# Patient Record
Sex: Male | Born: 1945 | Race: White | Hispanic: No | Marital: Married | Smoking: Former smoker
Health system: Southern US, Community
[De-identification: ages and names within clinical notes are randomized; demographics above are authoritative.]

## PROBLEM LIST (undated history)

## (undated) VITALS — BP 98/65 | HR 87 | Temp 98.8°F | Resp 18 | Ht 70.0 in | Wt 213.1 lb

## (undated) DIAGNOSIS — I471 Supraventricular tachycardia, unspecified: Secondary | ICD-10-CM

## (undated) DIAGNOSIS — Z951 Presence of aortocoronary bypass graft: Secondary | ICD-10-CM

## (undated) DIAGNOSIS — K219 Gastro-esophageal reflux disease without esophagitis: Secondary | ICD-10-CM

## (undated) DIAGNOSIS — I10 Essential (primary) hypertension: Secondary | ICD-10-CM

## (undated) DIAGNOSIS — E66811 Obesity, class 1: Secondary | ICD-10-CM

## (undated) DIAGNOSIS — M65352 Trigger finger, left little finger: Secondary | ICD-10-CM

## (undated) DIAGNOSIS — K4090 Unilateral inguinal hernia, without obstruction or gangrene, not specified as recurrent: Secondary | ICD-10-CM

## (undated) DIAGNOSIS — E669 Obesity, unspecified: Secondary | ICD-10-CM

## (undated) DIAGNOSIS — T82855A Stenosis of coronary artery stent, initial encounter: Secondary | ICD-10-CM

## (undated) DIAGNOSIS — M722 Plantar fascial fibromatosis: Secondary | ICD-10-CM

## (undated) DIAGNOSIS — I5189 Other ill-defined heart diseases: Secondary | ICD-10-CM

## (undated) DIAGNOSIS — I2119 ST elevation (STEMI) myocardial infarction involving other coronary artery of inferior wall: Secondary | ICD-10-CM

## (undated) DIAGNOSIS — I25810 Atherosclerosis of coronary artery bypass graft(s) without angina pectoris: Secondary | ICD-10-CM

## (undated) DIAGNOSIS — Z9861 Coronary angioplasty status: Secondary | ICD-10-CM

## (undated) DIAGNOSIS — K5792 Diverticulitis of intestine, part unspecified, without perforation or abscess without bleeding: Secondary | ICD-10-CM

## (undated) DIAGNOSIS — Z8661 Personal history of infections of the central nervous system: Secondary | ICD-10-CM

## (undated) DIAGNOSIS — E785 Hyperlipidemia, unspecified: Secondary | ICD-10-CM

## (undated) DIAGNOSIS — Z955 Presence of coronary angioplasty implant and graft: Secondary | ICD-10-CM

## (undated) DIAGNOSIS — I2510 Atherosclerotic heart disease of native coronary artery without angina pectoris: Secondary | ICD-10-CM

## (undated) DIAGNOSIS — Z8719 Personal history of other diseases of the digestive system: Secondary | ICD-10-CM

## (undated) HISTORY — DX: Stenosis of coronary artery stent, initial encounter: T82.855A

## (undated) HISTORY — DX: Unilateral inguinal hernia, without obstruction or gangrene, not specified as recurrent: K40.90

## (undated) HISTORY — DX: Hyperlipidemia, unspecified: E78.5

## (undated) HISTORY — PX: TRANSTHORACIC ECHOCARDIOGRAM: SHX275

## (undated) HISTORY — DX: Obesity, unspecified: E66.9

## (undated) HISTORY — PX: HERNIA REPAIR: SHX51

## (undated) HISTORY — DX: Atherosclerosis of coronary artery bypass graft(s) without angina pectoris: I25.810

## (undated) HISTORY — DX: ST elevation (STEMI) myocardial infarction involving other coronary artery of inferior wall: I21.19

## (undated) HISTORY — PX: MOHS SURGERY: SUR867

## (undated) HISTORY — DX: Atherosclerotic heart disease of native coronary artery without angina pectoris: I25.10

## (undated) HISTORY — DX: Obesity, class 1: E66.811

## (undated) HISTORY — DX: Presence of coronary angioplasty implant and graft: Z95.5

## (undated) HISTORY — DX: Personal history of infections of the central nervous system: Z86.61

## (undated) HISTORY — DX: Essential (primary) hypertension: I10

## (undated) HISTORY — DX: Diverticulitis of intestine, part unspecified, without perforation or abscess without bleeding: K57.92

## (undated) HISTORY — DX: Personal history of other diseases of the digestive system: Z87.19

## (undated) HISTORY — DX: Coronary angioplasty status: Z98.61

---

## 1997-09-01 DIAGNOSIS — I2510 Atherosclerotic heart disease of native coronary artery without angina pectoris: Secondary | ICD-10-CM

## 1997-09-01 HISTORY — DX: Atherosclerotic heart disease of native coronary artery without angina pectoris: I25.10

## 2012-04-07 DIAGNOSIS — I2510 Atherosclerotic heart disease of native coronary artery without angina pectoris: Secondary | ICD-10-CM | POA: Insufficient documentation

## 2012-04-07 DIAGNOSIS — I25708 Atherosclerosis of coronary artery bypass graft(s), unspecified, with other forms of angina pectoris: Secondary | ICD-10-CM | POA: Insufficient documentation

## 2012-04-07 DIAGNOSIS — I25110 Atherosclerotic heart disease of native coronary artery with unstable angina pectoris: Secondary | ICD-10-CM | POA: Insufficient documentation

## 2012-04-07 HISTORY — DX: Atherosclerotic heart disease of native coronary artery without angina pectoris: I25.10

## 2012-04-25 ENCOUNTER — Encounter: Payer: Self-pay | Admitting: Cardiology

## 2012-04-25 ENCOUNTER — Encounter (HOSPITAL_COMMUNITY)
Admission: EM | Disposition: A | Discharge status: Home / Self Care | Payer: Self-pay | Source: Ambulatory Visit | Attending: Surgery

## 2012-04-25 ENCOUNTER — Inpatient Hospital Stay (HOSPITAL_COMMUNITY)
Admission: EM | Admit: 2012-04-25 | Discharge: 2012-05-02 | DRG: 232 | Disposition: A | Discharge status: Home / Self Care | Payer: PRIVATE HEALTH INSURANCE | Source: Ambulatory Visit | Attending: Surgery | Admitting: Surgery

## 2012-04-25 ENCOUNTER — Emergency Department (HOSPITAL_COMMUNITY): Admission: EM | Admit: 2012-04-25 | Payer: PRIVATE HEALTH INSURANCE | Source: Home / Self Care | Admitting: Cardiology

## 2012-04-25 ENCOUNTER — Inpatient Hospital Stay (HOSPITAL_COMMUNITY): Payer: PRIVATE HEALTH INSURANCE

## 2012-04-25 DIAGNOSIS — I2119 ST elevation (STEMI) myocardial infarction involving other coronary artery of inferior wall: Secondary | ICD-10-CM

## 2012-04-25 DIAGNOSIS — Z79899 Other long term (current) drug therapy: Secondary | ICD-10-CM

## 2012-04-25 DIAGNOSIS — K5900 Constipation, unspecified: Secondary | ICD-10-CM | POA: Diagnosis not present

## 2012-04-25 DIAGNOSIS — I2510 Atherosclerotic heart disease of native coronary artery without angina pectoris: Secondary | ICD-10-CM

## 2012-04-25 DIAGNOSIS — Y849 Medical procedure, unspecified as the cause of abnormal reaction of the patient, or of later complication, without mention of misadventure at the time of the procedure: Secondary | ICD-10-CM | POA: Diagnosis present

## 2012-04-25 DIAGNOSIS — E8779 Other fluid overload: Secondary | ICD-10-CM | POA: Diagnosis not present

## 2012-04-25 DIAGNOSIS — E785 Hyperlipidemia, unspecified: Secondary | ICD-10-CM | POA: Diagnosis present

## 2012-04-25 DIAGNOSIS — Z7982 Long term (current) use of aspirin: Secondary | ICD-10-CM

## 2012-04-25 DIAGNOSIS — E871 Hypo-osmolality and hyponatremia: Secondary | ICD-10-CM | POA: Diagnosis not present

## 2012-04-25 DIAGNOSIS — Z8719 Personal history of other diseases of the digestive system: Secondary | ICD-10-CM | POA: Insufficient documentation

## 2012-04-25 DIAGNOSIS — T82897A Other specified complication of cardiac prosthetic devices, implants and grafts, initial encounter: Secondary | ICD-10-CM | POA: Diagnosis present

## 2012-04-25 DIAGNOSIS — D696 Thrombocytopenia, unspecified: Secondary | ICD-10-CM | POA: Diagnosis not present

## 2012-04-25 DIAGNOSIS — Z951 Presence of aortocoronary bypass graft: Secondary | ICD-10-CM

## 2012-04-25 DIAGNOSIS — D62 Acute posthemorrhagic anemia: Secondary | ICD-10-CM | POA: Diagnosis not present

## 2012-04-25 HISTORY — DX: Presence of aortocoronary bypass graft: Z95.1

## 2012-04-25 HISTORY — DX: ST elevation (STEMI) myocardial infarction involving other coronary artery of inferior wall: I21.19

## 2012-04-25 HISTORY — PX: LEFT AND RIGHT HEART CATHETERIZATION WITH CORONARY ANGIOGRAM: SHX5449

## 2012-04-25 LAB — HEMOGLOBIN A1C
Hgb A1c MFr Bld: 5.9 % — ABNORMAL HIGH (ref <5.7)
Mean Plasma Glucose: 123 mg/dL — ABNORMAL HIGH (ref <117)

## 2012-04-25 LAB — CBC WITH DIFFERENTIAL/PLATELET
Basophils Relative: 0 % (ref 0–1)
HCT: 37.4 % — ABNORMAL LOW (ref 39.0–52.0)
Hemoglobin: 13.2 g/dL (ref 13.0–17.0)
Lymphocytes Relative: 7 % — ABNORMAL LOW (ref 12–46)
Lymphs Abs: 0.6 10*3/uL — ABNORMAL LOW (ref 0.7–4.0)
Monocytes Relative: 4 % (ref 3–12)
Neutro Abs: 8.6 10*3/uL — ABNORMAL HIGH (ref 1.7–7.7)
Neutrophils Relative %: 89 % — ABNORMAL HIGH (ref 43–77)
RBC: 4.29 MIL/uL (ref 4.22–5.81)
WBC: 9.7 10*3/uL (ref 4.0–10.5)

## 2012-04-25 LAB — MAGNESIUM: Magnesium: 1.7 mg/dL (ref 1.5–2.5)

## 2012-04-25 LAB — COMPREHENSIVE METABOLIC PANEL
Albumin: 3.5 g/dL (ref 3.5–5.2)
Alkaline Phosphatase: 77 U/L (ref 39–117)
BUN: 10 mg/dL (ref 6–23)
CO2: 22 mEq/L (ref 19–32)
Chloride: 90 mEq/L — ABNORMAL LOW (ref 96–112)
GFR calc non Af Amer: >90 mL/min (ref >90)
Glucose, Bld: 137 mg/dL — ABNORMAL HIGH (ref 70–99)
Potassium: 3.6 mEq/L (ref 3.5–5.1)
Total Bilirubin: 0.5 mg/dL (ref 0.3–1.2)

## 2012-04-25 LAB — SURGICAL PCR SCREEN
MRSA, PCR: NEGATIVE
Staphylococcus aureus: NEGATIVE

## 2012-04-25 LAB — URINALYSIS, ROUTINE W REFLEX MICROSCOPIC
Bilirubin Urine: NEGATIVE
Leukocytes, UA: NEGATIVE
Nitrite: NEGATIVE
Protein, ur: NEGATIVE mg/dL

## 2012-04-25 LAB — CARDIAC PANEL(CRET KIN+CKTOT+MB+TROPI): Total CK: 229 U/L (ref 7–232)

## 2012-04-25 SURGERY — LEFT HEART CATHETERIZATION WITH CORONARY ANGIOGRAM

## 2012-04-25 SURGERY — LEFT AND RIGHT HEART CATHETERIZATION WITH CORONARY ANGIOGRAM

## 2012-04-25 SURGERY — LEFT HEART CATHETERIZATION WITH CORONARY ANGIOGRAM
Anesthesia: LOCAL

## 2012-04-25 MED ORDER — POTASSIUM CHLORIDE CRYS ER 20 MEQ PO TBCR
40.0000 meq | EXTENDED_RELEASE_TABLET | Freq: Two times a day (BID) | ORAL | Status: AC
  Administered 2012-04-25 – 2012-04-26 (×3): 40 meq via ORAL
  Filled 2012-04-25 (×3): qty 2

## 2012-04-25 MED ORDER — EPTIFIBATIDE 75 MG/100ML IV SOLN
INTRAVENOUS | Status: AC
  Administered 2012-04-25: 2 ug/kg/min via INTRAVENOUS
  Filled 2012-04-25: qty 100

## 2012-04-25 MED ORDER — ACETAMINOPHEN 325 MG PO TABS: 650.0000 mg | ORAL_TABLET | ORAL | Status: DC | PRN

## 2012-04-25 MED ORDER — FENTANYL CITRATE 0.05 MG/ML IJ SOLN
INTRAMUSCULAR | Status: AC
  Filled 2012-04-25: qty 2

## 2012-04-25 MED ORDER — TRAMADOL HCL 50 MG PO TABS
50.0000 mg | ORAL_TABLET | Freq: Four times a day (QID) | ORAL | Status: DC | PRN
  Filled 2012-04-25: qty 1

## 2012-04-25 MED ORDER — ZOLPIDEM TARTRATE 5 MG PO TABS: 5.0000 mg | ORAL_TABLET | Freq: Every evening | ORAL | Status: DC | PRN

## 2012-04-25 MED ORDER — LIDOCAINE HCL (PF) 1 % IJ SOLN
INTRAMUSCULAR | Status: AC
  Filled 2012-04-25: qty 30

## 2012-04-25 MED ORDER — ATROPINE SULFATE 1 MG/ML IJ SOLN
INTRAMUSCULAR | Status: AC
  Filled 2012-04-25: qty 1

## 2012-04-25 MED ORDER — SODIUM CHLORIDE 0.9 % IV SOLN
INTRAVENOUS | Status: AC
  Administered 2012-04-25: 14:00:00 via INTRAVENOUS

## 2012-04-25 MED ORDER — EPTIFIBATIDE 75 MG/100ML IV SOLN
2.0000 ug/kg/min | INTRAVENOUS | Status: DC
  Administered 2012-04-25 – 2012-04-27 (×8): 2 ug/kg/min via INTRAVENOUS
  Filled 2012-04-25 (×21): qty 100

## 2012-04-25 MED ORDER — SODIUM CHLORIDE 0.9 % IJ SOLN
3.0000 mL | Freq: Two times a day (BID) | INTRAMUSCULAR | Status: DC
  Administered 2012-04-27 (×2): 3 mL via INTRAVENOUS

## 2012-04-25 MED ORDER — MIDAZOLAM HCL 2 MG/2ML IJ SOLN
INTRAMUSCULAR | Status: AC
  Filled 2012-04-25: qty 2

## 2012-04-25 MED ORDER — ATORVASTATIN CALCIUM 80 MG PO TABS
80.0000 mg | ORAL_TABLET | Freq: Every day | ORAL | Status: DC
  Administered 2012-04-25 – 2012-04-27 (×3): 80 mg via ORAL
  Filled 2012-04-25 (×4): qty 1

## 2012-04-25 MED ORDER — ASPIRIN EC 325 MG PO TBEC
325.0000 mg | DELAYED_RELEASE_TABLET | Freq: Every day | ORAL | Status: DC
  Administered 2012-04-25 – 2012-04-27 (×3): 325 mg via ORAL
  Filled 2012-04-25 (×4): qty 1

## 2012-04-25 MED ORDER — VERAPAMIL HCL 2.5 MG/ML IV SOLN
INTRAVENOUS | Status: AC
  Filled 2012-04-25: qty 2

## 2012-04-25 MED ORDER — MORPHINE SULFATE 2 MG/ML IJ SOLN: 2.0000 mg | INTRAMUSCULAR | Status: DC | PRN

## 2012-04-25 MED ORDER — HEPARIN (PORCINE) IN NACL 100-0.45 UNIT/ML-% IJ SOLN
1400.0000 [IU]/h | INTRAMUSCULAR | Status: DC
  Administered 2012-04-26: 1100 [IU]/h via INTRAVENOUS
  Filled 2012-04-25 (×2): qty 250

## 2012-04-25 MED ORDER — SODIUM CHLORIDE 0.9 % IV SOLN
250.0000 mL | INTRAVENOUS | Status: DC | PRN
  Administered 2012-04-25: 23:00:00 via INTRAVENOUS

## 2012-04-25 MED ORDER — ALPRAZOLAM 0.25 MG PO TABS
0.2500 mg | ORAL_TABLET | Freq: Three times a day (TID) | ORAL | Status: DC | PRN
  Administered 2012-04-25 – 2012-04-26 (×2): 0.25 mg via ORAL
  Filled 2012-04-25 (×2): qty 1

## 2012-04-25 MED ORDER — ONDANSETRON HCL 4 MG/2ML IJ SOLN: 4.0000 mg | Freq: Four times a day (QID) | INTRAMUSCULAR | Status: DC | PRN

## 2012-04-25 MED ORDER — SODIUM CHLORIDE 0.9 % IJ SOLN: 3.0000 mL | INTRAMUSCULAR | Status: DC | PRN

## 2012-04-25 MED ORDER — HEPARIN (PORCINE) IN NACL 2-0.9 UNIT/ML-% IJ SOLN
INTRAMUSCULAR | Status: AC
  Filled 2012-04-25: qty 2000

## 2012-04-25 MED ORDER — NITROGLYCERIN 0.2 MG/ML ON CALL CATH LAB
INTRAVENOUS | Status: AC
  Filled 2012-04-25: qty 1

## 2012-04-25 MED ORDER — BIVALIRUDIN 250 MG IV SOLR
INTRAVENOUS | Status: AC
  Filled 2012-04-25: qty 250

## 2012-04-25 NOTE — H&P (Addendum)
This is a duplicated note.   Other note completed & signed.   Exam: General appearance: alert, cooperative, appears stated age and moderate distress Neck: no adenopathy, no carotid bruit, no JVD, supple, symmetrical, trachea midline and thyroid not enlarged, symmetric, no tenderness/mass/nodules Lungs: CTAB, non-labored Heart: regular rate and rhythm, S1, S2 normal, no murmur, click, rub or gallop Abdomen: soft, non-tender; bowel sounds normal; no masses,  no organomegaly Extremities: extremities normal, atraumatic, no cyanosis or edema Pulses: 2+ and symmetric Neurologic: Grossly normal   Karelly Dewalt W, M.D., M.S. [facility] [address] [place], [place]  [address]  [phone] Pager # [phone]  04/25/2012 3:56 PM

## 2012-04-25 NOTE — H&P (Signed)
I saw & examined the patient prior to emergent cardiac catheterization.  See 2nd H&P for completed note. Marykay Lex, M.D., M.S. [facility] [address] [place], [place]  [address]  [phone] Pager # [phone]  04/25/2012 3:51 PM

## 2012-04-25 NOTE — Progress Notes (Signed)
Pt 6 fr sheath pulled per protocol @ 1625. Pressure applied X 40 min. Dsg CDI. Level O. Education provided to patient & family with verbalization & demonstration of understanding. Will monitor per protocol. VVS 115/85 HR 59 99% on RA, RR- 14.

## 2012-04-25 NOTE — CV Procedure (Signed)
[facility] PERCUTANEOUS CORONARY INTERVENTION REPORT  NAME:  Jonathan Escobar   MRN: [id number] DOB:  [birth year]   ADMIT DATE: 04/25/2012  INTERVENTIONAL CARDIOLOGIST: Marykay Lex, M.D., MS PRIMARY CARE PROVIDER: Sheila Oats, MD PRIMARY CARDIOLOGIST:   PATIENT:  Jonathan Escobar is a 66 y.o. 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 Due to in-stent restenosis he was treated with brachytherapy nearly 2000s, and subsequently underwent DES placement of the stent in-stent in 2005. All these catheterization were done in [place] He had been on Plavix for roughly 2 years but was discontinued due to a GI bleed with an clear etiology despite extensive workup. His usual state of health until this morning at roughly 1120 hours when he called EMS after onset of substernal chest pressure 8-10 / 10 not relieved by nitroglycerin or for the aspirin. EMS arrived roughly 20 minutes after onset of pain and ECG demonstrated 1-2 mm ST elevations in leads 23 and aVF with reciprocal changes consistent inferior MI. His pain was somewhat reduced after doses of nitroglycerin along with morphine were measured while in the ambulance. He brought emergently to cardiac catheterization lab for emergency cardiac catheterization/PCI. Emergency verbal consent was obtained after the procedure was explained in great detail with risks benefits alternatives and indications.  PRE-OPERATIVE DIAGNOSIS:    Inferior ST elevation MI  Prior PCI to the RCA  PROCEDURES PERFORMED:    Left Heart Catheterization via 6 Fr Right Common Femoral Artery access after failed radial artery access due to vasospasm in the right radial artery  Left Ventriculography, (RAO) 12 ml/sec for 25 ml total contrast  Native Coronary Angiography  Aspiration Thrombectomy of the proximal RCA  Percutaneous Coronary Balloon Angioplasty only of 2 sequential  sites of in-stent restenosis/thrombosis in the proximal to mid RCA stent, with restoration of TIMI-3 flow and resolution of chest pain /ECG changes.  PROCEDURE: Consent:  Risks of procedure as well as the alternatives and risks of each were explained to the patient.  Consent for procedure obtained. and Unable to obtain consent because of emergent medical necessity.  PROCEDURE: The patient was brought to the 2nd Floor [facility] in the fasting state and prepped and draped in the usual sterile fashion for Right radial and groin access. Sterile technique was used including antiseptics, cap, gloves, gown, hand hygiene, mask and sheet.  Skin prep: Chlorhexidine.  A modified Allen's test with plethysmography was performed on the right wrist demonstrating adequate Ulnar Artery collateral flow for Right Radial Artery Access.  Time Out: Verified patient identification, verified procedure, site/side was marked, verified correct patient position, special equipment/implants available, medications/allergies/relevent history reviewed, required imaging and test results available.  Performed.  Reason for Delayed Access:  Failed radial artery access. The right wrist was anesthetized with 1% subcutaneous Lidocaine.  The right radial artery was accessed using the Seldinger Technique with the glide sheath Angiocath with brisk flow initially upon removal of the needle; however several attempts the pass the wire into the radial artery were unsuccessful due to vasospasm. After initial repositioning of the Angiocath with another attempt the wire being unsuccessful, the decision made to abort Radial Artery access and revert to Right Common Femoral Artery access.  This led to delay to access by less than 5 minutes.  Access: Right Common Femoral Artery; 6 Fr Sheath Diagnostic Left Heart Catheterization:  Catheter advanced exchanged over a standard J-wire.  Left Coronary Artery Angiography: 5 French  JL4  Right Coronary Artery Angiography: 6 French JR 4 guide  LV Hemodynamics (LV Gram): 5 French angled Pigtail catheter; exchanged following PTCA.  Hemodynamics:  Central Aortic / Mean Pressures: 100/60 mmHg  Left Ventricular Pressures / EDP: 99/6 mmHg; 20 mmHg  Left Ventriculography:  EF: Roughly 50 %  Wall Motion: Mild mid inferior and anterolateral hypokinesis  Coronary Anatomy:  Left Main: Large caliber vessel bifurcates into the LAD and Circumflex major branches along with a small Ramus Intermedius. LAD: The vessel begins a large caliber vessel that tapers to about 40% just prior to the takeoff of the major first diagonal branch (D1), after this branch there is a focal 70% lesion followed by a short poststenotic ectatic segment and then a second focal 70% lesion.  The remainder the vessel has minimal luminal irregularities with TIMI-3 flow.  D1: Moderate caliber vessel with no significant disease; it is not involved with the  Due to the abnormal nature the LAD lesion, additional angiographic images were performed, leading to a less than 1 minute delay to Right Coronary Artery angiography. Left Circumflex: Moderate caliber vessel, gives off 2 small marginal branches before having a tubular 50% lesion in the mid vessel prior to bifurcating into a lateral OM branch and the AV groove circumflex that gives off 2 more branches, one of them the distal OM and the other bifurcates into 2 small posterior lateral branches.  OM1: Small moderate caliber vessel covering a relatively large distribution, angiographically normal  OM 2: Small vessel, minimal luminal irregularities Ramus intermedius: Small to moderate caliber vessel that covers are sure portion of the anterolateral wall, tapers into a small vessel that is relatively insignificant at the proximal segment the  RCA: Large caliber dominant vessel with a proximal stent that to its entirety has 20+ % in-stent restenosis with a focal  point in the mid stent right at the take off of the marginal branch where there is a 95% stenosis, the vessel is then 100% occluded at the distal edge of the stent with Promus in the intervening segment between the mid stent lesion and the occlusion site. Post Thrombectomy and Angioplasty:  RPDA: Small moderate caliber vessel with minimal luminal irregularities  RPL Sysytem:The Right Posterior AV Groove Branch begins as a moderate caliber vessel that branches shortly into 2 posterior lateral branches, there is a tubular 70% lesion in RPL 2.  After the initial cineangiographic view of the occluded RCA, this is determined to be the culprit vessel. The guide catheter was already in place, and the Angiomax bolus been ordered and administered an ACT of greater than 200 sec at this point.  Percutaneous Coronary Intervention:  With the plan to proceed with PTCA only followed by CABG as the best option, a thienopyridine/antiplatelet agent was not administered.  Integrilin double bolus was administered followed by a drip at a standard rate. Guide: 6 Fr   JR 4 Guidewire: BMW  Pronto V4 AspirationThrombectomy Catheter: 1 pass made to the initial in-stent restenosis lesion but unable pass beyond that.  Despite this, the patient had notable sinus bradycardia into the 30 bpm range requiring administration of 1 mg Atropine.  This was consistent with reperfusion. Post thrombectomy angiography after Intracoronary Nitroglycerin injection demonstrated restoration of TIMI-3 flow throughout the RCA and downstream. The patient at this point was pain-free, and the ECG changes were back to baseline.  Predilation Balloon: Sprinter Legend 2.5 mm x 15 mm;   8 Atm x  35 Sec -- at the distal edge of the stent where the original occlusion site was  10 Atm x 35 Sec -- in the mid stent 95% ISR site. At this point and Angio-Sculpt balloon was used but was unable to pass beyond the proximal portion of the stent.   Therefore, a  noncompliant balloon was chosen to treat the in-stent restenosis.  Balloon #2: [place] trek 3.0 mm x 8 mm; multiple inflations were made throughout the entire stented segment  12 Atm x 30 Sec x 2 separate inflations at either end of the stent,   14 Atm x 33 Sec - just distal to the ISR site   18 Atm x 45 Sec x 2 -- in overlapping fashion, covering the most significant consent restenosis site.   Balloon #3: [place] Quantum Apex 3.5  mm x 12  mm; at the most significant mid stent ISR site  14  Atm x 60  Sec -- with reduction of the 95% stenosis to roughly 60-70% stenosis  At this point there was concern for possible dissection at the distal PTCA site beyond the stent. The decision was made to use long inflations of a compliant balloon with the attempt to potentially "tack "the dissection down.  Balloon #4: Trek 3.0 mm x 20 mm    12 Atm x 90 Sec x 2; reduction of the 100% original stenosis to roughly 40%  Post-angioplasty cineangiography with and without guidewire in place was performed in multiple orthogonal views demonstrating no evidence of dissection or perforation with excellent TIMI-3 flow distally and residual lesions as noted above.  It was determined that was positive potential dissection was actually a branch vessel that was now becoming apparent right parallel to the parent vessel.  ANESTHESIA:   Local Lidocaine 8 ml SEDATION:  4mg  IV Versed, 75  mcg IV fentanyl ; MEDICATIONS:  Intracoronary Nitroglycerin 200 mcg; Atropine 1 mg   Anticoagulation: Angiomax bolus and drip , discontinued upon wire removal   Anti-Platelet Agent: Integrilin Double Bolus and Drip to be continued until CABG   EBL:   < 10 ml  PATIENT DISPOSITION:    The patient was transferred to the CCU in a hemodynamicaly stable, chest pain free condition.  The patient tolerated the procedure well, and there were no complications.  The patient was stable before, during, and after the procedure.  POST-OPERATIVE DIAGNOSIS:                                          Severe two-vessel Coronary Artery Disease: 1. Culprit lesion for the Inferior ST elevation MI being the 100% thrombotic occlusion at the distal edge of the proximal to mid RCA drug eluding stent placed in  with the 2005, along with severe 95% in-stent re-stenosis in the middle portion of the stented segment.  Additionally, roughly 60-70%ubularar lesion of a significant RPL 2. 2. Early mid LAD tandem roughly 70% lesions with intervening short segment of ectasia, with excellent downstream target.  This lesion is a difficult PCI target as it would involve jailing the major diagonal branch not otherwise involved with the lesion in the LAD.  The presence of the LAD lesion there in of itself is not actually favorable PCI target given his proximal location with possible jailing a major diagonal branch.  With the patient's history of in-stent restenosis and now thrombosis, a proximal LAD stent thrombosis within  the potentially catastrophic.  Successful 2-site PTCA preceded by aspiration thrombectomy resulting in roughly 40% tubular stenosis at the 100% occlusion site, and 70% focal stenosis in the mid stent ISR segment despite aggressive noncompliant balloon angioplasty.  With restoration of TIMI-3 flow and 100% resolution of pain and ECG changes.  There was extreme difficulty in dilating the mid stent in-stent re-stenosis that may well be the lesion treated with brachytherapy and redo PCI, along with now there is also a lesion distal to the stent that was he occlusion site as well as a lesion in the right posterior lateral branch.    Relatively preserved Left Ventricular Ejection Fraction of ~50 % with normal pressures.  Prior difficulties within stent stenosis in the RCA, indicating less than favorable outcomes with stenting for this patient.  Arrival the Cath Lab -to- access time was delayed due to failed attempted radial artery access, after roughly 4 minutes was  used.  PLAN OF CARE:  Based on thie difficulty of successful PTCA of the RCA and the LAD lesion, I believe the most effective means route of complete revascularization is coronary artery bypass grafting of the LAD with the LIMA along with the vein or arterial to the RCA system  (likely sequential to the RPDA and RPL).    Dr. Tyrone Sage from Cardiac Surgery has agreed to see the patient in consultation.  Based on the residual RCA lesion, the plan would be to proceed with CABG during this hospitalization.   I will continue intravenous Integrilin and will reinitiate heparin 6 hours post sheath pull the plan to continue these effusions for the duration of his position prior to CABG.  Careful institution of beta blocker therapy once his heart rate and blood pressure stabilized post PCI.  He was on a calcium channel blocker for SVT.  High-dose statin short-term then reduced to his home dose on discharge.  Continue aspirin but no other oral antiplatelet agent.   Marykay Lex, M.D., M.S. [facility] [address] [place], [place]  [address]  [phone]  04/25/2012 8:47 PM

## 2012-04-25 NOTE — H&P (Signed)
PLEASE NOTE THIS IS second attempt in placing H&P first done at 1246 pm on arrival to cath lab and was cancelled by the ER, though the patient was on the cath lab table and being admitted.  Jonathan Escobar is an 66 y.o. male.   Chief Complaint: chest pain   HPI: 66 yr. Old WM with history of CAD and prior stent to RCA, followed by brachytherapy and ultimately DES stent. Has had to stop plavix secondary to GI bleed. These caths were done in [facility].  In 1999 and 2005.  He was kept on Plavix an extended time and developed a GI bleed, never able to determine the source despite work up and capsule endoscopy.       Presents today with chest pain that began at 1120 am today. EMS called and EKG revealed ST elevation to INF. Leads. 3 baby ASA at home, and his NTG were no longer in date and did not help pain. He has rec'd of fentanyl by EMS. On arrival to cath lab, pain 8/10. Now with Versed and Fentanyl on board he has less pain. Emergently to Lab for emergent cath.    Past Medical History   Diagnosis  Date   .  CAD (coronary artery disease), history of RCA stent  04/25/2012    Has had hernia repair Viral meningitis after episode of GI Bleed where he rec'd 2 units of PRBCs  Family history  Father died of CHF at 41 but did have CABG earlier, Mother died of Alzheimer's disease, a sister with MR and need for surgery.    Social History: Married with 3 children and 2 GC, stopped tobacco in 1978.  Drinks 2 glasses of wine daily. 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      Allergies:  Allergies   Allergen  Reactions   .  Amoxicillin  Rash    Outpatient Medications:  ASA 81 mg daily  Lipitor 10 mg po daily  Vit C 2 other meds will add once pt off cath table  cardizem 120 mg daily prilosec   Labs pending   ROS: General: no colds or fevers, feeling well until today Skin: no rashes or ulcers HEENT:no blurred vision CV:+ chest pain PUL:no SOB GI:no recent diarrhea, constipation or  melena, though hx of GI bleed GU:no hematuria MS:no joint pain Neuro: no syncope Endo: no diabetes, no thyroid disease  VS in  2900  BP 111/76 P 56 Resp 16 T 97.6  Sp02 97% PE: General:alert and oriented X 3, MAE, pleasant affect in obvious pain  Skin:warm and clammy  HEENT:normocephalic, glasses in place  Neck:  Heart:  Lungs:  Abd:  Ext:  Neuro:alert and oriented X 3, MAE follows commands   Assessment/Plan  Principal Problem:  *STEMI (ST elevation myocardial infarction)  CAD with previous stent to RCA  H/O GI bleed    PLAN: Emergently to cath table for cath and intervention.  Abiel Antrim R  04/25/2012, 12:45 PM

## 2012-04-25 NOTE — H&P (Addendum)
Jonathan Escobar is an 66 y.o. male.   Chief Complaint: chest pain HPI: 66 yr. Old WM with history of CAD and prior stent to RCA, followed by brachytherapy and ultimately DES stent.  Has had to stop plavix secondary to GI bleed.  These caths were done in [facility].  Presents today with chest pain that began at 1120 am today.  EMS called and EKG revealed  ST elevation to INF. Leads.  3 baby ASA at home, and his NTG were no longer in date and did not help pain.  He has rec'd of fentanyl by EMS.  On arrival to cath lab, pain 8/10.  Now with Versed and Fentanyl on board he has less pain.  Emergently to Lab for emergent cath.        Past Medical History  Diagnosis Date  . CAD (coronary artery disease), history of RCA stent 04/25/2012    No past surgical history on file.  No family history on file. Social History:  does not have a smoking history on file. He does not have any smokeless tobacco history on file. His alcohol and drug histories not on file.  Allergies:  Allergies  Allergen Reactions  . Amoxicillin Rash    Outpatient Medications: ASA 81 mg daily Lipitor 10 mg po daily Vit C  2 other meds will add once pt off cath table   Labs pending  ROS: General: Skin: HEENT: CV: PUL: GI: GU: MS: Neuro: Endo:   There were no vitals taken for this visit. PE: General:alert and oriented X 3, MAE, pleasant affect in obvious pain Neck: no adenopathy, no carotid bruit, no JVD, supple, symmetrical, trachea midline and thyroid not enlarged, symmetric, no tenderness/mass/nodules Lungs: clear to auscultation bilaterally, normal percussion bilaterally and non-labored Heart: regular rate and rhythm, S1, S2 normal, no S3 or S4 and borderline bradycardic; no M/R/G Abdomen: soft, non-tender; bowel sounds normal; no masses,  no organomegaly Extremities: extremities normal, atraumatic, no cyanosis or edema Pulses: 2+ and symmetric Skin: Skin color, texture, turgor normal. No rashes or  lesions Neuro:alert and oriented X 3, MAE follows commands    Assessment/Plan Principal Problem:  *STEMI (ST elevation myocardial infarction) - Inferior Lead (~35mm II, III, aVF CAD with previous stent to RCA (BMS 1999  -- brachytherapy early 2000s -- DES PCI in 2005) H/O GI bleed  PLAN: Emergently to cath table for cath and intervention.  INGOLD,LAURA R 04/25/2012, 12:45 PM  ATTENDING ATTESTATION:  I have seen and examined the patient along with Nada Boozer, NP.  I have reviewed the chart, notes and new data.  I agree with Laura's findings, examination & recommendations as noted above.  Brief Description: 66 y/o man with prior CAD - RCA PCI as noted presenting with Inferior STEMI. CP began ~20 min prior to initial EMS ECG.  PLAN:  Emergent LHC +/- PCI  Further plan per cath note.  Marykay Lex, M.D., M.S. [facility] [address] [place], [place]  [address]  [phone]  04/25/2012 3:54 PM

## 2012-04-25 NOTE — Progress Notes (Signed)
ANTICOAGULATION CONSULT NOTE - Initial Consult  Pharmacy Consult for Heparin, Integrillin Indication: STEMI  Allergies  Allergen Reactions  . Amoxicillin Rash    Flu like symptoms    Patient Measurements: Height: 5\' 10"  (177.8 cm) Weight: 216 lb 0.8 oz (98 kg) IBW/kg (Calculated) : 73  Heparin Dosing Weight: 93Kg  Vital Signs: Temp: 97.6 F (36.4 C) (08/25 1415) Temp src: Oral (08/25 1415) BP: 111/76 mmHg (08/25 1445) Pulse Rate: 56  (08/25 1445)  Labs:  Basename 04/25/12 1515  HGB 13.2  HCT 37.4*  PLT 214  APTT --  LABPROT --  INR --  HEPARINUNFRC --  CREATININE 0.65  CKTOTAL 229  CKMB 14.8*  TROPONINI 3.54*    CrCl is unknown because no creatinine reading has been taken.   Medical History: Past Medical History  Diagnosis Date  . CAD (coronary artery disease), history of RCA stent 04/25/2012  . Hypertension   . Viral meningitis      after GI bleed  . GI bleed     several years ago     Medications:  - med rec pending  Assessment: Mr. Gehlhausen has known CAD-DES stent to RCA, admitted today for STEMI. He is s/p femoral cath, with thrombectomy of RCA & PTCA (100% post-stent stenosis and mid-stent in-stent restenosis). Noted CVTS consult for CABG. Received orders to continue integrillin until CABG/sx and to start heparin 8h after sheath out (sheath currently in place).  Noted SCr is 0.65mg /dl, GFR ~ 161; H/H and plts are WNL at baseline. Noted plans to remove sheath now.  Goal of Therapy:  Heparin level 0.3-0.5 units/ml Monitor platelets by anticoagulation protocol: Yes   Plan:  - Continue Integrillin at 98mcg/kg/min - Will plan to start Heparin at 0030 8/26 at 1100 units/hr - Check heparin level and CBC at 0600 8/26 - Daily heparin level and CBC thereafter  Thanks, Mickie Badders K. Allena Katz, PharmD, BCPS.  Clinical Pharmacist Pager [phone]. 04/25/2012 4:17 PM

## 2012-04-25 NOTE — Consult Note (Signed)
[address]            [place] [address]          [phone]       Julez Huseby [facility] Medical Record #[id number] Date of Birth: 10-Apr-1946  Referring: Marykay Lex, MD Primary Care: Sheila Oats, MD  Chief Complaint:  New Onset of Chest Pain/ inferior stemi/direct to cath lab   History of Present Illness:     66 yr. Male  with history of CAD and prior stent to RCA in 1999 bare metal, followed by brachytherapy and ultimately DES stent in 2005. He was on plavix but had to stop secondary to GI bleed in 2007. Previous caths done in [place].   He was kept on Plavix an extended time and developed a GI bleed, never able to determine the source despite work up and capsule endoscopy.  Presents today with chest pain that began at 1120 am today. EMS called and EKG revealed ST elevation to INF. Leads. 3 baby ASA at home, and his NTG were no longer in date and did not help pain. He has rec'd of fentanyl by EMS. On arrival to cath lab, pain 8/10. Now after angioplasty of right with vessel open he is without  Pain.  He is on integrelin , was given angiomax, did not receive Plavix.  Lab Results  Component Value Date   CKTOTAL 229 04/25/2012   CKMB 14.8* 04/25/2012   TROPONINI 3.54* 04/25/2012      Current Activity/ Functional Status: Patient is independent with mobility/ambulation, transfers, ADL's, IADL's.   Past Medical History  Diagnosis Date  . CAD (coronary artery disease), history of RCA stent 04/25/2012  . Hypertension   . Viral meningitis      after GI bleed  . GI bleed     several years ago  Evaluated in [facility] upper endoscopy and colonoscopy and capsule endoscopy no bleeding site found    Past Surgical History  Procedure Date  . Hernia repair     remotely    History  Smoking status  . Former Smoker  . Quit date: 09/01/1976  Smokeless tobacco  . Never Used    History  Alcohol Use  . 8.4 oz/week  . 14 Glasses of wine  per week    History   Social History  . Marital Status: married    Spouse Name: N/A    Number of Children: 2 daughters   . Years of Education: Trained as PA   Occupational History  . 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   Social History Main Topics  . Smoking status: Former Smoker    Quit date: 09/01/1976  . Smokeless tobacco: Never Used  . Alcohol Use: 8.4 oz/week    14 Glasses of wine per week  . Drug Use: No  . Sexually Active: Yes   Other Topics Concern  . Not on file   Social History Narrative  . No narrative on file    Allergies  Allergen Reactions  . Amoxicillin Rash    Flu like symptoms    Current Facility-Administered Medications  Medication Dose Route Frequency Provider Last Rate Last Dose  . 0.9 %  sodium chloride infusion  250 mL Intravenous PRN Marykay Lex, MD      . 0.9 %  sodium chloride  infusion   Intravenous Continuous Marykay Lex, MD 100 mL/hr at 04/25/12 1400    . acetaminophen (TYLENOL) tablet 650 mg  650 mg Oral Q4H PRN Marykay Lex, MD      . aspirin EC tablet 325 mg  325 mg Oral Daily Marykay Lex, MD   325 mg at 04/25/12 1808  . atorvastatin (LIPITOR) tablet 80 mg  80 mg Oral q1800 Marykay Lex, MD   80 mg at 04/25/12 1808  . atropine 1 MG/ML injection           . bivalirudin (ANGIOMAX) 250 MG injection           . eptifibatide (INTEGRILIN) 75 mg / 100 mL infusion           . eptifibatide (INTEGRILIN) 75 mg / 100 mL infusion  2 mcg/kg/min Intravenous Continuous Marykay Lex, MD 15.7 mL/hr at 04/25/12 1400 2 mcg/kg/min at 04/25/12 1400  . fentaNYL (SUBLIMAZE) 0.05 MG/ML injection           . heparin 2-0.9 UNIT/ML-% infusion           . heparin ADULT infusion 100 units/mL (25000 units/250 mL)  1,100 Units/hr Intravenous Continuous Meera K Patel, PHARMD      . lidocaine (XYLOCAINE) 1 % injection           . midazolam (VERSED) 2 MG/2ML injection           . midazolam (VERSED) 2 MG/2ML  injection           . morphine 2 MG/ML injection 2 mg  2 mg Intravenous Q1H PRN Marykay Lex, MD      . nitroGLYCERIN (NTG ON-CALL) 0.2 mg/mL injection           . ondansetron (ZOFRAN) injection 4 mg  4 mg Intravenous Q6H PRN Marykay Lex, MD      . potassium chloride SA (K-DUR,KLOR-CON) CR tablet 40 mEq  40 mEq Oral BID Marykay Lex, MD   40 mEq at 04/25/12 1630  . sodium chloride 0.9 % injection 3 mL  3 mL Intravenous Q12H Marykay Lex, MD      . sodium chloride 0.9 % injection 3 mL  3 mL Intravenous PRN Marykay Lex, MD      . verapamil (ISOPTIN) 2.5 MG/ML injection           . DISCONTD: atropine 1 MG/ML injection             Prescriptions prior to admission  Medication Sig Dispense Refill  . Ascorbic Acid (VITAMIN C) 1000 MG tablet Take 1,000 mg by mouth daily.      Marland Kitchen aspirin EC 81 MG tablet Take 81 mg by mouth daily.      Marland Kitchen aspirin EC 81 MG tablet Take 162 mg by mouth once.      Marland Kitchen atorvastatin (LIPITOR) 10 MG tablet Take 10 mg by mouth at bedtime.      . cetirizine (ZYRTEC) 10 MG tablet Take 10 mg by mouth daily.      Marland Kitchen diltiazem (CARDIZEM CD) 120 MG 24 hr capsule Take 240 mg by mouth at bedtime.      . Multiple Vitamin (MULTIVITAMIN WITH MINERALS) TABS Take 1 tablet by mouth daily.      Marland Kitchen omeprazole (PRILOSEC) 20 MG capsule Take 20 mg by mouth at bedtime.      Marland Kitchen OVER THE COUNTER MEDICATION Take 1 capsule by mouth daily. Medication:MegaRed Omega-3 Krill Oil      .  Psyllium (METAMUCIL PO) Take by mouth daily. Takes 1 teaspoon of Metamucil daily        Family History  Problem Relation Age of Onset  . Alzheimer's disease Mother   . Heart failure Father Died 11  cabg at 63  . Coronary artery disease Father   . Valvular heart disease Sister Severe MR     Review of Systems:     Cardiac Review of Systems: Y or N  Chest Pain [ y   ]  Resting SOB [ n  ] Exertional SOB  Cove.Etienne  ]  Orthopnea [  ]   Pedal Edema [ n  ]    Palpitations [ history rapid rate ] Syncope  [ n ]     Presyncope [ n  ]  General Review of Systems: [Y] = yes [  ]=no Constitional: recent weight change [  ]; anorexia [  ]; fatigue [  ]; nausea [  ]; night sweats [  ]; fever [  ]; or chills [  ];                                                                                                                                          Dental: poor dentition[ n ]; Last Dentist visit:   Eye : blurred vision [  ]; diplopia [   ]; vision changes [  ];  Amaurosis fugax[  ]; Resp: cough [  ];  wheezing[  ];  hemoptysis[  ]; shortness of breath[  ]; paroxysmal nocturnal dyspnea[  ]; dyspnea on exertion[  ]; or orthopnea[  ];  GI:  gallstones[  ], vomiting[  ];  dysphagia[  ]; melena[ y ];  hematochezia [  ]; heartburn[  ];   Hx of  Colonoscopy[ y ]; GU: kidney stones [  ]; hematuria[ n ];   dysuria [  ];  nocturia[  ];  history of     obstruction [  ];             Skin: rash, swelling[  ];, hair loss[  ];  peripheral edema[  ];  or itching[  ]; Musculosketetal: myalgias[  ];  joint swelling[  ];  joint erythema[  ];  joint pain[  ];  back pain[  ];  Heme/Lymph: bruising[  ];  bleeding[  ];  anemia[  ];  Neuro: TIA[n  ];  headaches[  ];  stroke[  ];  vertigo[  ];  seizures[  ];   paresthesias[  ];  difficulty walking[n  ];  Psych:depression[  ]; anxiety[  ];  Endocrine: diabetes[  ];  thyroid dysfunction[  ];  Immunizations: Flu Cove.Etienne  ]; Pneumococcal[ y ];  Other:  Physical Exam: BP 129/79  Pulse 63  Temp 98.2 F (36.8 C) (Oral)  Resp 21  Ht 5\' 10"  (1.778 m)  Wt 216 lb 0.8 oz (98  kg)  BMI 31.00 kg/m2  SpO2 98%  General appearance: alert, cooperative, appears older than stated age and no distress Neurologic: intact Heart: regular rate and rhythm, S1, S2 normal, no murmur, click, rub or gallop and normal apical impulse Lungs: clear to auscultation bilaterally and normal percussion bilaterally Abdomen: soft, non-tender; bowel sounds normal; no masses,  no organomegaly Extremities: extremities normal,  atraumatic, no cyanosis or edema and Homans sign is negative, no sign of DVT no carotid bruits, no cervical adenopathy   Diagnostic Studies & Laboratory data:     Recent Radiology Findings:   Dg Chest Port 1 View  04/25/2012  *RADIOLOGY REPORT*  Clinical Data: Post cardiac cath  PORTABLE CHEST - 1 VIEW  Comparison: None.  Findings: Lungs are essentially clear. No pleural effusion or pneumothorax.  Mild prominence of the right paratracheal and left suprahilar regions, likely vascular.  The heart is top normal in size.  IMPRESSION: No evidence of acute cardiopulmonary disease.   Original Report Authenticated By: Charline Bills, M.D.       Recent Lab Findings: Lab Results  Component Value Date   WBC 9.7 04/25/2012   HGB 13.2 04/25/2012   HCT 37.4* 04/25/2012   PLT 214 04/25/2012   GLUCOSE 137* 04/25/2012   ALT 26 04/25/2012   AST 28 04/25/2012   NA 136 04/25/2012   K 3.6 04/25/2012   CL 90* 04/25/2012   CREATININE 0.65 04/25/2012   BUN 10 04/25/2012   CO2 22 04/25/2012   Lab Results  Component Value Date   CKTOTAL 229 04/25/2012   CKMB 14.8* 04/25/2012   TROPONINI 3.54* 04/25/2012   CATH/Angioplasty:  PRE-OPERATIVE DIAGNOSIS: Inferioor stemi  POST-OPERATIVE DIAGNOSIS:  Culprit lesions: 100% post-stent stenosis with 95% mid stent ISR (proximal to occlusion) followed by ~70% RPL2 stenosis  S/p Successful Aspiration thrombectomy of proximal RCA & PTCA of both RCA lesions.  PTCA throughout stented segment ~20% ostial LM LAD - tapers to D1 that is large & angiographically normal; just after D1 - ~80% irregular lesion with short segment of post stenostic ectatic dilation then another~70% focal lesion with minimal irregularities downstream.  LCx - ~50% tubular lesion in mid vessel before bifurcating into OM1 and AVGroove vessel that terminates as 2 small LPL branches.  EF ~50-55% with mild mid Inferior & anterolateral hypokinesis PROCEDURE: Procedure(s) (LRB):  LEFT AND RIGHT HEART  CATHETERIZATION WITH CORONARY ANGIOGRAM (N/A) - RFA after R Radial spasm led to inability to place sheath.  Aspiration Thrombectomy of proximal RCA  Balloon Angioplasty of both lesion sites in RCA  SURGEON: Surgeon(s) and Role:  * Marykay Lex, MD - Primary  ANESTHESIA: local and IV sedation (4mg  Versed; 75 mcg Fentanuyl)  EBL: < 10 ml  DRAINS: 6Fr RFA sheat - JL4 - LCA; JR 4 Guide - RCA, angled pigtail post PCI  PCI: Wire- BMS; Integrelin & Angiomax; Pronto V4 Thrombectomy; multiple balloons (see final report)  LOCAL MEDICATIONS USED: LIDOCAINE  DICTATION: .Note written in EPIC  PLAN OF CARE: Admit to inpatient ; CVTS consulted.  Continue ASA, Integrelin & restart Heparin in 8 hr post sheath removal.3  BB (if BP & HR tolerate)  Statin.  PATIENT DISPOSITION: ICU - extubated and stable. Angian Free.    Assessment / Plan:    Inferior STEMI, with angioplasty of stented RCA with diseased RCA and LAD I have recommended that we proceed with CABG on this admission. I will be out of town part of this week but will discuss with partners  so can be done possibly Tuesday or Wednesday I have discussed the cath findings and proposed treatment with the patient and his wife.   Delight Ovens MD  Beeper [phone] Office [phone] 04/25/2012 7:00 PM

## 2012-04-25 NOTE — Brief Op Note (Signed)
04/25/2012  1:58 PM  PATIENT:  Jonathan Escobar  66 y.o. male swith h/o BMS to RCA in 1999 - then Brachytherapy for ISR & re-PCI in 2005 with DES.  New to the area, brought in via EMS who check ECG post onset of severe angina -- ~80mm STE in InfLeads (II,II, avF). Referered for emergent cath with emergency consent.  PRE-OPERATIVE DIAGNOSIS:  Inferioor stemi  POST-OPERATIVE DIAGNOSIS:    Culprit lesions: 100% post-stent stenosis with 95% mid stent ISR (proximal to occlusion) followed by ~70% RPL2 stenosis  S/p Successful Aspiration thrombectomy of proximal RCA & PTCA of both RCA lesions.  PTCA throughout stented segment  ~20% ostial LM  LAD - tapers to D1 that is large & angiographically normal; just after D1 - ~80% irregular lesion with short segment of post stenostic ectatic dilation then another~70% focal lesion with minimal irregularities downstream.  LCx - ~50% tubular lesion in mid vessel before bifurcating into OM1 and AVGroove vessel that terminates as 2 small LPL branches.  EF ~50-55% with mild mid Inferior & anterolateral hypokinesis  PROCEDURE:  Procedure(s) (LRB): LEFT AND RIGHT HEART CATHETERIZATION WITH CORONARY ANGIOGRAM (N/A) - RFA after R Radial spasm led to inability to place sheath. Aspiration Thrombectomy of proximal RCA Balloon Angioplasty of both lesion sites in RCA  SURGEON:  Surgeon(s) and Role:    * Marykay Lex, MD - Primary  ANESTHESIA:   local and IV sedation (4mg  Versed; 75 mcg Fentanuyl)  EBL:   < 10 ml DRAINS: 6Fr RFA sheat - JL4 - LCA; JR 4 Guide - RCA, angled pigtail post PCI   PCI: Wire- BMS; Integrelin & Angiomax; Pronto V4 Thrombectomy; multiple balloons (see final report)  LOCAL MEDICATIONS USED:  LIDOCAINE   DICTATION: .Note written in EPIC  PLAN OF CARE: Admit to inpatient ; CVTS consulted. Continue ASA, Integrelin & restart Heparin in 8 hr post sheath removal.3 BB (if BP & HR tolerate) Statin.  PATIENT DISPOSITION:  ICU -  extubated and stable. Angian Free.   Delay start of Pharmacological VTE agent (>24hrs) due to surgical blood loss or risk of bleeding: not applicable

## 2012-04-26 ENCOUNTER — Inpatient Hospital Stay (HOSPITAL_COMMUNITY): Payer: PRIVATE HEALTH INSURANCE

## 2012-04-26 DIAGNOSIS — Z01810 Encounter for preprocedural cardiovascular examination: Secondary | ICD-10-CM

## 2012-04-26 LAB — BASIC METABOLIC PANEL
Calcium: 9.1 mg/dL (ref 8.4–10.5)
GFR calc non Af Amer: >90 mL/min (ref >90)
Glucose, Bld: 103 mg/dL — ABNORMAL HIGH (ref 70–99)
Sodium: 138 mEq/L (ref 135–145)

## 2012-04-26 LAB — CBC
Hemoglobin: 13.2 g/dL (ref 13.0–17.0)
MCH: 30.8 pg (ref 26.0–34.0)
MCHC: 34.7 g/dL (ref 30.0–36.0)
Platelets: 222 10*3/uL (ref 150–400)

## 2012-04-26 LAB — POCT ACTIVATED CLOTTING TIME: Activated Clotting Time: 359 seconds

## 2012-04-26 LAB — TSH: TSH: 0.925 u[IU]/mL (ref 0.350–4.500)

## 2012-04-26 LAB — POCT I-STAT, CHEM 8
BUN: 11 mg/dL (ref 6–23)
Calcium, Ion: 1.15 mmol/L (ref 1.13–1.30)
Chloride: 102 mEq/L (ref 96–112)

## 2012-04-26 LAB — CARDIAC PANEL(CRET KIN+CKTOT+MB+TROPI)
Relative Index: 8.2 — ABNORMAL HIGH (ref 0.0–2.5)
Total CK: 363 U/L — ABNORMAL HIGH (ref 7–232)
Total CK: 337 U/L — ABNORMAL HIGH (ref 7–232)

## 2012-04-26 LAB — PULMONARY FUNCTION TEST

## 2012-04-26 LAB — HEPARIN LEVEL (UNFRACTIONATED)
Heparin Unfractionated: 0.11 IU/mL — ABNORMAL LOW (ref 0.30–0.70)
Heparin Unfractionated: 0.27 IU/mL — ABNORMAL LOW (ref 0.30–0.70)

## 2012-04-26 MED ORDER — HEPARIN (PORCINE) IN NACL 100-0.45 UNIT/ML-% IJ SOLN
1800.0000 [IU]/h | INTRAMUSCULAR | Status: DC
  Administered 2012-04-26 – 2012-04-28 (×3): 1800 [IU]/h via INTRAVENOUS
  Filled 2012-04-26 (×8): qty 250

## 2012-04-26 MED ORDER — METOPROLOL TARTRATE 12.5 MG HALF TABLET
12.5000 mg | ORAL_TABLET | Freq: Two times a day (BID) | ORAL | Status: DC
  Administered 2012-04-26 – 2012-04-27 (×4): 12.5 mg via ORAL
  Filled 2012-04-26 (×6): qty 1

## 2012-04-26 MED ORDER — HEPARIN (PORCINE) IN NACL 100-0.45 UNIT/ML-% IJ SOLN
1650.0000 [IU]/h | INTRAMUSCULAR | Status: DC
  Administered 2012-04-26: 1650 [IU]/h via INTRAVENOUS
  Filled 2012-04-26 (×2): qty 250

## 2012-04-26 MED FILL — Dextrose Inj 5%: INTRAVENOUS | Qty: 50 | Status: AC

## 2012-04-26 NOTE — Progress Notes (Signed)
ANTICOAGULATION CONSULT NOTE - Follow Up Consult  Pharmacy Consult for Heparin, Integrillin Indication: STEMI  Allergies  Allergen Reactions  . Amoxicillin Rash    Flu like symptoms    Patient Measurements: Height: 5\' 10"  (177.8 cm) Weight: 216 lb 0.8 oz (98 kg) IBW/kg (Calculated) : 73  Heparin Dosing Weight: 93Kg  Vital Signs: Temp: 98.6 F (37 C) (08/26 0700) Temp src: Oral (08/26 0400) BP: 128/80 mmHg (08/26 1328) Pulse Rate: 61  (08/26 1139)  Labs:  Basename 04/26/12 1312 04/26/12 0612 04/25/12 2306 04/25/12 1515 04/25/12 1249  HGB -- 13.2 -- 13.2 --  HCT -- 38.0* -- 37.4* 38.0*  PLT -- 222 -- 214 --  APTT -- -- -- -- --  LABPROT -- -- -- -- --  INR -- -- -- -- --  HEPARINUNFRC 0.11* <0.10* -- -- --  CREATININE -- 0.80 -- 0.65 0.80  CKTOTAL -- 337* 363* 229 --  CKMB -- 26.0* 29.7* 14.8* --  TROPONINI -- 5.20* 6.96* 3.54* --    Estimated Creatinine Clearance: 108.1 ml/min (by C-G formula based on Cr of 0.8).   Assessment: Mr. Franzoni has known CAD-DES stent to RCA, admitted for STEMI. He is s/p femoral cath, with thrombectomy of RCA & PTCA (100% post-stent stenosis and mid-stent in-stent restenosis). Noted CVTS consult for CABG. Patient with order for IV heparin and Integrilin until CABG. Heparin level (0.11) is below-goal on 1400 units/hr.   Goal of Therapy:  Heparin level 0.3-0.5 units/ml Monitor platelets by anticoagulation protocol: Yes   Plan:  1. Continue IV Integrillin at 17mcg/kg/min 2. Increase IV heparin to 1650 units/hr.  3. Heparin level in 6 hours.   Harland German, Pharm D 04/26/2012 2:24 PM

## 2012-04-26 NOTE — Progress Notes (Addendum)
VASCULAR LAB PRELIMINARY  PRELIMINARY  PRELIMINARY  PRELIMINARY  Pre-op Cardiac Surgery  Carotid Findings:  No evidence of significant ICA stenosis and vertebral artery flow is antegrade bilaterally.  Upper Extremity Right Left  Brachial Pressures 130T 129T  Radial Waveforms T T  Ulnar Waveforms T T  Palmar Arch (Allen's Test) WNL WNL   Findings:  Doppler waveforms remain normal with ulnar and radial compressions bilaterally.    Lower  Extremity Right Left  Dorsalis Pedis    Anterior Tibial    Posterior Tibial    Ankle/Brachial Indices      Findings:  Palpable pedal pulses x 4.   Jonathan Escobar, 04/26/2012, 9:47 AM

## 2012-04-26 NOTE — Progress Notes (Signed)
PFT completed at bedside. Unconfirmed copy of results placed in Shadow chart.

## 2012-04-26 NOTE — Progress Notes (Signed)
ANTICOAGULATION CONSULT NOTE - Follow Up Consult  Pharmacy Consult for Heparin, Integrillin Indication: STEMI  Allergies  Allergen Reactions  . Amoxicillin Rash    Flu like symptoms    Patient Measurements: Height: 5\' 10"  (177.8 cm) Weight: 216 lb 0.8 oz (98 kg) IBW/kg (Calculated) : 73  Heparin Dosing Weight: 93Kg  Vital Signs: Temp: 98.1 F (36.7 C) (08/26 2000) Temp src: Oral (08/26 2000) BP: 126/80 mmHg (08/26 2000) Pulse Rate: 62  (08/26 1700)  Labs:  Basename 04/26/12 2041 04/26/12 1312 04/26/12 0612 04/25/12 2306 04/25/12 1515 04/25/12 1249  HGB -- -- 13.2 -- 13.2 --  HCT -- -- 38.0* -- 37.4* 38.0*  PLT -- -- 222 -- 214 --  APTT -- -- -- -- -- --  LABPROT -- -- -- -- -- --  INR -- -- -- -- -- --  HEPARINUNFRC 0.27* 0.11* <0.10* -- -- --  CREATININE -- -- 0.80 -- 0.65 0.80  CKTOTAL -- -- 337* 363* 229 --  CKMB -- -- 26.0* 29.7* 14.8* --  TROPONINI -- -- 5.20* 6.96* 3.54* --    Estimated Creatinine Clearance: 108.1 ml/min (by C-G formula based on Cr of 0.8).   Assessment: Mr. Hessling has known CAD-DES stent to RCA, admitted for STEMI. He is s/p femoral cath, with thrombectomy of RCA & PTCA (100% post-stent stenosis and mid-stent in-stent restenosis). Noted CVTS consult for CABG. Patient with order for IV heparin and Integrilin until CABG. Heparin level almost at goal. Will adjust rate.    Goal of Therapy:  Heparin level 0.3-0.5 units/ml Monitor platelets by anticoagulation protocol: Yes   Plan:  1. Increase heparin to 1800 units/hr 2. F/u with AM level

## 2012-04-26 NOTE — Progress Notes (Signed)
Please advise on ambulation pre-CABG. Thx. Ethelda Chick CES, ACSM

## 2012-04-26 NOTE — Progress Notes (Signed)
[facility] DAILY PROGRESS NOTE  NAME:  Jonathan Escobar   MRN: [id number] DOB:  26-Mar-1946   ADMIT DATE: 04/25/2012   Patient Description   66 y.o. male with PMH below: presented with Inferior STEMI - 100% RCA occlusion at distal stent edge with 95% mid-stent ISR.      Past Medical History  Diagnosis Date  . CAD (coronary artery disease), history of RCA stent 1999    BMS to prox RCA 1999; Brachytherapy ~2003, followed by DES  PCI ([facility]) for ISR.  Marland Kitchen Hypertension   . Viral meningitis      after GI bleed  . GI bleed     several years ago   . Hyperlipidemia LDL goal < 70     on 10mg  Lipitor - unable to tolerate higher doses.    Clinical Course: Emergent cath with PTCA of RCA - plan for Staged CABG early this week. Length of Stay:  LOS: 1 day    Subjective:   Today Jonathan Escobar is feeling well.  No further CP & no arrythmias.  Objective:  Temp:  [97.6 F (36.4 C)-98.6 F (37 C)] 98.6 F (37 C) (08/26 0700) Pulse Rate:  [46-74] 73  (08/26 0700) Resp:  [9-26] 18  (08/26 0700) BP: (101-129)/(60-90) 116/85 mmHg (08/26 0921) SpO2:  [93 %-99 %] 95 % (08/26 0921) Weight:  [98 kg (216 lb 0.8 oz)] 98 kg (216 lb 0.8 oz) (08/25 1415) Weight change:  Physical Exam: General appearance: alert, cooperative, appears stated age, no distress and normal mood & affect Neck: no adenopathy, no carotid bruit, no JVD, supple, symmetrical, trachea midline and thyroid not enlarged, symmetric, no tenderness/mass/nodules Lungs: clear to auscultation bilaterally, normal percussion bilaterally and non-labored Heart: regular rate and rhythm, S1, S2 normal, no S3 or S4 and no R/M Abdomen: soft, non-tender; bowel sounds normal; no masses,  no organomegaly Extremities: extremities normal, atraumatic, no cyanosis or edema Neurologic: Grossly normal  Intake/Output from previous day: 08/25 0701 - 08/26 0700 In: 1961.3 [P.O.:540; I.V.:1421.3] Out: 2700 [Urine:2700]  Intake/Output  Summary (Last 24 hours) at 04/26/12 1006 Last data filed at 04/26/12 0700  Gross per 24 hour  Intake 1961.27 ml  Output   2700 ml  Net -738.73 ml    Results for orders placed during the hospital encounter of 04/25/12 (from the past 24 hour(s))  CARDIAC PANEL(CRET KIN+CKTOT+MB+TROPI)     Status: Abnormal   Collection Time   04/25/12 11:06 PM      Component Value Range   Total CK 363 (*) 7 - 232 U/L   CK, MB 29.7 (*) 0.3 - 4.0 ng/mL   Troponin I 6.96 (*) <0.30 ng/mL   Relative Index 8.2 (*) 0.0 - 2.5  CBC     Status: Abnormal   Collection Time   04/26/12  6:12 AM      Component Value Range   WBC 6.5  4.0 - 10.5 K/uL   RBC 4.29  4.22 - 5.81 MIL/uL   Hemoglobin 13.2  13.0 - 17.0 g/dL   HCT 08.6 (*) 57.8 - 46.9 %   MCV 88.6  78.0 - 100.0 fL   MCH 30.8  26.0 - 34.0 pg   MCHC 34.7  30.0 - 36.0 g/dL   RDW 62.9  52.8 - 41.3 %   Platelets 222  150 - 400 K/uL  BASIC METABOLIC PANEL     Status: Abnormal   Collection Time   04/26/12  6:12 AM  Component Value Range   Sodium 138  135 - 145 mEq/L   Potassium 4.0  3.5 - 5.1 mEq/L   Chloride 106  96 - 112 mEq/L   CO2 24  19 - 32 mEq/L   Glucose, Bld 103 (*) 70 - 99 mg/dL   BUN 8  6 - 23 mg/dL   Creatinine, Ser 2.13  0.50 - 1.35 mg/dL   Calcium 9.1  8.4 - 08.6 mg/dL   GFR calc non Af Amer >90  >90 mL/min   GFR calc Af Amer >90  >90 mL/min  HEPARIN LEVEL (UNFRACTIONATED)     Status: Abnormal   Collection Time   04/26/12  6:12 AM      Component Value Range   Heparin Unfractionated <0.10 (*) 0.30 - 0.70 IU/mL  CARDIAC PANEL(CRET KIN+CKTOT+MB+TROPI)     Status: Abnormal   Collection Time   04/26/12  6:12 AM      Component Value Range   Total CK 337 (*) 7 - 232 U/L   CK, MB 26.0 (*) 0.3 - 4.0 ng/mL   Troponin I 5.20 (*) <0.30 ng/mL   Relative Index 7.7 (*) 0.0 - 2.5    Imaging:  CXR (reviewed, unremarkable)  See cath report (summaried in Problem List)  MAR Reviewed  Assessment/Plan:  Principal Problem:  *ST elevation  myocardial infarction (STEMI) of inferior wall, initial episode of care - s/p PTCA of 95% ISR to 70% and 100% distal stent occlusion to ~40% (8/25) Active Problems:  CAD (coronary artery disease), native coronary artery -- early Mid LAD tandem 70% lesions with intervening ectatsia; extensive RCA disease with recurrent ISR/thrombosis - not PCI amenable. Plan CABG  CAD (coronary artery disease), history of RCA stent  Hyperlipidemia LDL goal < 70 - on Lipitor 10mg  @ home   Looks good.  Hemodynamically stable.  ECG normal with LAD.  No STE or Qs.  Troponin trending down.  Appreciate Dr. Dennie Maizes consultation -- will look for CABG Tues vs Wed (RCA & LAD).    Continue Heparin & Integrilin until stopped pre-op as PTCA only to diffusely diseased RCA (can stop for a few min to allow for shower if monitored by RN).  On ASA (no oral antiplatelet), & statin - have added low dose BB this AM as BP & HR have normalized.  Mostly bed rest, but ok to move about the room as long as no further CP  Time Spent Directly with Patient:  15 minutes   Guila Owensby W, M.D., M.S. [facility] [address] [place], [place]  [address]  [phone]  04/26/2012 10:06 AM

## 2012-04-26 NOTE — Care Management Note (Unsigned)
    Page 1 of 1   04/29/2012     4:07:35 PM   CARE MANAGEMENT NOTE 04/29/2012  Patient:  Jonathan Escobar,Jonathan Escobar   Account Number:  [id number]  Date Initiated:  04/26/2012  Documentation initiated by:  Junius Creamer  Subjective/Objective Assessment:   adm w mi, cabg on tues or wed     Action/Plan:   lives w wife   Anticipated DC Date:  05/03/2012   Anticipated DC Plan:  HOME W HOME HEALTH SERVICES      DC Planning Services  CM consult      Choice offered to / List presented to:             Status of service:  In process, will continue to follow Medicare Important Message given?   (If response is "NO", the following Medicare IM given date fields will be blank) Date Medicare IM given:   Date Additional Medicare IM given:    Discharge Disposition:    Per UR Regulation:  Reviewed for med. necessity/level of care/duration of stay  If discussed at Long Length of Stay Meetings, dates discussed:    Comments:  04/29/12 Tristan Proto,RN,BSN [phone]   [phone] PT S/P CABG X 4 ON 04/28/12.  PTA, PT INDEPENDENT, LIVES WITH SPOUSE. WIFE TO PROVIDE 24HR CARE AT DISCHARGE.  WILL FOLLOW FOR HOME NEEDS AS PT PROGRESSES.  8/26 12n debbie dowell rn,bsn [phone] spoke w financial co since no ins listed. they are verifying ins. has medicare a but verifying.

## 2012-04-26 NOTE — Progress Notes (Signed)
1 Day Post-Op Procedure(s) (LRB): LEFT AND RIGHT HEART CATHETERIZATION WITH CORONARY ANGIOGRAM (N/A) Subjective: No chest pain or SOB  Objective: Vital signs in last 24 hours: Temp:  [97.7 F (36.5 C)-98.6 F (37 C)] 98.3 F (36.8 C) (08/26 1600) Pulse Rate:  [54-73] 62  (08/26 1700) Cardiac Rhythm:  [-] Sinus bradycardia;Normal sinus rhythm (08/26 0700) Resp:  [16-21] 18  (08/26 0700) BP: (101-150)/(60-90) 150/82 mmHg (08/26 1900) SpO2:  [93 %-98 %] 98 % (08/26 1700)  Hemodynamic parameters for last 24 hours:    Intake/Output from previous day: 08/25 0701 - 08/26 0700 In: 1961.3 [P.O.:540; I.V.:1421.3] Out: 2700 [Urine:2700] Intake/Output this shift:      Lab Results:  Basename 04/26/12 0612 04/25/12 1515  WBC 6.5 9.7  HGB 13.2 13.2  HCT 38.0* 37.4*  PLT 222 214   BMET:  Basename 04/26/12 0612 04/25/12 1515  NA 138 136  K 4.0 3.6  CL 106 90*  CO2 24 22  GLUCOSE 103* 137*  BUN 8 10  CREATININE 0.80 0.65  CALCIUM 9.1 8.8    PT/INR: No results found for this basename: LABPROT,INR in the last 72 hours ABG    Component Value Date/Time   TCO2 19 04/25/2012 1249   CBG (last 3)  No results found for this basename: GLUCAP:3 in the last 72 hours  Assessment/Plan: S/P Procedure(s) (LRB): LEFT AND RIGHT HEART CATHETERIZATION WITH CORONARY ANGIOGRAM (N/A) Cath films, chart reviewed. I agree with need for CABG. Will plan to do Wednesday morning. I discussed the operative procedure with the patient including alternatives, benefits and risks; including but not limited to bleeding, blood transfusion, infection, stroke, myocardial infarction, graft failure, heart block requiring a permanent pacemaker, organ dysfunction, and death.  Jonathan Escobar understands and agrees to proceed.    LOS: 1 day    BARTLE,BRYAN K 04/26/2012

## 2012-04-26 NOTE — Progress Notes (Signed)
ANTICOAGULATION CONSULT NOTE - Follow Up Consult  Pharmacy Consult for Heparin, Integrillin Indication: STEMI  Allergies  Allergen Reactions  . Amoxicillin Rash    Flu like symptoms    Patient Measurements: Height: 5\' 10"  (177.8 cm) Weight: 216 lb 0.8 oz (98 kg) IBW/kg (Calculated) : 73  Heparin Dosing Weight: 93Kg  Vital Signs: Temp: 98.2 F (36.8 C) (08/26 0400) Temp src: Oral (08/26 0400) BP: 120/75 mmHg (08/26 0700) Pulse Rate: 73  (08/26 0700)  Labs:  Basename 04/26/12 0612 04/25/12 2306 04/25/12 1515  HGB 13.2 -- 13.2  HCT 38.0* -- 37.4*  PLT 222 -- 214  APTT -- -- --  LABPROT -- -- --  INR -- -- --  HEPARINUNFRC <0.10* -- --  CREATININE 0.80 -- 0.65  CKTOTAL 337* 363* 229  CKMB 26.0* 29.7* 14.8*  TROPONINI 5.20* 6.96* 3.54*    Estimated Creatinine Clearance: 108.1 ml/min (by C-G formula based on Cr of 0.8).   Medical History: Past Medical History  Diagnosis Date  . CAD (coronary artery disease), history of RCA stent 1999    BMS to prox RCA 1999; Brachytherapy ~2003, followed by DES  PCI ([facility]) for ISR.  Marland Kitchen Hypertension   . Viral meningitis      after GI bleed  . GI bleed     several years ago   . Hyperlipidemia LDL goal < 70     on 10mg  Lipitor - unable to tolerate higher doses.    Medications:  - med rec pending  Assessment: Mr. Myler has known CAD-DES stent to RCA, admitted today for STEMI. He is s/p femoral cath, with thrombectomy of RCA & PTCA (100% post-stent stenosis and mid-stent in-stent restenosis). Noted CVTS consult for CABG. Patient with order for IV heparin and Integrilin until CABG. Heparin level (<0.1) is below-goal on 1100 units/hr. No problem with line/infusion per RN. Noted platelets of 222.   Goal of Therapy:  Heparin level 0.3-0.5 units/ml Monitor platelets by anticoagulation protocol: Yes   Plan:  1. Continue IV Integrillin at 62mcg/kg/min 2. Increase IV heparin to 1400 units/hr.  3. Heparin level in 6 hours.   Lorre Munroe, PharmD 04/26/2012 7:27 AM

## 2012-04-27 DIAGNOSIS — Z01810 Encounter for preprocedural cardiovascular examination: Secondary | ICD-10-CM

## 2012-04-27 LAB — CBC
HCT: 36.9 % — ABNORMAL LOW (ref 39.0–52.0)
Hemoglobin: 13 g/dL (ref 13.0–17.0)
RBC: 4.14 MIL/uL — ABNORMAL LOW (ref 4.22–5.81)
WBC: 6.3 10*3/uL (ref 4.0–10.5)

## 2012-04-27 LAB — TYPE AND SCREEN: ABO/RH(D): A POS

## 2012-04-27 LAB — HEPARIN LEVEL (UNFRACTIONATED): Heparin Unfractionated: 0.32 IU/mL (ref 0.30–0.70)

## 2012-04-27 MED ORDER — EPINEPHRINE HCL 1 MG/ML IJ SOLN
0.5000 ug/min | INTRAVENOUS | Status: DC
  Filled 2012-04-27: qty 4

## 2012-04-27 MED ORDER — LOPERAMIDE HCL 2 MG PO CAPS
2.0000 mg | ORAL_CAPSULE | ORAL | Status: DC | PRN
  Filled 2012-04-27 (×2): qty 1

## 2012-04-27 MED ORDER — POTASSIUM CHLORIDE 2 MEQ/ML IV SOLN
80.0000 meq | INTRAVENOUS | Status: DC
  Filled 2012-04-27: qty 40

## 2012-04-27 MED ORDER — VANCOMYCIN HCL 1000 MG IV SOLR
1250.0000 mg | INTRAVENOUS | Status: AC
  Administered 2012-04-28: 1250 mg via INTRAVENOUS
  Filled 2012-04-27: qty 1250

## 2012-04-27 MED ORDER — BISACODYL 5 MG PO TBEC
5.0000 mg | DELAYED_RELEASE_TABLET | Freq: Once | ORAL | Status: AC
  Administered 2012-04-27: 5 mg via ORAL
  Filled 2012-04-27: qty 1

## 2012-04-27 MED ORDER — NITROGLYCERIN IN D5W 200-5 MCG/ML-% IV SOLN
2.0000 ug/min | INTRAVENOUS | Status: AC
  Administered 2012-04-28: 5 ug/min via INTRAVENOUS
  Filled 2012-04-27: qty 250

## 2012-04-27 MED ORDER — PHENYLEPHRINE HCL 10 MG/ML IJ SOLN
30.0000 ug/min | INTRAVENOUS | Status: AC
  Administered 2012-04-28: 10 ug/min via INTRAVENOUS
  Filled 2012-04-27: qty 2

## 2012-04-27 MED ORDER — MAGNESIUM SULFATE 50 % IJ SOLN
40.0000 meq | INTRAMUSCULAR | Status: DC
  Filled 2012-04-27: qty 10

## 2012-04-27 MED ORDER — SODIUM BICARBONATE 8.4 % IV SOLN
INTRAVENOUS | Status: AC
  Administered 2012-04-28: 11:00:00
  Filled 2012-04-27: qty 2.5

## 2012-04-27 MED ORDER — CHLORHEXIDINE GLUCONATE 4 % EX LIQD
60.0000 mL | Freq: Once | CUTANEOUS | Status: AC
  Administered 2012-04-28: 4 via TOPICAL
  Filled 2012-04-27: qty 60

## 2012-04-27 MED ORDER — PANTOPRAZOLE SODIUM 40 MG PO TBEC
40.0000 mg | DELAYED_RELEASE_TABLET | Freq: Every day | ORAL | Status: DC
  Administered 2012-04-27: 40 mg via ORAL
  Filled 2012-04-27: qty 1

## 2012-04-27 MED ORDER — TEMAZEPAM 15 MG PO CAPS: 15.0000 mg | ORAL_CAPSULE | Freq: Once | ORAL | Status: AC | PRN

## 2012-04-27 MED ORDER — SODIUM CHLORIDE 0.9 % IV SOLN
INTRAVENOUS | Status: DC
  Administered 2012-04-27: 01:00:00 via INTRAVENOUS

## 2012-04-27 MED ORDER — TRANEXAMIC ACID (OHS) PUMP PRIME SOLUTION
2.0000 mg/kg | INTRAVENOUS | Status: DC
  Filled 2012-04-27 (×2): qty 1.96

## 2012-04-27 MED ORDER — DEXMEDETOMIDINE HCL IN NACL 400 MCG/100ML IV SOLN
0.1000 ug/kg/h | INTRAVENOUS | Status: AC
  Administered 2012-04-28: 0.2 ug/kg/h via INTRAVENOUS
  Filled 2012-04-27 (×2): qty 100

## 2012-04-27 MED ORDER — METOPROLOL TARTRATE 12.5 MG HALF TABLET
12.5000 mg | ORAL_TABLET | Freq: Once | ORAL | Status: AC
  Administered 2012-04-28: 12.5 mg via ORAL
  Filled 2012-04-27: qty 1

## 2012-04-27 MED ORDER — TRANEXAMIC ACID 100 MG/ML IV SOLN
1.5000 mg/kg/h | INTRAVENOUS | Status: AC
  Administered 2012-04-28: 1.5 mg/kg/h via INTRAVENOUS
  Filled 2012-04-27 (×2): qty 25

## 2012-04-27 MED ORDER — MOXIFLOXACIN HCL IN NACL 400 MG/250ML IV SOLN
400.0000 mg | INTRAVENOUS | Status: AC
  Administered 2012-04-28: 400 mg via INTRAVENOUS
  Filled 2012-04-27: qty 250

## 2012-04-27 MED ORDER — ALUM & MAG HYDROXIDE-SIMETH 200-200-20 MG/5ML PO SUSP
15.0000 mL | ORAL | Status: DC | PRN
  Administered 2012-04-27 (×2): 15 mL via ORAL
  Filled 2012-04-27 (×2): qty 30

## 2012-04-27 MED ORDER — DOPAMINE-DEXTROSE 3.2-5 MG/ML-% IV SOLN
2.0000 ug/kg/min | INTRAVENOUS | Status: DC
  Filled 2012-04-27: qty 250

## 2012-04-27 MED ORDER — DIAZEPAM 5 MG PO TABS
10.0000 mg | ORAL_TABLET | Freq: Once | ORAL | Status: AC
  Administered 2012-04-28: 10 mg via ORAL
  Filled 2012-04-27: qty 2

## 2012-04-27 MED ORDER — SODIUM CHLORIDE 0.9 % IV SOLN
INTRAVENOUS | Status: AC
  Administered 2012-04-28: 1.2 [IU]/h via INTRAVENOUS
  Filled 2012-04-27: qty 1

## 2012-04-27 MED ORDER — TRANEXAMIC ACID (OHS) BOLUS VIA INFUSION
15.0000 mg/kg | INTRAVENOUS | Status: AC
  Administered 2012-04-28: 1470 mg via INTRAVENOUS
  Filled 2012-04-27 (×2): qty 1470

## 2012-04-27 MED ORDER — ALPRAZOLAM 0.25 MG PO TABS
0.2500 mg | ORAL_TABLET | ORAL | Status: DC | PRN
  Administered 2012-04-27: 0.25 mg via ORAL
  Filled 2012-04-27: qty 1

## 2012-04-27 NOTE — Progress Notes (Signed)
[facility]  Subjective: Complaining of diarrhea.  No SOB or CP.  Objective: Vital signs in last 24 hours: Temp:  [98 F (36.7 C)-98.3 F (36.8 C)] 98 F (36.7 C) (08/26 2300) Pulse Rate:  [61-66] 62  (08/26 1700) BP: (105-150)/(70-86) 115/70 mmHg (08/27 0637) SpO2:  [95 %-98 %] 98 % (08/26 1700) Last BM Date: 04/25/12  Intake/Output from previous day: 08/26 0701 - 08/27 0700 In: 1437.8 [P.O.:600; I.V.:837.8] Out: -  Intake/Output this shift:    Medications Current Facility-Administered Medications  Medication Dose Route Frequency Provider Last Rate Last Dose  . 0.9 %  sodium chloride infusion  250 mL Intravenous PRN Marykay Lex, MD 10 mL/hr at 04/25/12 2249    . 0.9 %  sodium chloride infusion   Intravenous Continuous Marykay Lex, MD 10 mL/hr at 04/27/12 0106    . acetaminophen (TYLENOL) tablet 650 mg  650 mg Oral Q4H PRN Marykay Lex, MD      . ALPRAZolam Prudy Feeler) tablet 0.25 mg  0.25 mg Oral TID PRN Marykay Lex, MD   0.25 mg at 04/26/12 2304  . aspirin EC tablet 325 mg  325 mg Oral Daily Marykay Lex, MD   325 mg at 04/26/12 1007  . atorvastatin (LIPITOR) tablet 80 mg  80 mg Oral q1800 Marykay Lex, MD   80 mg at 04/26/12 1718  . eptifibatide (INTEGRILIN) 75 mg / 100 mL infusion  2 mcg/kg/min Intravenous Continuous Marykay Lex, MD 15.7 mL/hr at 04/27/12 0547 2 mcg/kg/min at 04/27/12 0547  . heparin ADULT infusion 100 units/mL (25000 units/250 mL)  1,800 Units/hr Intravenous Continuous Marykay Lex, MD 18 mL/hr at 04/26/12 2300 1,800 Units/hr at 04/26/12 2300  . metoprolol tartrate (LOPRESSOR) tablet 12.5 mg  12.5 mg Oral BID Marykay Lex, MD   12.5 mg at 04/26/12 2307  . morphine 2 MG/ML injection 2 mg  2 mg Intravenous Q1H PRN Marykay Lex, MD      . ondansetron [facility]) injection 4 mg  4 mg Intravenous Q6H PRN Marykay Lex, MD      . potassium chloride SA (K-DUR,KLOR-CON) CR tablet 40 mEq  40 mEq Oral BID  Marykay Lex, MD   40 mEq at 04/26/12 1007  . sodium chloride 0.9 % injection 3 mL  3 mL Intravenous Q12H Marykay Lex, MD      . sodium chloride 0.9 % injection 3 mL  3 mL Intravenous PRN Marykay Lex, MD      . traMADol Janean Sark) tablet 50 mg  50 mg Oral Q6H PRN Abelino Derrick, PA      . zolpidem [facility]) tablet 5 mg  5 mg Oral QHS PRN Abelino Derrick, [place]      . DISCONTD: heparin ADULT infusion 100 units/mL (25000 units/250 mL)  1,400 Units/hr Intravenous Continuous Marykay Lex, MD 14 mL/hr at 04/26/12 0754 1,400 Units/hr at 04/26/12 0754  . DISCONTD: heparin ADULT infusion 100 units/mL (25000 units/250 mL)  1,650 Units/hr Intravenous Continuous Benny Lennert, PHARMD 16.5 mL/hr at 04/26/12 1807 1,650 Units/hr at 04/26/12 1807    PE: General appearance: alert, cooperative and no distress Lungs: clear to auscultation bilaterally Heart: regular rate and rhythm, S1, S2 normal, no murmur, click, rub or gallop Extremities: No LEE Pulses: 2+ and symmetric Skin: Warm and Dry Neurologic: Grossly normal  Lab Results:   Basename 04/27/12 0550 04/26/12 0612 04/25/12 1515  WBC 6.3 6.5 9.7  HGB 13.0 13.2 13.2  HCT 36.9* 38.0* 37.4*  PLT 205 222 214   BMET  Basename 04/26/12 0612 04/25/12 1515 04/25/12 1249  NA 138 136 136  K 4.0 3.6 3.0*  CL 106 90* 102  CO2 24 22 --  GLUCOSE 103* 137* 129*  BUN 8 10 11   CREATININE 0.80 0.65 0.80  CALCIUM 9.1 8.8 --    Assessment/Plan  Principal Problem:  *ST elevation myocardial infarction (STEMI) of inferior wall, initial episode of care - s/p PTCA of 95% ISR to 70% and 100% distal stent occlusion to ~40% (8/25) Active Problems:  CAD (coronary artery disease), history of RCA stent  Hyperlipidemia LDL goal < 70 - on Lipitor 10mg  @ home  CAD (coronary artery disease), native coronary artery -- early Mid LAD tandem 70% lesions with intervening ectatsia; extensive RCA disease with recurrent ISR/thrombosis - not PCI amenable. Plan  CABG  Plan:  S/P ST elevation myocardial infarction (STEMI) of inferior wall, - s/p RCA PTCA of 95% ISR to 70% and 100% distal stent occlusion to ~40%  CABG planned for tomorrow morning.   Imodium for diarrhea  BP and HR stable.   LOS: 2 days    HAGER, BRYAN 04/27/2012 8:30 AM   Agree with note written by Jones Skene PAC  No CP. On integrillin. Exam benign. S/P STEMI/PCI. For CABG tomorrow.  Runell Gess 04/27/2012 9:13 AM

## 2012-04-27 NOTE — Progress Notes (Signed)
ANTICOAGULATION CONSULT NOTE - Follow Up Consult  Pharmacy Consult for Heparin, Integrillin Indication: STEMI  Allergies  Allergen Reactions  . Amoxicillin Rash    Flu like symptoms    Patient Measurements: Height: 5\' 10"  (177.8 cm) Weight: 216 lb 0.8 oz (98 kg) IBW/kg (Calculated) : 73  Heparin Dosing Weight: 93Kg  Vital Signs: Temp: 98.6 F (37 C) (08/27 1200) Temp src: Oral (08/27 1200) BP: 122/77 mmHg (08/27 1200)  Labs:  Basename 04/27/12 1136 04/27/12 0550 04/26/12 2041 04/26/12 0612 04/25/12 2306 04/25/12 1515 04/25/12 1249  HGB -- 13.0 -- 13.2 -- -- --  HCT -- 36.9* -- 38.0* -- 37.4* --  PLT -- 205 -- 222 -- 214 --  APTT -- -- -- -- -- -- --  LABPROT -- -- -- -- -- -- --  INR -- -- -- -- -- -- --  HEPARINUNFRC 0.32 0.42 0.27* -- -- -- --  CREATININE -- -- -- 0.80 -- 0.65 0.80  CKTOTAL -- -- -- 337* 363* 229 --  CKMB -- -- -- 26.0* 29.7* 14.8* --  TROPONINI -- -- -- 5.20* 6.96* 3.54* --    Estimated Creatinine Clearance: 108.1 ml/min (by C-G formula based on Cr of 0.8).   Assessment: Jonathan Escobar has known CAD-DES stent to RCA, admitted for STEMI. He is s/p femoral cath, with thrombectomy of RCA & PTCA (100% post-stent stenosis and mid-stent in-stent restenosis). Noted for CABG in am. Patient with order for IV heparin and Integrilin until CABG. Heparin level at goal this am and level confirmed at goal x2.     Goal of Therapy:  Heparin level 0.3-0.5 units/ml Monitor platelets by anticoagulation protocol: Yes   Plan:  1. Continue heparin at 1800 units/hr 2.  Will follow am labs  Harland German, [place] D 04/27/2012 1:38 PM

## 2012-04-27 NOTE — Progress Notes (Signed)
ANTICOAGULATION CONSULT NOTE - Follow Up Consult  Pharmacy Consult for Heparin, Integrillin Indication: STEMI  Allergies  Allergen Reactions  . Amoxicillin Rash    Flu like symptoms    Patient Measurements: Height: 5\' 10"  (177.8 cm) Weight: 216 lb 0.8 oz (98 kg) IBW/kg (Calculated) : 73  Heparin Dosing Weight: 93Kg  Vital Signs: Temp: 98 F (36.7 C) (08/26 2300) Temp src: Oral (08/26 2300) BP: 122/83 mmHg (08/26 2300)  Labs:  Basename 04/27/12 0550 04/26/12 2041 04/26/12 1312 04/26/12 0612 04/25/12 2306 04/25/12 1515 04/25/12 1249  HGB 13.0 -- -- 13.2 -- -- --  HCT 36.9* -- -- 38.0* -- 37.4* --  PLT 205 -- -- 222 -- 214 --  APTT -- -- -- -- -- -- --  LABPROT -- -- -- -- -- -- --  INR -- -- -- -- -- -- --  HEPARINUNFRC 0.42 0.27* 0.11* -- -- -- --  CREATININE -- -- -- 0.80 -- 0.65 0.80  CKTOTAL -- -- -- 337* 363* 229 --  CKMB -- -- -- 26.0* 29.7* 14.8* --  TROPONINI -- -- -- 5.20* 6.96* 3.54* --    Estimated Creatinine Clearance: 108.1 ml/min (by C-G formula based on Cr of 0.8).   Assessment: Jonathan Escobar has known CAD-DES stent to RCA, admitted for STEMI. He is s/p femoral cath, with thrombectomy of RCA & PTCA (100% post-stent stenosis and mid-stent in-stent restenosis). Noted CVTS consult for CABG. Patient with order for IV heparin and Integrilin until CABG. Heparin level at goal this am.     Goal of Therapy:  Heparin level 0.3-0.5 units/ml Monitor platelets by anticoagulation protocol: Yes   Plan:  1. Continue heparin at 1800 units/hr 2. F/u with level in 6 hours to confirm therapeutic.

## 2012-04-27 NOTE — Progress Notes (Signed)
2 Days Post-Op Procedure(s) (LRB): LEFT AND RIGHT HEART CATHETERIZATION WITH CORONARY ANGIOGRAM (N/A) Subjective:  No complaints of chest pain or SOB  Objective: Vital signs in last 24 hours: Temp:  [97.9 F (36.6 C)-98.6 F (37 C)] 97.9 F (36.6 C) (08/27 1600) Pulse Rate:  [70] 70  (08/27 1600) Cardiac Rhythm:  [-] Normal sinus rhythm (08/27 1600) Resp:  [16-20] 18  (08/27 1600) BP: (115-154)/(70-85) 127/85 mmHg (08/27 1600) SpO2:  [99 %-100 %] 100 % (08/27 1600)  Hemodynamic parameters for last 24 hours:    Intake/Output from previous day: 08/26 0701 - 08/27 0700 In: 1437.8 [P.O.:600; I.V.:837.8] Out: -  Intake/Output this shift: Total I/O In: 1397 [P.O.:960; I.V.:437] Out: -   Heart: regular rate and rhythm Lungs: clear Lab Results:  Basename 04/27/12 0550 04/26/12 0612  WBC 6.3 6.5  HGB 13.0 13.2  HCT 36.9* 38.0*  PLT 205 222   BMET:  Basename 04/26/12 0612 04/25/12 1515  NA 138 136  K 4.0 3.6  CL 106 90*  CO2 24 22  GLUCOSE 103* 137*  BUN 8 10  CREATININE 0.80 0.65  CALCIUM 9.1 8.8    PT/INR: No results found for this basename: LABPROT,INR in the last 72 hours ABG    Component Value Date/Time   TCO2 19 04/25/2012 1249   CBG (last 3)  No results found for this basename: GLUCAP:3 in the last 72 hours  Assessment/Plan: S/P Procedure(s) (LRB): LEFT AND RIGHT HEART CATHETERIZATION WITH CORONARY ANGIOGRAM (N/A) Stable for CABG in am.   LOS: 2 days    Curstin Schmale K 04/27/2012

## 2012-04-28 ENCOUNTER — Inpatient Hospital Stay (HOSPITAL_COMMUNITY): Payer: PRIVATE HEALTH INSURANCE | Admitting: Certified Registered"

## 2012-04-28 ENCOUNTER — Inpatient Hospital Stay (HOSPITAL_COMMUNITY): Payer: PRIVATE HEALTH INSURANCE

## 2012-04-28 ENCOUNTER — Encounter (HOSPITAL_COMMUNITY)
Admission: EM | Disposition: A | Discharge status: Home / Self Care | Payer: Self-pay | Source: Ambulatory Visit | Attending: Surgery

## 2012-04-28 ENCOUNTER — Encounter (HOSPITAL_COMMUNITY): Payer: Self-pay | Admitting: Certified Registered"

## 2012-04-28 DIAGNOSIS — Z951 Presence of aortocoronary bypass graft: Secondary | ICD-10-CM

## 2012-04-28 DIAGNOSIS — I2510 Atherosclerotic heart disease of native coronary artery without angina pectoris: Secondary | ICD-10-CM

## 2012-04-28 HISTORY — PX: CARDIAC CATHETERIZATION: SHX172

## 2012-04-28 HISTORY — PX: CORONARY ARTERY BYPASS GRAFT: SHX141

## 2012-04-28 HISTORY — DX: Presence of aortocoronary bypass graft: Z95.1

## 2012-04-28 LAB — POCT I-STAT 4, (NA,K, GLUC, HGB,HCT)
Glucose, Bld: 100 mg/dL — ABNORMAL HIGH (ref 70–99)
Glucose, Bld: 90 mg/dL (ref 70–99)
Glucose, Bld: 110 mg/dL — ABNORMAL HIGH (ref 70–99)
Glucose, Bld: 115 mg/dL — ABNORMAL HIGH (ref 70–99)
HCT: 37 % — ABNORMAL LOW (ref 39.0–52.0)
HCT: 29 % — ABNORMAL LOW (ref 39.0–52.0)
HCT: 29 % — ABNORMAL LOW (ref 39.0–52.0)
HCT: 39 % (ref 39.0–52.0)
HCT: 36 % — ABNORMAL LOW (ref 39.0–52.0)
Hemoglobin: 12.6 g/dL — ABNORMAL LOW (ref 13.0–17.0)
Hemoglobin: 9.9 g/dL — ABNORMAL LOW (ref 13.0–17.0)
Hemoglobin: 9.9 g/dL — ABNORMAL LOW (ref 13.0–17.0)
Hemoglobin: 12.2 g/dL — ABNORMAL LOW (ref 13.0–17.0)
Potassium: 3.9 meq/L (ref 3.5–5.1)
Potassium: 4.2 meq/L (ref 3.5–5.1)
Potassium: 4.7 meq/L (ref 3.5–5.1)
Sodium: 139 meq/L (ref 135–145)
Sodium: 138 mEq/L (ref 135–145)
Sodium: 136 meq/L (ref 135–145)
Sodium: 136 mEq/L (ref 135–145)
Sodium: 137 meq/L (ref 135–145)
Sodium: 139 mEq/L (ref 135–145)

## 2012-04-28 LAB — POCT I-STAT 3, ART BLOOD GAS (G3+)
Acid-base deficit: 2 mmol/L (ref 0.0–2.0)
Acid-base deficit: 3 mmol/L — ABNORMAL HIGH (ref 0.0–2.0)
Acid-base deficit: 4 mmol/L — ABNORMAL HIGH (ref 0.0–2.0)
Bicarbonate: 25 meq/L — ABNORMAL HIGH (ref 20.0–24.0)
Bicarbonate: 24 meq/L (ref 20.0–24.0)
Bicarbonate: 22.7 mEq/L (ref 20.0–24.0)
Bicarbonate: 24 mEq/L (ref 20.0–24.0)
O2 Saturation: 100 %
O2 Saturation: 100 %
O2 Saturation: 94 %
Patient temperature: 35.8
Patient temperature: 36.8
TCO2: 26 mmol/L (ref 0–100)
TCO2: 25 mmol/L (ref 0–100)
TCO2: 25 mmol/L (ref 0–100)
TCO2: 22 mmol/L (ref 0–100)
pCO2 arterial: 42.1 mmHg (ref 35.0–45.0)
pCO2 arterial: 45.1 mmHg — ABNORMAL HIGH (ref 35.0–45.0)
pCO2 arterial: 37.1 mmHg (ref 35.0–45.0)
pH, Arterial: 7.381 (ref 7.350–7.450)
pH, Arterial: 7.334 — ABNORMAL LOW (ref 7.350–7.450)
pH, Arterial: 7.363 (ref 7.350–7.450)
pH, Arterial: 7.369 (ref 7.350–7.450)
pH, Arterial: 7.347 — ABNORMAL LOW (ref 7.350–7.450)
pO2, Arterial: 292 mmHg — ABNORMAL HIGH (ref 80.0–100.0)
pO2, Arterial: 272 mmHg — ABNORMAL HIGH (ref 80.0–100.0)
pO2, Arterial: 62 mmHg — ABNORMAL LOW (ref 80.0–100.0)

## 2012-04-28 LAB — BASIC METABOLIC PANEL
BUN: 9 mg/dL (ref 6–23)
Calcium: 9.4 mg/dL (ref 8.4–10.5)
GFR calc non Af Amer: 89 mL/min — ABNORMAL LOW (ref >90)
Glucose, Bld: 99 mg/dL (ref 70–99)

## 2012-04-28 LAB — POCT I-STAT, CHEM 8
BUN: 7 mg/dL (ref 6–23)
Calcium, Ion: 1.2 mmol/L (ref 1.13–1.30)
Hemoglobin: 12.2 g/dL — ABNORMAL LOW (ref 13.0–17.0)
TCO2: 21 mmol/L (ref 0–100)

## 2012-04-28 LAB — HEPARIN LEVEL (UNFRACTIONATED): Heparin Unfractionated: 0.3 IU/mL (ref 0.30–0.70)

## 2012-04-28 LAB — POCT ACTIVATED CLOTTING TIME
Activated Clotting Time: 304 s
Activated Clotting Time: 374 s

## 2012-04-28 LAB — CBC
Hemoglobin: 13.7 g/dL (ref 13.0–17.0)
MCH: 31.1 pg (ref 26.0–34.0)
MCHC: 34 g/dL (ref 30.0–36.0)
MCV: 89.6 fL (ref 78.0–100.0)
Platelets: 169 10*3/uL (ref 150–400)
Platelets: 176 10*3/uL (ref 150–400)
RBC: 4.43 MIL/uL (ref 4.22–5.81)
RBC: 3.96 MIL/uL — ABNORMAL LOW (ref 4.22–5.81)
RDW: 13 % (ref 11.5–15.5)
RDW: 13.1 % (ref 11.5–15.5)
WBC: 6.1 10*3/uL (ref 4.0–10.5)
WBC: 10.8 10*3/uL — ABNORMAL HIGH (ref 4.0–10.5)

## 2012-04-28 LAB — CREATININE, SERUM
Creatinine, Ser: 0.73 mg/dL (ref 0.50–1.35)
GFR calc non Af Amer: >90 mL/min (ref >90)

## 2012-04-28 LAB — MAGNESIUM: Magnesium: 3 mg/dL — ABNORMAL HIGH (ref 1.5–2.5)

## 2012-04-28 LAB — GLUCOSE, CAPILLARY
Glucose-Capillary: 106 mg/dL — ABNORMAL HIGH (ref 70–99)
Glucose-Capillary: 107 mg/dL — ABNORMAL HIGH (ref 70–99)

## 2012-04-28 LAB — HEMOGLOBIN AND HEMATOCRIT, BLOOD: HCT: 30.6 % — ABNORMAL LOW (ref 39.0–52.0)

## 2012-04-28 SURGERY — CORONARY ARTERY BYPASS GRAFTING (CABG)
Anesthesia: General | Site: Chest | Wound class: Clean

## 2012-04-28 MED ORDER — THROMBIN 20000 UNITS EX SOLR
CUTANEOUS | Status: DC | PRN
  Administered 2012-04-28: 20000 [IU] via TOPICAL

## 2012-04-28 MED ORDER — HEPARIN SODIUM (PORCINE) 1000 UNIT/ML IJ SOLN
INTRAMUSCULAR | Status: DC | PRN
  Administered 2012-04-28: 36000 [IU] via INTRAVENOUS

## 2012-04-28 MED ORDER — MOXIFLOXACIN HCL IN NACL 400 MG/250ML IV SOLN
400.0000 mg | INTRAVENOUS | Status: AC
  Administered 2012-04-29: 400 mg via INTRAVENOUS
  Filled 2012-04-28: qty 250

## 2012-04-28 MED ORDER — ALBUMIN HUMAN 5 % IV SOLN
250.0000 mL | INTRAVENOUS | Status: AC | PRN
  Administered 2012-04-28 – 2012-04-29 (×4): 250 mL via INTRAVENOUS
  Filled 2012-04-28: qty 500

## 2012-04-28 MED ORDER — BISACODYL 10 MG RE SUPP: 10.0000 mg | Freq: Every day | RECTAL | Status: DC

## 2012-04-28 MED ORDER — ATORVASTATIN CALCIUM 80 MG PO TABS
80.0000 mg | ORAL_TABLET | Freq: Every day | ORAL | Status: DC
  Administered 2012-04-29: 80 mg via ORAL
  Filled 2012-04-28 (×2): qty 1

## 2012-04-28 MED ORDER — SODIUM CHLORIDE 0.9 % IJ SOLN: 3.0000 mL | INTRAMUSCULAR | Status: DC | PRN

## 2012-04-28 MED ORDER — FENTANYL CITRATE 0.05 MG/ML IJ SOLN
INTRAMUSCULAR | Status: DC | PRN
  Administered 2012-04-28: 50 ug via INTRAVENOUS
  Administered 2012-04-28: 100 ug via INTRAVENOUS
  Administered 2012-04-28: 900 ug via INTRAVENOUS
  Administered 2012-04-28: 100 ug via INTRAVENOUS
  Administered 2012-04-28 (×2): 50 ug via INTRAVENOUS
  Administered 2012-04-28 (×2): 250 ug via INTRAVENOUS

## 2012-04-28 MED ORDER — SODIUM CHLORIDE 0.45 % IV SOLN
INTRAVENOUS | Status: DC
  Administered 2012-04-28: 20 mL via INTRAVENOUS

## 2012-04-28 MED ORDER — THROMBIN 20000 UNITS EX SOLR
CUTANEOUS | Status: AC
  Filled 2012-04-28: qty 20000

## 2012-04-28 MED ORDER — PROTAMINE SULFATE 10 MG/ML IV SOLN
INTRAVENOUS | Status: DC | PRN
  Administered 2012-04-28: 280 mg via INTRAVENOUS

## 2012-04-28 MED ORDER — ACETAMINOPHEN 500 MG PO TABS
1000.0000 mg | ORAL_TABLET | Freq: Four times a day (QID) | ORAL | Status: DC
  Administered 2012-04-28 – 2012-04-30 (×6): 1000 mg via ORAL
  Filled 2012-04-28 (×10): qty 2

## 2012-04-28 MED ORDER — VECURONIUM BROMIDE 10 MG IV SOLR
INTRAVENOUS | Status: DC | PRN
  Administered 2012-04-28: 10 mg via INTRAVENOUS

## 2012-04-28 MED ORDER — BISACODYL 5 MG PO TBEC
10.0000 mg | DELAYED_RELEASE_TABLET | Freq: Every day | ORAL | Status: DC
  Administered 2012-04-29 – 2012-04-30 (×2): 10 mg via ORAL
  Filled 2012-04-28: qty 1
  Filled 2012-04-28: qty 2
  Filled 2012-04-28: qty 1

## 2012-04-28 MED ORDER — SODIUM CHLORIDE 0.9 % IV SOLN: 250.0000 mL | INTRAVENOUS | Status: DC

## 2012-04-28 MED ORDER — SODIUM CHLORIDE 0.9 % IV SOLN
INTRAVENOUS | Status: DC
  Administered 2012-04-28: 1.2 [IU]/h via INTRAVENOUS
  Administered 2012-04-28: 0.9 [IU]/h via INTRAVENOUS
  Filled 2012-04-28: qty 1

## 2012-04-28 MED ORDER — ACETAMINOPHEN 160 MG/5ML PO SOLN
650.0000 mg | ORAL | Status: AC
  Administered 2012-04-28: 650 mg
  Filled 2012-04-28: qty 20.3

## 2012-04-28 MED ORDER — POTASSIUM CHLORIDE 10 MEQ/50ML IV SOLN
10.0000 meq | INTRAVENOUS | Status: AC
  Administered 2012-04-28 (×3): 10 meq via INTRAVENOUS

## 2012-04-28 MED ORDER — SODIUM CHLORIDE 0.9 % IJ SOLN
3.0000 mL | Freq: Two times a day (BID) | INTRAMUSCULAR | Status: DC
  Administered 2012-04-29 (×2): 3 mL via INTRAVENOUS

## 2012-04-28 MED ORDER — NITROGLYCERIN IN D5W 200-5 MCG/ML-% IV SOLN: 0.0000 ug/min | INTRAVENOUS | Status: DC

## 2012-04-28 MED ORDER — MORPHINE SULFATE 2 MG/ML IJ SOLN
1.0000 mg | INTRAMUSCULAR | Status: AC | PRN
  Filled 2012-04-28: qty 1

## 2012-04-28 MED ORDER — KETOROLAC TROMETHAMINE 15 MG/ML IJ SOLN
15.0000 mg | Freq: Four times a day (QID) | INTRAMUSCULAR | Status: DC | PRN
  Administered 2012-04-28 – 2012-04-30 (×5): 15 mg via INTRAVENOUS
  Filled 2012-04-28: qty 1
  Filled 2012-04-28: qty 6
  Filled 2012-04-28 (×4): qty 1

## 2012-04-28 MED ORDER — THROMBIN 20000 UNITS EX SOLR
OROMUCOSAL | Status: DC | PRN
  Administered 2012-04-28: 11:00:00 via TOPICAL

## 2012-04-28 MED ORDER — SODIUM CHLORIDE 0.9 % IV SOLN
INTRAVENOUS | Status: DC | PRN
  Administered 2012-04-28: 14:00:00 via INTRAVENOUS

## 2012-04-28 MED ORDER — PANTOPRAZOLE SODIUM 40 MG PO TBEC: 40.0000 mg | DELAYED_RELEASE_TABLET | Freq: Every day | ORAL | Status: DC

## 2012-04-28 MED ORDER — MORPHINE SULFATE 2 MG/ML IJ SOLN
2.0000 mg | INTRAMUSCULAR | Status: DC | PRN
  Administered 2012-04-28: 4 mg via INTRAVENOUS
  Administered 2012-04-28 (×2): 2 mg via INTRAVENOUS
  Administered 2012-04-28: 4 mg via INTRAVENOUS
  Administered 2012-04-28 – 2012-04-29 (×3): 2 mg via INTRAVENOUS
  Administered 2012-04-29: 4 mg via INTRAVENOUS
  Administered 2012-04-29 (×3): 2 mg via INTRAVENOUS
  Filled 2012-04-28 (×2): qty 2
  Filled 2012-04-28: qty 1
  Filled 2012-04-28 (×3): qty 2
  Filled 2012-04-28 (×4): qty 1

## 2012-04-28 MED ORDER — ASPIRIN EC 325 MG PO TBEC
325.0000 mg | DELAYED_RELEASE_TABLET | Freq: Every day | ORAL | Status: DC
  Administered 2012-04-29 – 2012-04-30 (×2): 325 mg via ORAL
  Filled 2012-04-28 (×2): qty 1

## 2012-04-28 MED ORDER — FAMOTIDINE IN NACL 20-0.9 MG/50ML-% IV SOLN
20.0000 mg | Freq: Two times a day (BID) | INTRAVENOUS | Status: AC
  Administered 2012-04-28: 20 mg via INTRAVENOUS

## 2012-04-28 MED ORDER — ACETAMINOPHEN 160 MG/5ML PO SOLN
975.0000 mg | Freq: Four times a day (QID) | ORAL | Status: DC
  Filled 2012-04-28: qty 40.6

## 2012-04-28 MED ORDER — PROPOFOL 10 MG/ML IV EMUL
INTRAVENOUS | Status: DC | PRN
  Administered 2012-04-28: 50 mg via INTRAVENOUS

## 2012-04-28 MED ORDER — KETOROLAC TROMETHAMINE 15 MG/ML IJ SOLN
INTRAMUSCULAR | Status: AC
  Administered 2012-04-28: 15 mg via INTRAVENOUS
  Filled 2012-04-28: qty 1

## 2012-04-28 MED ORDER — LACTATED RINGERS IV SOLN
INTRAVENOUS | Status: DC | PRN
  Administered 2012-04-28 (×2): via INTRAVENOUS

## 2012-04-28 MED ORDER — MIDAZOLAM HCL 2 MG/2ML IJ SOLN: 2.0000 mg | INTRAMUSCULAR | Status: DC | PRN

## 2012-04-28 MED ORDER — ONDANSETRON HCL 4 MG/2ML IJ SOLN
4.0000 mg | Freq: Four times a day (QID) | INTRAMUSCULAR | Status: DC | PRN
  Administered 2012-04-28: 4 mg via INTRAVENOUS
  Filled 2012-04-28: qty 2

## 2012-04-28 MED ORDER — INSULIN ASPART 100 UNIT/ML ~~LOC~~ SOLN
0.0000 [IU] | SUBCUTANEOUS | Status: AC
  Administered 2012-04-28: 4 [IU] via SUBCUTANEOUS
  Administered 2012-04-28: 2 [IU] via SUBCUTANEOUS

## 2012-04-28 MED ORDER — METOPROLOL TARTRATE 1 MG/ML IV SOLN: 2.5000 mg | INTRAVENOUS | Status: DC | PRN

## 2012-04-28 MED ORDER — MAGNESIUM SULFATE 40 MG/ML IJ SOLN
4.0000 g | Freq: Once | INTRAMUSCULAR | Status: AC
  Administered 2012-04-28: 4 g via INTRAVENOUS
  Filled 2012-04-28: qty 100

## 2012-04-28 MED ORDER — LACTATED RINGERS IV SOLN: 500.0000 mL | Freq: Once | INTRAVENOUS | Status: AC | PRN

## 2012-04-28 MED ORDER — MIDAZOLAM HCL 5 MG/5ML IJ SOLN
INTRAMUSCULAR | Status: DC | PRN
  Administered 2012-04-28: 2 mg via INTRAVENOUS
  Administered 2012-04-28 (×2): 1 mg via INTRAVENOUS
  Administered 2012-04-28 (×3): 2 mg via INTRAVENOUS
  Administered 2012-04-28: 3 mg via INTRAVENOUS
  Administered 2012-04-28: 1 mg via INTRAVENOUS

## 2012-04-28 MED ORDER — HEMOSTATIC AGENTS (NO CHARGE) OPTIME
TOPICAL | Status: DC | PRN
  Administered 2012-04-28: 1 via TOPICAL

## 2012-04-28 MED ORDER — INSULIN REGULAR BOLUS VIA INFUSION
0.0000 [IU] | Freq: Three times a day (TID) | INTRAVENOUS | Status: DC
  Filled 2012-04-28: qty 10

## 2012-04-28 MED ORDER — INSULIN ASPART 100 UNIT/ML ~~LOC~~ SOLN
0.0000 [IU] | SUBCUTANEOUS | Status: DC
  Administered 2012-04-29: 2 [IU] via SUBCUTANEOUS

## 2012-04-28 MED ORDER — ACETAMINOPHEN 650 MG RE SUPP: 650.0000 mg | RECTAL | Status: AC

## 2012-04-28 MED ORDER — PHENYLEPHRINE HCL 10 MG/ML IJ SOLN
0.0000 ug/min | INTRAVENOUS | Status: DC
  Administered 2012-04-29: 25 ug/min via INTRAVENOUS
  Filled 2012-04-28 (×3): qty 2

## 2012-04-28 MED ORDER — ASPIRIN 81 MG PO CHEW: 324.0000 mg | CHEWABLE_TABLET | Freq: Every day | ORAL | Status: DC

## 2012-04-28 MED ORDER — DEXMEDETOMIDINE HCL IN NACL 200 MCG/50ML IV SOLN: 0.1000 ug/kg/h | INTRAVENOUS | Status: DC

## 2012-04-28 MED ORDER — OXYCODONE HCL 5 MG PO TABS
5.0000 mg | ORAL_TABLET | ORAL | Status: DC | PRN
  Administered 2012-04-29: 5 mg via ORAL
  Filled 2012-04-28: qty 1

## 2012-04-28 MED ORDER — LACTATED RINGERS IV SOLN: INTRAVENOUS | Status: DC

## 2012-04-28 MED ORDER — METOPROLOL TARTRATE 25 MG/10 ML ORAL SUSPENSION
12.5000 mg | Freq: Two times a day (BID) | ORAL | Status: DC
  Filled 2012-04-28 (×3): qty 5

## 2012-04-28 MED ORDER — DOCUSATE SODIUM 100 MG PO CAPS
200.0000 mg | ORAL_CAPSULE | Freq: Every day | ORAL | Status: DC
  Administered 2012-04-29 – 2012-04-30 (×2): 200 mg via ORAL
  Filled 2012-04-28: qty 2
  Filled 2012-04-28 (×2): qty 1

## 2012-04-28 MED ORDER — VANCOMYCIN HCL IN DEXTROSE 1-5 GM/200ML-% IV SOLN
1000.0000 mg | Freq: Once | INTRAVENOUS | Status: AC
  Administered 2012-04-28: 1000 mg via INTRAVENOUS
  Filled 2012-04-28: qty 200

## 2012-04-28 MED ORDER — ROCURONIUM BROMIDE 100 MG/10ML IV SOLN
INTRAVENOUS | Status: DC | PRN
  Administered 2012-04-28 (×2): 50 mg via INTRAVENOUS

## 2012-04-28 MED ORDER — SODIUM CHLORIDE 0.9 % IV SOLN
INTRAVENOUS | Status: DC
  Administered 2012-04-28: 20 mL via INTRAVENOUS

## 2012-04-28 MED ORDER — METOPROLOL TARTRATE 12.5 MG HALF TABLET
12.5000 mg | ORAL_TABLET | Freq: Two times a day (BID) | ORAL | Status: DC
  Filled 2012-04-28 (×3): qty 1

## 2012-04-28 SURGICAL SUPPLY — 97 items
ATTRACTOMAT 16X20 MAGNETIC DRP (DRAPES) ×2 IMPLANT
BAG DECANTER FOR FLEXI CONT (MISCELLANEOUS) ×2 IMPLANT
BANDAGE ELASTIC 4 VELCRO ST LF (GAUZE/BANDAGES/DRESSINGS) ×4 IMPLANT
BANDAGE ELASTIC 6 VELCRO ST LF (GAUZE/BANDAGES/DRESSINGS) ×4 IMPLANT
BANDAGE GAUZE ELAST BULKY 4 IN (GAUZE/BANDAGES/DRESSINGS) ×4 IMPLANT
BASKET HEART (ORDER IN 25'S) (MISCELLANEOUS) ×1
BASKET HEART (ORDER IN 25S) (MISCELLANEOUS) ×1 IMPLANT
BLADE STERNUM SYSTEM 6 (BLADE) ×2 IMPLANT
CANISTER SUCTION 2500CC (MISCELLANEOUS) ×2 IMPLANT
CATH ROBINSON RED A/P 18FR (CATHETERS) ×4 IMPLANT
CATH THORACIC 28FR (CATHETERS) ×4 IMPLANT
CATH THORACIC 28FR RT ANG (CATHETERS) IMPLANT
CATH THORACIC 36FR (CATHETERS) ×2 IMPLANT
CATH THORACIC 36FR RT ANG (CATHETERS) ×2 IMPLANT
CLIP TI MEDIUM 24 (CLIP) IMPLANT
CLIP TI WIDE RED SMALL 24 (CLIP) ×4 IMPLANT
CLOTH BEACON ORANGE TIMEOUT ST (SAFETY) ×2 IMPLANT
COVER SURGICAL LIGHT HANDLE (MISCELLANEOUS) ×4 IMPLANT
CRADLE DONUT ADULT HEAD (MISCELLANEOUS) ×2 IMPLANT
DRAPE CARDIOVASCULAR INCISE (DRAPES) ×1
DRAPE SLUSH MACHINE 52X66 (DRAPES) IMPLANT
DRAPE SLUSH/WARMER DISC (DRAPES) ×2 IMPLANT
DRAPE SRG 135X102X78XABS (DRAPES) ×1 IMPLANT
DRSG COVADERM 4X14 (GAUZE/BANDAGES/DRESSINGS) ×2 IMPLANT
ELECT CAUTERY BLADE 6.4 (BLADE) ×2 IMPLANT
ELECT REM PT RETURN 9FT ADLT (ELECTROSURGICAL) ×4
ELECTRODE REM PT RTRN 9FT ADLT (ELECTROSURGICAL) ×2 IMPLANT
GLOVE BIO SURGEON STRL SZ 6 (GLOVE) IMPLANT
GLOVE BIO SURGEON STRL SZ 6.5 (GLOVE) IMPLANT
GLOVE BIO SURGEON STRL SZ7 (GLOVE) IMPLANT
GLOVE BIO SURGEON STRL SZ7.5 (GLOVE) ×2 IMPLANT
GLOVE BIOGEL PI IND STRL 6 (GLOVE) ×1 IMPLANT
GLOVE BIOGEL PI IND STRL 6.5 (GLOVE) ×3 IMPLANT
GLOVE BIOGEL PI IND STRL 7.0 (GLOVE) ×4 IMPLANT
GLOVE BIOGEL PI INDICATOR 6 (GLOVE) ×1
GLOVE BIOGEL PI INDICATOR 6.5 (GLOVE) ×3
GLOVE BIOGEL PI INDICATOR 7.0 (GLOVE) ×4
GLOVE EUDERMIC 7 POWDERFREE (GLOVE) ×4 IMPLANT
GLOVE ORTHO TXT STRL SZ7.5 (GLOVE) IMPLANT
GOWN PREVENTION PLUS XLARGE (GOWN DISPOSABLE) ×6 IMPLANT
GOWN STRL NON-REIN LRG LVL3 (GOWN DISPOSABLE) ×12 IMPLANT
HEMOSTAT POWDER SURGIFOAM 1G (HEMOSTASIS) ×6 IMPLANT
HEMOSTAT SURGICEL 2X14 (HEMOSTASIS) ×2 IMPLANT
INSERT FOGARTY 61MM (MISCELLANEOUS) IMPLANT
INSERT FOGARTY XLG (MISCELLANEOUS) IMPLANT
KIT BASIN OR (CUSTOM PROCEDURE TRAY) ×2 IMPLANT
KIT CATH CPB BARTLE (MISCELLANEOUS) ×2 IMPLANT
KIT ROOM TURNOVER OR (KITS) ×2 IMPLANT
KIT SUCTION CATH 14FR (SUCTIONS) ×2 IMPLANT
KIT VASOVIEW W/TROCAR VH 2000 (KITS) ×2 IMPLANT
NS IRRIG 1000ML POUR BTL (IV SOLUTION) ×10 IMPLANT
PACK OPEN HEART (CUSTOM PROCEDURE TRAY) ×2 IMPLANT
PAD ARMBOARD 7.5X6 YLW CONV (MISCELLANEOUS) ×4 IMPLANT
PENCIL BUTTON HOLSTER BLD 10FT (ELECTRODE) ×2 IMPLANT
PUNCH AORTIC ROTATE 4.0MM (MISCELLANEOUS) IMPLANT
PUNCH AORTIC ROTATE 4.5MM 8IN (MISCELLANEOUS) ×2 IMPLANT
PUNCH AORTIC ROTATE 5MM 8IN (MISCELLANEOUS) IMPLANT
SET CARDIOPLEGIA MPS 5001102 (MISCELLANEOUS) ×2 IMPLANT
SPONGE GAUZE 4X4 12PLY (GAUZE/BANDAGES/DRESSINGS) ×12 IMPLANT
SPONGE INTESTINAL PEANUT (DISPOSABLE) IMPLANT
SPONGE LAP 18X18 X RAY DECT (DISPOSABLE) ×2 IMPLANT
SPONGE LAP 4X18 X RAY DECT (DISPOSABLE) ×2 IMPLANT
SUT BONE WAX W31G (SUTURE) ×2 IMPLANT
SUT MNCRL AB 4-0 PS2 18 (SUTURE) ×4 IMPLANT
SUT PROLENE 3 0 SH DA (SUTURE) IMPLANT
SUT PROLENE 3 0 SH1 36 (SUTURE) ×2 IMPLANT
SUT PROLENE 4 0 RB 1 (SUTURE)
SUT PROLENE 4 0 SH DA (SUTURE) IMPLANT
SUT PROLENE 4-0 RB1 .5 CRCL 36 (SUTURE) IMPLANT
SUT PROLENE 5 0 C 1 36 (SUTURE) IMPLANT
SUT PROLENE 6 0 C 1 30 (SUTURE) IMPLANT
SUT PROLENE 7 0 BV 1 (SUTURE) ×2 IMPLANT
SUT PROLENE 7 0 BV1 MDA (SUTURE) ×2 IMPLANT
SUT PROLENE 8 0 BV175 6 (SUTURE) IMPLANT
SUT SILK  1 MH (SUTURE) ×1
SUT SILK 1 MH (SUTURE) ×1 IMPLANT
SUT STEEL STERNAL CCS#1 18IN (SUTURE) IMPLANT
SUT STEEL SZ 6 DBL 3X14 BALL (SUTURE) ×6 IMPLANT
SUT VIC AB 1 CTX 36 (SUTURE) ×2
SUT VIC AB 1 CTX36XBRD ANBCTR (SUTURE) ×2 IMPLANT
SUT VIC AB 2-0 CT1 27 (SUTURE) ×2
SUT VIC AB 2-0 CT1 TAPERPNT 27 (SUTURE) ×2 IMPLANT
SUT VIC AB 2-0 CTX 27 (SUTURE) IMPLANT
SUT VIC AB 3-0 SH 27 (SUTURE)
SUT VIC AB 3-0 SH 27X BRD (SUTURE) IMPLANT
SUT VIC AB 3-0 X1 27 (SUTURE) IMPLANT
SUT VICRYL 4-0 PS2 18IN ABS (SUTURE) IMPLANT
SUTURE E-PAK OPEN HEART (SUTURE) ×2 IMPLANT
SYSTEM SAHARA CHEST DRAIN ATS (WOUND CARE) ×2 IMPLANT
TAPE CLOTH SURG 4X10 WHT LF (GAUZE/BANDAGES/DRESSINGS) ×6 IMPLANT
TOWEL OR 17X24 6PK STRL BLUE (TOWEL DISPOSABLE) ×4 IMPLANT
TOWEL OR 17X26 10 PK STRL BLUE (TOWEL DISPOSABLE) ×2 IMPLANT
TRAY FOLEY IC TEMP SENS 14FR (CATHETERS) ×2 IMPLANT
TUBE SUCT INTRACARD DLP 20F (MISCELLANEOUS) ×2 IMPLANT
TUBING INSUFFLATION 10FT LAP (TUBING) ×2 IMPLANT
UNDERPAD 30X30 INCONTINENT (UNDERPADS AND DIAPERS) ×2 IMPLANT
WATER STERILE IRR 1000ML POUR (IV SOLUTION) ×4 IMPLANT

## 2012-04-28 NOTE — Brief Op Note (Signed)
04/25/2012 - 04/28/2012                   [address]            [place] [address]          [phone]    04/25/2012 - 04/28/2012  12:43 PM  PATIENT:  Jonathan Escobar  66 y.o. male  PRE-OPERATIVE DIAGNOSIS:  CAD  POST-OPERATIVE DIAGNOSIS:  Coronary Artery Disease  PROCEDURE:  Procedure(s): CORONARY ARTERY BYPASS GRAFTING (CABG)X4 LIMA-LAD; SEQ SVG-PD-PL; SVG-DIAG EVH BILAT THIGH( RIGHT GSV TOO SMALL)  SURGEON:  Surgeon(s): Alleen Borne, MD  PHYSICIAN ASSISTANT: WAYNE GOLD PA-C  ANESTHESIA:   general  PATIENT CONDITION:  ICU - intubated and hemodynamically stable.  PRE-OPERATIVE WEIGHT: 94.5kg   COMPLICATIONS: NO KNOWN

## 2012-04-28 NOTE — Progress Notes (Signed)
Patient ID: Jonathan Escobar, male   DOB: 14-Sep-1945, 66 y.o.   MRN: [id number] SICU evening rounds  Hemodynamically stable Extubated.  Complains of back pain. Will add Toradol  Urine output ok CT output low.  CBC    Component Value Date/Time   WBC 12.7* 04/28/2012 1500   RBC 3.96* 04/28/2012 1500   HGB 12.2* 04/28/2012 1514   HCT 36.0* 04/28/2012 1514   PLT 169 04/28/2012 1500   MCV 89.6 04/28/2012 1500   MCH 31.1 04/28/2012 1500   MCHC 34.6 04/28/2012 1500   RDW 13.0 04/28/2012 1500   LYMPHSABS 0.6* 04/25/2012 1515   MONOABS 0.4 04/25/2012 1515   EOSABS 0.0 04/25/2012 1515   BASOSABS 0.0 04/25/2012 1515    BMET    Component Value Date/Time   NA 139 04/28/2012 1514   K 3.9 04/28/2012 1514   CL 104 04/28/2012 0630   CO2 25 04/28/2012 0630   GLUCOSE 118* 04/28/2012 1514   BUN 9 04/28/2012 0630   CREATININE 0.85 04/28/2012 0630   CALCIUM 9.4 04/28/2012 0630   GFRNONAA 89* 04/28/2012 0630   GFRAA >90 04/28/2012 0630

## 2012-04-28 NOTE — Anesthesia Procedure Notes (Signed)
Procedure Name: Intubation Date/Time: 04/28/2012 8:52 AM Performed by: Arlice Colt B Pre-anesthesia Checklist: Patient identified, Emergency Drugs available, Suction available, Patient being monitored and Timeout performed Patient Re-evaluated:Patient Re-evaluated prior to inductionOxygen Delivery Method: Circle system utilized Preoxygenation: Pre-oxygenation with 100% oxygen Intubation Type: IV induction Ventilation: Mask ventilation without difficulty Laryngoscope Size: Mac and 3 Grade View: Grade I Tube type: Oral Tube size: 8.0 mm Number of attempts: 1 Airway Equipment and Method: Stylet Placement Confirmation: ETT inserted through vocal cords under direct vision,  positive ETCO2 and breath sounds checked- equal and bilateral Secured at: 22 cm Tube secured with: Tape Dental Injury: Teeth and Oropharynx as per pre-operative assessment  Comments: Intubation performed by Ether Griffins, SRNA

## 2012-04-28 NOTE — Procedures (Signed)
Extubation Procedure Note  Patient Details:   Name: Jonathan Escobar DOB: [birth year] MRN: [id number]   Airway Documentation:     Evaluation  O2 sats: stable throughout Complications: No apparent complications Patient did tolerate procedure well. Bilateral Breath Sounds: Clear Suctioning: Oral Yes pt able to vocalize.   Pt extubated at this time per SICU protocol and placed on 4L [place]. Pt able to breathe around deflated cuff. Pt has strong adequate cough. VS stable. NIF -20cm H20, VC 0.9 L, Incentive Spirometer 750 mL x5. No complications noted. No stridor noted. RT will continue to monitor.   [facility] 04/28/2012, 6:10 PM

## 2012-04-28 NOTE — Progress Notes (Signed)
Pt watched educational videos of CABG. Instructed on the use of incentive spirometer. Pt given booklet with information about CABG. Pt able to return demonstration of incentive spirometer and had no questions about the videos or booklet.

## 2012-04-28 NOTE — Anesthesia Postprocedure Evaluation (Signed)
  Anesthesia Post-op Note  Patient: Jonathan Escobar  Procedure(s) Performed: Procedure(s) (LRB): CORONARY ARTERY BYPASS GRAFTING (CABG) (N/A)  Patient Location: SICU  Anesthesia Type: General  Level of Consciousness: sedated and unresponsive  Airway and Oxygen Therapy: Patient remains intubated per anesthesia plan  Post-op Pain: none  Post-op Assessment: Post-op Vital signs reviewed, Patient's Cardiovascular Status Stable and Respiratory Function Stable  Post-op Vital Signs: Reviewed and stable  Complications: No apparent anesthesia complications

## 2012-04-28 NOTE — Transfer of Care (Signed)
Immediate Anesthesia Transfer of Care Note  Patient: Jonathan Escobar  Procedure(s) Performed: Procedure(s) (LRB): CORONARY ARTERY BYPASS GRAFTING (CABG) (N/A)  Patient Location: SICU  Anesthesia Type: General  Level of Consciousness: Patient remains intubated per anesthesia plan  Airway & Oxygen Therapy: Patient remains intubated per anesthesia plan and Patient placed on Ventilator (see vital sign flow sheet for setting)  Post-op Assessment: Report given to PACU RN and Post -op Vital signs reviewed and stable  Post vital signs: Reviewed and stable  Complications: No apparent anesthesia complications

## 2012-04-28 NOTE — Anesthesia Preprocedure Evaluation (Signed)
Anesthesia Evaluation  Patient identified by MRN, date of birth, ID band Patient awake    Reviewed: Allergy & Precautions, H&P , NPO status , Patient's Chart, lab work & pertinent test results, reviewed documented beta blocker date and time   Airway Mallampati: I TM Distance: >3 FB Neck ROM: Full    Dental  (+) Teeth Intact   Pulmonary          Cardiovascular hypertension, Pt. on medications and Pt. on home beta blockers + CAD, + Past MI and + Cardiac Stents Rhythm:Regular Rate:Normal     Neuro/Psych    GI/Hepatic   Endo/Other    Renal/GU      Musculoskeletal   Abdominal   Peds  Hematology   Anesthesia Other Findings   Reproductive/Obstetrics                           Anesthesia Physical Anesthesia Plan  ASA: III  Anesthesia Plan: General   Post-op Pain Management:    Induction: Intravenous  Airway Management Planned: Oral ETT  Additional Equipment: Arterial line, CVP and PA Cath  Intra-op Plan:   Post-operative Plan: Post-operative intubation/ventilation  Informed Consent: I have reviewed the patients History and Physical, chart, labs and discussed the procedure including the risks, benefits and alternatives for the proposed anesthesia with the patient or authorized representative who has indicated his/her understanding and acceptance.   Dental advisory given  Plan Discussed with: Anesthesiologist and Surgeon  Anesthesia Plan Comments:         Anesthesia Quick Evaluation

## 2012-04-29 ENCOUNTER — Inpatient Hospital Stay (HOSPITAL_COMMUNITY): Payer: PRIVATE HEALTH INSURANCE

## 2012-04-29 ENCOUNTER — Encounter (HOSPITAL_COMMUNITY): Payer: Self-pay | Admitting: Cardiology

## 2012-04-29 DIAGNOSIS — Z951 Presence of aortocoronary bypass graft: Secondary | ICD-10-CM

## 2012-04-29 LAB — CBC
HCT: 36 % — ABNORMAL LOW (ref 39.0–52.0)
HCT: 33 % — ABNORMAL LOW (ref 39.0–52.0)
Hemoglobin: 12.3 g/dL — ABNORMAL LOW (ref 13.0–17.0)
MCH: 30.6 pg (ref 26.0–34.0)
MCHC: 34.2 g/dL (ref 30.0–36.0)
MCHC: 33.6 g/dL (ref 30.0–36.0)
MCV: 89.6 fL (ref 78.0–100.0)
MCV: 90.7 fL (ref 78.0–100.0)
Platelets: 191 10*3/uL (ref 150–400)
Platelets: 171 10*3/uL (ref 150–400)
RBC: 4.02 MIL/uL — ABNORMAL LOW (ref 4.22–5.81)
RDW: 13 % (ref 11.5–15.5)
RDW: 13.3 % (ref 11.5–15.5)
WBC: 11.2 10*3/uL — ABNORMAL HIGH (ref 4.0–10.5)

## 2012-04-29 LAB — BASIC METABOLIC PANEL
GFR calc Af Amer: >90 mL/min (ref >90)
GFR calc non Af Amer: >90 mL/min (ref >90)
Glucose, Bld: 131 mg/dL — ABNORMAL HIGH (ref 70–99)
Potassium: 4.2 mEq/L (ref 3.5–5.1)
Sodium: 133 mEq/L — ABNORMAL LOW (ref 135–145)

## 2012-04-29 LAB — POCT I-STAT, CHEM 8
BUN: 7 mg/dL (ref 6–23)
Chloride: 100 mEq/L (ref 96–112)
Glucose, Bld: 112 mg/dL — ABNORMAL HIGH (ref 70–99)
HCT: 33 % — ABNORMAL LOW (ref 39.0–52.0)
Potassium: 3.5 mEq/L (ref 3.5–5.1)

## 2012-04-29 LAB — CREATININE, SERUM: GFR calc non Af Amer: >90 mL/min (ref >90)

## 2012-04-29 LAB — MAGNESIUM
Magnesium: 2.7 mg/dL — ABNORMAL HIGH (ref 1.5–2.5)
Magnesium: 2.6 mg/dL — ABNORMAL HIGH (ref 1.5–2.5)

## 2012-04-29 LAB — GLUCOSE, CAPILLARY
Glucose-Capillary: 163 mg/dL — ABNORMAL HIGH (ref 70–99)
Glucose-Capillary: 121 mg/dL — ABNORMAL HIGH (ref 70–99)

## 2012-04-29 MED ORDER — INSULIN ASPART 100 UNIT/ML ~~LOC~~ SOLN: 0.0000 [IU] | SUBCUTANEOUS | Status: DC

## 2012-04-29 MED ORDER — PANTOPRAZOLE SODIUM 40 MG PO TBEC
40.0000 mg | DELAYED_RELEASE_TABLET | Freq: Every day | ORAL | Status: DC
  Administered 2012-04-29 – 2012-04-30 (×2): 40 mg via ORAL
  Filled 2012-04-29 (×2): qty 1

## 2012-04-29 MED ORDER — HYDROMORPHONE HCL 2 MG PO TABS
2.0000 mg | ORAL_TABLET | ORAL | Status: DC | PRN
  Administered 2012-04-29 – 2012-04-30 (×5): 2 mg via ORAL
  Filled 2012-04-29 (×5): qty 1

## 2012-04-29 MED ORDER — POTASSIUM CHLORIDE 10 MEQ/50ML IV SOLN
INTRAVENOUS | Status: AC
  Administered 2012-04-29: 10 meq via INTRAVENOUS
  Filled 2012-04-29: qty 50

## 2012-04-29 MED ORDER — POTASSIUM CHLORIDE 10 MEQ/50ML IV SOLN
10.0000 meq | INTRAVENOUS | Status: AC
  Administered 2012-04-29 (×3): 10 meq via INTRAVENOUS
  Filled 2012-04-29: qty 50

## 2012-04-29 MED ORDER — TRAMADOL HCL 50 MG PO TABS
50.0000 mg | ORAL_TABLET | Freq: Four times a day (QID) | ORAL | Status: DC | PRN
  Administered 2012-04-29: 50 mg via ORAL
  Filled 2012-04-29: qty 1

## 2012-04-29 MED ORDER — METOPROLOL TARTRATE 12.5 MG HALF TABLET: 12.5000 mg | ORAL_TABLET | Freq: Two times a day (BID) | ORAL | Status: DC

## 2012-04-29 MED ORDER — METOPROLOL TARTRATE 25 MG/10 ML ORAL SUSPENSION: 12.5000 mg | Freq: Two times a day (BID) | ORAL | Status: DC

## 2012-04-29 MED ORDER — MIDAZOLAM HCL 2 MG/2ML IJ SOLN
2.0000 mg | Freq: Once | INTRAMUSCULAR | Status: AC
  Administered 2012-04-29: 2 mg via INTRAVENOUS
  Filled 2012-04-29: qty 2

## 2012-04-29 MED FILL — Potassium Chloride Inj 2 mEq/ML: INTRAVENOUS | Qty: 40 | Status: AC

## 2012-04-29 MED FILL — Magnesium Sulfate Inj 50%: INTRAMUSCULAR | Qty: 10 | Status: AC

## 2012-04-29 NOTE — Accreditation Note (Signed)
Patient examined and record reviewed.Hemodynamics stable,labs satisfactory.Patient had stable day.Continue current care.  Resting comfortably  Lab Results  Component Value Date   WBC 9.6 04/29/2012   HGB 11.2* 04/29/2012   HCT 33.0* 04/29/2012   MCV 90.7 04/29/2012   PLT 171 04/29/2012   Lab Results  Component Value Date   CREATININE 0.80 04/29/2012   BUN 7 04/29/2012   NA 136 04/29/2012   K 3.5 04/29/2012   CL 100 04/29/2012   CO2 23 04/29/2012   VAN TRIGT III,Likisha Alles 04/29/2012

## 2012-04-29 NOTE — Progress Notes (Addendum)
1 Day Post-Op Procedure(s) (LRB): CORONARY ARTERY BYPASS GRAFTING (CABG) (N/A)  Subjective: Patient with some incisional pain and pain at chest tube sites.  Objective: Vital signs in last 24 hours: Patient Vitals for the past 24 hrs:  BP Temp Temp src Pulse Resp SpO2 Height Weight  04/29/12 0715 - 98.4 F (36.9 C) - 80  23  98 % - -  04/29/12 0700 104/65 mmHg 98.4 F (36.9 C) - 80  15  98 % - -  04/29/12 0645 84/53 mmHg 98.4 F (36.9 C) - 88  21  96 % - -  04/29/12 0630 87/55 mmHg 98.4 F (36.9 C) - 88  17  96 % - -  04/29/12 0615 104/61 mmHg 98.6 F (37 C) - 86  16  96 % - -  04/29/12 0600 98/55 mmHg 98.6 F (37 C) - 89  19  96 % - 216 lb 14.9 oz (98.4 kg)  04/29/12 0545 120/95 mmHg 98.6 F (37 C) - 89  20  96 % - -  04/29/12 0530 113/77 mmHg 98.6 F (37 C) - 89  24  95 % - -  04/29/12 0515 - 98.4 F (36.9 C) - 89  23  98 % - -  04/29/12 0500 105/61 mmHg 98.2 F (36.8 C) - 90  18  97 % - -  04/29/12 0445 100/59 mmHg 98.4 F (36.9 C) - 89  16  97 % - -  04/29/12 0430 106/66 mmHg 98.2 F (36.8 C) - 89  18  97 % - -  04/29/12 0415 101/60 mmHg 98.4 F (36.9 C) - 89  16  97 % - -  04/29/12 0400 97/73 mmHg 98.6 F (37 C) Core 89  18  97 % - -  04/29/12 0345 101/65 mmHg 98.6 F (37 C) - 82  16  97 % - -  04/29/12 0330 113/72 mmHg 98.6 F (37 C) - 80  18  97 % - -  04/29/12 0315 107/72 mmHg 98.6 F (37 C) - 80  19  97 % - -  04/29/12 0300 110/58 mmHg 98.6 F (37 C) - 78  18  97 % - -  04/29/12 0245 99/62 mmHg 98.6 F (37 C) - 80  16  98 % - -  04/29/12 0230 101/58 mmHg 98.6 F (37 C) - 82  17  98 % - -  04/29/12 0215 102/61 mmHg 98.4 F (36.9 C) - 82  16  98 % - -  04/29/12 0200 105/66 mmHg 98.4 F (36.9 C) - 81  16  98 % - -  04/29/12 0145 - 98.6 F (37 C) - 88  17  97 % - -  04/29/12 0130 - 98.6 F (37 C) - 81  15  98 % - -  04/29/12 0115 - 98.4 F (36.9 C) - 84  19  97 % - -  04/29/12 0100 108/68 mmHg 98.4 F (36.9 C) - 85  20  95 % - -  04/29/12 0045 - 98.4  F (36.9 C) - 80  16  97 % - -  04/29/12 0030 - 98.4 F (36.9 C) - 79  16  97 % - -  04/29/12 0015 - 98.4 F (36.9 C) - 80  18  98 % - -  04/29/12 0000 115/69 mmHg 98.2 F (36.8 C) Core 80  18  97 % - -  04/28/12 2345 - 98.2 F (36.8 C) - 84  24  97 % - -  04/28/12 2330 - 98.4 F (36.9 C) - 80  15  97 % - -  04/28/12 2315 - 98.4 F (36.9 C) - 80  15  97 % - -  04/28/12 2300 100/60 mmHg 98.4 F (36.9 C) - 80  15  96 % - -  04/28/12 2245 - 98.4 F (36.9 C) - 80  15  95 % - -  04/28/12 2230 - 98.4 F (36.9 C) - 80  15  97 % - -  04/28/12 2215 - 98.4 F (36.9 C) - 80  18  97 % - -  04/28/12 2200 112/66 mmHg 98.4 F (36.9 C) - 80  16  98 % - -  04/28/12 2145 - 98.4 F (36.9 C) - 80  15  98 % - -  04/28/12 2130 - 98.4 F (36.9 C) - 80  16  98 % - -  04/28/12 2115 - 98.4 F (36.9 C) - 80  19  97 % - -  04/28/12 2100 111/71 mmHg 98.4 F (36.9 C) - 80  21  95 % - -  04/28/12 2045 - 98.6 F (37 C) - 79  24  96 % - -  04/28/12 2030 - 98.8 F (37.1 C) - 79  23  97 % - -  04/28/12 2015 - 98.8 F (37.1 C) - 79  21  97 % - -  04/28/12 2000 - 98.8 F (37.1 C) Core 79  12  96 % - -  04/28/12 1945 - 98.8 F (37.1 C) - 79  25  98 % - -  04/28/12 1930 - 98.8 F (37.1 C) - 79  22  98 % - -  04/28/12 1915 - 98.8 F (37.1 C) - 80  19  97 % - -  04/28/12 1900 - 98.8 F (37.1 C) - 80  26  97 % - -  04/28/12 1845 - 98.8 F (37.1 C) - 80  21  96 % - -  04/28/12 1830 - 98.6 F (37 C) - 80  20  96 % - -  04/28/12 1815 - 98.4 F (36.9 C) - 80  9  97 % - -  04/28/12 1800 - 98.2 F (36.8 C) - 80  23  94 % - -  04/28/12 1755 95/58 mmHg 98.1 F (36.7 C) - 80  22  100 % - -  04/28/12 1745 - 98.1 F (36.7 C) - 80  19  99 % - -  04/28/12 1730 - 97.9 F (36.6 C) - 80  18  99 % - -  04/28/12 1718 97/61 mmHg - - 80  20  100 % - -  04/28/12 1715 - 97.5 F (36.4 C) - 80  14  99 % - -  04/28/12 1700 - 97.3 F (36.3 C) - 80  11  99 % - -  04/28/12 1655 102/65 mmHg - - 80  11  100 % - -    04/28/12 1645 - 97 F (36.1 C) - 80  13  100 % - -  04/28/12 1630 - 96.8 F (36 C) - 79  14  100 % - -  04/28/12 1615 - 96.3 F (35.7 C) - 80  12  100 % - -  04/28/12 1600 - 96.4 F (35.8 C) - 80  16  99 % - -  04/28/12 1545 - 96.4 F (35.8 C) - 80  13  99 % - -  04/28/12 1530 - 96.4 F (35.8 C) - 80  12  99 % - -  04/28/12 1518 - 96.4 F (35.8 C) - 80  12  98 % - -  04/28/12 1515 - 96.4 F (35.8 C) - 80  12  98 % - -  04/28/12 1500 - 96.3 F (35.7 C) - 80  13  100 % - -  04/28/12 1445 - 96.6 F (35.9 C) - 80  12  97 % - -  04/28/12 1430 - - - 80  18  94 % 5\' 10"  (1.778 m) 208 lb 5.4 oz (94.5 kg)  04/28/12 1425 122/83 mmHg - - 80  12  97 % - -   Pre op weight 94.5 kg Current Weight  04/29/12 216 lb 14.9 oz (98.4 kg)    Hemodynamic parameters for last 24 hours: PAP: (20-37)/(6-20) 37/20 mmHg CO:  [2.5 L/min-5.4 L/min] 5.1 L/min CI:  [1.2 L/min/m2-2.5 L/min/m2] 2.4 L/min/m2  Intake/Output from previous day: 08/28 0701 - 08/29 0700 In: 4413.1 [I.V.:3231.1; Blood:600; IV Piggyback:582] Out: 6300 [Urine:4700; Blood:1200; Chest Tube:400]   Physical Exam:  Cardiovascular: RRR,distant heart sounds. Pulmonary: Clear to auscultation bilaterally; no rales, wheezes, or rhonchi. Abdomen: Soft, non tender, bowel sounds present. Extremities: Mild bilateral lower extremity edema. Wounds: Clean and dry.  No erythema or signs of infection.  Lab Results: CBC: Basename 04/29/12 0415 04/28/12 2100  WBC 11.2* 10.8*  HGB 12.3* 12.7*12.2*  HCT 36.0* 37.4*36.0*  PLT 191 176   BMET:  Basename 04/29/12 0415 04/28/12 2100 04/28/12 0630  NA 133* 136 --  K 4.2 4.4 --  CL 101 104 --  CO2 23 -- 25  GLUCOSE 131* 151* --  BUN 10 7 --  CREATININE 0.79 0.730.80 --  CALCIUM 8.6 -- 9.4    PT/INR:  Lab Results  Component Value Date   INR 1.40 04/28/2012   ABG:  INR: Will add last result for INR, ABG once components are confirmed Will add last 4 CBG results once components are  confirmed  Assessment/Plan:  1. CV - SR. On Lopressor 12.5 bid-will resume in am as still on Neo synephrine.Wean Neo as tolerates. 2.  Pulmonary - Chest tube output 400 cc.CXR this am shows low lung volumes,bibasilar atelectasis L>R, no ptx. Encourage incentive spirometer. 3. Volume Overload - Will begin gentle diuresis soon. 4.  Acute blood loss anemia - H and H stable at 12.3 and 36. 5.Mild hyponatremia 6.Pre op HGA1C 5.9.CBGs 122/163/118.Will continue diabetic diet and will need follow up as an outpatient. 7.Remove Theone Murdoch, a line-see progression orders  ZIMMERMAN,DONIELLE MPA-C 04/29/2012    Chart reviewed, patient examined, agree with above. CXR ok but low lung volumes.  ECG:  NSR, IMI, no acute change

## 2012-04-29 NOTE — Progress Notes (Signed)
Subjective: No specific complaints, tired, surgical pain  Objective: Vital signs in last 24 hours: Temp:  [96.3 F (35.7 C)-98.8 F (37.1 C)] 98.4 F (36.9 C) (08/29 0715) Pulse Rate:  [78-90] 80  (08/29 0715) Resp:  [9-26] 23  (08/29 0715) BP: (84-122)/(53-95) 104/65 mmHg (08/29 0700) SpO2:  [94 %-100 %] 98 % (08/29 0715) Arterial Line BP: (81-142)/(46-93) 115/65 mmHg (08/29 0715) FiO2 (%):  [40 %-50 %] 40 % (08/28 1718) Weight:  [94.5 kg (208 lb 5.4 oz)-98.4 kg (216 lb 14.9 oz)] 98.4 kg (216 lb 14.9 oz) (08/29 0600) Weight change: 0 kg (0 lb) Last BM Date: 04/25/12 Intake/Output from previous day: -1936 08/28 0701 - 08/29 0700 In: 4413.1 [I.V.:3231.1; Blood:600; IV Piggyback:582] Out: 6300 [Urine:4700; Blood:1200; Chest Tube:400] Intake/Output this shift:    PE: General: alert and oriented.  MAE, follows commands. No JVD Heart:S1S2 RRR  No rub Lungs:clear without rales, or rhonchi Abd:+ BS, soft, non tender RUE:AVWU lower ext. edema    Hemodynamic parameters for last 24 hours:  PAP: (20-37)/(6-20) 37/20 mmHg  CO: [2.5 L/min-5.4 L/min] 5.1 L/min  CI: [1.2 L/min/m2-2.5 L/min/m2] 2.4 L/min/m2      Lab Results:  Basename 04/29/12 0415 04/28/12 2100  WBC 11.2* 10.8*  HGB 12.3* 12.7*12.2*  HCT 36.0* 37.4*36.0*  PLT 191 176   BMET  Basename 04/29/12 0415 04/28/12 2100 04/28/12 0630  NA 133* 136 --  K 4.2 4.4 --  CL 101 104 --  CO2 23 -- 25  GLUCOSE 131* 151* --  BUN 10 7 --  CREATININE 0.79 0.730.80 --  CALCIUM 8.6 -- 9.4   No results found for this basename: TROPONINI:2,CK,MB:2 in the last 72 hours  No results found for this basename: CHOL,  HDL,  LDLCALC,  LDLDIRECT,  TRIG,  CHOLHDL   Lab Results  Component Value Date   HGBA1C 5.9* 04/25/2012     Lab Results  Component Value Date   TSH 0.925 04/25/2012      EKG: Orders placed during the hospital encounter of 04/25/12  . EKG 12-LEAD  . EKG 12-LEAD  . EKG 12-LEAD  . EKG 12-LEAD  . EKG  12-LEAD  . EKG 12-LEAD  . EKG 12-LEAD  . EKG 12-LEAD  . EKG 12-LEAD  . EKG 12-LEAD    Studies/Results: Dg Chest Portable 1 View In Am  04/29/2012  *RADIOLOGY REPORT*  Clinical Data: CABG procedure.  PORTABLE CHEST - 1 VIEW  Comparison: 04/28/2012  Findings: Endotracheal tube and nasogastric have been removed.  The chest drains are still in place.  Swan-Ganz catheter in the distal main pulmonary artery region.  No evidence for a pneumothorax. Patchy interstitial densities in left lung may represent atelectasis or asymmetric edema.  Heart size is stable.  There is gas within the stomach.  IMPRESSION: Interstitial densities in the left lung may represent atelectasis and/or mild edema.  Negative for a pneumothorax.  Support apparatuses as described.   Original Report Authenticated By: Richarda Overlie, M.D.    Dg Chest Portable 1 View  04/28/2012  *RADIOLOGY REPORT*  Clinical Data: Status post CABG, postoperative exam  PORTABLE CHEST - 1 VIEW  Comparison: 04/25/2012  Findings: Endotracheal and nasogastric tubes appropriately positioned.  Bilateral chest tubes and mediastinal drains are in place.  Right IJ Swan-Ganz catheter tip over main pulmonary artery. Evidence of median sternotomy.  Mild enlargement of the cardiac silhouette with central vascular congestion but no overt edema.  No pneumothorax.  Trace bilateral pleural effusions.  Hypoaeration with bibasilar atelectasis.  IMPRESSION: Postoperative low volume exam with bibasilar atelectasis and support apparatus as above.   Original Report Authenticated By: Harrel Lemon, M.D.    CABG: 04/28/12:  PROCEDURE: Procedure(s):  CORONARY ARTERY BYPASS GRAFTING (CABG)X4 LIMA-LAD; SEQ SVG-PD-PL; SVG-DIAG  EVH BILAT THIGH( RIGHT GSV TOO SMALL)    Medications: I have reviewed the patient's current medications.    Marland Kitchen acetaminophen (TYLENOL) oral liquid 160 mg/5 mL  650 mg Per Tube NOW   Or  . acetaminophen  650 mg Rectal NOW  . acetaminophen  1,000 mg Oral  Q6H   Or  . acetaminophen (TYLENOL) oral liquid 160 mg/5 mL  975 mg Per Tube Q6H  . aspirin EC  325 mg Oral Daily   Or  . aspirin  324 mg Per Tube Daily  . atorvastatin  80 mg Oral q1800  . bisacodyl  10 mg Oral Daily   Or  . bisacodyl  10 mg Rectal Daily  . docusate sodium  200 mg Oral Daily  . famotidine (PEPCID) IV  20 mg Intravenous Q12H  . insulin aspart  0-24 Units Subcutaneous Q2H  . magnesium sulfate  4 g Intravenous Once  . midazolam  2 mg Intravenous Once  . moxifloxacin  400 mg Intravenous Q24H  . nitroglycerin-nicardipine-HEPARIN-sodium bicarbonate irrigation for artery spasm   Irrigation To OR  . pantoprazole  40 mg Oral Q1200  . phenylephrine (NEO-SYNEPHRINE) Adult infusion  30-200 mcg/min Intravenous To OR  . potassium chloride  10 mEq Intravenous Q1 Hr x 3  . sodium chloride  3 mL Intravenous Q12H  . vancomycin  1,000 mg Intravenous Once  . DISCONTD: aspirin EC  325 mg Oral Daily  . DISCONTD: atorvastatin  80 mg Oral q1800  . DISCONTD: DOPamine  2-20 mcg/kg/min Intravenous To OR  . DISCONTD: epinephrine  0.5-20 mcg/min Intravenous To OR  . DISCONTD: insulin aspart  0-24 Units Subcutaneous Q4H  . DISCONTD: insulin aspart  0-24 Units Subcutaneous Q4H  . DISCONTD: insulin regular  0-10 Units Intravenous TID WC  . DISCONTD: magnesium sulfate  40 mEq Other To OR  . DISCONTD: metoprolol tartrate  12.5 mg Per Tube BID  . DISCONTD: metoprolol tartrate  12.5 mg Per Tube BID  . DISCONTD: metoprolol tartrate  12.5 mg Oral BID  . DISCONTD: metoprolol tartrate  12.5 mg Oral BID  . DISCONTD: metoprolol tartrate  12.5 mg Oral BID  . DISCONTD: pantoprazole  40 mg Oral Q breakfast  . DISCONTD: pantoprazole  40 mg Oral Q1200  . DISCONTD: potassium chloride  80 mEq Other To OR  . DISCONTD: sodium chloride  3 mL Intravenous Q12H  . DISCONTD: tranexamic acid  2 mg/kg Intracatheter To OR   Assessment/Plan: Principal Problem:  *ST elevation myocardial infarction (STEMI) of  inferior wall, initial episode of care - s/p PTCA of 95% ISR to 70% and 100% distal stent occlusion to ~40% (8/25) Active Problems:  CAD (coronary artery disease), history of RCA stent  Hyperlipidemia LDL goal < 70 - on Lipitor 10mg  @ home  CAD (coronary artery disease), native coronary artery -- early Mid LAD tandem 70% lesions with intervening ectatsia; extensive RCA disease with recurrent ISR/thrombosis - not PCI amenable. 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  PLAN:  IV neo continues, BP stable but borderline.  No BB added yet.    LOS: 4 days   INGOLD,LAURA R 04/29/2012, 10:38 AM   Patient seen and examined. Agree with assessment and plan. Stable  hemodynamics. ECG with residual T wave inversion inferiorly.    Lennette Bihari, MD, [facility] 04/29/2012 10:58 AM

## 2012-04-30 ENCOUNTER — Inpatient Hospital Stay (HOSPITAL_COMMUNITY): Payer: PRIVATE HEALTH INSURANCE

## 2012-04-30 LAB — BASIC METABOLIC PANEL
CO2: 28 mEq/L (ref 19–32)
Chloride: 100 mEq/L (ref 96–112)
Creatinine, Ser: 0.82 mg/dL (ref 0.50–1.35)
GFR calc Af Amer: >90 mL/min (ref >90)
Potassium: 3.7 mEq/L (ref 3.5–5.1)
Sodium: 132 mEq/L — ABNORMAL LOW (ref 135–145)

## 2012-04-30 LAB — CBC
HCT: 29.8 % — ABNORMAL LOW (ref 39.0–52.0)
Hemoglobin: 10.1 g/dL — ABNORMAL LOW (ref 13.0–17.0)
RBC: 3.29 MIL/uL — ABNORMAL LOW (ref 4.22–5.81)
RDW: 13.3 % (ref 11.5–15.5)
WBC: 7.7 10*3/uL (ref 4.0–10.5)

## 2012-04-30 MED ORDER — SODIUM CHLORIDE 0.9 % IJ SOLN: 3.0000 mL | INTRAMUSCULAR | Status: DC | PRN

## 2012-04-30 MED ORDER — ONDANSETRON HCL 4 MG/2ML IJ SOLN: 4.0000 mg | Freq: Four times a day (QID) | INTRAMUSCULAR | Status: DC | PRN

## 2012-04-30 MED ORDER — METOPROLOL TARTRATE 12.5 MG HALF TABLET
12.5000 mg | ORAL_TABLET | Freq: Two times a day (BID) | ORAL | Status: DC
  Administered 2012-04-30 – 2012-05-01 (×2): 12.5 mg via ORAL
  Filled 2012-04-30 (×5): qty 1

## 2012-04-30 MED ORDER — MOVING RIGHT ALONG BOOK
Freq: Once | Status: AC
  Administered 2012-04-30: 18:00:00
  Filled 2012-04-30: qty 1

## 2012-04-30 MED ORDER — ASPIRIN EC 325 MG PO TBEC
325.0000 mg | DELAYED_RELEASE_TABLET | Freq: Every day | ORAL | Status: DC
  Administered 2012-05-01 – 2012-05-02 (×2): 325 mg via ORAL
  Filled 2012-04-30 (×2): qty 1

## 2012-04-30 MED ORDER — ATORVASTATIN CALCIUM 10 MG PO TABS
10.0000 mg | ORAL_TABLET | Freq: Every day | ORAL | Status: DC
  Administered 2012-04-30 – 2012-05-01 (×2): 10 mg via ORAL
  Filled 2012-04-30 (×3): qty 1

## 2012-04-30 MED ORDER — POTASSIUM CHLORIDE 10 MEQ/50ML IV SOLN
INTRAVENOUS | Status: AC
  Administered 2012-04-30: 10 meq via INTRAVENOUS
  Filled 2012-04-30: qty 50

## 2012-04-30 MED ORDER — POTASSIUM CHLORIDE CRYS ER 20 MEQ PO TBCR
20.0000 meq | EXTENDED_RELEASE_TABLET | Freq: Every day | ORAL | Status: DC
  Administered 2012-04-30 – 2012-05-02 (×3): 20 meq via ORAL
  Filled 2012-04-30 (×3): qty 1

## 2012-04-30 MED ORDER — POTASSIUM CHLORIDE 10 MEQ/50ML IV SOLN
10.0000 meq | INTRAVENOUS | Status: AC
  Administered 2012-04-30 (×3): 10 meq via INTRAVENOUS

## 2012-04-30 MED ORDER — GUAIFENESIN ER 600 MG PO TB12
600.0000 mg | ORAL_TABLET | Freq: Two times a day (BID) | ORAL | Status: DC | PRN
  Filled 2012-04-30: qty 1

## 2012-04-30 MED ORDER — FUROSEMIDE 40 MG PO TABS
40.0000 mg | ORAL_TABLET | Freq: Every day | ORAL | Status: DC
  Administered 2012-05-01 – 2012-05-02 (×2): 40 mg via ORAL
  Filled 2012-04-30 (×3): qty 1

## 2012-04-30 MED ORDER — ACETAMINOPHEN 325 MG PO TABS: 650.0000 mg | ORAL_TABLET | Freq: Four times a day (QID) | ORAL | Status: DC | PRN

## 2012-04-30 MED ORDER — DOCUSATE SODIUM 100 MG PO CAPS
200.0000 mg | ORAL_CAPSULE | Freq: Every day | ORAL | Status: DC
  Administered 2012-04-30 – 2012-05-02 (×3): 200 mg via ORAL
  Filled 2012-04-30 (×3): qty 2

## 2012-04-30 MED ORDER — OXYCODONE HCL 5 MG PO TABS
5.0000 mg | ORAL_TABLET | ORAL | Status: DC | PRN
  Administered 2012-04-30 – 2012-05-02 (×6): 10 mg via ORAL
  Filled 2012-04-30 (×6): qty 2

## 2012-04-30 MED ORDER — ONDANSETRON HCL 4 MG PO TABS: 4.0000 mg | ORAL_TABLET | Freq: Four times a day (QID) | ORAL | Status: DC | PRN

## 2012-04-30 MED ORDER — SODIUM CHLORIDE 0.9 % IJ SOLN
3.0000 mL | Freq: Two times a day (BID) | INTRAMUSCULAR | Status: DC
  Administered 2012-04-30 – 2012-05-01 (×3): 3 mL via INTRAVENOUS

## 2012-04-30 MED ORDER — LORATADINE 10 MG PO TABS
10.0000 mg | ORAL_TABLET | Freq: Every day | ORAL | Status: DC
  Administered 2012-05-01 – 2012-05-02 (×2): 10 mg via ORAL
  Filled 2012-04-30 (×2): qty 1

## 2012-04-30 MED ORDER — TRAMADOL HCL 50 MG PO TABS: 50.0000 mg | ORAL_TABLET | ORAL | Status: DC | PRN

## 2012-04-30 MED ORDER — PANTOPRAZOLE SODIUM 40 MG PO TBEC
40.0000 mg | DELAYED_RELEASE_TABLET | Freq: Every day | ORAL | Status: DC
  Administered 2012-05-01 – 2012-05-02 (×2): 40 mg via ORAL
  Filled 2012-04-30: qty 1

## 2012-04-30 MED ORDER — SODIUM CHLORIDE 0.9 % IV SOLN: 250.0000 mL | INTRAVENOUS | Status: DC | PRN

## 2012-04-30 MED ORDER — ZOLPIDEM TARTRATE 5 MG PO TABS
5.0000 mg | ORAL_TABLET | Freq: Every evening | ORAL | Status: DC | PRN
  Administered 2012-04-30 – 2012-05-01 (×2): 5 mg via ORAL
  Filled 2012-04-30 (×2): qty 1

## 2012-04-30 MED FILL — Electrolyte-R (PH 7.4) Solution: INTRAVENOUS | Qty: 5000 | Status: AC

## 2012-04-30 MED FILL — Sodium Chloride Irrigation Soln 0.9%: Qty: 3000 | Status: AC

## 2012-04-30 MED FILL — Lidocaine HCl IV Inj 20 MG/ML: INTRAVENOUS | Qty: 5 | Status: AC

## 2012-04-30 MED FILL — Heparin Sodium (Porcine) Inj 1000 Unit/ML: INTRAMUSCULAR | Qty: 10 | Status: AC

## 2012-04-30 MED FILL — Heparin Sodium (Porcine) Inj 1000 Unit/ML: INTRAMUSCULAR | Qty: 30 | Status: AC

## 2012-04-30 MED FILL — Sodium Chloride IV Soln 0.9%: INTRAVENOUS | Qty: 1000 | Status: AC

## 2012-04-30 MED FILL — Mannitol IV Soln 20%: INTRAVENOUS | Qty: 500 | Status: AC

## 2012-04-30 MED FILL — Sodium Bicarbonate IV Soln 8.4%: INTRAVENOUS | Qty: 50 | Status: AC

## 2012-04-30 NOTE — Progress Notes (Signed)
[facility]  Subjective: Not sleeping particularly well.  Objective: Vital signs in last 24 hours: Temp:  [97.6 F (36.4 C)-98.9 F (37.2 C)] 98.8 F (37.1 C) (08/30 0708) Pulse Rate:  [59-82] 78  (08/30 0800) Resp:  [14-22] 17  (08/30 0800) BP: (90-135)/(57-78) 115/65 mmHg (08/30 0800) SpO2:  [91 %-100 %] 92 % (08/30 0800) Last BM Date: 04/25/12  Intake/Output from previous day: 08/29 0701 - 08/30 0700 In: 900.2 [I.V.:700.2; IV Piggyback:200] Out: 2095 [Urine:2075; Chest Tube:20] Intake/Output this shift: Total I/O In: 250 [P.O.:150; IV Piggyback:100] Out: 75 [Urine:75]  Medications Current Facility-Administered Medications  Medication Dose Route Frequency Provider Last Rate Last Dose  . 0.45 % sodium chloride infusion   Intravenous Continuous Rowe Clack, PA 20 mL/hr at 04/28/12 1800    . 0.9 %  sodium chloride infusion   Intravenous Continuous Rowe Clack, PA 20 mL/hr at 04/28/12 1700    . 0.9 %  sodium chloride infusion  250 mL Intravenous Continuous Rowe Clack, PA      . acetaminophen (TYLENOL) tablet 1,000 mg  1,000 mg Oral Q6H Rowe Clack, PA   1,000 mg at 04/30/12 4098   Or  . acetaminophen (TYLENOL) solution 975 mg  975 mg Per Tube Q6H Wayne E Gold, PA      . aspirin EC tablet 325 mg  325 mg Oral Daily Rowe Clack, [place]   325 mg at 04/29/12 0944   Or  . aspirin chewable tablet 324 mg  324 mg Per Tube Daily Rowe Clack, PA      . atorvastatin (LIPITOR) tablet 80 mg  80 mg Oral q1800 Wayne E Gold, PA   80 mg at 04/29/12 1800  . bisacodyl (DULCOLAX) EC tablet 10 mg  10 mg Oral Daily Rowe Clack, [place]   10 mg at 04/29/12 0944   Or  . bisacodyl (DULCOLAX) suppository 10 mg  10 mg Rectal Daily Rowe Clack, PA      . docusate sodium (COLACE) capsule 200 mg  200 mg Oral Daily Rowe Clack, PA   200 mg at 04/29/12 0944  . famotidine (PEPCID) IVPB 20 mg  20 mg Intravenous Q12H Wayne E Gold, PA   20 mg at 04/28/12 1504  . HYDROmorphone  (DILAUDID) tablet 2 mg  2 mg Oral Q3H PRN Alleen Borne, MD   2 mg at 04/30/12 0300  . ketorolac (TORADOL) 15 MG/ML injection 15 mg  15 mg Intravenous Q6H PRN Alleen Borne, MD   15 mg at 04/30/12 0558  . lactated ringers infusion   Intravenous Continuous Rowe Clack, PA 100 mL/hr at 04/28/12 1800    . morphine 2 MG/ML injection 2-5 mg  2-5 mg Intravenous Q1H PRN Rowe Clack, PA   2 mg at 04/29/12 0825  . nitroGLYCERIN 0.2 mg/mL in dextrose 5 % infusion  0-100 mcg/min Intravenous Continuous Rowe Clack, PA      . ondansetron (ZOFRAN) injection 4 mg  4 mg Intravenous Q6H PRN Rowe Clack, PA   4 mg at 04/28/12 2356  . pantoprazole (PROTONIX) EC tablet 40 mg  40 mg Oral Q1200 Alleen Borne, MD   40 mg at 04/29/12 1201  . phenylephrine (NEO-SYNEPHRINE) 20,000 mcg in dextrose 5 % 250 mL infusion  0-100 mcg/min Intravenous Continuous Rowe Clack, PA   5 mcg/min at 04/29/12 2300  . potassium chloride 10 mEq in 50 mL *CENTRAL  LINE* IVPB  10 mEq Intravenous Q1 Hr x 3 Alleen Borne, MD   10 mEq at 04/29/12 1940  . potassium chloride 10 mEq in 50 mL *CENTRAL LINE* IVPB  10 mEq Intravenous Q1 Hr x 3 Alleen Borne, MD   10 mEq at 04/30/12 0816  . sodium chloride 0.9 % injection 3 mL  3 mL Intravenous Q12H Rowe Clack, PA   3 mL at 04/29/12 2142  . sodium chloride 0.9 % injection 3 mL  3 mL Intravenous PRN Rowe Clack, PA      . traMADol Janean Sark) tablet 50 mg  50 mg Oral Q6H PRN Alleen Borne, MD   50 mg at 04/29/12 1642    PE: General appearance: alert, cooperative and no distress Lungs: Bilateral rales Heart: regular rate and rhythm, S1, S2 normal, no murmur, click, rub or gallop Extremities: Trace LEE Pulses: 2+ and symmetric Skin: warm and dry Neurologic: Grossly normal  Lab Results:   Basename 04/30/12 0405 04/29/12 1655 04/29/12 1650 04/29/12 0415  WBC 7.7 -- 9.6 11.2*  HGB 10.1* 11.2* 11.1* --  HCT 29.8* 33.0* 33.0* --  PLT 143* -- 171 191   BMET  Basename 04/30/12 0405  04/29/12 1655 04/29/12 1650 04/29/12 0415 04/28/12 0630  NA 132* 136 -- 133* --  K 3.7 3.5 -- 4.2 --  CL 100 100 -- 101 --  CO2 28 -- -- 23 25  GLUCOSE 109* 112* -- 131* --  BUN 8 7 -- 10 --  CREATININE 0.82 0.80 0.77 -- --  CALCIUM 8.4 -- -- 8.6 9.4   PT/INR  Basename 04/28/12 1500  LABPROT 17.4*  INR 1.40   PORTABLE CHEST - 1 VIEW  Comparison: 04/29/2012  Findings: A Swan-Ganz catheter, bilateral chest tubes, and  mediastinal drains have been removed. No pneumothorax identified.  Low lung volumes are again seen. Mild left basilar atelectasis  shows no significant change. No evidence of congestive heart  failure. Heart size is stable. Right jugular Cordis remains in  place.  IMPRESSION:  1. No evidence of pneumothorax following chest tube removal.  2. Mild left basilar atelectasis, without significant change  Assessment/Plan  Principal Problem:  *ST elevation myocardial infarction (STEMI) of inferior wall, initial episode of care - s/p PTCA of 95% ISR to 70% and 100% distal stent occlusion to ~40% (8/25) Active Problems:  CAD (coronary artery disease), history of RCA stent  Hyperlipidemia LDL goal < 70 - on Lipitor 10mg  @ home  CAD (coronary artery disease), native coronary artery -- early Mid LAD tandem 70% lesions with intervening ectatsia; extensive RCA disease with recurrent ISR/thrombosis - not PCI amenable. 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  Plan:  Synephrine weaned off.  Recommend starting low lopressor.  BP seems stable with HR in the 80's.     LOS: 5 days    HAGER, BRYAN 04/30/2012 9:06 AM  I have seen and examined the patient along with Wilburt Finlay, PA.  I have reviewed the chart, notes and new data.  I agree with PA's note.  Key new complaints: no angina, has well controlled sternotomy pain Key examination changes: atelectasis bilateral lower lobes, mild leg edema, roughly 8 lb positive, no arrhythmia  PLAN: Will  monitor for postop arrhythmia, fluid overload, but anticipate good progress.  Thurmon Fair, MD, [facility] [phone] 04/30/2012, 4:01 PM

## 2012-04-30 NOTE — Op Note (Signed)
NAME:  Jonathan Escobar, Jonathan Escobar                 ACCOUNT NO.:  [id number]  MEDICAL RECORD NO.:  [id number]  LOCATION:  [facility]                         FACILITY:  [facility]  PHYSICIAN:  Evelene Croon, M.D.     DATE OF BIRTH:  11-20-1945  DATE OF PROCEDURE:  04/28/2012 DATE OF DISCHARGE:                              OPERATIVE REPORT   PREOPERATIVE DIAGNOSIS:  Severe multivessel coronary artery disease, status post inferior ST-segment elevation myocardial infarction.  POSTOPERATIVE DIAGNOSIS:  Severe multivessel coronary artery disease, status post inferior ST-segment elevation myocardial infarction.  OPERATIVE PROCEDURE:  Median sternotomy, extracorporeal circulation, coronary artery bypass graft surgery x4 using a left internal mammary artery graft to the left anterior descending coronary artery, with a saphenous vein graft to the diagonal branch of the LAD, and a sequential saphenous vein graft to the posterior descending and posterolateral branches of the right coronary artery.  Endoscopic vein harvesting from both legs.  ATTENDING SURGEON:  Evelene Croon, MD  ASSISTANT:  Rowe Clack, PA-C  ANESTHESIA:  General endotracheal.  CLINICAL HISTORY:  This patient is a 66 year old gentleman with history of coronary disease, status post stenting of his right coronary artery in 1999 with a bare-metal stent followed by brachytherapy and ultimately a drug-eluting stent in 2005.  He had been on Plavix, but that was stopped due to GI bleeding in 2007.  His previous work has been done down in [place] where he lived.  He was now admitted with an acute inferior ST-segment elevation MI with new onset chest pain.  Cardiac catheterization showed occlusion of the proximal right coronary artery within the previously stented area.  This was opened successfully with PCI.  This was a large dominant vessel.  The posterior descending was a moderate-sized vessel with minimal luminal irregularities.  The posterolateral  branch was a large caliber vessel that had about 70% proximal stenosis.  The LAD had about 40% stenosis just prior to takeoff of the major first diagonal branch.  After this, there was about 70% stenosis followed by another 70% focal stenosis.  Left ventricular ejection fraction was 50% with mild inferior and anterolateral hypokinesis.  After review of the catheterization and examination of the patient, it was felt that coronary artery bypass graft surgery is the best treatment to prevent further ischemia and infarction.  I discussed the operative procedure with the patient including alternatives, benefits, and risks including, but not limited to bleeding, blood transfusion, infection, stroke, myocardial infarction, graft failure, and death.  He understood all of this and agreed to proceed.  OPERATIVE PROCEDURE:  The patient was taken to the operating room and placed on the table in supine position.  After induction of general endotracheal anesthesia, a Foley catheter was placed in the bladder using sterile technique.  Then, the chest, abdomen, and both lower extremities were prepped and draped in usual sterile manner.  The chest was entered through a median sternotomy incision and the pericardium opened in the midline.  Examination of the heart showed good ventricular contractility.  The ascending aorta was of normal size and had no palpable plaques in it.  Then, the left internal mammary artery was harvested from the chest wall as  a pedicle graft.  This was a medium-caliber vessel with excellent blood flow through it.  At the same time, a segment of greater saphenous vein was harvested from the right leg using endoscopic vein harvest technique.  This vein was of small caliber and somewhat sclerotic and not felt to be a suitable conduit.  Therefore, it was discarded and another section of saphenous vein was harvested from the left leg using endoscopic vein harvest technique.  This vein  was from the left leg, was medium caliber vein with good quality.  Then, the patient was heparinized and when an adequate ACT was obtained, the distal ascending aorta was cannulated using a 20-French aortic cannula for arterial inflow.  Venous outflow was achieved using a two- stage venous cannula for the right atrial appendage.  An antegrade cardioplegia and vent cannula was inserted in the aortic root.  The patient was placed on cardiopulmonary bypass and distal coronary arteries were identified.  The LAD had segmental midvessel disease, but beyond that appeared free of disease and was a moderate-sized vessel. The diagonal branch was a large graftable vessel that was diseased proximally.  The right coronary artery was diffusely diseased with calcific plaque.  This extended down to the takeoff of the posterior descending branch.  The proximal portion of the posterior descending branch appeared free of disease as did the remainder of the vessel.  The large second posterolateral branch had no distal disease in it.  Then, the aorta was crossclamped and 1000 mL of cold blood antegrade cardioplegia was administered in the aortic root with quick arrest of the heart.  Systemic hypothermia to 20 degrees centigrade and topical hypothermia with iced saline was used.  A temperature probe was placed in the septum and insulating pad in the pericardium.  The first distal anastomosis was performed to the posterior descending coronary artery.  The internal diameter proximally was about 1.6 mm. The conduit used was a segment of greater saphenous vein and the anastomosis was performed in a sequential side-to-side manner using continuous 7-0 suture.  Flow was noted through the graft and was excellent.  The second distal anastomosis was performed to the posterolateral branch.  The internal ring was 1.75 mm.  The conduit used was a same segment of greater saphenous vein and the anastomosis was  performed in a sequential end-to-side manner using continuous 7-0 Prolene suture.  Flow was noted through the graft and was excellent.  Then, another dose of cardioplegia was given.  The third distal anastomosis was performed to the diagonal branch.  The internal diameter was 1.6 mm.  The conduit used was a second segment of greater saphenous vein and the anastomosis was performed in an end-to-side manner using continuous 7-0 Prolene suture.  Flow was noted through the graft and was excellent.  The fourth distal anastomosis was performed to the mid-LAD.  The internal diameter was 1.75 mm.  The conduit used was a left internal mammary graft, was brought through an opening of the left pericardium anterior to the phrenic nerve.  This was anastomosed to LAD in an end-to- side manner using continuous 8-0 Prolene suture.  The pedicle was sutured to the epicardium with 6-0 Prolene sutures.  Then, another dose of cardioplegia was given.  With crossclamp in place, two proximal vein graft anastomoses were performed to the mid ascending aorta in an end-to- side manner using continuous 6-0 Prolene suture.  Then, the clamp was removed from the mammary pedicle.  There was rapid warming of the  ventricular septum and return of spontaneous ventricular fibrillation. The crossclamp was removed with time of 79 minutes and the patient was spontaneously converted to sinus bradycardia rhythm.  The proximal and distal anastomoses appeared hemostatic and allowed the grafts satisfactory.  Graft marker was placed around the proximal anastomoses. Two temporary right ventricular and right atrial pacing wires were placed and brought out through the skin.  When the patient was rewarmed to 37 degrees centigrade, he was weaned from cardiopulmonary bypass on no inotropic agents.  Total bypass time was 97 minutes.  Cardiac function appeared excellent with a cardiac output of 5 liters/minute.  Protamine was given and  the venous and aortic cannulas were removed without difficulty.  Hemostasis was achieved.  Three chest tubes were placed with tube in the posterior pericardium, one in the left pleural space and one in the anterior mediastinum.  The sternum was closed with double #6 stainless steel wires.  The fascia was closed with continuous #1 Vicryl suture. Subcutaneous tissue was closed with continuous 2-0 Vicryl and the skin with a 3-0 Vicryl subcuticular closure.  The lower extremity vein harvest site was closed in layers in a similar manner.  The sponge, needle, and instrument counts were correct according to the scrub nurse. Dry sterile dressing was applied over the incisions around the chest tubes, which were hooked to Pleur-Evac suction.  The patient remained hemodynamically stable and was transported to the SICU in guarded, but stable condition.     Evelene Croon, M.D.     BB/MEDQ  D:  04/28/2012  T:  04/29/2012  Job:  [id number]  cc:   Landry Corporal, MD

## 2012-04-30 NOTE — Progress Notes (Addendum)
2 Days Post-Op Procedure(s) (LRB): CORONARY ARTERY BYPASS GRAFTING (CABG) (N/A)  Subjective: Patient not sleeping-requesting medicine.  Objective: Vital signs in last 24 hours: Patient Vitals for the past 24 hrs:  BP Temp Temp src Pulse Resp SpO2  04/30/12 0708 - 98.8 F (37.1 C) Oral - - -  04/30/12 0600 111/64 mmHg - - 82  18  96 %  04/30/12 0530 91/57 mmHg - - 77  20  96 %  04/30/12 0500 97/60 mmHg - - 75  20  94 %  04/30/12 0430 105/63 mmHg - - 75  19  93 %  04/30/12 0400 95/59 mmHg 98.9 F (37.2 C) Oral 75  16  92 %  04/30/12 0330 90/57 mmHg - - 75  19  93 %  04/30/12 0300 101/67 mmHg - - 80  19  93 %  04/30/12 0230 100/60 mmHg - - 82  21  94 %  04/30/12 0200 103/61 mmHg - - 79  18  92 %  04/30/12 0130 97/59 mmHg - - 76  19  92 %  04/30/12 0100 96/60 mmHg - - 80  17  93 %  04/30/12 0030 91/62 mmHg - - 78  17  92 %  04/30/12 0000 107/65 mmHg 98.2 F (36.8 C) Oral 75  20  95 %  04/29/12 2330 112/69 mmHg - - 74  21  95 %  04/29/12 2300 114/69 mmHg - - 75  16  93 %  04/29/12 2230 99/60 mmHg - - 72  19  98 %  04/29/12 2200 101/60 mmHg - - 71  18  96 %  04/29/12 2130 111/64 mmHg - - 73  21  96 %  04/29/12 2100 104/65 mmHg - - 74  18  97 %  04/29/12 2030 103/65 mmHg - - 70  17  96 %  04/29/12 2000 - - - 73  17  96 %  04/29/12 1934 - 98.1 F (36.7 C) Oral - - -  04/29/12 1900 101/66 mmHg - - 73  18  91 %  04/29/12 1800 106/67 mmHg - - 69  20  96 %  04/29/12 1730 111/65 mmHg - - 66  21  96 %  04/29/12 1700 105/61 mmHg - - 67  19  96 %  04/29/12 1600 102/58 mmHg - - 66  16  98 %  04/29/12 1544 - 98 F (36.7 C) Oral - - -  04/29/12 1500 101/58 mmHg - - 67  16  97 %  04/29/12 1400 106/65 mmHg - - 63  18  97 %  04/29/12 1300 105/60 mmHg - - 59  14  100 %  04/29/12 1232 - 97.6 F (36.4 C) Oral - - -  04/29/12 1200 104/63 mmHg - - 65  20  99 %  04/29/12 1100 111/68 mmHg - - 65  16  99 %  04/29/12 1000 99/60 mmHg - - 66  16  98 %  04/29/12 0900 96/59 mmHg - - 69  17  97 %    04/29/12 0800 100/66 mmHg 98.6 F (37 C) - 75  18  97 %   Pre op weight 94.5 kg Current Weight  04/29/12 216 lb 14.9 oz (98.4 kg)    Hemodynamic parameters for last 24 hours: PAP: (32)/(18) 32/18 mmHg  Intake/Output from previous day: 08/29 0701 - 08/30 0700 In: 900.2 [I.V.:700.2; IV Piggyback:200] Out: 2095 [Urine:2075; Chest Tube:20]   Physical Exam:  Cardiovascular: RRR Pulmonary: Diminished at  bases; no rales, wheezes, or rhonchi. Abdomen: Soft, non tender, bowel sounds present. Extremities: Mild bilateral lower extremity edema. Wounds: Dressings are clean and dry.   Lab Results: CBC:  Basename 04/30/12 0405 04/29/12 1655 04/29/12 1650  WBC 7.7 -- 9.6  HGB 10.1* 11.2* --  HCT 29.8* 33.0* --  PLT 143* -- 171   BMET:   Basename 04/30/12 0405 04/29/12 1655 04/29/12 0415  NA 132* 136 --  K 3.7 3.5 --  CL 100 100 --  CO2 28 -- 23  GLUCOSE 109* 112* --  BUN 8 7 --  CREATININE 0.82 0.80 --  CALCIUM 8.4 -- 8.6    PT/INR:  Lab Results  Component Value Date   INR 1.40 04/28/2012   ABG:  INR: Will add last result for INR, ABG once components are confirmed Will add last 4 CBG results once components are confirmed  Assessment/Plan:  1. CV - SR. Weaned off Neo synephrine.On Lopressor 12.5 bid (with parameters) 2.  Pulmonary - CXR this am shows bibasilar atelectasis L>R, no ptx. Encourage incentive spirometer. 3. Volume Overload - Diurese. 4.  Acute blood loss anemia - H and H stable at 10.1 and 29.8. 5.Mild hyponatremia 6.Pre op HGA1C 5.9.CBGs have been less than 130 .Will continue diabetic diet and will need follow up as an outpatient. 7.Supplement potassium. 8.Thrombocytopenia-platelets 143,000. 9.Transfer to 2000. 10. Medicine for sleep.  ZIMMERMAN,DONIELLE MPA-C 04/30/2012  Chart reviewed, patient examined, agree with above.

## 2012-05-01 ENCOUNTER — Inpatient Hospital Stay (HOSPITAL_COMMUNITY): Payer: PRIVATE HEALTH INSURANCE

## 2012-05-01 LAB — CBC
HCT: 32.1 % — ABNORMAL LOW (ref 39.0–52.0)
Hemoglobin: 10.9 g/dL — ABNORMAL LOW (ref 13.0–17.0)
MCH: 30.4 pg (ref 26.0–34.0)
MCV: 89.4 fL (ref 78.0–100.0)
RBC: 3.59 MIL/uL — ABNORMAL LOW (ref 4.22–5.81)
WBC: 8.5 10*3/uL (ref 4.0–10.5)

## 2012-05-01 LAB — BASIC METABOLIC PANEL
CO2: 25 mEq/L (ref 19–32)
Calcium: 8.9 mg/dL (ref 8.4–10.5)
Chloride: 100 mEq/L (ref 96–112)
Creatinine, Ser: 0.88 mg/dL (ref 0.50–1.35)
Glucose, Bld: 102 mg/dL — ABNORMAL HIGH (ref 70–99)

## 2012-05-01 MED ORDER — ASPIRIN 325 MG PO TBEC: 325.0000 mg | DELAYED_RELEASE_TABLET | Freq: Every day | ORAL | Status: AC

## 2012-05-01 MED ORDER — TRAMADOL HCL 50 MG PO TABS: 50.0000 mg | ORAL_TABLET | ORAL | Status: AC | PRN

## 2012-05-01 MED ORDER — MOVING RIGHT ALONG BOOK
Freq: Once | Status: AC
  Administered 2012-05-01: 17:00:00
  Filled 2012-05-01: qty 1

## 2012-05-01 MED ORDER — OXYCODONE HCL 5 MG PO TABS: 5.0000 mg | ORAL_TABLET | ORAL | Status: AC | PRN

## 2012-05-01 MED ORDER — GUAIFENESIN ER 600 MG PO TB12: 600.0000 mg | ORAL_TABLET | Freq: Two times a day (BID) | ORAL | Status: DC | PRN

## 2012-05-01 MED ORDER — METOPROLOL TARTRATE 25 MG PO TABS: 12.5000 mg | ORAL_TABLET | Freq: Two times a day (BID) | ORAL | Status: DC

## 2012-05-01 NOTE — Plan of Care (Signed)
Problem: Phase III Progression Outcomes Goal: Anginal pain absent Outcome: Completed/Met Date Met:  05/01/12 Pain well controled with meds. Pain at patients tolerable level. Will continue to monitor and intervene if necessary.    Goal: Up to chair & ambulate with assist (TID) Outcome: Completed/Met Date Met:  05/01/12 Ambulates within room with SBA1. Goal: Tolerating diet Outcome: Completed/Met Date Met:  05/01/12 Denies Nausea/Vomiting. Pt on carb modified diet.

## 2012-05-01 NOTE — Progress Notes (Signed)
Pt ambulated 500 ft with rolling walker and wife and tolerated activity well. Pt tolerated activity well. Will continue to monitor.

## 2012-05-01 NOTE — Discharge Summary (Signed)
Physician Discharge Summary  Patient ID: Jonathan Escobar MRN: [id number] DOB/AGE: Jun 26, 1946 66 y.o.  Admit date: 04/25/2012 Discharge date: 05/02/2012  Admission Diagnoses:  Patient Active Problem List  Diagnosis  . ST elevation myocardial infarction (STEMI) of inferior wall, initial episode of care - s/p PTCA of 95% ISR to 70% and 100% distal stent occlusion to ~40% (8/25)  . CAD (coronary artery disease), history of RCA stent  . H/O: GI bleed  . Hyperlipidemia LDL goal < 70 - on Lipitor 10mg  @ home  . CAD (coronary artery disease), native coronary artery -- early Mid LAD tandem 70% lesions with intervening ectasia; extensive RCA disease with recurrent ISR/thrombosis - not PCI amenable. Plan CABG   Discharge Diagnoses:   Patient Active Problem List  Diagnosis  . ST elevation myocardial infarction (STEMI) of inferior wall, initial episode of care - s/p PTCA of 95% ISR to 70% and 100% distal stent occlusion to ~40% (8/25)  . CAD (coronary artery disease), history of RCA stent  . H/O: GI bleed  . Hyperlipidemia LDL goal < 70 - on Lipitor 10mg  @ home  . CAD (coronary artery disease), native coronary artery -- early Mid LAD tandem 70% lesions with intervening ectatsia; extensive RCA disease with recurrent ISR/thrombosis - not PCI amenable. Plan CABG  . 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   Discharged Condition: good  History of Present Illness:   Jonathan Escobar is a 66 yo white male with known history of CAD.  He is S/P PCI with stent placement to RCA followed by brachytherapy and ultimately a DES stent.  Patient was eventually taken off Plavix after suffering a GI bleed.  The patient presented to [facility] Emergency Department on 04/25/2012 with a new onset of chest pain.   The patient stated it began around lunch time.  He took ASA and expired NTG which did not relieve the pain.  Therefore he called EMS.  EKG was obtained and revealed ST elevation in the inferior leads.  He  was given Fentanyl for pain control.  The hospital was notified the patient was most likely suffering a STEMI and he was taken emergently to the cardiac catheterization lab upon arrival.    Hospital Course:  HD #0 patient was taken straight to catheterization lab upon arrival.  He was found to have severe 2 vessel CAD, involving the LAD and previously placed stent to the RCA which was totally occluded and felt to the be the source for the patient's MI.  The patient underwent 2 site angioplasty which was proceeded with aspiration thrombectomy.  Due to the complex nature of the PTCA it was felt the patient would benefit from coronary bypass for further treatment of his CAD.  He was placed on Heparin and his Integrelin was continued.  TCTS was consulted and Dr. Tyrone Escobar evaluated the patient.  The patient was chest pain free during evaluation.  Dr. Tyrone Escobar agreed with Cardiology's assessment that patient would benefit from coronary bypass during current hospital admission.  He was going to discuss the patient with one of his partners and surgery would be scheduled at next available slot.  HD #1 patient remained chest pain free.  His troponin level was trending down.  Dr. Laneta Escobar evaluated the patient for coronary bypass.  He explained the risks and benefits of the procedure to the patient and he was agreeable to proceed. HD #2 patient with new onset diarrhea, treated with imodium.  HD #3 the patient was taken to  the operating room.  He underwent CABG x 4 utilizing LIMA to LAD, Sequential SVG to PD and PL, and SVG to Diagonal.  He also underwent endoscopic saphenous vein harvest of his right and left thigh.  He tolerated the procedure well and was taken to the SICU in stable condition.  POD #0 the patient was extubated.  POD #1 patient was weaned off Neo Synephrine as tolerated.  His SWAN ganz catheter was removed. His chest tubes were removed without difficulty.  POD #2 patient was started on Lopressor. His chest  xray did not show evidence of pneumothorax.  He was medically stable and was transferred to the telemetry unit in stable condition.  POD #3 patient is doing very well. He is maintaining NSR.  We will discontinue his external pacing wires.  Should he continue to progress and no acute arrythmia develops post wire removal we will plan to discharge patient home in the next 24 hours.  He will follow up with Dr. Laneta Escobar in 3 weeks time with a chest xray.  Our office will contact him with appointment date and time.  He will also need to arrange follow up appointment with Dr. Elissa Escobar office  Significant Diagnostic Studies: cardiac catheterization  Hemodynamics:  Central Aortic / Mean Pressures: 100/60 mmHg  Left Ventricular Pressures / EDP: 99/6 mmHg; 20 mmHg Left Ventriculography:  EF: Roughly 50 %  Wall Motion: Mild mid inferior and anterolateral hypokinesis Coronary Anatomy:  Left Main: Large caliber vessel bifurcates into the LAD and Circumflex major branches along with a small Ramus Intermedius. LAD: The vessel begins a large caliber vessel that tapers to about 40% just prior to the takeoff of the major first diagonal branch (D1), after this branch there is a focal 70% lesion followed by a short poststenotic ectatic segment and then a second focal 70% lesion. The remainder the vessel has minimal luminal irregularities with TIMI-3 flow.  D1: Moderate caliber vessel with no significant disease; it is not involved with the  Due to the abnormal nature the LAD lesion, additional angiographic images were performed, leading to a less than 1 minute delay to Right Coronary Artery angiography. Left Circumflex: Moderate caliber vessel, gives off 2 small marginal branches before having a tubular 50% lesion in the mid vessel prior to bifurcating into a lateral OM branch and the AV groove circumflex that gives off 2 more branches, one of them the distal OM and the other bifurcates into 2 small posterior lateral  branches.  OM1: Small moderate caliber vessel covering a relatively large distribution, angiographically normal  OM 2: Small vessel, minimal luminal irregularities Ramus intermedius: Small to moderate caliber vessel that covers are sure portion of the anterolateral wall, tapers into a small vessel that is relatively insignificant at the proximal segment the  RCA: Large caliber dominant vessel with a proximal stent that to its entirety has 20+ % in-stent restenosis with a focal point in the mid stent right at the take off of the marginal branch where there is a 95% stenosis, the vessel is then 100% occluded at the distal edge of the stent with Promus in the intervening segment between the mid stent lesion and the occlusion site. Post Thrombectomy and Angioplasty:  RPDA: Small moderate caliber vessel with minimal luminal irregularities  RPL System:The Right Posterior AV Groove Branch begins as a moderate caliber vessel that branches shortly into 2 posterior lateral branches, there is a tubular 70% lesion in RPL 2. After the initial cineangiographic view of the occluded  RCA, this is determined to be the culprit vessel. The guide catheter was already in place, and the Angiomax bolus been ordered and administered an ACT of greater than 200 sec at this point.  Percutaneous Coronary Intervention: With the plan to proceed with PTCA only followed by CABG as the best option, a thienopyridine/antiplatelet agent was not administered. Integrelin double bolus was administered followed by a drip at a standard rate.  Guide: 6 Fr JR 4 Guidewire: BMW  Pronto V4 Aspiration Thrombectomy Catheter: 1 pass made to the initial in-stent restenosis lesion but unable pass beyond that. Despite this, the patient had notable sinus bradycardia into the 30 bpm range requiring administration of 1 mg Atropine. This was consistent with reperfusion. Post thrombectomy angiography after Intracoronary Nitroglycerin injection demonstrated  restoration of TIMI-3 flow throughout the RCA and downstream. The patient at this point was pain-free, and the ECG changes were back to baseline.  Predilation Balloon: Sprinter Legend 2.5 mm x 15 mm;  8 Atm x 35 Sec -- at the distal edge of the stent where the original occlusion site was  10 Atm x 35 Sec -- in the mid stent 95% ISR site. At this point and Angio-Sculpt balloon was used but was unable to pass beyond the proximal portion of the stent. Therefore, a noncompliant balloon was chosen to treat the in-stent restenosis.  Balloon #2: [place] trek 3.0 mm x 8 mm; multiple inflations were made throughout the entire stented segment  12 Atm x 30 Sec x 2 separate inflations at either end of the stent,  14 Atm x 33 Sec - just distal to the ISR site  18 Atm x 45 Sec x 2 -- in overlapping fashion, covering the most significant consent restenosis site.  Balloon #3: [place]arro Quantum Apex 3.5 mm x 12 mm; at the most significant mid stent ISR site  14 Atm x 60 Sec -- with reduction of the 95% stenosis to roughly 60-70% stenosis  At this point there was concern for possible dissection at the distal PTCA site beyond the stent. The decision was made to use long inflations of a compliant balloon with the attempt to potentially "tack "the dissection down.  Balloon #4: Trek 3.0 mm x 20 mm  12 Atm x 90 Sec x 2; reduction of the 100% original stenosis to roughly 40%  Post-angioplasty cineangiography with and without guidewire in place was performed in multiple orthogonal views demonstrating no evidence of dissection or perforation with excellent TIMI-3 flow distally and residual lesions as noted above. It was determined that was positive potential dissection was actually a branch vessel that was now becoming apparent right parallel to the parent vessel.   Treatments: surgery:   Median sternotomy, extracorporeal circulation,  coronary artery bypass graft surgery x4 using a left internal mammary  artery graft to the left  anterior descending coronary artery, with a  saphenous vein graft to the diagonal branch of the LAD, and a sequential saphenous vein graft to the posterior descending and posterolateral branches of the right coronary artery. Endoscopic vein harvesting from both legs.   Disposition:Home  Discharge Orders    Future Orders Please Complete By Expires   Amb Referral to Cardiac Rehabilitation        Medication List  As of 05/02/2012  7:53 AM   STOP taking these medications         aspirin EC 81 MG tablet      CARDIZEM CD 120 MG 24 hr capsule  TAKE these medications         aspirin 325 MG EC tablet   Take 1 tablet (325 mg total) by mouth daily.      atorvastatin 10 MG tablet   Commonly known as: LIPITOR   Take 10 mg by mouth at bedtime.      cetirizine 10 MG tablet   Commonly known as: ZYRTEC   Take 10 mg by mouth daily.      furosemide 20 MG tablet   Commonly known as: LASIX   Take 1 tablet (20 mg total) by mouth 2 (two) times daily. For 5 days      guaiFENesin 600 MG 12 hr tablet   Commonly known as: MUCINEX   Take 1 tablet (600 mg total) by mouth every 12 (twelve) hours as needed for congestion.      METAMUCIL PO   Take by mouth daily. Takes 1 teaspoon of Metamucil daily      metoprolol tartrate 25 MG tablet   Commonly known as: LOPRESSOR   Take 0.5 tablets (12.5 mg total) by mouth 2 (two) times daily.      multivitamin with minerals Tabs   Take 1 tablet by mouth daily.      omeprazole 20 MG capsule   Commonly known as: PRILOSEC   Take 20 mg by mouth at bedtime.      OVER THE COUNTER MEDICATION   Take 1 capsule by mouth daily. Medication:MegaRed Omega-3 Krill Oil      oxyCODONE 5 MG immediate release tablet   Commonly known as: Oxy IR/ROXICODONE   Take 1-2 tablets (5-10 mg total) by mouth every 4 (four) hours as needed.      potassium chloride 10 MEQ tablet   Commonly known as: K-DUR   Take 1 tablet (10 mEq total) by mouth 2 (two) times daily. For 5  days      traMADol 50 MG tablet   Commonly known as: ULTRAM   Take 1-2 tablets (50-100 mg total) by mouth every 4 (four) hours as needed.      vitamin C 1000 MG tablet   Take 1,000 mg by mouth daily.           Follow-up Information    Follow up with Alleen Borne, MD in 3 weeks. (Office will contact you with appointment date and time)    Contact information:   [address] [place] [address] [phone]       Follow up with [facility] in 3 weeks. (Please get chest xray 1 hour prior to your appointment with Dr Jonathan Escobar)    Contact information:   [address]      Follow up with Marykay Lex, MD. Schedule an appointment as soon as possible for a visit in 2 weeks. (Please contact office to set up appointment for 2 weeks from hospital discharge)    Contact information:   [facility] [address], [address] [place] [address] [phone]          Signed: Lowella Dandy 05/02/2012, 7:53 AM

## 2012-05-01 NOTE — Progress Notes (Addendum)
3 Days Post-Op Procedure(s) (LRB): CORONARY ARTERY BYPASS GRAFTING (CABG) (N/A)  Subjective: Mr. Ketelsen feels much better this morning.  He has no complaints.  No bowel movement yet, but + flatus  Objective: Vital signs in last 24 hours: Temp:  [98.4 F (36.9 C)-99.4 F (37.4 C)] 99.4 F (37.4 C) (08/31 0526) Pulse Rate:  [82-99] 87  (08/31 0526) Cardiac Rhythm:  [-] Sinus tachycardia (08/30 1935) Resp:  [16-23] 16  (08/31 0526) BP: (113-131)/(67-77) 130/77 mmHg (08/31 0526) SpO2:  [90 %-95 %] 93 % (08/31 0526) Weight:  [213 lb 1.6 oz (96.662 kg)] 213 lb 1.6 oz (96.662 kg) (08/31 0526)    Intake/Output from previous day: 08/30 0701 - 08/31 0700 In: 850 [P.O.:750; IV Piggyback:100] Out: 875 [Urine:875]  General appearance: alert, cooperative and no distress Heart: regular rate and rhythm Lungs: clear to auscultation bilaterally Abdomen: soft, non-tender; bowel sounds normal; no masses,  no organomegaly Extremities: edema trace Wound: clean and dry  Lab Results:  [facility] 05/01/12 0604 04/30/12 0405  WBC 8.5 7.7  HGB 10.9* 10.1*  HCT 32.1* 29.8*  PLT 174 143*   BMET:  Basename 05/01/12 0604 04/30/12 0405  NA 133* 132*  K 4.1 3.7  CL 100 100  CO2 25 28  GLUCOSE 102* 109*  BUN 7 8  CREATININE 0.88 0.82  CALCIUM 8.9 8.4    PT/INR:  Basename 04/28/12 1500  LABPROT 17.4*  INR 1.40   ABG    Component Value Date/Time   PHART 7.364 04/28/2012 1926   HCO3 21.1 04/28/2012 1926   TCO2 24 04/29/2012 1655   ACIDBASEDEF 4.0* 04/28/2012 1926   O2SAT 94.0 04/28/2012 1926   CBG (last 3)   Basename 04/29/12 0715 04/29/12 0409 04/29/12 0017  GLUCAP 113* 121* 118*    Assessment/Plan: S/P Procedure(s) (LRB): CORONARY ARTERY BYPASS GRAFTING (CABG) (N/A)  1. CV- NSR rate and pressure well controlled, continue Lopressor 2. Pulm- no acute issues, off oxygen, continue IS for atelectasis 3. Acute post operative anemia- stable 4. Volume status- patient's weight is below  admission, will discontinue Lasix 5. Hyponatremia 6. Dispo- patient doing very well, will d/c EPW today, if no issues arise and patient has BM, could potentially be discharged in next 24-48 hours   LOS: 6 days    Lowella Dandy 05/01/2012    Chart reviewed, patient examined, agree with above. Preop wt 94.5 kg.  Today 96.6 kg. Probably needs a little further diuresis.   Home tomorrow if no changes.

## 2012-05-01 NOTE — Progress Notes (Signed)
[facility]  Subjective: No Complaints.  Slept well.  Objective: Vital signs in last 24 hours: Temp:  [98.4 F (36.9 C)-99.4 F (37.4 C)] 99.4 F (37.4 C) (08/31 0526) Pulse Rate:  [82-99] 87  (08/31 0526) Resp:  [16-23] 16  (08/31 0526) BP: (114-131)/(67-77) 130/77 mmHg (08/31 0526) SpO2:  [90 %-95 %] 93 % (08/31 0526) Weight:  [96.662 kg (213 lb 1.6 oz)] 96.662 kg (213 lb 1.6 oz) (08/31 0526) Last BM Date: 04/27/12  Intake/Output from previous day: 08/30 0701 - 08/31 0700 In: 850 [P.O.:750; IV Piggyback:100] Out: 875 [Urine:875] Intake/Output this shift:    Medications Current Facility-Administered Medications  Medication Dose Route Frequency Provider Last Rate Last Dose  . 0.9 %  sodium chloride infusion  250 mL Intravenous PRN Ardelle Balls, PA      . acetaminophen (TYLENOL) tablet 650 mg  650 mg Oral Q6H PRN Ardelle Balls, PA      . aspirin EC tablet 325 mg  325 mg Oral Daily Donielle Margaretann Loveless, PA      . atorvastatin (LIPITOR) tablet 10 mg  10 mg Oral QHS Ardelle Balls, PA   10 mg at 04/30/12 2149  . docusate sodium (COLACE) capsule 200 mg  200 mg Oral Daily Ardelle Balls, PA   200 mg at 04/30/12 1825  . furosemide (LASIX) tablet 40 mg  40 mg Oral Daily Donielle Margaretann Loveless, PA      . guaiFENesin (MUCINEX) 12 hr tablet 600 mg  600 mg Oral Q12H PRN Ardelle Balls, PA      . loratadine (CLARITIN) tablet 10 mg  10 mg Oral Daily Donielle Margaretann Loveless, PA      . metoprolol tartrate (LOPRESSOR) tablet 12.5 mg  12.5 mg Oral BID Ardelle Balls, PA   12.5 mg at 04/30/12 2149  . moving right along book   Does not apply Once Ardelle Balls, PA      . ondansetron [facility]) tablet 4 mg  4 mg Oral Q6H PRN Ardelle Balls, PA       Or  . ondansetron [facility] injection 4 mg  4 mg Intravenous Q6H PRN Ardelle Balls, PA      . oxyCODONE (Oxy IR/ROXICODONE) immediate release tablet 5-10 mg  5-10 mg  Oral Q3H PRN Ardelle Balls, PA   10 mg at 05/01/12 1610  . pantoprazole (PROTONIX) EC tablet 40 mg  40 mg Oral QAC breakfast Ardelle Balls, PA   40 mg at 05/01/12 0630  . potassium chloride 10 mEq in 50 mL *CENTRAL LINE* IVPB  10 mEq Intravenous Q1 Hr x 3 Alleen Borne, MD   10 mEq at 04/30/12 0816  . potassium chloride SA (K-DUR,KLOR-CON) CR tablet 20 mEq  20 mEq Oral Daily Ardelle Balls, PA   20 mEq at 04/30/12 1825  . sodium chloride 0.9 % injection 3 mL  3 mL Intravenous Q12H Ardelle Balls, PA   3 mL at 04/30/12 2149  . sodium chloride 0.9 % injection 3 mL  3 mL Intravenous PRN Ardelle Balls, PA      . traMADol Janean Sark) tablet 50-100 mg  50-100 mg Oral Q4H PRN Ardelle Balls, PA      . zolpidem (AMBIEN) tablet 5 mg  5 mg Oral QHS PRN Ardelle Balls, PA   5 mg at 04/30/12 2020  . DISCONTD: 0.45 % sodium chloride infusion   Intravenous Continuous Deniece Portela  E Gold, PA 20 mL/hr at 04/28/12 1800    . DISCONTD: 0.9 %  sodium chloride infusion   Intravenous Continuous Rowe Clack, PA 20 mL/hr at 04/28/12 1700    . DISCONTD: 0.9 %  sodium chloride infusion  250 mL Intravenous Continuous Rowe Clack, [place]      . DISCONTD: acetaminophen (TYLENOL) solution 975 mg  975 mg Per Tube Q6H Rowe Clack, PA      . DISCONTD: acetaminophen (TYLENOL) tablet 1,000 mg  1,000 mg Oral Q6H Wayne E Gold, PA   1,000 mg at 04/30/12 1324  . DISCONTD: aspirin chewable tablet 324 mg  324 mg Per Tube Daily Rowe Clack, PA      . DISCONTD: aspirin EC tablet 325 mg  325 mg Oral Daily Rowe Clack, PA   325 mg at 04/30/12 0930  . DISCONTD: atorvastatin (LIPITOR) tablet 80 mg  80 mg Oral q1800 Wayne E Gold, PA   80 mg at 04/29/12 1800  . DISCONTD: bisacodyl (DULCOLAX) EC tablet 10 mg  10 mg Oral Daily Rowe Clack, PA   10 mg at 04/30/12 0929  . DISCONTD: bisacodyl (DULCOLAX) suppository 10 mg  10 mg Rectal Daily Rowe Clack, PA      . DISCONTD: docusate sodium (COLACE) capsule 200 mg   200 mg Oral Daily Rowe Clack, PA   200 mg at 04/30/12 0929  . DISCONTD: HYDROmorphone (DILAUDID) tablet 2 mg  2 mg Oral Q3H PRN Alleen Borne, MD   2 mg at 04/30/12 1308  . DISCONTD: ketorolac (TORADOL) 15 MG/ML injection 15 mg  15 mg Intravenous Q6H PRN Alleen Borne, MD   15 mg at 04/30/12 0558  . DISCONTD: lactated ringers infusion   Intravenous Continuous Rowe Clack, PA 100 mL/hr at 04/28/12 1800    . DISCONTD: morphine 2 MG/ML injection 2-5 mg  2-5 mg Intravenous Q1H PRN Rowe Clack, PA   2 mg at 04/29/12 0825  . DISCONTD: nitroGLYCERIN 0.2 mg/mL in dextrose 5 % infusion  0-100 mcg/min Intravenous Continuous Rowe Clack, PA      . DISCONTD: ondansetron (ZOFRAN) injection 4 mg  4 mg Intravenous Q6H PRN Rowe Clack, PA   4 mg at 04/28/12 2356  . DISCONTD: pantoprazole (PROTONIX) EC tablet 40 mg  40 mg Oral Q1200 Alleen Borne, MD   40 mg at 04/30/12 1324  . DISCONTD: phenylephrine (NEO-SYNEPHRINE) 20,000 mcg in dextrose 5 % 250 mL infusion  0-100 mcg/min Intravenous Continuous Rowe Clack, PA   5 mcg/min at 04/29/12 2300  . DISCONTD: sodium chloride 0.9 % injection 3 mL  3 mL Intravenous Q12H Rowe Clack, PA   3 mL at 04/29/12 2142  . DISCONTD: sodium chloride 0.9 % injection 3 mL  3 mL Intravenous PRN Rowe Clack, PA      . DISCONTD: traMADol (ULTRAM) tablet 50 mg  50 mg Oral Q6H PRN Alleen Borne, MD   50 mg at 04/29/12 1642    PE: General appearance: alert, cooperative and no distress  Lungs: CTA, No Wheeze Heart: regular rate and rhythm, S1, S2 normal, no murmur, click, rub or gallop  Extremities: Trace Left LEE  Pulses: 2+ and symmetric  Skin: warm and dry  Neurologic: Grossly normal  Lab Results:   Basename 05/01/12 0604 04/30/12 0405 04/29/12 1655 04/29/12 1650  WBC 8.5 7.7 -- 9.6  HGB 10.9* 10.1* 11.2* --  HCT 32.1* 29.8*  33.0* --  PLT 174 143* -- 171   BMET  Basename 05/01/12 0604 04/30/12 0405 04/29/12 1655 04/29/12 0415  NA 133* 132* 136 --  K 4.1 3.7  3.5 --  CL 100 100 100 --  CO2 25 28 -- 23  GLUCOSE 102* 109* 112* --  BUN 7 8 7  --  CREATININE 0.88 0.82 0.80 --  CALCIUM 8.9 8.4 -- 8.6   PT/INR  Basename 04/28/12 1500  LABPROT 17.4*  INR 1.40    Assessment/Plan    Principal Problem:  *ST elevation myocardial infarction (STEMI) of inferior wall, initial episode of care - s/p PTCA of 95% ISR to 70% and 100% distal stent occlusion to ~40% (8/25) Active Problems:  CAD (coronary artery disease), history of RCA stent  Hyperlipidemia LDL goal < 70 - on Lipitor 10mg  @ home  CAD (coronary artery disease), native coronary artery -- early Mid LAD tandem 70% lesions with intervening ectatsia; extensive RCA disease with recurrent ISR/thrombosis - not PCI amenable. 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  Plan:   Doing well.  Pacer wires to be DCd today.  BP/ HR stable.  Likely DC home tomorrow.     LOS: 6 days    HAGER, BRYAN 05/01/2012 9:08 AM  I have seen and examined the patient along with Wilburt Finlay, PA.  I have reviewed the chart, notes and new data.  I agree with PA's note.  PLAN: Good progress. Anticipate DC tomorrow.  Jonathan Fair, MD, [facility] [phone] 05/01/2012, 9:44 AM

## 2012-05-01 NOTE — Progress Notes (Signed)
CARDIAC REHAB PHASE I   PRE:  Rate/Rhythm:90 SR BP:  Supine: 109/77  Sitting:    Standing:     SaO2:  95  MODE:  Ambulation: 550 ft   POST:  Rate/Rhythem: 100   BP:  Supine:   Sitting: 116/76  Standing:    SaO2: 95  3:15 pm  Referral received and appreciated.  Chart reviewed and work up completed.  Patient ambulated with assist x 1.  Pace good, gait steady.  Education completed .  Patient plans to attend Cardiac Rehab at [facility].    Vinetta Bergamo, Lavon Paganini

## 2012-05-01 NOTE — Progress Notes (Signed)
EPWs removed per MD order and protocol. Wire Ends intact. Pt tolerated procedure well. Pt resting in bed X 1 hr and wife present at bedside. VS stable and cycling. Will continue to monitor.

## 2012-05-02 MED ORDER — FUROSEMIDE 20 MG PO TABS: 20.0000 mg | ORAL_TABLET | Freq: Two times a day (BID) | ORAL | Status: DC

## 2012-05-02 MED ORDER — LACTULOSE 10 GM/15ML PO SOLN
10.0000 g | Freq: Every day | ORAL | Status: DC | PRN
  Administered 2012-05-02: 10 g via ORAL
  Filled 2012-05-02: qty 15

## 2012-05-02 MED ORDER — LACTULOSE 10 GM/15ML PO SOLN
10.0000 g | Freq: Every day | ORAL | Status: DC
  Filled 2012-05-02: qty 15

## 2012-05-02 MED ORDER — POTASSIUM CHLORIDE ER 10 MEQ PO TBCR: 10.0000 meq | EXTENDED_RELEASE_TABLET | Freq: Two times a day (BID) | ORAL | Status: DC

## 2012-05-02 NOTE — Progress Notes (Signed)
4 Days Post-Op Procedure(s) (LRB): CORONARY ARTERY BYPASS GRAFTING (CABG) (N/A)  Subjective: [name] has no complaints this morning.  He remains in NSR, and is hopeful to be discharged today.  No BM, + flatus  Objective: Vital signs in last 24 hours: Temp:  [98.4 F (36.9 C)-98.8 F (37.1 C)] 98.8 F (37.1 C) (09/01 0603) Pulse Rate:  [87-97] 89  (09/01 0603) Cardiac Rhythm:  [-] Sinus tachycardia (08/31 2009) Resp:  [16-18] 18  (09/01 0603) BP: (92-112)/(60-75) 111/74 mmHg (09/01 0603) SpO2:  [93 %-94 %] 93 % (09/01 0603)  Intake/Output from previous day: 08/31 0701 - 09/01 0700 In: 240 [P.O.:240] Out: -   General appearance: alert, cooperative and no distress Neurologic: intact Heart: regular rate and rhythm Lungs: clear to auscultation bilaterally Abdomen: soft, non-tender; bowel sounds normal; no masses,  no organomegaly Extremities: edema trace Wound: clean and dry  Lab Results:  [facility] 05/01/12 0604 04/30/12 0405  WBC 8.5 7.7  HGB 10.9* 10.1*  HCT 32.1* 29.8*  PLT 174 143*   BMET:  Basename 05/01/12 0604 04/30/12 0405  NA 133* 132*  K 4.1 3.7  CL 100 100  CO2 25 28  GLUCOSE 102* 109*  BUN 7 8  CREATININE 0.88 0.82  CALCIUM 8.9 8.4    PT/INR: No results found for this basename: LABPROT,INR in the last 72 hours ABG    Component Value Date/Time   PHART 7.364 04/28/2012 1926   HCO3 21.1 04/28/2012 1926   TCO2 24 04/29/2012 1655   ACIDBASEDEF 4.0* 04/28/2012 1926   O2SAT 94.0 04/28/2012 1926   CBG (last 3)  No results found for this basename: GLUCAP:3 in the last 72 hours  Assessment/Plan: S/P Procedure(s) (LRB): CORONARY ARTERY BYPASS GRAFTING (CABG) (N/A)  1. CV- NSR, continue Lopressor 2. Pulm- no acute issues, continue IS 3. LOC Constipation- patient drank prune juice this morning, ordered lactulose prn 4. Volume status-  Patients weight remains elevated from preop continue Lasix for short course at discharge 5. Dispo- patient is doing very  well, he is maintaining NSR, will plan for d/c today if patient has BM    LOS: 7 days    Raford Pitcher, Denny Peon 05/02/2012

## 2012-05-02 NOTE — Progress Notes (Signed)
[facility]  DAILY PROGRESS NOTE   Subjective:  No angina or dyspnea. No arrhythmia.  Objective:  Temp:  [98.4 F (36.9 C)-98.8 F (37.1 C)] 98.8 F (37.1 C) (09/01 0603) Pulse Rate:  [87-97] 89  (09/01 0603) Resp:  [16-18] 18  (09/01 0603) BP: (92-112)/(60-75) 111/74 mmHg (09/01 0603) SpO2:  [93 %-94 %] 93 % (09/01 0603) Weight change:   Intake/Output from previous day: 08/31 0701 - 09/01 0700 In: 240 [P.O.:240] Out: -   Intake/Output from this shift:    Medications: Current Facility-Administered Medications  Medication Dose Route Frequency Provider Last Rate Last Dose  . 0.9 %  sodium chloride infusion  250 mL Intravenous PRN Ardelle Balls, PA      . acetaminophen (TYLENOL) tablet 650 mg  650 mg Oral Q6H PRN Ardelle Balls, PA      . aspirin EC tablet 325 mg  325 mg Oral Daily Ardelle Balls, PA   325 mg at 05/01/12 1150  . atorvastatin (LIPITOR) tablet 10 mg  10 mg Oral QHS Ardelle Balls, PA   10 mg at 05/01/12 2136  . docusate sodium (COLACE) capsule 200 mg  200 mg Oral Daily Ardelle Balls, PA   200 mg at 05/01/12 1151  . furosemide (LASIX) tablet 40 mg  40 mg Oral Daily Ardelle Balls, PA   40 mg at 05/01/12 1152  . guaiFENesin (MUCINEX) 12 hr tablet 600 mg  600 mg Oral Q12H PRN Ardelle Balls, PA      . lactulose (CHRONULAC) 10 GM/15ML solution 10 g  10 g Oral Daily PRN Erin R Barrett, PA      . loratadine (CLARITIN) tablet 10 mg  10 mg Oral Daily Ardelle Balls, PA   10 mg at 05/01/12 1151  . metoprolol tartrate (LOPRESSOR) tablet 12.5 mg  12.5 mg Oral BID Ardelle Balls, PA   12.5 mg at 05/01/12 1152  . moving right along book   Does not apply Once Alleen Borne, MD      . ondansetron [facility]) tablet 4 mg  4 mg Oral Q6H PRN Ardelle Balls, PA       Or  . ondansetron [facility]) injection 4 mg  4 mg Intravenous Q6H PRN Ardelle Balls, PA      . oxyCODONE (Oxy  IR/ROXICODONE) immediate release tablet 5-10 mg  5-10 mg Oral Q3H PRN Ardelle Balls, PA   10 mg at 05/02/12 0725  . pantoprazole (PROTONIX) EC tablet 40 mg  40 mg Oral QAC breakfast Ardelle Balls, PA   40 mg at 05/02/12 0530  . potassium chloride SA (K-DUR,KLOR-CON) CR tablet 20 mEq  20 mEq Oral Daily Ardelle Balls, PA   20 mEq at 05/01/12 1152  . sodium chloride 0.9 % injection 3 mL  3 mL Intravenous Q12H Ardelle Balls, PA   3 mL at 05/01/12 2136  . sodium chloride 0.9 % injection 3 mL  3 mL Intravenous PRN Ardelle Balls, PA      . traMADol Janean Sark) tablet 50-100 mg  50-100 mg Oral Q4H PRN Ardelle Balls, PA      . zolpidem (AMBIEN) tablet 5 mg  5 mg Oral QHS PRN Ardelle Balls, PA   5 mg at 05/01/12 2135  . DISCONTD: lactulose (CHRONULAC) 10 GM/15ML solution 10 g  10 g Oral Daily Harriet Pho, PA        Physical  Exam: General appearance: alert, cooperative and no distress Neck: no adenopathy, no carotid bruit, no JVD, supple, symmetrical, trachea midline and thyroid not enlarged, symmetric, no tenderness/mass/nodules Lungs: clear to auscultation bilaterally and healthy sternotomy Heart: regular rate and rhythm, S1, S2 normal, no murmur, click, rub or gallop Abdomen: soft, non-tender; bowel sounds normal; no masses,  no organomegaly Extremities: extremities normal, atraumatic, no cyanosis or edema Pulses: 2+ and symmetric Skin: Skin color, texture, turgor normal. No rashes or lesions Neurologic: Alert and oriented X 3, normal strength and tone. Normal symmetric reflexes. Normal coordination and gait  Lab Results: Results for orders placed during the hospital encounter of 04/25/12 (from the past 48 hour(s))  CBC     Status: Abnormal   Collection Time   05/01/12  6:04 AM      Component Value Range Comment   WBC 8.5  4.0 - 10.5 K/uL    RBC 3.59 (*) 4.22 - 5.81 MIL/uL    Hemoglobin 10.9 (*) 13.0 - 17.0 g/dL    HCT 96.0 (*) 45.4 - 52.0 %     MCV 89.4  78.0 - 100.0 fL    MCH 30.4  26.0 - 34.0 pg    MCHC 34.0  30.0 - 36.0 g/dL    RDW 09.8  11.9 - 14.7 %    Platelets 174  150 - 400 K/uL   BASIC METABOLIC PANEL     Status: Abnormal   Collection Time   05/01/12  6:04 AM      Component Value Range Comment   Sodium 133 (*) 135 - 145 mEq/L    Potassium 4.1  3.5 - 5.1 mEq/L    Chloride 100  96 - 112 mEq/L    CO2 25  19 - 32 mEq/L    Glucose, Bld 102 (*) 70 - 99 mg/dL    BUN 7  6 - 23 mg/dL    Creatinine, Ser 8.29  0.50 - 1.35 mg/dL    Calcium 8.9  8.4 - 56.2 mg/dL    GFR calc non Af Amer 88 (*) >90 mL/min    GFR calc Af Amer >90  >90 mL/min     Imaging: Dg Chest 2 View  05/01/2012  *RADIOLOGY REPORT*  Clinical Data: Status post CABG.  Coronary artery disease. Hypertension.  CHEST - 2 VIEW  Comparison: 04/30/2012  Findings: Prior median sternotomy. Numerous leads and wires project over the chest.  Removal of right IJ Cordis sheath.  Mild cardiomegaly.  Suspect trace bilateral pleural effusions. No pneumothorax.  Mild left base atelectasis which is similar. Apparent nodular density projecting over the right upper lobe is favored to be related to the EKG lead.  IMPRESSION:  1.  Similar left base atelectasis with trace bilateral pleural effusions. 2.  Removal of right IJ Cordis sheath. No pneumothorax.   Original Report Authenticated By: Consuello Bossier, M.D.     Assessment:  1. Principal Problem: 2.  *ST elevation myocardial infarction (STEMI) of inferior wall, initial episode of care - s/p PTCA of 95% ISR to 70% and 100% distal stent occlusion to ~40% (8/25) 3. Active Problems: 4.  CAD (coronary artery disease), history of RCA stent 5.  Hyperlipidemia LDL goal < 70 - on Lipitor 10mg  @ home 6.  CAD (coronary artery disease), native coronary artery -- early Mid LAD tandem 70% lesions with intervening ectatsia; extensive RCA disease with recurrent ISR/thrombosis - not PCI amenable. Plan CABG 7.  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  8.   Plan:  1. DC today.  F/U w Dr. Herbie Baltimore 2-3 weeks, we will call with appt.  Time Spent Directly with Patient:  10 minutes  Length of Stay:  LOS: 7 days    Jonathan Escobar 05/02/2012, 10:22 AM

## 2012-05-02 NOTE — Progress Notes (Signed)
Removed CT sutures x4 and applied benzoin and 1/2 " steri strips. Pt tolerated the procedure well. Pt given signs and symptoms of infection. Thomas Hoff

## 2012-05-11 ENCOUNTER — Encounter: Payer: Self-pay | Admitting: Surgery

## 2012-05-13 ENCOUNTER — Other Ambulatory Visit: Payer: Self-pay

## 2012-05-13 DIAGNOSIS — G8918 Other acute postprocedural pain: Secondary | ICD-10-CM

## 2012-05-13 MED ORDER — HYDROCODONE-ACETAMINOPHEN 7.5-500 MG PO TABS: 1.0000 | ORAL_TABLET | Freq: Four times a day (QID) | ORAL | Status: AC | PRN

## 2012-05-13 NOTE — Telephone Encounter (Signed)
RX for Lortab 7.5/500 mg 1 tab every 4-6 hrs prn pain #40/0 refill call to pharm. Pt aware

## 2012-05-21 ENCOUNTER — Other Ambulatory Visit: Payer: Self-pay | Admitting: Surgery

## 2012-05-21 DIAGNOSIS — I2510 Atherosclerotic heart disease of native coronary artery without angina pectoris: Secondary | ICD-10-CM

## 2012-05-25 ENCOUNTER — Ambulatory Visit
Admission: RE | Admit: 2012-05-25 | Discharge: 2012-05-25 | Disposition: A | Discharge status: Home / Self Care | Payer: PRIVATE HEALTH INSURANCE | Source: Ambulatory Visit | Attending: Surgery | Admitting: Surgery

## 2012-05-25 ENCOUNTER — Encounter: Payer: Self-pay | Admitting: Surgery

## 2012-05-25 ENCOUNTER — Ambulatory Visit (INDEPENDENT_AMBULATORY_CARE_PROVIDER_SITE_OTHER): Payer: Self-pay | Admitting: Surgery

## 2012-05-25 VITALS — BP 136/87 | HR 67 | Resp 18 | Ht 70.0 in | Wt 215.0 lb

## 2012-05-25 DIAGNOSIS — Z951 Presence of aortocoronary bypass graft: Secondary | ICD-10-CM

## 2012-05-25 DIAGNOSIS — I2510 Atherosclerotic heart disease of native coronary artery without angina pectoris: Secondary | ICD-10-CM

## 2012-05-25 NOTE — Progress Notes (Signed)
                    [address]            [place] [address]          [phone]      HPI: Patient returns for routine postoperative follow-up having undergone coronary bypass graft surgery x4 on 04/20/2012. The patient's early postoperative recovery while in the hospital was notable for an uncomplicated postoperative course. Since hospital discharge the patient reports he has been feeling well. He is walking daily without chest pain or shortness of breath.   Current Outpatient Prescriptions  Medication Sig Dispense Refill  . atorvastatin (LIPITOR) 10 MG tablet Take 10 mg by mouth at bedtime.      . metoprolol tartrate (LOPRESSOR) 25 MG tablet Take 0.5 tablets (12.5 mg total) by mouth 2 (two) times daily.  30 tablet  1  . omeprazole (PRILOSEC) 20 MG capsule Take 20 mg by mouth at bedtime.      Marland Kitchen OVER THE COUNTER MEDICATION Take 1 capsule by mouth daily. Medication:MegaRed Omega-3 Krill Oil      . Psyllium (METAMUCIL PO) Take by mouth daily. Takes 1 teaspoon of Metamucil daily        Physical Exam: BP 136/87  Pulse 67  Resp 18  Ht 5\' 10"  (1.778 m)  Wt 215 lb (97.523 kg)  BMI 30.85 kg/m2  SpO2 98% He looks well. Cardiac exam shows a regular rate and rhythm with normal heart sounds. Lung exam is clear. Chest incision is healing well and the sternum is stable. The leg incision is healing well and there is no peripheral edema.  Diagnostic Tests:  *RADIOLOGY REPORT*   Clinical Data: Follow-up heart surgery.   CHEST - 2 VIEW   Comparison: 05/01/2012   Findings: Prior CABG.  Heart and mediastinal contours are within normal limits.  No focal opacities or effusions.  No acute bony abnormality.  No pneumothorax.   IMPRESSION: No active cardiopulmonary disease.     Original Report Authenticated By: Cyndie Chime, M.D.     Impression:  Overall he is making a good recovery following his surgery. I encouraged him to keep walking as much as possible.  He is planning on participating in cardiac rehabilitation. 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  Plan:   He will continue followup with Dr. Herbie Baltimore and will contact me if he develops any problems with his incisions.

## 2012-08-05 ENCOUNTER — Other Ambulatory Visit (HOSPITAL_COMMUNITY): Payer: Self-pay | Admitting: Cardiology

## 2012-08-05 DIAGNOSIS — Z951 Presence of aortocoronary bypass graft: Secondary | ICD-10-CM

## 2012-08-05 DIAGNOSIS — I219 Acute myocardial infarction, unspecified: Secondary | ICD-10-CM

## 2012-08-19 ENCOUNTER — Ambulatory Visit (HOSPITAL_COMMUNITY)
Admission: RE | Admit: 2012-08-19 | Discharge: 2012-08-19 | Disposition: A | Discharge status: Home / Self Care | Payer: PRIVATE HEALTH INSURANCE | Source: Ambulatory Visit | Attending: Cardiology | Admitting: Cardiology

## 2012-08-19 DIAGNOSIS — E785 Hyperlipidemia, unspecified: Secondary | ICD-10-CM | POA: Insufficient documentation

## 2012-08-19 DIAGNOSIS — I219 Acute myocardial infarction, unspecified: Secondary | ICD-10-CM

## 2012-08-19 DIAGNOSIS — I10 Essential (primary) hypertension: Secondary | ICD-10-CM | POA: Insufficient documentation

## 2012-08-19 DIAGNOSIS — I2510 Atherosclerotic heart disease of native coronary artery without angina pectoris: Secondary | ICD-10-CM | POA: Insufficient documentation

## 2012-08-19 DIAGNOSIS — Z951 Presence of aortocoronary bypass graft: Secondary | ICD-10-CM | POA: Insufficient documentation

## 2012-08-19 DIAGNOSIS — I252 Old myocardial infarction: Secondary | ICD-10-CM | POA: Insufficient documentation

## 2012-08-19 DIAGNOSIS — Z87891 Personal history of nicotine dependence: Secondary | ICD-10-CM | POA: Insufficient documentation

## 2012-08-19 NOTE — Progress Notes (Signed)
[facility]   2D echo completed 08/19/2012.   Cindy Aalyiah Camberos, RDCS   

## 2012-09-07 ENCOUNTER — Other Ambulatory Visit: Payer: Self-pay | Admitting: General Practice

## 2012-09-07 LAB — COMPREHENSIVE METABOLIC PANEL
Alkaline Phosphatase: 102 U/L (ref 50–136)
Anion Gap: 9 (ref 7–16)
BUN: 12 mg/dL (ref 7–18)
Calcium, Total: 8.7 mg/dL (ref 8.5–10.1)
Chloride: 102 mmol/L (ref 98–107)
EGFR (African American): >60
EGFR (Non-African Amer.): >60
Glucose: 90 mg/dL (ref 65–99)
Osmolality: 271 (ref 275–301)
Potassium: 3.8 mmol/L (ref 3.5–5.1)
SGOT(AST): 30 U/L (ref 15–37)
SGPT (ALT): 41 U/L (ref 12–78)
Total Protein: 8 g/dL (ref 6.4–8.2)

## 2012-09-07 LAB — CBC WITH DIFFERENTIAL/PLATELET
Basophil #: 0 10*3/uL (ref 0.0–0.1)
Eosinophil %: 0.7 %
HCT: 39.5 % — ABNORMAL LOW (ref 40.0–52.0)
HGB: 13.3 g/dL (ref 13.0–18.0)
Lymphocyte #: 0.7 10*3/uL — ABNORMAL LOW (ref 1.0–3.6)
Lymphocyte %: 16.5 %
Monocyte #: 0.8 x10 3/mm (ref 0.2–1.0)
Monocyte %: 16.7 %
Neutrophil #: 3 10*3/uL (ref 1.4–6.5)
Neutrophil %: 65.6 %
Platelet: 258 10*3/uL (ref 150–440)
RBC: 4.87 10*6/uL (ref 4.40–5.90)
RDW: 15 % — ABNORMAL HIGH (ref 11.5–14.5)
WBC: 4.5 10*3/uL (ref 3.8–10.6)

## 2012-10-17 ENCOUNTER — Ambulatory Visit: Payer: Self-pay | Admitting: Family Medicine

## 2012-10-17 ENCOUNTER — Emergency Department: Payer: Self-pay | Admitting: Emergency Medicine

## 2012-11-17 DIAGNOSIS — Z0271 Encounter for disability determination: Secondary | ICD-10-CM

## 2013-04-26 ENCOUNTER — Encounter: Payer: Self-pay | Admitting: Cardiology

## 2013-04-26 ENCOUNTER — Ambulatory Visit (INDEPENDENT_AMBULATORY_CARE_PROVIDER_SITE_OTHER): Payer: 59 | Admitting: Cardiology

## 2013-04-26 VITALS — BP 128/80 | HR 62 | Ht 70.0 in | Wt 220.2 lb

## 2013-04-26 DIAGNOSIS — R030 Elevated blood-pressure reading, without diagnosis of hypertension: Secondary | ICD-10-CM

## 2013-04-26 DIAGNOSIS — I2119 ST elevation (STEMI) myocardial infarction involving other coronary artery of inferior wall: Secondary | ICD-10-CM

## 2013-04-26 DIAGNOSIS — E785 Hyperlipidemia, unspecified: Secondary | ICD-10-CM

## 2013-04-26 DIAGNOSIS — Z9861 Coronary angioplasty status: Secondary | ICD-10-CM

## 2013-04-26 DIAGNOSIS — I2510 Atherosclerotic heart disease of native coronary artery without angina pectoris: Secondary | ICD-10-CM

## 2013-04-26 DIAGNOSIS — Z951 Presence of aortocoronary bypass graft: Secondary | ICD-10-CM

## 2013-04-26 MED ORDER — METOPROLOL SUCCINATE ER 25 MG PO TB24: 25.0000 mg | ORAL_TABLET | Freq: Every day | ORAL | Status: DC

## 2013-04-26 MED ORDER — ATORVASTATIN CALCIUM 20 MG PO TABS: 20.0000 mg | ORAL_TABLET | Freq: Every day | ORAL | Status: DC

## 2013-04-26 NOTE — Patient Instructions (Addendum)
You seem to be stable.  Need to get back to exercising!  We need to check your lipids.  Provided that you are still doing well, we will check back in 1 year.  Marykay Lex, MD

## 2013-05-15 ENCOUNTER — Encounter: Payer: Self-pay | Admitting: Cardiology

## 2013-05-15 DIAGNOSIS — E785 Hyperlipidemia, unspecified: Secondary | ICD-10-CM | POA: Insufficient documentation

## 2013-05-15 NOTE — Assessment & Plan Note (Signed)
Relatively preserved ejection fraction despite having MI. Would appear that some of his anterior wall motion a be consistent with the LAD lesions having been problematic in the setting of inferior MI. I suggested that he consider getting back into cardiac rehabilitation if he is not finding time or to drive to get back into exercise on his own -- he did very well while on cardiac rehabilitation, admitting potentially be beneficial thing for him.

## 2013-05-15 NOTE — Assessment & Plan Note (Addendum)
He's not having any anginal type symptoms heart failure symptoms.  Probably some fatigue and dyspnea may simply just be because he was put back on some weight and not as active as he used to be. I asked him to continue to monitor symptoms. His dyspnea and fatigue gets worse, we may need to be evaluated with a stress test possibly an echocardiogram to make sure that nothing is changed. Otherwise I think he would be stable for annual followup. Recommend continued exercise in order to get back into shape.  Plan: Continue low-dose beta blocker. With fatigue and his baseline bradycardia, I am not really inclined to increase the dose. He is also on statin and low-dose. Not on ACE inhibitor therapy because of intolerance in the past and borderline hypotension when taking it.

## 2013-05-15 NOTE — Assessment & Plan Note (Addendum)
He is due for his lipid panel checked -- his most recent labs were not quite at goal. Hopefully with exercise, we will be able to see more decrease in LDL levels and increasing HDL levels.. Seems to be doing okay on the Lipitor at current dose. We'll see if he needs any changes, when we check followup lipid labs and chemistry.  Plan: For now continue atorvastatin 20 mg daily. Recheck lipid panel and chemistry panel.

## 2013-05-15 NOTE — Assessment & Plan Note (Signed)
He seems to be able to tolerate the low dose beta blocker, his fatigue is new, and his been on beta blocker for quite some time. At this low dose, I don't think it is related.

## 2013-05-15 NOTE — Progress Notes (Signed)
Patient ID: Jonathan Escobar, male   DOB: May 16, 1946, 67 y.o.   MRN: [id number] PCP: Bari Edward, MD  Clinic Note: Chief Complaint  Patient presents with  . ROV 12 months    Occasional Shortness of breath on exertion, mild swelling in left leg due to surgery   HPI: Jonathan Escobar is a 67 y.o. male with a PMH below who presents today for six-month followup. He is a very pleasant gentleman who I met time hasn't used any in almost 2013. He had a long history of RCA disease dating back to 1999 with PCI to the RCA with a bare-metal stent followed by breaking therapy one year later and redo PCI 2005 DES stent. Upon presentation he was found to have 95% in-stent restenosis of the stented segment followed by an ulnar percent stenosis post-stent. D2 significant bifurcation lesions of the LAD D1, he had balloon angioplasty of the RCA and was referred for urgent CABG. He initially did quite well post CABG, to cardiac rehabilitation lost 10 pounds. Unfortunately he is put back on some weight and has not been exercising much as he used to.  Interval History: He denies any chest tightness or pressure with rest or exertion he does feel tired a lot and on occasion he will get short of breath with exerting himself. Very short of breath at rest. No PND, orthopnea or edema. His wife notes, that he gets tired a lot, but denies any abnormalities with sleeping. No suggestion of OSA.  The remainder of Cardiovascular ROS: negative for - edema, irregular heartbeat, loss of consciousness, murmur, orthopnea, palpitations, paroxysmal nocturnal dyspnea, rapid heart rate or shortness of breath is as follows: Additional cardiac review of systems: Lightheadedness - no, dizziness - no, syncope/near-syncope - no; TIA/amaurosis fugax - no Melena - no, hematochezia no; hematuria - no; nosebleeds - no; claudication - no  He did have a strange occurrence where he had a fall in February and his head and a concussion. Didn't sound  like this was syncopal and near-syncopal. He may have slipped walking into the clinic.  Past Medical History  Diagnosis Date  . CAD S/P percutaneous coronary angioplasty 1999    BMS to prox RCA 1999; Brachytherapy ~2003, followed by DES  PCI ([facility]) for ISR.  Marland Kitchen Coronary stent restenosis due to progression of disease 2000; 2005    (While Still In [place]) Status post Brachytherapy to RCA ISR - 2000; Redo PCI with DES 2005  . History of: ST elevation myocardial infarction (STEMI) of inferior wall, subsequent episode of care April 25, 2012    RCA: 95% ISR followed by 100% stenosis post-stent with significant RPL lesions; LAD: Tandem 80 and 70% lesions involving D1 (1, 1, 1) --> initial angioplasty to RCA aspiration thrombectomy --> urgent CABG  . S/P CABG x 4: LIMA-LAD, seq SVG-PD-PLA; SVG-diag. 04/28/2012  . H/O echocardiogram 08/19/2012    EF 50-55%, moderate HK of anteroseptal wall, septal dyskinesis/dyssynergy due to poststernotomy;  grade 1 diastolic dysfunction. Mildly dilated LA, mildly reduced RV function-likely due to septal dyssynergy.  . Borderline hypertension   . Hyperlipidemia LDL goal <70   . History of GI bleed      while on Plavix  . History of viral meningitis     following GI bleed   . Obesity (BMI 30.0-34.9)   . CAD (coronary artery disease), native coronary artery -- early Mid LAD tandem 70% lesions with intervening ectatsia; extensive RCA disease with recurrent ISR/thrombosis - not PCI amenable. Plan CABG  04/25/2012    Prior Cardiac Evaluation and Past Surgical History: Past Surgical History  Procedure Laterality Date  . Hernia repair      remotely  . Coronary artery bypass graft  04/28/2012    Procedure: CORONARY ARTERY BYPASS GRAFTING (CABG);  Surgeon: Alleen Borne, MD;  Location: [facility] OR;  Service: Open Heart Surgery;  Laterality: N/A;    Allergies  Allergen Reactions  . Ace Inhibitors Other (See Comments)    Cough   . Amoxicillin Rash    Flu like symptoms     Current Outpatient Prescriptions  Medication Sig Dispense Refill  . ALPRAZolam (XANAX) 0.25 MG tablet Take 0.125 mg by mouth at bedtime as needed for sleep.      . Ascorbic Acid (VITAMIN C) 1000 MG tablet Take 1,000 mg by mouth daily.      Marland Kitchen atorvastatin (LIPITOR) 20 MG tablet Take 1 tablet (20 mg total) by mouth daily.  90 tablet  3  . metoprolol succinate (TOPROL-XL) 25 MG 24 hr tablet Take 1 tablet (25 mg total) by mouth daily.  180 tablet  3  . Multiple Vitamin (MULTIVITAMIN) tablet Take 1 tablet by mouth daily.      Marland Kitchen omeprazole (PRILOSEC) 20 MG capsule Take 20 mg by mouth at bedtime.      . Psyllium (METAMUCIL PO) Takes 1 teaspoon of Metamucil daily       No current facility-administered medications for this visit.    History   Social History Narrative   He is a married father of 3, grandfather and 2.   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   He completed cardiac rehabilitation but as Nedra Hai got out of the habit of exercising.    ROS: A comprehensive Review of Systems - Negative except Pertinent positives noted above. Notably no signs of GI bleed.  PHYSICAL EXAM BP 128/80  Pulse 62  Ht 5\' 10"  (1.778 m)  Wt 220 lb 3.2 oz (99.882 kg)  BMI 31.6 kg/m2 General appearance: alert, cooperative, appears stated age, no distress, mildly obese and Well-groomed healthy-appearing. Answers questions appropriately. Neck: no adenopathy, no carotid bruit, no JVD, supple, symmetrical, trachea midline and thyroid not enlarged, symmetric, no tenderness/mass/nodules Lungs: clear to auscultation bilaterally, normal percussion bilaterally and Nonlabored with good air movement. Heart: regular rate and rhythm, S1, S2 normal, no murmur, click, rub or gallop, normal apical  impulse and He does have occasionally split S2 but very infrequently. Abdomen: soft, non-tender; bowel sounds normal; no masses,  no organomegaly and Mild to moderate truncal obesity Extremities: extremities normal, atraumatic, no cyanosis or edema, no edema, redness or tenderness in the calves or thighs and no ulcers, gangrene or trophic changes Pulses: 2+ and symmetric Neurologic: Grossly normal HEENT: [place]/AT, EOMI, MMM, anicteric sclera - he wears glasses   [id number] today: Yes Rate: 62 , Rhythm: Normal Sinus Rhythm, Normal ECG;    Recent Labs: January 2014  Total cholesterol 153, triglycerides 185, HDL 31 LDL 85 --> not quite at goal with HDL being low and LDL goal of 70.  ASSESSMENT / PLAN: CAD (coronary artery disease), native coronary artery -- Mid LAD/D1 tandem 70% lesions; extensive RCA disease with recurrent ISR/thrombosis - not PCI amenable. PTCA--> CABGx4 He's not having any anginal type symptoms  heart failure symptoms.  Probably some fatigue and dyspnea may simply just be because he was put back on some weight and not as active as he used to be. I asked him to continue to monitor symptoms. His dyspnea and fatigue gets worse, we may need to be evaluated with a stress test possibly an echocardiogram to make sure that nothing is changed. Otherwise I think he would be stable for annual followup. Recommend continued exercise in order to get back into shape.  Plan: Continue low-dose beta blocker. With fatigue and his baseline bradycardia, I am not really inclined to increase the dose. He is also on statin and low-dose. Not on ACE inhibitor therapy because of intolerance in the past and borderline hypotension when taking it.  CAD S/P percutaneous coronary angioplasty Part of the reason why he was sent for CABG was because the stent of disease in the RCA and the difficulties he had no stents in the past. He was felt the better suited for CABG. He also had GI bleed while on Plavix makes  him not a great dual antiplatelet candidate.  History of: ST elevation myocardial infarction (STEMI) of inferior wall, -->s/p PTCA of 95% ISR to 70% and 100% RCA --> urgent CABG do to LAD-diagonal lesion Relatively preserved ejection fraction despite having MI. Would appear that some of his anterior wall motion a be consistent with the LAD lesions having been problematic in the setting of inferior MI. I suggested that he consider getting back into cardiac rehabilitation if he is not finding time or to drive to get back into exercise on his own -- he did very well while on cardiac rehabilitation, admitting potentially be beneficial thing for him.  Borderline hypertension He seems to be able to tolerate the low dose beta blocker, his fatigue is new, and his been on beta blocker for quite some time. At this low dose, I don't think it is related.  Hyperlipidemia LDL goal <70 He is due for his lipid panel checked -- his most recent labs were not quite at goal. Hopefully with exercise, we will be able to see more decrease in LDL levels and increasing HDL levels.. Seems to be doing okay on the Lipitor at current dose. We'll see if he needs any changes, when we check followup lipid labs and chemistry.  Plan: For now continue atorvastatin 20 mg daily. Recheck lipid panel and chemistry panel.    Orders Placed This Encounter  Procedures  . Lipid Profile  . Comp Met (CMET)  . EKG 12-Lead   Followup: One year  DAVID W. Herbie Baltimore, M.D., M.S. [facility] [address] [place], [place]  [address]  [phone] Pager # [phone]

## 2013-05-15 NOTE — Assessment & Plan Note (Signed)
Part of the reason why he was sent for CABG was because the stent of disease in the RCA and the difficulties he had no stents in the past. He was felt the better suited for CABG. He also had GI bleed while on Plavix makes him not a great dual antiplatelet candidate.

## 2013-07-02 DIAGNOSIS — I25810 Atherosclerosis of coronary artery bypass graft(s) without angina pectoris: Secondary | ICD-10-CM

## 2013-07-02 HISTORY — DX: Atherosclerosis of coronary artery bypass graft(s) without angina pectoris: I25.810

## 2013-07-29 ENCOUNTER — Encounter (HOSPITAL_COMMUNITY)
Admission: EM | Disposition: A | Discharge status: Home / Self Care | Payer: Medicare Other | Source: Ambulatory Visit | Attending: Cardiology

## 2013-07-29 ENCOUNTER — Encounter (HOSPITAL_COMMUNITY): Payer: Self-pay | Admitting: *Deleted

## 2013-07-29 ENCOUNTER — Inpatient Hospital Stay (HOSPITAL_COMMUNITY)
Admission: EM | Admit: 2013-07-29 | Discharge: 2013-08-02 | DRG: 247 | Disposition: A | Discharge status: Home / Self Care | Payer: 59 | Source: Ambulatory Visit | Attending: Cardiology | Admitting: Cardiology

## 2013-07-29 DIAGNOSIS — I252 Old myocardial infarction: Secondary | ICD-10-CM

## 2013-07-29 DIAGNOSIS — Z955 Presence of coronary angioplasty implant and graft: Secondary | ICD-10-CM

## 2013-07-29 DIAGNOSIS — Y849 Medical procedure, unspecified as the cause of abnormal reaction of the patient, or of later complication, without mention of misadventure at the time of the procedure: Secondary | ICD-10-CM | POA: Diagnosis present

## 2013-07-29 DIAGNOSIS — I959 Hypotension, unspecified: Secondary | ICD-10-CM | POA: Diagnosis not present

## 2013-07-29 DIAGNOSIS — I213 ST elevation (STEMI) myocardial infarction of unspecified site: Secondary | ICD-10-CM

## 2013-07-29 DIAGNOSIS — Z8719 Personal history of other diseases of the digestive system: Secondary | ICD-10-CM

## 2013-07-29 DIAGNOSIS — T82897A Other specified complication of cardiac prosthetic devices, implants and grafts, initial encounter: Secondary | ICD-10-CM | POA: Diagnosis present

## 2013-07-29 DIAGNOSIS — Z79899 Other long term (current) drug therapy: Secondary | ICD-10-CM

## 2013-07-29 DIAGNOSIS — I2119 ST elevation (STEMI) myocardial infarction involving other coronary artery of inferior wall: Principal | ICD-10-CM

## 2013-07-29 DIAGNOSIS — I25810 Atherosclerosis of coronary artery bypass graft(s) without angina pectoris: Secondary | ICD-10-CM

## 2013-07-29 DIAGNOSIS — I2510 Atherosclerotic heart disease of native coronary artery without angina pectoris: Secondary | ICD-10-CM

## 2013-07-29 DIAGNOSIS — Z87891 Personal history of nicotine dependence: Secondary | ICD-10-CM

## 2013-07-29 DIAGNOSIS — I219 Acute myocardial infarction, unspecified: Secondary | ICD-10-CM

## 2013-07-29 DIAGNOSIS — R030 Elevated blood-pressure reading, without diagnosis of hypertension: Secondary | ICD-10-CM

## 2013-07-29 DIAGNOSIS — Z9861 Coronary angioplasty status: Secondary | ICD-10-CM

## 2013-07-29 DIAGNOSIS — Z951 Presence of aortocoronary bypass graft: Secondary | ICD-10-CM

## 2013-07-29 DIAGNOSIS — E669 Obesity, unspecified: Secondary | ICD-10-CM | POA: Diagnosis present

## 2013-07-29 DIAGNOSIS — E785 Hyperlipidemia, unspecified: Secondary | ICD-10-CM

## 2013-07-29 HISTORY — PX: LEFT HEART CATHETERIZATION WITH CORONARY/GRAFT ANGIOGRAM: SHX5450

## 2013-07-29 HISTORY — DX: ST elevation (STEMI) myocardial infarction of unspecified site: I21.3

## 2013-07-29 HISTORY — PX: PERCUTANEOUS CORONARY STENT INTERVENTION (PCI-S): SHX5485

## 2013-07-29 LAB — CK TOTAL AND CKMB (NOT AT ARMC)
CK, MB: 1.9 ng/mL (ref 0.3–4.0)
Total CK: 59 U/L (ref 7–232)

## 2013-07-29 LAB — HEMOGLOBIN A1C
Hgb A1c MFr Bld: 5.9 % — ABNORMAL HIGH (ref <5.7)
Mean Plasma Glucose: 123 mg/dL — ABNORMAL HIGH (ref <117)

## 2013-07-29 LAB — POCT I-STAT, CHEM 8
BUN: 8 mg/dL (ref 6–23)
Calcium, Ion: 1.21 mmol/L (ref 1.13–1.30)
Chloride: 101 meq/L (ref 96–112)
Creatinine, Ser: 0.9 mg/dL (ref 0.50–1.35)
Glucose, Bld: 121 mg/dL — ABNORMAL HIGH (ref 70–99)
HCT: 43 % (ref 39.0–52.0)
Hemoglobin: 14.6 g/dL (ref 13.0–17.0)
Potassium: 3.2 meq/L — ABNORMAL LOW (ref 3.5–5.1)
Sodium: 137 meq/L (ref 135–145)
TCO2: 20 mmol/L (ref 0–100)

## 2013-07-29 LAB — COMPREHENSIVE METABOLIC PANEL
BUN: 10 mg/dL (ref 6–23)
CO2: 21 mEq/L (ref 19–32)
Chloride: 99 mEq/L (ref 96–112)
Creatinine, Ser: 0.67 mg/dL (ref 0.50–1.35)
GFR calc non Af Amer: >90 mL/min (ref >90)
Total Bilirubin: 0.4 mg/dL (ref 0.3–1.2)

## 2013-07-29 LAB — POCT ACTIVATED CLOTTING TIME: Activated Clotting Time: 329 s

## 2013-07-29 LAB — PROTIME-INR
INR: 1.16 (ref 0.00–1.49)
Prothrombin Time: 14.6 seconds (ref 11.6–15.2)

## 2013-07-29 LAB — APTT: aPTT: 27 seconds (ref 24–37)

## 2013-07-29 LAB — CBC
HCT: 38.4 % — ABNORMAL LOW (ref 39.0–52.0)
MCH: 30.3 pg (ref 26.0–34.0)
MCV: 85.5 fL (ref 78.0–100.0)
RBC: 4.49 MIL/uL (ref 4.22–5.81)
RDW: 13.1 % (ref 11.5–15.5)
WBC: 10.3 10*3/uL (ref 4.0–10.5)

## 2013-07-29 LAB — TROPONIN I: Troponin I: 2.5 ng/mL (ref <0.30)

## 2013-07-29 SURGERY — LEFT HEART CATHETERIZATION WITH CORONARY/GRAFT ANGIOGRAM

## 2013-07-29 MED ORDER — BIVALIRUDIN 250 MG IV SOLR
INTRAVENOUS | Status: AC
  Filled 2013-07-29: qty 250

## 2013-07-29 MED ORDER — SODIUM CHLORIDE 0.9 % IJ SOLN
3.0000 mL | Freq: Two times a day (BID) | INTRAMUSCULAR | Status: DC
  Administered 2013-07-30 – 2013-07-31 (×5): 3 mL via INTRAVENOUS

## 2013-07-29 MED ORDER — POTASSIUM CHLORIDE CRYS ER 20 MEQ PO TBCR
40.0000 meq | EXTENDED_RELEASE_TABLET | ORAL | Status: AC
  Administered 2013-07-29 – 2013-07-30 (×2): 40 meq via ORAL
  Filled 2013-07-29 (×2): qty 4
  Filled 2013-07-29 (×2): qty 2

## 2013-07-29 MED ORDER — ACETAMINOPHEN 325 MG PO TABS: 650.0000 mg | ORAL_TABLET | ORAL | Status: DC | PRN

## 2013-07-29 MED ORDER — ONDANSETRON HCL 4 MG/2ML IJ SOLN
4.0000 mg | Freq: Four times a day (QID) | INTRAMUSCULAR | Status: DC | PRN
  Administered 2013-07-29: 4 mg via INTRAVENOUS
  Filled 2013-07-29: qty 2

## 2013-07-29 MED ORDER — HEPARIN (PORCINE) IN NACL 2-0.9 UNIT/ML-% IJ SOLN
INTRAMUSCULAR | Status: AC
  Filled 2013-07-29: qty 1500

## 2013-07-29 MED ORDER — ALUM & MAG HYDROXIDE-SIMETH 200-200-20 MG/5ML PO SUSP
30.0000 mL | ORAL | Status: DC | PRN
  Administered 2013-07-30: 30 mL via ORAL
  Filled 2013-07-29: qty 30

## 2013-07-29 MED ORDER — LIDOCAINE HCL (PF) 1 % IJ SOLN
INTRAMUSCULAR | Status: AC
  Filled 2013-07-29: qty 30

## 2013-07-29 MED ORDER — OXYCODONE-ACETAMINOPHEN 5-325 MG PO TABS
1.0000 | ORAL_TABLET | ORAL | Status: DC | PRN
Start: 2013-07-29 — End: 2013-08-02

## 2013-07-29 MED ORDER — NITROGLYCERIN 0.2 MG/ML ON CALL CATH LAB
INTRAVENOUS | Status: AC
  Filled 2013-07-29: qty 1

## 2013-07-29 MED ORDER — SODIUM CHLORIDE 0.9 % IV SOLN
1.0000 mL/kg/h | INTRAVENOUS | Status: AC
  Administered 2013-07-29: 1 mL/kg/h via INTRAVENOUS

## 2013-07-29 MED ORDER — CLOPIDOGREL BISULFATE 300 MG PO TABS
ORAL_TABLET | ORAL | Status: AC
  Filled 2013-07-29: qty 2

## 2013-07-29 MED ORDER — DOPAMINE-DEXTROSE 3.2-5 MG/ML-% IV SOLN
INTRAVENOUS | Status: AC
  Filled 2013-07-29: qty 250

## 2013-07-29 MED ORDER — NITROGLYCERIN 0.4 MG SL SUBL: 0.4000 mg | SUBLINGUAL_TABLET | SUBLINGUAL | Status: DC | PRN

## 2013-07-29 MED ORDER — LIDOCAINE-EPINEPHRINE 1 %-1:100000 IJ SOLN
INTRAMUSCULAR | Status: AC
  Filled 2013-07-29: qty 1

## 2013-07-29 MED ORDER — PHENTOLAMINE MESYLATE 5 MG IJ SOLR
5.0000 mg | Freq: Once | INTRAMUSCULAR | Status: AC
  Administered 2013-07-29: 5 mg via INTRAVENOUS
  Filled 2013-07-29: qty 5

## 2013-07-29 MED ORDER — CLOPIDOGREL BISULFATE 75 MG PO TABS
75.0000 mg | ORAL_TABLET | Freq: Every day | ORAL | Status: DC
  Administered 2013-07-30 – 2013-08-01 (×3): 75 mg via ORAL
  Filled 2013-07-29 (×7): qty 1

## 2013-07-29 MED ORDER — SODIUM CHLORIDE 0.9 % IV SOLN
1.7500 mg/kg/h | INTRAVENOUS | Status: DC
  Administered 2013-07-29: 1.75 mg/kg/h via INTRAVENOUS
  Filled 2013-07-29 (×2): qty 250

## 2013-07-29 MED ORDER — SODIUM CHLORIDE 0.9 % IV SOLN: 250.0000 mL | INTRAVENOUS | Status: DC | PRN

## 2013-07-29 MED ORDER — CLOPIDOGREL BISULFATE 300 MG PO TABS
ORAL_TABLET | ORAL | Status: AC
  Filled 2013-07-29: qty 1

## 2013-07-29 MED ORDER — SODIUM CHLORIDE 0.9 % IV SOLN
0.2500 mg/kg/h | INTRAVENOUS | Status: AC
  Administered 2013-07-29: 0.25 mg/kg/h via INTRAVENOUS
  Filled 2013-07-29: qty 250

## 2013-07-29 MED ORDER — DOPAMINE-DEXTROSE 3.2-5 MG/ML-% IV SOLN
2.0000 ug/kg/min | INTRAVENOUS | Status: DC
  Administered 2013-07-29: 5 ug/kg/min via INTRAVENOUS

## 2013-07-29 MED ORDER — HYDROMORPHONE HCL PF 2 MG/ML IJ SOLN
INTRAMUSCULAR | Status: AC
  Filled 2013-07-29: qty 1

## 2013-07-29 MED ORDER — MORPHINE SULFATE 2 MG/ML IJ SOLN: 2.0000 mg | INTRAMUSCULAR | Status: DC | PRN

## 2013-07-29 MED ORDER — VERAPAMIL HCL 2.5 MG/ML IV SOLN
INTRAVENOUS | Status: AC
  Filled 2013-07-29: qty 2

## 2013-07-29 MED ORDER — SODIUM CHLORIDE 0.9 % IJ SOLN: 3.0000 mL | INTRAMUSCULAR | Status: DC | PRN

## 2013-07-29 MED ORDER — METOPROLOL SUCCINATE ER 25 MG PO TB24
25.0000 mg | ORAL_TABLET | Freq: Every day | ORAL | Status: DC
  Administered 2013-07-30 – 2013-08-01 (×3): 25 mg via ORAL
  Filled 2013-07-29 (×5): qty 1

## 2013-07-29 MED ORDER — FENTANYL CITRATE 0.05 MG/ML IJ SOLN
INTRAMUSCULAR | Status: AC
  Filled 2013-07-29: qty 2

## 2013-07-29 MED ORDER — MIDAZOLAM HCL 2 MG/2ML IJ SOLN
INTRAMUSCULAR | Status: AC
  Filled 2013-07-29: qty 2

## 2013-07-29 MED ORDER — ADULT MULTIVITAMIN W/MINERALS CH
1.0000 | ORAL_TABLET | Freq: Every day | ORAL | Status: DC
  Administered 2013-07-30 – 2013-08-01 (×3): 1 via ORAL
  Filled 2013-07-29 (×5): qty 1

## 2013-07-29 MED ORDER — ALPRAZOLAM 0.25 MG PO TABS
0.1250 mg | ORAL_TABLET | Freq: Every evening | ORAL | Status: DC | PRN
  Administered 2013-07-29 – 2013-08-01 (×4): 0.125 mg via ORAL
  Filled 2013-07-29 (×4): qty 1

## 2013-07-29 MED ORDER — ASPIRIN EC 81 MG PO TBEC
81.0000 mg | DELAYED_RELEASE_TABLET | Freq: Every day | ORAL | Status: DC
  Administered 2013-07-30 – 2013-07-31 (×2): 81 mg via ORAL
  Filled 2013-07-29 (×2): qty 1

## 2013-07-29 MED ORDER — ATORVASTATIN CALCIUM 20 MG PO TABS
20.0000 mg | ORAL_TABLET | Freq: Every day | ORAL | Status: DC
Start: 2013-07-29 — End: 2013-08-02
  Administered 2013-07-30 – 2013-08-01 (×3): 20 mg via ORAL
  Filled 2013-07-29 (×5): qty 1

## 2013-07-29 MED ORDER — POTASSIUM CHLORIDE 20 MEQ/15ML (10%) PO LIQD
40.0000 meq | ORAL | Status: DC
  Filled 2013-07-29 (×2): qty 30

## 2013-07-29 MED ORDER — ATROPINE SULFATE 0.1 MG/ML IJ SOLN
INTRAMUSCULAR | Status: AC
  Filled 2013-07-29: qty 10

## 2013-07-29 NOTE — CV Procedure (Signed)
Cardiac Catheterization Procedure Note  Name: Jonathan Escobar MRN: [id number] DOB: 1946/08/25  Procedure: Left Heart Cath, Selective Coronary Angiography, LV angiography,  LIMA angiography, SVG angiography, PTCA/Stent of the SVG-PDA and PLA, Perclose of the RFA  Indication: Inferior wall STEMI. 67 year-old male with extensive CAD. He has undergone multiple PCI procedures. He ultimately underwent multivessel CABG in 2013 after presenting with stent thrombosis. This morning he developed severe substernal chest pain and called EMS. EKG showed changes consistent with an acute inferior wall STEMI and he was brought directly to the cath lab.  Diagnostic Procedure Details: The right groin was prepped, draped, and anesthetized with 1% lidocaine. Using the modified Seldinger technique, a 6 French sheath was introduced into the right femoral artery. Standard Judkins catheters were used for selective coronary angiography and left ventriculography. Catheter exchanges were performed over a wire.  The diagnostic procedure was well-tolerated without immediate complications.  PROCEDURAL FINDINGS Hemodynamics: AO 112/70  LV 110/13  Coronary angiography: Coronary dominance: right  Left mainstem: 30-40% ostial stenosis. Arises from the left cusp and divides into the LAD and LCx.  Left anterior descending (LAD): Complex 80-90% proximal stenosis with associated aneurysmal segment. Competitive filling of the mid/distal LAD from the LIMA graft.  Left circumflex (LCx): Mild proximal circumflex stenosis. Segmental 50% mid-vessel stenosis. The first and second OM branches are patent with TIMI-3 flow.   Right coronary artery (RCA): Total occlusion in the mid-vessel within the stented segment. TIMI-0 flow beyond the occlusion.  SVG-Diagonal: Widely patent until the distal body of the graft. There is a valve followed by a 95% stenosis at the distal anastomosis.  SVG sequential - PDA and PLA: Severe diffuse 95%  proximal stenosis with subtotal occlusion in the mid-body of the graft. Beyond the subtotal occlusion, there is diffuse narrowing of the vein graft through the distal body of the graft including the distal anastomosis. There is TIMI-2 flow.  LIMA-LAD: patent throughout with an intact distal coronary anastomosis.   Left ventriculography: There is hypokinesis of the basal and mid-inferior walls. The other LV wall segments are normal/hyperdynamic. The LVEF is estimated at 50%, there is no significant mitral regurgitation   PCI Procedure Note:  Following the diagnostic procedure, the decision was made to proceed with PCI. Unfortunately I did not feel there were any good revascularization options. The native RCA is totally occluded in a segment that has undergone extensive stenting, brachytherapy, and re-intervention. The vein graft was extensively diseased throughout it's course. I felt vein graft intervention was most appropriate. Plavix 600 mg was administered. Weight-based bivalirudin was given for anticoagulation. Once a therapeutic ACT was achieved, a 6 [place] RCB guide catheter was inserted.  A cougar coronary guidewire was used to cross the lesion.  The lesion was predilated with a 2.5x15 mm balloon throughout the proximal and mid-body of the graft.  I elected to stent the graft from the ostium through the mid-body with a 3.0x38 Promus Premier DES, hoping that the distal graft would improve after treating the proximal disease. The stent was carefully positioned for full ostial coverage and was deployed at 12 atmospheres then post-dilated to 16 atm. Following stenting, there was diffuse downstream disease of at least 80% stenosis. This was dilated with a 3.0x20 mm balloon and IC verapamil was given. This portion of the graft was treated with overlapping 3.0x38 mm and 3.0x20 mm Promus Premier DES. The stents were deployed at 12 atm and post-dilated to 14 atmospheres over the overlapped segment.   The  wire was  removed and angiography performed. Unfortunately the distal body of the graft appeared more significantly stenosed after stenting. The graft was re-wired with a moderate amount of difficulty and a 4th stent was placed in overlapping fashion (2.75x38 mm Promus DES). The entire stented segment was then post-dilated with a 3.25x30 mm noncompliant balloon to a maximum pressure of 18 atm for a total of 6 inflations. There was residual disease at the distal graft-coronary anastomosis for which I would recommend medical therapy. There was 0% residual stenosis in the stented segment with TIMI-3 flow at the procedure completion. Final angiography confirmed an excellent result. Femoral hemostasis was achieved with a Perclose device.  The patient tolerated the PCI procedure well. There were no immediate procedural complications.  The patient was transferred to the post catheterization recovery area for further monitoring.  PCI Data: Vessel - SVG-PDA and PLA/Segment - proximal/mid/distal body Percent Stenosis (pre)  95 TIMI-flow 2 Stent 3.0x38, 3.0x38, 3.0x20, and 2.75x38 mm Promus DES Percent Stenosis (post) 0 TIMI-flow (post) 3  Final Conclusions:   1. Severe native 2 vessel CAD with severe stenosis of the proximal LAD and total occlusion of the RCA 2. Mild-Moderate stenosis of the left circumflex. 3. S/P CABG with continued patency of the LIMA-LAD, severe stenosis of the SVG-diag, and subtotal occlusion of the sequential SVG - PDA/PLA 4. Successful complex full metal jacket stenting of the SVG-PDA/PLA for treatment of an acute inferior wall STEMI 5. Mild segmental LV systolic dysfunction  Recommendations: Stage PCI of the SVG-diagonal. Indication for staged PCI is severe residual SVG disease with risk of abrupt graft closure. Plavix chosen over ticagrelor/prasugrel because of GI bleed history.  Tonny Bollman 07/29/2013, 4:30 PM

## 2013-07-29 NOTE — H&P (Signed)
Pt. Seen and examined. Agree with the NP/PA-C note as written.  67 yo male patient of Dr. Herbie Baltimore with prior CABG and recurrent ISR to the RCA.  He presented today with inferior MI and had near-total occlusion of the SVG to PDA.  Dr. Excell Seltzer has essentially stented the entire SVG. He has been loaded and started on plavix, due to history of GI bleeding in the past - which occurred on plavix, however, it was thought that more potent anti-platelets would be more risky. There is also a SVG-Diagonal insertion site stenosis which is severe and will require intervention at some point as well.  Chrystie Nose, MD, [facility] Attending Cardiologist [facility]

## 2013-07-29 NOTE — H&P (Signed)
PCP: Bari Edward, MD Cardiologist:  Zebulin Siegel is an 67 y.o. male.   Chief Complaint:  Chest Pain HPI:  Jonathan Escobar is a 67 y.o. male with a PMH of CAD, CABG x4(LIMA-LAD; SEQ SVG-PD-PL; SVG-DIAG.), HTN, HLD, GIB, obesity.  He had a long history of RCA disease dating back to 1999 with PCI to the RCA with a bare-metal stent followed by brachytherapy one year later and redo PCI 2005 DES stent. Upon presentation he was found to have 95% in-stent restenosis of the stented segment followed by an ulnar percent stenosis post-stent. D2 significant bifurcation lesions of the LAD D1.  He had balloon angioplasty of the RCA and was referred for urgent CABG.   He presents today having an inferior STEMI.  He reports developing acute SOB, diaphoresis and palpitations this morning followed by severe chest pain which radiated to his back at ~ 1100hrs.  He took one SL NTG which provided a little relief for a short period.  The patient currently denies nausea, vomiting, fever,  orthopnea, dizziness, PND, cough, congestion, abdominal pain,  melena, lower extremity edema, claudication. He also reports hematochezia which he associates to hemorrhoids.   Medications: Prior to Admission medications   Medication Sig Start Date End Date Taking? Authorizing Provider  ALPRAZolam (XANAX) 0.25 MG tablet Take 0.125 mg by mouth at bedtime as needed for sleep.    Historical Provider, MD  Ascorbic Acid (VITAMIN C) 1000 MG tablet Take 1,000 mg by mouth daily.    Historical Provider, MD  atorvastatin (LIPITOR) 20 MG tablet Take 1 tablet (20 mg total) by mouth daily. 04/26/13   Marykay Lex, MD  metoprolol succinate (TOPROL-XL) 25 MG 24 hr tablet Take 1 tablet (25 mg total) by mouth daily. 04/26/13   Marykay Lex, MD  Multiple Vitamin (MULTIVITAMIN) tablet Take 1 tablet by mouth daily.    Historical Provider, MD  omeprazole (PRILOSEC) 20 MG capsule Take 20 mg by mouth at bedtime.    Historical Provider, MD    Psyllium (METAMUCIL PO) Takes 1 teaspoon of Metamucil daily    Historical Provider, MD     Past Medical History  Diagnosis Date  . CAD S/P percutaneous coronary angioplasty 1999    BMS to prox RCA 1999; Brachytherapy ~2003, followed by DES  PCI ([facility]) for ISR.  Marland Kitchen Coronary stent restenosis due to progression of disease 2000; 2005    (While Still In [place]) Status post Brachytherapy to RCA ISR - 2000; Redo PCI with DES 2005  . History of: ST elevation myocardial infarction (STEMI) of inferior wall, subsequent episode of care April 25, 2012    RCA: 95% ISR followed by 100% stenosis post-stent with significant RPL lesions; LAD: Tandem 80 and 70% lesions involving D1 (1, 1, 1) --> initial angioplasty to RCA aspiration thrombectomy --> urgent CABG  . S/P CABG x 4: LIMA-LAD, seq SVG-PD-PLA; SVG-diag. 04/28/2012  . H/O echocardiogram 08/19/2012    EF 50-55%, moderate HK of anteroseptal wall, septal dyskinesis/dyssynergy due to poststernotomy;  grade 1 diastolic dysfunction. Mildly dilated LA, mildly reduced RV function-likely due to septal dyssynergy.  . Borderline hypertension   . Hyperlipidemia LDL goal <70   . History of GI bleed      while on Plavix  . History of viral meningitis     following GI bleed   . Obesity (BMI 30.0-34.9)   . CAD (coronary artery disease), native coronary artery -- early Mid LAD tandem 70% lesions with intervening ectatsia; extensive  RCA disease with recurrent ISR/thrombosis - not PCI amenable. Plan CABG 04/25/2012    Past Surgical History  Procedure Laterality Date  . Hernia repair      remotely  . Coronary artery bypass graft  04/28/2012    Procedure: CORONARY ARTERY BYPASS GRAFTING (CABG);  Surgeon: Alleen Borne, MD;  Location[facility] OR;  Service: Open Heart Surgery;  Laterality: N/A;    Family History  Problem Relation Age of Onset  . Alzheimer's disease Mother   . Heart failure Father   . Coronary artery disease Father   . Valvular heart disease Sister     Social History:  reports that he quit smoking about 36 years ago. His smoking use included Cigarettes. He has a 10 pack-year smoking history. He has never used smokeless tobacco. He reports that he drinks about 8.4 ounces of alcohol per week. He reports that he does not use illicit drugs.  Allergies:  Allergies  Allergen Reactions  . Ace Inhibitors Other (See Comments)    Cough   . Amoxicillin Rash    Flu like symptoms    Medications Prior to Admission  Medication Sig Dispense Refill  . ALPRAZolam (XANAX) 0.25 MG tablet Take 0.125 mg by mouth at bedtime as needed for sleep.      . Ascorbic Acid (VITAMIN C) 1000 MG tablet Take 1,000 mg by mouth daily.      Marland Kitchen atorvastatin (LIPITOR) 20 MG tablet Take 1 tablet (20 mg total) by mouth daily.  90 tablet  3  . metoprolol succinate (TOPROL-XL) 25 MG 24 hr tablet Take 1 tablet (25 mg total) by mouth daily.  180 tablet  3  . Multiple Vitamin (MULTIVITAMIN) tablet Take 1 tablet by mouth daily.      Marland Kitchen omeprazole (PRILOSEC) 20 MG capsule Take 20 mg by mouth at bedtime.      . Psyllium (METAMUCIL PO) Takes 1 teaspoon of Metamucil daily        No results found for this or any previous visit (from the past 48 hour(s)). No results found.  Review of Systems  Constitutional: Positive for diaphoresis. Negative for fever.  HENT: Negative for congestion and sore throat.   Respiratory: Positive for shortness of breath. Negative for cough.   Cardiovascular: Positive for chest pain. Negative for orthopnea, leg swelling and PND.  Gastrointestinal: Positive for blood in stool (From hemorrhoids). Negative for nausea and vomiting.  Genitourinary: Negative for hematuria.  Musculoskeletal: Positive for back pain (CP radiated to back.).  Neurological: Negative for dizziness.  All other systems reviewed and are negative.    There were no vitals taken for this visit. Physical Exam  Nursing note and vitals reviewed. Constitutional: He is oriented to  person, place, and time. He appears well-developed. No distress.  HENT:  Head: Normocephalic and atraumatic.  Eyes: EOM are normal. Pupils are equal, round, and reactive to light.  Cardiovascular: Normal rate, regular rhythm, S1 normal and S2 normal.   No murmur heard. Pulses:      Radial pulses are 2+ on the right side, and 2+ on the left side.       Dorsalis pedis pulses are 2+ on the right side, and 2+ on the left side.  Respiratory: Effort normal and breath sounds normal. He has no wheezes. He has no rales.  GI: Bowel sounds are normal.  Musculoskeletal: He exhibits no edema.  No LEE   Neurological: He is alert and oriented to person, place, and time.  Skin: Skin is  warm and dry.  Psychiatric: He has a normal mood and affect.     Assessment/Plan Principal Problem:   STEMI (ST elevation myocardial infarction) Active Problems:   CAD (coronary artery disease), native coronary artery -- Mid LAD/D1 tandem 70% lesions; extensive RCA disease with recurrent ISR/thrombosis - not PCI amenable. 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   Borderline hypertension   Hyperlipidemia LDL goal <70  Plan:  The patient was taken emergently to the cath lab.  Hunt Zajicek 07/29/2013, 2:11 PM

## 2013-07-30 DIAGNOSIS — I25810 Atherosclerosis of coronary artery bypass graft(s) without angina pectoris: Secondary | ICD-10-CM

## 2013-07-30 DIAGNOSIS — I517 Cardiomegaly: Secondary | ICD-10-CM

## 2013-07-30 LAB — BASIC METABOLIC PANEL
BUN: 10 mg/dL (ref 6–23)
BUN: 9 mg/dL (ref 6–23)
CO2: 24 mEq/L (ref 19–32)
CO2: 24 mEq/L (ref 19–32)
Calcium: 7.8 mg/dL — ABNORMAL LOW (ref 8.4–10.5)
Calcium: 7.9 mg/dL — ABNORMAL LOW (ref 8.4–10.5)
Creatinine, Ser: 0.83 mg/dL (ref 0.50–1.35)
Creatinine, Ser: 0.82 mg/dL (ref 0.50–1.35)
GFR calc non Af Amer: >90 mL/min (ref >90)
Glucose, Bld: 108 mg/dL — ABNORMAL HIGH (ref 70–99)
Glucose, Bld: 116 mg/dL — ABNORMAL HIGH (ref 70–99)
Sodium: 136 mEq/L (ref 135–145)
Sodium: 137 mEq/L (ref 135–145)

## 2013-07-30 LAB — CBC
HCT: 33.2 % — ABNORMAL LOW (ref 39.0–52.0)
Hemoglobin: 11.7 g/dL — ABNORMAL LOW (ref 13.0–17.0)
Hemoglobin: 11.8 g/dL — ABNORMAL LOW (ref 13.0–17.0)
MCH: 31.7 pg (ref 26.0–34.0)
MCH: 31.1 pg (ref 26.0–34.0)
MCHC: 35.5 g/dL (ref 30.0–36.0)
MCV: 88.6 fL (ref 78.0–100.0)
RBC: 3.69 MIL/uL — ABNORMAL LOW (ref 4.22–5.81)
RDW: 13.4 % (ref 11.5–15.5)

## 2013-07-30 LAB — TYPE AND SCREEN
ABO/RH(D): A POS
Antibody Screen: NEGATIVE

## 2013-07-30 LAB — LIPID PANEL
LDL Cholesterol: 69 mg/dL (ref 0–99)
Total CHOL/HDL Ratio: 3.2 RATIO
Triglycerides: 71 mg/dL (ref <150)
VLDL: 14 mg/dL (ref 0–40)

## 2013-07-30 LAB — TROPONIN I: Troponin I: 8.58 ng/mL (ref <0.30)

## 2013-07-30 LAB — MAGNESIUM: Magnesium: 1.6 mg/dL (ref 1.5–2.5)

## 2013-07-30 LAB — MRSA PCR SCREENING: MRSA by PCR: NEGATIVE

## 2013-07-30 MED ORDER — PANTOPRAZOLE SODIUM 40 MG IV SOLR
40.0000 mg | INTRAVENOUS | Status: DC
  Administered 2013-07-30: 40 mg via INTRAVENOUS
  Filled 2013-07-30 (×2): qty 40

## 2013-07-30 MED ORDER — SODIUM CHLORIDE 0.9 % IV BOLUS (SEPSIS)
1000.0000 mL | Freq: Once | INTRAVENOUS | Status: AC
  Administered 2013-07-30: 1000 mL via INTRAVENOUS

## 2013-07-30 MED ORDER — SODIUM CHLORIDE 0.9 % IV SOLN
INTRAVENOUS | Status: DC
  Administered 2013-07-30: 20 mL/h via INTRAVENOUS

## 2013-07-30 NOTE — Progress Notes (Signed)
Utilization review completed. Elfida Shimada, RN, BSN. 

## 2013-07-30 NOTE — Progress Notes (Signed)
Cross Cover Note  Saw patient serially overnight for hypotension. Was sent from cath lab to ICU on dopamine gtt.   ECG was not overtly c/w IST. Pt also denied any of the presenting sx.   With repeat IV fluid boluses was able to wean from DPA.   Repeat hct demonstrated 5 (or 10) point hct drop depending on if you consider the i-stat lab accurate (it reported hct of 43 yesterday afternoon compared to routine lab with value of 38).   He feels better and is HD stable now. Etiology could be bleed or RV dysfunction in setting of IMI (among other etiologies)   T&S sent.   Consider echo to eval RV function or CT scan of abd to look for bleed if further hemodynamic instability.   Glori Luis, MD

## 2013-07-30 NOTE — Progress Notes (Signed)
Subjective: Doing well.  No CP, SOB, dizziness.  A little sore in the right groin.  Objective: Vital signs in last 24 hours: Temp:  [97.7 F (36.5 C)-98 F (36.7 C)] 97.8 F (36.6 C) (11/29 0400) Pulse Rate:  [61-120] 63 (11/29 0700) Resp:  [19] 19 (11/28 1700) BP: (80-109)/(48-84) 94/69 mmHg (11/29 0700) SpO2:  [92 %-99 %] 97 % (11/29 0700) Weight:  [218 lb 4.1 oz (99 kg)-223 lb 12.3 oz (101.5 kg)] 223 lb 12.3 oz (101.5 kg) (11/29 0500)    Intake/Output from previous day: 11/28 0701 - 11/29 0700 In: 759.4 [I.V.:759.4] Out: -  Intake/Output this shift:    Medications Current Facility-Administered Medications  Medication Dose Route Frequency Provider Last Rate Last Dose  . 0.9 %  sodium chloride infusion  250 mL Intravenous PRN Tonny Bollman, MD      . acetaminophen (TYLENOL) tablet 650 mg  650 mg Oral Q4H PRN Tonny Bollman, MD      . ALPRAZolam Prudy Feeler) tablet 0.125 mg  0.125 mg Oral QHS PRN Tonny Bollman, MD   0.125 mg at 07/29/13 2355  . alum & mag hydroxide-simeth (MAALOX/MYLANTA) 200-200-20 MG/5ML suspension 30 mL  30 mL Oral Q4H PRN Marykay Lex, MD   30 mL at 07/30/13 0547  . aspirin EC tablet 81 mg  81 mg Oral Daily Tonny Bollman, MD      . atorvastatin (LIPITOR) tablet 20 mg  20 mg Oral Daily Tonny Bollman, MD      . clopidogrel (PLAVIX) tablet 75 mg  75 mg Oral Q breakfast Tonny Bollman, MD      . DOPamine (INTROPIN) 800 mg in dextrose 5 % 250 mL infusion  2-20 mcg/kg/min Intravenous Titrated Tonny Bollman, MD   10 mcg/kg/min at 07/29/13 2031  . metoprolol succinate (TOPROL-XL) 24 hr tablet 25 mg  25 mg Oral Daily Tonny Bollman, MD      . morphine 2 MG/ML injection 2 mg  2 mg Intravenous Q1H PRN Tonny Bollman, MD      . multivitamin with minerals tablet 1 tablet  1 tablet Oral Daily Tonny Bollman, MD      . nitroGLYCERIN (NITROSTAT) SL tablet 0.4 mg  0.4 mg Sublingual Q5 Min x 3 PRN Tonny Bollman, MD      . ondansetron [facility]) injection 4 mg  4 mg  Intravenous Q6H PRN Tonny Bollman, MD   4 mg at 07/29/13 2011  . oxyCODONE-acetaminophen (PERCOCET/ROXICET) 5-325 MG per tablet 1-2 tablet  1-2 tablet Oral Q4H PRN Tonny Bollman, MD      . sodium chloride 0.9 % injection 3 mL  3 mL Intravenous Q12H Tonny Bollman, MD   3 mL at 07/30/13 0030  . sodium chloride 0.9 % injection 3 mL  3 mL Intravenous PRN Tonny Bollman, MD        PE: General appearance: alert, cooperative and no distress Lungs: clear to auscultation bilaterally Heart: regular rate and rhythm and S3 present Extremities: Trace LEE Pulses: 2+ and symmetric Skin: Warm and dry.  Mildly tender in right groin.  no hematoma or ecchymosis. Neurologic: Grossly normal  Lab Results:   Recent Labs  07/29/13 1428 07/30/13 0327 07/30/13 0456  WBC 10.3 6.7 6.1  HGB 13.6 11.7* 11.8*  HCT 38.4* 32.7* 33.2*  PLT 239 214 214   BMET  Recent Labs  07/29/13 1428 07/30/13 0327 07/30/13 0456  NA 134* 136 137  K 3.0* 3.7 3.7  CL 99 103 104  CO2 21 24  24  GLUCOSE 117* 108* 116*  BUN 10 10 9   CREATININE 0.67 0.83 0.82  CALCIUM 8.6 7.8* 7.9*   PT/INR  Recent Labs  07/29/13 1428  LABPROT 14.6  INR 1.16   Cholesterol  Recent Labs  07/30/13 0403  CHOL 120   Cardiac Enzymes No components found with this basename: TROPONIN,  CKMB,   Cardiac Panel (last 3 results)  Recent Labs  07/29/13 1428 07/29/13 1755 07/30/13 0002 07/30/13 0327  CKTOTAL 59  --   --   --   CKMB 1.9  --   --   --   TROPONINI <0.30 2.50* 8.58* 12.48*  RELINDX RELATIVE INDEX IS INVALID  --   --   --      Assessment/Plan   Principal Problem:   STEMI (ST elevation myocardial infarction) Active Problems:   CAD (coronary artery disease), native coronary artery -- Mid LAD/D1 tandem 70% lesions; extensive RCA disease with recurrent ISR/thrombosis - not PCI amenable. 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   Borderline hypertension   Hyperlipidemia LDL  goal <70   Acute inferior myocardial infarction  Plan:  SP inferior STEMI.  SVG to PDA stented.  The SVG to diagonal will need to be stented.  ASA, plavix.  Dopamine was weaned off.  He is still hypotensive but asymptomatic.  LVEF ~50%. There is hypokinesis of the basal and mid-inferior walls.  HGB stable.      LOS: 1 day    HAGER, BRYAN 07/30/2013 7:44 AM  I have seen and examined the patient along with Wilburt Finlay, PA.  I have reviewed the chart, notes and new data.  I agree with PA's note.  Key new complaints: Feels back to normal Key examination changes: current BP 120/70, no arrhythmia or signs of HF. Groin ecchymosis, no hematoma Key new findings / data: Trop I peak 12.5 so far, ECG shows complete resolution of ST elevation  PLAN: I think he can go to telemetry. Start very low dose beta blocker. PCI to SVG-Diag on Monday. His GI bleeding was slow, presenting as Fe deficiency anemia while on Plavix and occurred more than 10 years ago. EGD, colonoscopy and capsule enteroscopy were all normal at that time.  Thurmon Fair, MD, [facility] [phone] 07/30/2013, 9:14 AM

## 2013-07-30 NOTE — Progress Notes (Signed)
MD paged regarding pt reaction to dopamine. Dopamine stopped. Bolus administered per MD order. Will continue to monitor.

## 2013-07-30 NOTE — Progress Notes (Signed)
  Echocardiogram 2D Echocardiogram has been performed.  Cyndy Braver FRANCES 07/30/2013, 11:27 AM

## 2013-07-30 NOTE — Progress Notes (Signed)
MD paged regarding pt low BP. Bolus ordered and administered. Will continue to monitor.

## 2013-07-30 NOTE — Progress Notes (Signed)
L AC dopamine infusion site slightly reddened, no evidence of necrosis. Pt denies pain in area.

## 2013-07-30 NOTE — Progress Notes (Signed)
CARDIAC REHAB PHASE I   PRE:  Rate/Rhythm: 71 NSR  BP:  Supine: 104/73 Sitting:   Standing:    SaO2: 100% 3L  MODE:  Ambulation: 350 ft   POST:  Rate/Rhythm: 89 NSR  BP:  Supine:   Sitting: 127/89  Standing:    SaO2: 98% 3L  [id number] Very pleasant, tolerated ambulation well with assist x1, no c/o, VSS. To chair after walk with legs elevated.  Cristy Hilts, MS, ACSM CES

## 2013-07-31 LAB — CBC
MCH: 30.5 pg (ref 26.0–34.0)
MCHC: 34.1 g/dL (ref 30.0–36.0)
MCV: 89.2 fL (ref 78.0–100.0)
Platelets: 218 10*3/uL (ref 150–400)
RDW: 13.6 % (ref 11.5–15.5)

## 2013-07-31 LAB — BASIC METABOLIC PANEL
Calcium: 9.1 mg/dL (ref 8.4–10.5)
Creatinine, Ser: 0.83 mg/dL (ref 0.50–1.35)
GFR calc Af Amer: >90 mL/min (ref >90)
GFR calc non Af Amer: 90 mL/min — ABNORMAL LOW (ref >90)
Sodium: 136 mEq/L (ref 135–145)

## 2013-07-31 MED ORDER — SODIUM CHLORIDE 0.9 % IV SOLN
1.0000 mL/kg/h | INTRAVENOUS | Status: DC
  Administered 2013-08-01: 1 mL/kg/h via INTRAVENOUS

## 2013-07-31 MED ORDER — SODIUM CHLORIDE 0.9 % IJ SOLN
3.0000 mL | Freq: Two times a day (BID) | INTRAMUSCULAR | Status: DC
  Administered 2013-07-31 – 2013-08-01 (×2): 3 mL via INTRAVENOUS

## 2013-07-31 MED ORDER — ASPIRIN EC 81 MG PO TBEC: 81.0000 mg | DELAYED_RELEASE_TABLET | Freq: Every day | ORAL | Status: DC

## 2013-07-31 MED ORDER — SODIUM CHLORIDE 0.9 % IJ SOLN: 3.0000 mL | INTRAMUSCULAR | Status: DC | PRN

## 2013-07-31 MED ORDER — PANTOPRAZOLE SODIUM 40 MG PO TBEC
40.0000 mg | DELAYED_RELEASE_TABLET | Freq: Every day | ORAL | Status: DC
  Administered 2013-07-31 – 2013-08-02 (×3): 40 mg via ORAL
  Filled 2013-07-31 (×3): qty 1

## 2013-07-31 MED ORDER — ASPIRIN 81 MG PO CHEW
81.0000 mg | CHEWABLE_TABLET | ORAL | Status: AC
  Administered 2013-08-01: 81 mg via ORAL
  Filled 2013-07-31: qty 1

## 2013-07-31 MED ORDER — SODIUM CHLORIDE 0.9 % IV SOLN: 250.0000 mL | INTRAVENOUS | Status: DC | PRN

## 2013-07-31 NOTE — Progress Notes (Signed)
Subjective: No complaints.  Objective: Vital signs in last 24 hours: Temp:  [98.3 F (36.8 C)-98.5 F (36.9 C)] 98.3 F (36.8 C) (11/30 0400) Pulse Rate:  [65-89] 77 (11/29 1900) Resp:  [16] 16 (11/30 0400) BP: (90-132)/(48-82) 119/72 mmHg (11/30 0700) SpO2:  [94 %-99 %] 96 % (11/30 0400)    Intake/Output from previous day: 11/29 0701 - 11/30 0700 In: 1160.7 [P.O.:1140; I.V.:20.7] Out: 2651 [Urine:2651] Intake/Output this shift: Total I/O In: 300 [P.O.:300] Out: -   Medications Current Facility-Administered Medications  Medication Dose Route Frequency Provider Last Rate Last Dose  . 0.9 %  sodium chloride infusion  250 mL Intravenous PRN Tonny Bollman, MD      . 0.9 %  sodium chloride infusion   Intravenous Continuous Marykay Lex, MD   20 mL/hr at 07/30/13 [phone]  . acetaminophen (TYLENOL) tablet 650 mg  650 mg Oral Q4H PRN Tonny Bollman, MD      . ALPRAZolam Prudy Feeler) tablet 0.125 mg  0.125 mg Oral QHS PRN Tonny Bollman, MD   0.125 mg at 07/30/13 2251  . alum & mag hydroxide-simeth (MAALOX/MYLANTA) 200-200-20 MG/5ML suspension 30 mL  30 mL Oral Q4H PRN Marykay Lex, MD   30 mL at 07/30/13 0547  . aspirin EC tablet 81 mg  81 mg Oral Daily Tonny Bollman, MD   81 mg at 07/30/13 1004  . atorvastatin (LIPITOR) tablet 20 mg  20 mg Oral Daily Tonny Bollman, MD   20 mg at 07/30/13 1004  . clopidogrel (PLAVIX) tablet 75 mg  75 mg Oral Q breakfast Tonny Bollman, MD   75 mg at 07/31/13 0756  . metoprolol succinate (TOPROL-XL) 24 hr tablet 25 mg  25 mg Oral Daily Tonny Bollman, MD   25 mg at 07/30/13 1004  . morphine 2 MG/ML injection 2 mg  2 mg Intravenous Q1H PRN Tonny Bollman, MD      . multivitamin with minerals tablet 1 tablet  1 tablet Oral Daily Tonny Bollman, MD   1 tablet at 07/30/13 1004  . nitroGLYCERIN (NITROSTAT) SL tablet 0.4 mg  0.4 mg Sublingual Q5 Min x 3 PRN Tonny Bollman, MD      . ondansetron [facility]) injection 4 mg  4 mg Intravenous Q6H PRN Tonny Bollman, MD   4 mg at 07/29/13 2011  . oxyCODONE-acetaminophen (PERCOCET/ROXICET) 5-325 MG per tablet 1-2 tablet  1-2 tablet Oral Q4H PRN Tonny Bollman, MD      . pantoprazole (PROTONIX) injection 40 mg  40 mg Intravenous Q24H Marykay Lex, MD   40 mg at 07/30/13 2014  . sodium chloride 0.9 % injection 3 mL  3 mL Intravenous Q12H Tonny Bollman, MD   3 mL at 07/30/13 2250  . sodium chloride 0.9 % injection 3 mL  3 mL Intravenous PRN Tonny Bollman, MD        PE: General appearance: alert, cooperative and no distress  Lungs: clear to auscultation bilaterally  Heart: regular rate and rhythm and S3 present  Extremities: Trace LEE  Pulses: 2+ and symmetric  Skin: Warm and dry. Mildly tender in right groin. no hematoma or ecchymosis.  Neurologic: Grossly normal   Lab Results:   Recent Labs  07/29/13 1428 07/30/13 0327 07/30/13 0456  WBC 10.3 6.7 6.1  HGB 13.6 11.7* 11.8*  HCT 38.4* 32.7* 33.2*  PLT 239 214 214   BMET  Recent Labs  07/29/13 1428 07/30/13 0327 07/30/13 0456  NA 134* 136 137  K 3.0*  3.7 3.7  CL 99 103 104  CO2 21 24 24   GLUCOSE 117* 108* 116*  BUN 10 10 9   CREATININE 0.67 0.83 0.82  CALCIUM 8.6 7.8* 7.9*   PT/INR  Recent Labs  07/29/13 1428  LABPROT 14.6  INR 1.16   Cholesterol  Recent Labs  07/30/13 0403  CHOL 120   Lipid Panel     Component Value Date/Time   CHOL 120 07/30/2013 0403   TRIG 71 07/30/2013 0403   HDL 37* 07/30/2013 0403   CHOLHDL 3.2 07/30/2013 0403   VLDL 14 07/30/2013 0403   LDLCALC 69 07/30/2013 0403    Assessment/Plan  Principal Problem:   STEMI (ST elevation myocardial infarction) Active Problems:   CAD (coronary artery disease), native coronary artery -- Mid LAD/D1 tandem 70% lesions; extensive RCA disease with recurrent ISR/thrombosis - not PCI amenable. 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   Borderline hypertension   Hyperlipidemia LDL goal <70   Acute inferior  myocardial infarction  Plan:  SP inferior STEMI. SVG to PDA stented. The SVG to diagonal will need to be stented. ASA, plavix.    LVEF ~50% by LV gram. There is hypokinesis of the basal and mid-inferior walls. HGB stable.     BP improved and stable.  Echo completed yesterday.  Staged PCI tomorrow.    LOS: 2 days    HAGER, BRYAN 07/31/2013 8:16 AM  I have seen and examined the patient along with Wilburt Finlay, PA.  I have reviewed the chart, notes and new data.  I agree with PA's note.  PLAN: PCI SVG-Diag in AM   Thurmon Fair, MD, [facility] [phone] 07/31/2013, 8:49 AM

## 2013-08-01 ENCOUNTER — Encounter (HOSPITAL_COMMUNITY)
Admission: EM | Disposition: A | Discharge status: Home / Self Care | Payer: Self-pay | Source: Ambulatory Visit | Attending: Cardiology

## 2013-08-01 HISTORY — PX: PERCUTANEOUS CORONARY STENT INTERVENTION (PCI-S): SHX5485

## 2013-08-01 LAB — BASIC METABOLIC PANEL
BUN: 9 mg/dL (ref 6–23)
CO2: 27 mEq/L (ref 19–32)
Calcium: 9 mg/dL (ref 8.4–10.5)
Chloride: 103 mEq/L (ref 96–112)
Creatinine, Ser: 0.81 mg/dL (ref 0.50–1.35)
GFR calc Af Amer: >90 mL/min (ref >90)
Potassium: 3.6 mEq/L (ref 3.5–5.1)
Sodium: 138 mEq/L (ref 135–145)

## 2013-08-01 LAB — POCT ACTIVATED CLOTTING TIME
Activated Clotting Time: 216 seconds
Activated Clotting Time: 273 seconds

## 2013-08-01 LAB — CBC
HCT: 38.4 % — ABNORMAL LOW (ref 39.0–52.0)
MCV: 88.1 fL (ref 78.0–100.0)
Platelets: 219 10*3/uL (ref 150–400)
RBC: 4.36 MIL/uL (ref 4.22–5.81)
RDW: 13.4 % (ref 11.5–15.5)
WBC: 5.7 10*3/uL (ref 4.0–10.5)

## 2013-08-01 SURGERY — PERCUTANEOUS CORONARY STENT INTERVENTION (PCI-S)
Anesthesia: LOCAL

## 2013-08-01 MED ORDER — MIDAZOLAM HCL 2 MG/2ML IJ SOLN
INTRAMUSCULAR | Status: AC
  Filled 2013-08-01: qty 2

## 2013-08-01 MED ORDER — SODIUM CHLORIDE 0.9 % IV SOLN: 250.0000 mL | INTRAVENOUS | Status: DC | PRN

## 2013-08-01 MED ORDER — SODIUM CHLORIDE 0.9 % IV SOLN: 1.0000 mL/kg/h | INTRAVENOUS | Status: AC

## 2013-08-01 MED ORDER — SODIUM CHLORIDE 0.9 % IJ SOLN: 3.0000 mL | Freq: Two times a day (BID) | INTRAMUSCULAR | Status: DC

## 2013-08-01 MED ORDER — HEPARIN SODIUM (PORCINE) 1000 UNIT/ML IJ SOLN
INTRAMUSCULAR | Status: AC
  Filled 2013-08-01: qty 1

## 2013-08-01 MED ORDER — HEPARIN (PORCINE) IN NACL 2-0.9 UNIT/ML-% IJ SOLN
INTRAMUSCULAR | Status: AC
  Filled 2013-08-01: qty 1000

## 2013-08-01 MED ORDER — VERAPAMIL HCL 2.5 MG/ML IV SOLN
INTRAVENOUS | Status: AC
  Filled 2013-08-01: qty 2

## 2013-08-01 MED ORDER — ACETAMINOPHEN 325 MG PO TABS: 650.0000 mg | ORAL_TABLET | ORAL | Status: DC | PRN

## 2013-08-01 MED ORDER — ASPIRIN 81 MG PO CHEW
81.0000 mg | CHEWABLE_TABLET | Freq: Every day | ORAL | Status: DC
  Administered 2013-08-02: 11:00:00 81 mg via ORAL
  Filled 2013-08-01: qty 1

## 2013-08-01 MED ORDER — LIDOCAINE HCL (PF) 1 % IJ SOLN
INTRAMUSCULAR | Status: AC
  Filled 2013-08-01: qty 30

## 2013-08-01 MED ORDER — ONDANSETRON HCL 4 MG/2ML IJ SOLN: 4.0000 mg | Freq: Four times a day (QID) | INTRAMUSCULAR | Status: DC | PRN

## 2013-08-01 MED ORDER — CLOPIDOGREL BISULFATE 75 MG PO TABS
75.0000 mg | ORAL_TABLET | Freq: Every day | ORAL | Status: DC
  Administered 2013-08-02: 75 mg via ORAL
  Filled 2013-08-01: qty 1

## 2013-08-01 MED ORDER — NITROGLYCERIN 0.2 MG/ML ON CALL CATH LAB
INTRAVENOUS | Status: AC
  Filled 2013-08-01: qty 1

## 2013-08-01 MED ORDER — FENTANYL CITRATE 0.05 MG/ML IJ SOLN
INTRAMUSCULAR | Status: AC
  Filled 2013-08-01: qty 2

## 2013-08-01 MED ORDER — SODIUM CHLORIDE 0.9 % IJ SOLN: 3.0000 mL | INTRAMUSCULAR | Status: DC | PRN

## 2013-08-01 MED FILL — Sodium Chloride IV Soln 0.9%: INTRAVENOUS | Qty: 50 | Status: AC

## 2013-08-01 NOTE — CV Procedure (Signed)
CARDIAC CATHETERIZATION AND PERCUTANEOUS CORONARY INTERVENTION REPORT  NAME:  Jonathan Escobar   MRN: [id number] DOB:  10-03-1945   ADMIT DATE: 07/29/2013 Procedure Date: 08/01/2013  INTERVENTIONAL CARDIOLOGIST: Marykay Lex, M.D., MS PRIMARY CARE PROVIDER: Bari Edward, MD PRIMARY CARDIOLOGIST:  PATIENT:  Jonathan Escobar is a 67 y.o. male with a history of inferior STEMI in August 2013 secondary to in-stent restenosis/thrombosis of RCA stent. He previously had interventions to the RCA with a bare-metal stent that had significant stenosis treated with brachii therapy followed by DES stenting. 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 He was therefore referred for CABG (LIMA-LAD,seq SVG-RPDA-RPL, SVG-Diag). He was doing very well until about a month ago he started noticing increased palpitations, and increased blood pressure. This is usually the prodromal symptoms prior to a cardiac event. He then presented on November 28 with inferior STEMI. Dr. Excell Seltzer performed emergent PCI on a subtotal, extensively diseased SVG-RCA. He also noted significant, 99% subtotal occlusion of the SVG-Diag. He is now referred for staged PCI of the SVG-Diag.  PRE-OPERATIVE DIAGNOSIS:    Known Severe,subtotal occlusion of SVG-Diag in setting of recent Inferior STEMI - extensive occlusion of SVG-RCA with full metal jacket PCI on 11/28.  PROCEDURES PERFORMED:    LEFT HEART CATHETERIZATION WITH GRAFT ANGIOGRAPHY  PERCUTANEOUS CORONARY INTERVENTION OF DISTAL SVG-DIAG (INTO NATIVE DIAG) WITH XIENCE XPEDITION DES 2.5 mm x 23 mm (2.66 mm)  PROCEDURE:Consent:  Risks of procedure as well as the alternatives and risks of each were explained to the (patient/caregiver).  Consent for procedure obtained. Consent for signed by MD and patient with RN witness -- placed on chart.   PROCEDURE: The patient  was brought to the 2nd Floor [facility] in the fasting state and prepped and draped in the usual sterile fashion for Right groin or Left radial access. A modified Allen's test with plethysmography was performed on the Left wrist, revealing excellent Ulnar artery collateral flow.  Sterile technique was used including antiseptics, cap, gloves, gown, hand hygiene, mask and sheet.  Skin prep: Chlorhexidine.  Time Out: Verified patient identification, verified procedure, site/side was marked, verified correct patient position, special equipment/implants available, medications/allergies/relevent history reviewed, required imaging and test results available.  Performed  Access: Left Radial Artery; 6 Fr Sheath -- Seldinger technique (Angiocath Micropuncture Kit)  IA Radial Cocktail - 10 ml, IV Heparin 6000 Units  Diagnostic:   5 Fr JR 4, 6 Fr LCB Guide  SVG-RCA Angiography: 5 Fr JR4  SVG-Diag Angiography: 6Fr LCB Guide   LV Hemodynamics: LCB Guide  TR Band:  1600 Hours, 20 mL air  MEDICATIONS:  Anesthesia:  Local Lidocaine 2 ml  Sedation:  2 mg IV Versed, 50 mcg IV fentanyl ;   Premedication: 75 mg Plavix, 81 mg ASA  Omnipaque Contrast: 150 ml  Anticoagulation:  IV Heparin 6000 Units + 2000 Units (ACT ~300 Sec)  Anti-Platelet Agent:  Standing Plavix + ASA Radial Cocktail: 5 mg Verapamil, 400 mcg NTG, 2 ml 2% Lidocaine in 10 ml NS -- 10 ml IC NTG: 200 mcg x 1  Hemodynamics:  Central Aortic / Mean Pressures: 112/73 mmHg; 90 mmHg  Left Ventricular Pressures / EDP: 110/6 mmHg; 14 mmHg  Coronary Anatomy:  SVG-Diag: Widely patent graft until with a distal valve. Just distal to the valve there is a focal 95-99% stenosis just proximal to the distal  anastomosis.  SeqSVG-PDA and PLA: Extensively stented segment from the ostium down to just beyond the distal bend, with widely patent stents. There only appears to be anastomosis to a branch that appears to be the  PDA. No sequential is noted. There is a size mismatch between the distal graft and the small PDA. Correlate with a 50% narrowing of the graft at the anastomosis. Antegrade flow shows a small PDA, retrograde flow back to the native RCA extends to distal RV marginal branch was runs in tandem with the PDA. Flow then goes into the posterior lateral system where there is a tubular 80% stenosis of the RPL2 just after a smaller RPL 1. There is however TIMI-3 flow to the distal aspect of RPL2.   Percutaneous Coronary Intervention:  Additional 2000 Units IV Heparin given for ACT 206 Sec -- final ACT ~300 Sec.  Lesion: Distal SVG-Diag 95-99%, TIMI 2 flow, but occlusive with balloon. Guide: 6 Fr   LCB Guidewire: BMW Predilation Balloon: Sprinter Legend 2.0 mm x 12 mm; Lesion was occlusive with balloon in place  8 Atm x 30 Sec, 12 Atm x 30 Sec,  200 mcg IC NTG Stent: Xience Xpedition 2.5 mm x 23 mm; from just distal to valve into native Diagonal.  Deployment: 12 Atm x 45 Sec,   Post-dilated with Stent Balloon: 14 Atm x 45 Sec  Final Diameter: 2.66 ml  Post deployment angiography in multiple views, with and without guidewire in place revealed excellent stent deployment and lesion coverage.  There was no evidence of dissection or perforation.  PATIENT DISPOSITION:    The patient was transferred to the PACU holding area in a hemodynamicaly stable, chest pain free condition.  The patient tolerated the procedure well, and there were no complications.  EBL:   < 10 ml  The patient was stable before, during, and after the procedure.  POST-OPERATIVE DIAGNOSIS:    Widely patent stents in SVG-RCA with severe distal RPL disease  Severe, subtotal occlusion of SVG-Diag  Successful PCI of SVG-RCA with single Xience Xpedition DES (2.5 mm x 23 mm --> 2.66 mm) from distal graft into native Diag.  Normal LVEF  PLAN OF CARE:  Standard post Radial Cath care.  Anticipate d/c in AM with close f/u with myself  or Mr Leron Croak, [place].  Anticipate lifelong Plavix given extent of stents in SVG-RCA   HARDING,DAVID W, M.D., M.S. [facility] [address] [place], [place]  [address]  [phone]  08/01/2013 4:06 PM

## 2013-08-01 NOTE — Progress Notes (Signed)
CARDIAC REHAB PHASE I   PRE:  Rate/Rhythm: 61 SR  BP:  Supine: 110/70  Sitting:   Standing:    SaO2: 95 RA  MODE:  Ambulation: 1060 ft   POST:  Rate/Rhythm:    BP:  Supine: 116/70  Sitting:   Standing:    SaO2: 97 RA 0825-900 Pt tolerated ambulation well without c/o of cp or SOB. VS stable. Pt back to bed after walk. We will follow pt tomorrow after PCI. We discussed Outpt. CRP, he agrees to referral at discharge.  Melina Copa RN 08/01/2013 8:57 AM

## 2013-08-01 NOTE — Interval H&P Note (Signed)
History and Physical Interval Note:  08/01/2013 1:37 PM  Ovid Curd  has presented today for surgery, with the diagnosis of STAGED PCI/STEMI.  The various methods of treatment have been discussed with the patient and family. After consideration of risks, benefits and other options for treatment, the patient has consented to  Procedure(s): PERCUTANEOUS CORONARY STENT INTERVENTION (PCI-S) (N/A) as a surgical intervention .  The patient's history has been reviewed, patient examined, no change in status, stable for surgery.  I have reviewed the patient's chart and labs.  Questions were answered to the patient's satisfaction.     Keenya Matera W  Cath Lab Visit (complete for each Cath Lab visit)  Clinical Evaluation Leading to the Procedure:   ACS: no  Non-ACS:    Anginal Classification: CCS III  Anti-ischemic medical therapy: Maximal Therapy (2 or more classes of medications)  Non-Invasive Test Results: No non-invasive testing performed  Prior CABG: Previous CABG

## 2013-08-01 NOTE — Progress Notes (Addendum)
Subjective: No chest pain, no SOB  Objective: Vital signs in last 24 hours: Temp:  [98.1 F (36.7 C)-98.7 F (37.1 C)] 98.6 F (37 C) (12/01 0540) Pulse Rate:  [67-78] 71 (12/01 0540) Resp:  [17-19] 19 (12/01 0540) BP: (104-121)/(63-69) 104/64 mmHg (12/01 0540) SpO2:  [93 %-98 %] 93 % (12/01 0540) Weight:  [215 lb 4.8 oz (97.659 kg)-216 lb 11.2 oz (98.294 kg)] 215 lb 4.8 oz (97.659 kg) (12/01 0540) Weight change:    Intake/Output from previous day: -1057 (-1787 since admit) wt 215 down from 223 pk. 11/30 0701 - 12/01 0700 In: 543 [P.O.:540; I.V.:3] Out: 1600 [Urine:1600] Intake/Output this shift:    PE: General:Pleasant affect, NAD Skin:Warm and dry, brisk capillary refill HEENT:normocephalic, sclera clear, mucus membranes moist Neck:supple, no JVD, no bruits  Heart:S1S2 RRR without murmur, gallup, rub or click Lungs:clear without rales, rhonchi, or wheezes WUJ:WJXB, non tender, + BS, do not palpate liver spleen or masses Ext:no lower ext edema, 2+ pedal pulses, 2+ radial pulses Neuro:alert and oriented, MAE, follows commands, + facial symmetry   Lab Results:  Recent Labs  07/31/13 0900 08/01/13 0533  WBC 5.0 5.7  HGB 12.7* 13.3  HCT 37.2* 38.4*  PLT 218 219   BMET  Recent Labs  07/31/13 0900 08/01/13 0533  NA 136 138  K 4.2 3.6  CL 100 103  CO2 27 27  GLUCOSE 124* 93  BUN 8 9  CREATININE 0.83 0.81  CALCIUM 9.1 9.0    Recent Labs  07/30/13 0002 07/30/13 0327  TROPONINI 8.58* 12.48*    Lab Results  Component Value Date   CHOL 120 07/30/2013   HDL 37* 07/30/2013   LDLCALC 69 07/30/2013   TRIG 71 07/30/2013   CHOLHDL 3.2 07/30/2013   Lab Results  Component Value Date   HGBA1C 6.0* 07/29/2013     Lab Results  Component Value Date   TSH 0.925 04/25/2012    Hepatic Function Panel  Recent Labs  07/29/13 1428  PROT 7.0  ALBUMIN 3.6  AST 24  ALT 31  ALKPHOS 74  BILITOT 0.4    Recent Labs  07/30/13 0403  CHOL 120     No results found for this basename: PROTIME,  in the last 72 hours     Studies/Results: Echo 07/30/13: Left ventricle: The cavity size was normal. Wall thickness was increased in a pattern of mild LVH. Systolic function was normal. The estimated ejection fraction was in the range of 50% to 55%. Mild hypokinesis of the distallateral myocardium. Mild hypokinesis of the basal-midinferolateral and inferior myocardium. Left ventricular diastolic function parameters were normal. - Right ventricle: The cavity size was mildly dilated. Wall thickness was normal. Systolic function was mildly reduced. - Atrial septum: No defect or patent foramen ovale was identified  Medications: I have reviewed the patient's current medications. Scheduled Meds: . [START ON 08/02/2013] aspirin EC  81 mg Oral Daily  . atorvastatin  20 mg Oral Daily  . clopidogrel  75 mg Oral Q breakfast  . metoprolol succinate  25 mg Oral Daily  . multivitamin with minerals  1 tablet Oral Daily  . pantoprazole  40 mg Oral Q0600  . sodium chloride  3 mL Intravenous Q12H  . sodium chloride  3 mL Intravenous Q12H   Continuous Infusions: . sodium chloride Stopped (07/30/13 0900)  . sodium chloride 1 mL/kg/hr (08/01/13 0404)   PRN Meds:.sodium chloride, sodium chloride, acetaminophen, ALPRAZolam, alum & mag hydroxide-simeth, morphine  injection, nitroGLYCERIN, ondansetron (ZOFRAN) IV, oxyCODONE-acetaminophen, sodium chloride, sodium chloride  Assessment/Plan: Principal Problem:   STEMI (ST elevation myocardial infarction), successful complex full metal jacket stenting of the SVG-PDA/PLA -07/29/2013 Active Problems:   CAD (coronary artery disease), native coronary artery -- Mid LAD/D1 tandem 70% lesions; extensive RCA disease with recurrent ISR/thrombosis - not PCI amenable. 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   Borderline hypertension   Hyperlipidemia LDL goal <70   Acute inferior  myocardial infarction  PLAN: Post MI day 3  For staged PCI VG to diag.  After lunch today, ok for cl. BK now. Labs stable, Pk troponin 12.48 on the 29th.  Lipids stable with LDL 69 though HDL 37.   LOS: 3 days   Time spent with pt. :15 minutes. [facility] R  Nurse Practitioner Certified Pager [phone] or after 5pm and on weekends call [phone] 08/01/2013, 7:56 AM  I have seen and evaluated the patient this PM along with Nada Boozer, NP. I agree with her findings, examination as well as impression recommendations.  Overall doing relatively well.  NO further Angina or dyspnea.  Diuresing well.   Mild reduction in EF by Echo - as expected given sizeable infarct.  Plan is staged PCI of anastomotic SVG-Diag lesion..  Performing MD:  Marykay Lex, M.D., M.S.  Procedure:  PCI of SVG-Diag  The procedure with Risks/Benefits/Alternatives and Indications was reviewed with the patient and family.  All questions were answered.   Indication: severe residual SVG disease with risk of abrupt graft closure. Prior to  STEMI Class III Angina.  Appropriateness of USE = Not Rated.  Risks / Complications include, but not limited to: Death, MI, CVA/TIA, VF/VT (with defibrillation), Bradycardia (need for temporary pacer placement), contrast induced nephropathy, bleeding / bruising / hematoma / pseudoaneurysm, vascular or coronary injury (with possible emergent CT or Vascular Surgery), adverse medication reactions, infection.    The patient and family voice understanding and agree to proceed.     Marykay Lex, M.D., M.S. [facility] [address] [place], [place]  [address]  [phone] Pager # [phone] 08/01/2013 1:34 PM

## 2013-08-01 NOTE — Care Management Note (Unsigned)
    Page 1 of 1   08/01/2013     3:46:50 PM   CARE MANAGEMENT NOTE 08/01/2013  Patient:  Jonathan Escobar,Jonathan Escobar   Account Number:  [id number]  Date Initiated:  08/01/2013  Documentation initiated by:  Doniven Vanpatten  Subjective/Objective Assessment:   PT ADM ON 07/29/13 WITH CHEST PAIN, INFERIOR MI.  PTA, PT INDEPENDENT, LIVES WITH SPOUSE.     Action/Plan:   PT FOR CATH, PCI TODAY.  WILL FOLLOW FOR DISCHARGE NEEDS AS PT PROGRESSES.   Anticipated DC Date:  08/03/2013   Anticipated DC Plan:  HOME/SELF CARE      DC Planning Services  CM consult      Choice offered to / List presented to:             Status of service:  In process, will continue to follow Medicare Important Message given?   (If response is "NO", the following Medicare IM given date fields will be blank) Date Medicare IM given:   Date Additional Medicare IM given:    Discharge Disposition:    Per UR Regulation:  Reviewed for med. necessity/level of care/duration of stay  If discussed at Long Length of Stay Meetings, dates discussed:    Comments:

## 2013-08-01 NOTE — H&P (View-Only) (Signed)
Subjective: No chest pain, no SOB  Objective: Vital signs in last 24 hours: Temp:  [98.1 F (36.7 C)-98.7 F (37.1 C)] 98.6 F (37 C) (12/01 0540) Pulse Rate:  [67-78] 71 (12/01 0540) Resp:  [17-19] 19 (12/01 0540) BP: (104-121)/(63-69) 104/64 mmHg (12/01 0540) SpO2:  [93 %-98 %] 93 % (12/01 0540) Weight:  [215 lb 4.8 oz (97.659 kg)-216 lb 11.2 oz (98.294 kg)] 215 lb 4.8 oz (97.659 kg) (12/01 0540) Weight change:    Intake/Output from previous day: -1057 (-1787 since admit) wt 215 down from 223 pk. 11/30 0701 - 12/01 0700 In: 543 [P.O.:540; I.V.:3] Out: 1600 [Urine:1600] Intake/Output this shift:    PE: General:Pleasant affect, NAD Skin:Warm and dry, brisk capillary refill HEENT:normocephalic, sclera clear, mucus membranes moist Neck:supple, no JVD, no bruits  Heart:S1S2 RRR without murmur, gallup, rub or click Lungs:clear without rales, rhonchi, or wheezes ZOX:WRUE, non tender, + BS, do not palpate liver spleen or masses Ext:no lower ext edema, 2+ pedal pulses, 2+ radial pulses Neuro:alert and oriented, MAE, follows commands, + facial symmetry   Lab Results:  Recent Labs  07/31/13 0900 08/01/13 0533  WBC 5.0 5.7  HGB 12.7* 13.3  HCT 37.2* 38.4*  PLT 218 219   BMET  Recent Labs  07/31/13 0900 08/01/13 0533  NA 136 138  K 4.2 3.6  CL 100 103  CO2 27 27  GLUCOSE 124* 93  BUN 8 9  CREATININE 0.83 0.81  CALCIUM 9.1 9.0    Recent Labs  07/30/13 0002 07/30/13 0327  TROPONINI 8.58* 12.48*    Lab Results  Component Value Date   CHOL 120 07/30/2013   HDL 37* 07/30/2013   LDLCALC 69 07/30/2013   TRIG 71 07/30/2013   CHOLHDL 3.2 07/30/2013   Lab Results  Component Value Date   HGBA1C 6.0* 07/29/2013     Lab Results  Component Value Date   TSH 0.925 04/25/2012    Hepatic Function Panel  Recent Labs  07/29/13 1428  PROT 7.0  ALBUMIN 3.6  AST 24  ALT 31  ALKPHOS 74  BILITOT 0.4    Recent Labs  07/30/13 0403  CHOL 120     No results found for this basename: PROTIME,  in the last 72 hours     Studies/Results: Echo 07/30/13: Left ventricle: The cavity size was normal. Wall thickness was increased in a pattern of mild LVH. Systolic function was normal. The estimated ejection fraction was in the range of 50% to 55%. Mild hypokinesis of the distallateral myocardium. Mild hypokinesis of the basal-midinferolateral and inferior myocardium. Left ventricular diastolic function parameters were normal. - Right ventricle: The cavity size was mildly dilated. Wall thickness was normal. Systolic function was mildly reduced. - Atrial septum: No defect or patent foramen ovale was identified  Medications: I have reviewed the patient's current medications. Scheduled Meds: . [START ON 08/02/2013] aspirin EC  81 mg Oral Daily  . atorvastatin  20 mg Oral Daily  . clopidogrel  75 mg Oral Q breakfast  . metoprolol succinate  25 mg Oral Daily  . multivitamin with minerals  1 tablet Oral Daily  . pantoprazole  40 mg Oral Q0600  . sodium chloride  3 mL Intravenous Q12H  . sodium chloride  3 mL Intravenous Q12H   Continuous Infusions: . sodium chloride Stopped (07/30/13 0900)  . sodium chloride 1 mL/kg/hr (08/01/13 0404)   PRN Meds:.sodium chloride, sodium chloride, acetaminophen, ALPRAZolam, alum & mag hydroxide-simeth, morphine  injection, nitroGLYCERIN, ondansetron (ZOFRAN) IV, oxyCODONE-acetaminophen, sodium chloride, sodium chloride  Assessment/Plan: Principal Problem:   STEMI (ST elevation myocardial infarction), successful complex full metal jacket stenting of the SVG-PDA/PLA -07/29/2013 Active Problems:   CAD (coronary artery disease), native coronary artery -- Mid LAD/D1 tandem 70% lesions; extensive RCA disease with recurrent ISR/thrombosis - not PCI amenable. 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   Borderline hypertension   Hyperlipidemia LDL goal <70   Acute inferior  myocardial infarction  PLAN: Post MI day 3  For staged PCI VG to diag.  After lunch today, ok for cl. BK now. Labs stable, Pk troponin 12.48 on the 29th.  Lipids stable with LDL 69 though HDL 37.   LOS: 3 days   Time spent with pt. :15 minutes. [facility] R  Nurse Practitioner Certified Pager [phone] or after 5pm and on weekends call [phone] 08/01/2013, 7:56 AM  I have seen and evaluated the patient this PM along with Nada Boozer, NP. I agree with her findings, examination as well as impression recommendations.  Overall doing relatively well.  NO further Angina or dyspnea.  Diuresing well.   Mild reduction in EF by Echo - as expected given sizeable infarct.  Plan is staged PCI of anastomotic SVG-Diag lesion..  Performing MD:  Marykay Lex, M.D., M.S.  Procedure:  PCI of SVG-Diag  The procedure with Risks/Benefits/Alternatives and Indications was reviewed with the patient and family.  All questions were answered.    Risks / Complications include, but not limited to: Death, MI, CVA/TIA, VF/VT (with defibrillation), Bradycardia (need for temporary pacer placement), contrast induced nephropathy, bleeding / bruising / hematoma / pseudoaneurysm, vascular or coronary injury (with possible emergent CT or Vascular Surgery), adverse medication reactions, infection.    The patient and family voice understanding and agree to proceed.     Marykay Lex, M.D., M.S. [facility] [address] [place], [place]  [address]  [phone] Pager # [phone] 08/01/2013 1:34 PM

## 2013-08-01 NOTE — Progress Notes (Addendum)
TR BAND REMOVAL  LOCATION:    right radial  DEFLATED PER PROTOCOL:    yes  TIME BAND OFF / DRESSING APPLIED:    20:45   SITE UPON ARRIVAL:    Level 0  SITE AFTER BAND REMOVAL:    Level 0  REVERSE ALLEN'S TEST:     positive  CIRCULATION SENSATION AND MOVEMENT:    Within Normal Limits   yes  COMMENTS:    

## 2013-08-02 DIAGNOSIS — I2119 ST elevation (STEMI) myocardial infarction involving other coronary artery of inferior wall: Secondary | ICD-10-CM

## 2013-08-02 DIAGNOSIS — R030 Elevated blood-pressure reading, without diagnosis of hypertension: Secondary | ICD-10-CM

## 2013-08-02 LAB — CBC
HCT: 40.3 % (ref 39.0–52.0)
Hemoglobin: 13.8 g/dL (ref 13.0–17.0)
Platelets: 229 10*3/uL (ref 150–400)
RBC: 4.49 MIL/uL (ref 4.22–5.81)
RDW: 13.4 % (ref 11.5–15.5)
WBC: 6.3 10*3/uL (ref 4.0–10.5)

## 2013-08-02 LAB — BASIC METABOLIC PANEL
BUN: 10 mg/dL (ref 6–23)
CO2: 26 mEq/L (ref 19–32)
Chloride: 101 mEq/L (ref 96–112)
GFR calc Af Amer: >90 mL/min (ref >90)
Potassium: 4 mEq/L (ref 3.5–5.1)

## 2013-08-02 MED ORDER — PANTOPRAZOLE SODIUM 40 MG PO TBEC: 40.0000 mg | DELAYED_RELEASE_TABLET | Freq: Every day | ORAL | Status: AC

## 2013-08-02 MED ORDER — CLOPIDOGREL BISULFATE 75 MG PO TABS: 75.0000 mg | ORAL_TABLET | Freq: Every day | ORAL | Status: DC

## 2013-08-02 MED ORDER — ACETAMINOPHEN 325 MG PO TABS: 650.0000 mg | ORAL_TABLET | ORAL | Status: AC | PRN

## 2013-08-02 MED ORDER — VALSARTAN 80 MG PO TABS: 80.0000 mg | ORAL_TABLET | Freq: Every day | ORAL | Status: DC

## 2013-08-02 MED ORDER — NITROGLYCERIN 0.4 MG SL SUBL: 0.4000 mg | SUBLINGUAL_TABLET | SUBLINGUAL | Status: DC | PRN

## 2013-08-02 NOTE — Discharge Summary (Signed)
Patient ID: Jonathan Escobar,  MRN: [id number], DOB/AGE: [id number] y.o.  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   H/O: GI bleed - 2007   Borderline hypertension   Hyperlipidemia LDL goal <70    Procedures: Urgent Cath/ PCI SVG-PDA/PLA 07/29/13                        Staged PCI to SVG-Dx 08/01/13   Hospital Course:  Jonathan Escobar is a 67 y.o. male with a PMH of CAD, HTN, HLD, GIB in 2007, obesity. He has a long history of RCA disease dating back to 1999 with PCI to the RCA with a bare-metal stent followed by brachytherapy one year later and redo PCI 2005 with a DES stent. He had ISR as well as progression of CAD Aug 2013 and ultimately had CABG x4(LIMA-LAD; SEQ SVG-PD-PL; SVG-DIAG.). He presented 07/29/13 with a STEMI and underwent complex full metal jacket stenting of the SVG- PD/PL. He tolerated this well. He had a staged PCI with DES to the SVG-Dx on 08/01/13. Echo on 07/30/13 showed an EF of 50-55% with mild LVH. Dr Allyson Sabal feels he can be discharged 08/02/13 and follow up with Wilburt Finlay in one week, then Dr Herbie Baltimore in 6 weeks. At discharge Dr Allyson Sabal indicated he felt Mr Brueggemann would require DAPT for life in light of the exten of the SVG-RCA stenting. He had a history of a GIB in 2007 and we changed his PPI to Protonix which he will need to remain on as well. At discharge the pt's B/P is elevated. I suggested resuming Norvasc but the pt says he did not tolerate this well. He requested Diovan which he tolerated in the past (stopped because his B/P came down).    Discharge Vitals:  Blood pressure 113/81, pulse 70, temperature 97.9 F (36.6 C), temperature source Oral, resp.  rate 18, height 5\' 10"  (1.778 m), weight 216 lb 7.9 oz (98.2 kg), SpO2 93.00%.    Labs: Results for orders placed during the hospital encounter of 07/29/13 (from the past 48 hour(s))  CBC     Status: Abnormal   Collection Time    08/01/13  5:33 AM      Result Value Range   WBC 5.7  4.0 - 10.5 K/uL   RBC 4.36  4.22 - 5.81 MIL/uL   Hemoglobin 13.3  13.0 - 17.0 g/dL   HCT 69.6 (*) 29.5 - 28.4 %   MCV 88.1  78.0 - 100.0 fL   MCH 30.5  26.0 - 34.0 pg   MCHC 34.6  30.0 - 36.0 g/dL   RDW 13.2  44.0 - 10.2 %   Platelets 219  150 - 400 K/uL  BASIC METABOLIC PANEL     Status: None   Collection Time    08/01/13  5:33 AM      Result Value Range   Sodium 138  135 - 145 mEq/L   Potassium 3.6  3.5 - 5.1 mEq/L   Chloride 103  96 - 112 mEq/L   CO2 27  19 - 32 mEq/L   Glucose, Bld 93  70 - 99 mg/dL   BUN 9  6 - 23 mg/dL   Creatinine, Ser 7.25  0.50 - 1.35 mg/dL   Calcium 9.0  8.4 - 44.0 mg/dL   GFR calc non Af Amer >90  >90 mL/min   GFR calc Af Amer >90  >90 mL/min   Comment: (NOTE)     The eGFR has been calculated using the CKD EPI equation.     This calculation has not been validated in all clinical situations.     eGFR's persistently <90 mL/min signify possible Chronic Kidney     Disease.  POCT ACTIVATED CLOTTING TIME     Status: None   Collection Time    08/01/13  3:31 PM      Result Value Range   Activated Clotting Time 216    POCT ACTIVATED CLOTTING TIME     Status: None   Collection Time    08/01/13  3:44 PM      Result Value Range   Activated Clotting Time 273    CBC     Status: None   Collection Time    08/02/13  5:15 AM      Result Value Range   WBC 6.3  4.0 - 10.5 K/uL   RBC 4.49  4.22 - 5.81 MIL/uL   Hemoglobin 13.8  13.0 - 17.0 g/dL   HCT 10.2  72.5 - 36.6 %   MCV 89.8  78.0 - 100.0 fL   MCH 30.7  26.0 - 34.0 pg   MCHC 34.2  30.0 - 36.0 g/dL   RDW 44.0  34.7 - 42.5 %   Platelets 229  150 - 400 K/uL  BASIC METABOLIC PANEL     Status: Abnormal   Collection Time     08/02/13  5:15 AM      Result Value Range   Sodium 136  135 - 145 mEq/L   Potassium 4.0  3.5 - 5.1 mEq/L   Chloride 101  96 - 112 mEq/L   CO2 26  19 - 32 mEq/L   Glucose, Bld 83  70 - 99 mg/dL   BUN 10  6 - 23 mg/dL   Creatinine, Ser 9.56  0.50 - 1.35 mg/dL   Calcium 9.0  8.4 - 38.7 mg/dL   GFR calc non Af Amer 88 (*) >90 mL/min   GFR calc Af Amer >90  >90 mL/min   Comment: (NOTE)     The eGFR has been calculated using the CKD EPI equation.     This calculation has not been validated in all clinical situations.     eGFR's persistently <90 mL/min signify possible Chronic Kidney     Disease.    Disposition:      Follow-up Information   Follow up with Wilburt Finlay, PA-C On 08/09/2013. (1:40pm)    Specialty:  Physician Assistant   Contact information:   [address] [place] [address] [phone]       Discharge Medications:    Medication List    STOP taking these medications       amLODipine 5 MG tablet  Commonly known as:  NORVASC     omeprazole 20 MG capsule  Commonly known as:  PRILOSEC      TAKE these medications       acetaminophen 325 MG tablet  Commonly known as:  TYLENOL  Take 2 tablets (650 mg total) by mouth every 4 (four) hours as needed for headache or mild pain.     ALPRAZolam 0.25 MG tablet  Commonly known as:  XANAX  Take 0.125 mg by mouth at  bedtime as needed for sleep.     aspirin 81 MG tablet  Take 81 mg by mouth daily.     atorvastatin 20 MG tablet  Commonly known as:  LIPITOR  Take 1 tablet (20 mg total) by mouth daily.     cetirizine 10 MG tablet  Commonly known as:  ZYRTEC  Take 10 mg by mouth daily.     clopidogrel 75 MG tablet  Commonly known as:  PLAVIX  Take 1 tablet (75 mg total) by mouth daily with breakfast.     ibuprofen 200 MG tablet  Commonly known as:  ADVIL,MOTRIN  Take 200 mg by mouth every 6 (six) hours as needed.     METAMUCIL PO  Takes 1 teaspoon of Metamucil daily     metoprolol  succinate 25 MG 24 hr tablet  Commonly known as:  TOPROL-XL  Take 1 tablet (25 mg total) by mouth daily.     multivitamin tablet  Take 1 tablet by mouth daily.     nitroGLYCERIN 0.4 MG SL tablet  Commonly known as:  NITROSTAT  Place 1 tablet (0.4 mg total) under the tongue every 5 (five) minutes x 3 doses as needed for chest pain.     pantoprazole 40 MG tablet  Commonly known as:  PROTONIX  Take 1 tablet (40 mg total) by mouth daily at 6 (six) AM.     vitamin C 1000 MG tablet  Take 1,000 mg by mouth daily.         Duration of Discharge Encounter: Greater than 30 minutes including physician time.  Jolene Provost PA-C 08/02/2013 9:59 AM

## 2013-08-02 NOTE — Progress Notes (Signed)
CARDIAC REHAB PHASE I     MODE:  Ambulation: 1000 ft   POST:  Rate/Rhythm: 82 SR    BP: sitting 151/95, 134/90 after 20 min    SaO2:   Pt walking independently. Feels good. BP elevated. Receptive to ed, knows he needs to work on his diet and ex. Requests his [facility] be [facility] to [facility]. [phone]   Elissa Lovett [place] CES, ACSM 08/02/2013 10:14 AM

## 2013-08-02 NOTE — Progress Notes (Signed)
Subjective:  No CP/SOB S/P Diag SVG PCI/Stent with DES  Objective:  Temp:  [97.3 F (36.3 C)-98.4 F (36.9 C)] 97.9 F (36.6 C) (12/02 0821) Pulse Rate:  [51-71] 70 (12/02 0821) Resp:  [18-20] 18 (12/02 0821) BP: (107-132)/(39-92) 113/81 mmHg (12/02 0821) SpO2:  [93 %-98 %] 93 % (12/02 0549) Weight:  [216 lb 7.9 oz (98.2 kg)] 216 lb 7.9 oz (98.2 kg) (12/02 0100) Weight change: -3.3 oz (-0.094 kg)  Intake/Output from previous day: 12/01 0701 - 12/02 0700 In: 560 [P.O.:360; I.V.:200] Out: 2101 [Urine:2100; Stool:1]  Intake/Output from this shift:    Physical Exam: General appearance: alert and no distress Neck: no adenopathy, no carotid bruit, no JVD, supple, symmetrical, trachea midline and thyroid not enlarged, symmetric, no tenderness/mass/nodules Lungs: clear to auscultation bilaterally Heart: regular rate and rhythm, S1, S2 normal, no murmur, click, rub or gallop Extremities: extremities normal, atraumatic, no cyanosis or edema and Right and left radial access sites OK  Lab Results: Results for orders placed during the hospital encounter of 07/29/13 (from the past 48 hour(s))  CBC     Status: Abnormal   Collection Time    07/31/13  9:00 AM      Result Value Range   WBC 5.0  4.0 - 10.5 K/uL   RBC 4.17 (*) 4.22 - 5.81 MIL/uL   Hemoglobin 12.7 (*) 13.0 - 17.0 g/dL   HCT 16.1 (*) 09.6 - 04.5 %   MCV 89.2  78.0 - 100.0 fL   MCH 30.5  26.0 - 34.0 pg   MCHC 34.1  30.0 - 36.0 g/dL   RDW 40.9  81.1 - 91.4 %   Platelets 218  150 - 400 K/uL  BASIC METABOLIC PANEL     Status: Abnormal   Collection Time    07/31/13  9:00 AM      Result Value Range   Sodium 136  135 - 145 mEq/L   Potassium 4.2  3.5 - 5.1 mEq/L   Chloride 100  96 - 112 mEq/L   CO2 27  19 - 32 mEq/L   Glucose, Bld 124 (*) 70 - 99 mg/dL   BUN 8  6 - 23 mg/dL   Creatinine, Ser 7.82  0.50 - 1.35 mg/dL   Calcium 9.1  8.4 - 95.6 mg/dL   GFR calc non Af Amer 90 (*) >90 mL/min   GFR calc Af Amer >90   >90 mL/min   Comment: (NOTE)     The eGFR has been calculated using the CKD EPI equation.     This calculation has not been validated in all clinical situations.     eGFR's persistently <90 mL/min signify possible Chronic Kidney     Disease.  CBC     Status: Abnormal   Collection Time    08/01/13  5:33 AM      Result Value Range   WBC 5.7  4.0 - 10.5 K/uL   RBC 4.36  4.22 - 5.81 MIL/uL   Hemoglobin 13.3  13.0 - 17.0 g/dL   HCT 21.3 (*) 08.6 - 57.8 %   MCV 88.1  78.0 - 100.0 fL   MCH 30.5  26.0 - 34.0 pg   MCHC 34.6  30.0 - 36.0 g/dL   RDW 46.9  62.9 - 52.8 %   Platelets 219  150 - 400 K/uL  BASIC METABOLIC PANEL     Status: None   Collection Time    08/01/13  5:33 AM  Result Value Range   Sodium 138  135 - 145 mEq/L   Potassium 3.6  3.5 - 5.1 mEq/L   Chloride 103  96 - 112 mEq/L   CO2 27  19 - 32 mEq/L   Glucose, Bld 93  70 - 99 mg/dL   BUN 9  6 - 23 mg/dL   Creatinine, Ser 1.61  0.50 - 1.35 mg/dL   Calcium 9.0  8.4 - 09.6 mg/dL   GFR calc non Af Amer >90  >90 mL/min   GFR calc Af Amer >90  >90 mL/min   Comment: (NOTE)     The eGFR has been calculated using the CKD EPI equation.     This calculation has not been validated in all clinical situations.     eGFR's persistently <90 mL/min signify possible Chronic Kidney     Disease.  POCT ACTIVATED CLOTTING TIME     Status: None   Collection Time    08/01/13  3:31 PM      Result Value Range   Activated Clotting Time 216    POCT ACTIVATED CLOTTING TIME     Status: None   Collection Time    08/01/13  3:44 PM      Result Value Range   Activated Clotting Time 273    CBC     Status: None   Collection Time    08/02/13  5:15 AM      Result Value Range   WBC 6.3  4.0 - 10.5 K/uL   RBC 4.49  4.22 - 5.81 MIL/uL   Hemoglobin 13.8  13.0 - 17.0 g/dL   HCT 04.5  40.9 - 81.1 %   MCV 89.8  78.0 - 100.0 fL   MCH 30.7  26.0 - 34.0 pg   MCHC 34.2  30.0 - 36.0 g/dL   RDW 91.4  78.2 - 95.6 %   Platelets 229  150 - 400 K/uL    BASIC METABOLIC PANEL     Status: Abnormal   Collection Time    08/02/13  5:15 AM      Result Value Range   Sodium 136  135 - 145 mEq/L   Potassium 4.0  3.5 - 5.1 mEq/L   Chloride 101  96 - 112 mEq/L   CO2 26  19 - 32 mEq/L   Glucose, Bld 83  70 - 99 mg/dL   BUN 10  6 - 23 mg/dL   Creatinine, Ser 2.13  0.50 - 1.35 mg/dL   Calcium 9.0  8.4 - 08.6 mg/dL   GFR calc non Af Amer 88 (*) >90 mL/min   GFR calc Af Amer >90  >90 mL/min   Comment: (NOTE)     The eGFR has been calculated using the CKD EPI equation.     This calculation has not been validated in all clinical situations.     eGFR's persistently <90 mL/min signify possible Chronic Kidney     Disease.    Imaging: Imaging results have been reviewed  Assessment/Plan:   1. Principal Problem: 2.   STEMI (ST elevation myocardial infarction), successful complex full metal jacket stenting of the SVG-PDA/PLA -07/29/2013 3. Active Problems: 4.   CAD (coronary artery disease), native coronary artery -- Mid LAD/D1 tandem 70% lesions; extensive RCA disease with recurrent ISR/thrombosis - not PCI amenable. PTCA--> CABGx4 5.   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 6.   Borderline hypertension 7.   Hyperlipidemia LDL goal <70 8.   Acute inferior  myocardial infarction 9.   Time Spent Directly with Patient:  25 minutes  Length of Stay:  LOS: 4 days   S/P inf STEMI Rx with PCI and extensive stenting of sub totalled SVG to [facility] RCA with full metal jacket DES by Dr. Excell Seltzer with staged intervention of SVG to Diag by Dr. [facility]. Looks great!!! No CP/SOB. Exam benign. Labs OK. D/C home on life long DAPT. ROV with Wilburt Finlay PAC then [facility].   Jonathan Escobar 08/02/2013, 8:38 AM

## 2013-08-02 NOTE — Progress Notes (Signed)
Came to visit Mr Pretlow at bedside on behalf of [employer] to [employer] program for [employer] employees/dependents with [employer]. Patient had been discharged prior to visit. Will follow up with post hospital discharge call.  Raiford Noble, MSN-Ed, RN,BSN- [facility] Liaison231 [phone]

## 2013-08-08 ENCOUNTER — Other Ambulatory Visit: Payer: Self-pay | Admitting: *Deleted

## 2013-08-08 DIAGNOSIS — Z951 Presence of aortocoronary bypass graft: Secondary | ICD-10-CM

## 2013-08-08 DIAGNOSIS — I2510 Atherosclerotic heart disease of native coronary artery without angina pectoris: Secondary | ICD-10-CM

## 2013-08-08 MED ORDER — METOPROLOL SUCCINATE ER 25 MG PO TB24: 25.0000 mg | ORAL_TABLET | Freq: Every day | ORAL | Status: DC

## 2013-08-08 NOTE — Telephone Encounter (Signed)
Refilled metoprolol succ. To [facility].

## 2013-08-09 ENCOUNTER — Encounter: Payer: Self-pay | Admitting: Physician Assistant

## 2013-08-09 ENCOUNTER — Ambulatory Visit (INDEPENDENT_AMBULATORY_CARE_PROVIDER_SITE_OTHER): Payer: 59 | Admitting: Physician Assistant

## 2013-08-09 VITALS — BP 126/80 | HR 78 | Ht 69.0 in | Wt 221.9 lb

## 2013-08-09 DIAGNOSIS — I2510 Atherosclerotic heart disease of native coronary artery without angina pectoris: Secondary | ICD-10-CM

## 2013-08-09 DIAGNOSIS — I213 ST elevation (STEMI) myocardial infarction of unspecified site: Secondary | ICD-10-CM

## 2013-08-09 DIAGNOSIS — R030 Elevated blood-pressure reading, without diagnosis of hypertension: Secondary | ICD-10-CM

## 2013-08-09 DIAGNOSIS — E669 Obesity, unspecified: Secondary | ICD-10-CM

## 2013-08-09 DIAGNOSIS — I219 Acute myocardial infarction, unspecified: Secondary | ICD-10-CM

## 2013-08-09 MED ORDER — METOPROLOL SUCCINATE ER 25 MG PO TB24: 37.5000 mg | ORAL_TABLET | Freq: Every day | ORAL | Status: DC

## 2013-08-09 NOTE — Patient Instructions (Addendum)
1.  Cardiac rehab. 2.  Lifestyle changes:  Call [name] to schedule an appt.  Very important! 3.  Follow up in one month with Dr. Herbie Baltimore  Patient request a copy of H&P,CATH (2),ECHO,DISCHARGE SUMMARY ---GIVEN TO HIM Jonathan Escobar ,RN)

## 2013-08-09 NOTE — Progress Notes (Signed)
Date:  08/09/2013   ID:  Jonathan Escobar, DOB 1946-04-04, MRN [id number]  PCP:  Bari Edward, MD  Primary Cardiologist:  Herbie Baltimore     History of Present Illness: Jonathan Escobar is a 67 y.o. male with a PMH of CAD, HTN, HLD, GIB in 2007, obesity. He has a long history of RCA disease dating back to 1999 with PCI to the RCA with a bare-metal stent followed by brachytherapy one year later and redo PCI 2005 with a DES stent. He had ISR as well as progression of CAD Aug 2013 and ultimately had CABG x4(LIMA-LAD; SEQ SVG-PD-PL; SVG-DIAG.). He presented 07/29/13 with a STEMI and underwent complex full metal jacket stenting of the SVG- PD/PL. He tolerated this well. He had a staged PCI with DES to the SVG-Dx on 08/01/13. Echo on 07/30/13 showed an EF of 50-55% with mild LVH.   Dr Allyson Sabal indicated he felt Jonathan Escobar would require DAPT for life in light of the exten of the SVG-RCA stenting. He had a history of a GIB in 2007 and we changed his PPI to Protonix which he will need to remain on as well.   The patient presents today for follow up. He is doing well and scheduled to start cardiac rehab.  He and his wife wife stopped at [employer] prior to coming in.  I did discuss watching his sodium intake!  He going back to work on September 13, 2013.  He reports getting a little SOB with exertion; has had some palpitations and a little dizziness.  He increased his Toprol XL from 25 to 37.5mg  and that seems to have helped.  OTher than that he denies nausea, vomiting, fever, chest pain, orthopnea, dizziness, PND, cough, congestion, abdominal pain, hematochezia, melena, lower extremity edema, claudication.  Wt Readings from Last 3 Encounters:  08/09/13 221 lb 14.4 oz (100.653 kg)  08/02/13 216 lb 7.9 oz (98.2 kg)  08/02/13 216 lb 7.9 oz (98.2 kg)     Past Medical History  Diagnosis Date  . CAD S/P percutaneous coronary angioplasty 1999    BMS to prox RCA 1999; Brachytherapy ~2003, followed by DES  PCI ([facility]) for  ISR.  Marland Kitchen Coronary stent restenosis due to progression of disease 2000; 2005    (While Still In [place]) Status post Brachytherapy to RCA ISR - 2000; Redo PCI with DES 2005  . History of: ST elevation myocardial infarction (STEMI) of inferior wall, subsequent episode of care April 25, 2012    RCA: 95% ISR followed by 100% stenosis post-stent with significant RPL lesions; LAD: Tandem 80 and 70% lesions involving D1 (1, 1, 1) --> initial angioplasty to RCA aspiration thrombectomy --> urgent CABG  . S/P CABG x 4: LIMA-LAD, seq SVG-PD-PLA; SVG-diag. 04/28/2012  . H/O echocardiogram 08/19/2012    EF 50-55%, moderate HK of anteroseptal wall, septal dyskinesis/dyssynergy due to poststernotomy;  grade 1 diastolic dysfunction. Mildly dilated LA, mildly reduced RV function-likely due to septal dyssynergy.  . Borderline hypertension   . Hyperlipidemia LDL goal <70   . History of GI bleed      while on Plavix  . History of viral meningitis     following GI bleed   . Obesity (BMI 30.0-34.9)   . CAD (coronary artery disease), native coronary artery -- early Mid LAD tandem 70% lesions with intervening ectatsia; extensive RCA disease with recurrent ISR/thrombosis - not PCI amenable. Plan CABG 04/25/2012    Current Outpatient Prescriptions  Medication Sig Dispense Refill  . acetaminophen (  TYLENOL) 325 MG tablet Take 2 tablets (650 mg total) by mouth every 4 (four) hours as needed for headache or mild pain.      Marland Kitchen ALPRAZolam (XANAX) 0.25 MG tablet Take 0.125 mg by mouth at bedtime as needed for sleep.      . Ascorbic Acid (VITAMIN C) 1000 MG tablet Take 1,000 mg by mouth daily.      Marland Kitchen aspirin 81 MG tablet Take 81 mg by mouth daily.      Marland Kitchen atorvastatin (LIPITOR) 20 MG tablet Take 1 tablet (20 mg total) by mouth daily.  90 tablet  3  . cetirizine (ZYRTEC) 10 MG tablet Take 10 mg by mouth daily.      . clopidogrel (PLAVIX) 75 MG tablet Take 1 tablet (75 mg total) by mouth daily with breakfast.  90 tablet  3  .  ibuprofen (ADVIL,MOTRIN) 200 MG tablet Take 200 mg by mouth every 6 (six) hours as needed.      . metoprolol succinate (TOPROL-XL) 25 MG 24 hr tablet Take 1.5 tablets (37.5 mg total) by mouth daily.  90 tablet  3  . Multiple Vitamin (MULTIVITAMIN) tablet Take 1 tablet by mouth daily.      . nitroGLYCERIN (NITROSTAT) 0.4 MG SL tablet Place 1 tablet (0.4 mg total) under the tongue every 5 (five) minutes x 3 doses as needed for chest pain.  25 tablet  3  . pantoprazole (PROTONIX) 40 MG tablet Take 1 tablet (40 mg total) by mouth daily at 6 (six) AM.  90 tablet  3  . Psyllium (METAMUCIL PO) Takes 1 teaspoon of Metamucil daily      . valsartan (DIOVAN) 80 MG tablet Take 1 tablet (80 mg total) by mouth daily.  30 tablet  5   No current facility-administered medications for this visit.    Allergies:    Allergies  Allergen Reactions  . Ace Inhibitors Other (See Comments)    Cough   . Norvasc [Amlodipine Besylate]     Dizzy  . Amoxicillin Rash    Flu like symptoms    Social History:  The patient  reports that he quit smoking about 36 years ago. His smoking use included Cigarettes. He has a 10 pack-year smoking history. He has never used smokeless tobacco. He reports that he drinks about 8.4 ounces of alcohol per week. He reports that he does not use illicit drugs.   Family history:   Family History  Problem Relation Age of Onset  . Alzheimer's disease Mother   . Heart failure Father   . Coronary artery disease Father   . Valvular heart disease Sister     ROS:  Please see the history of present illness.  All other systems reviewed and negative.   PHYSICAL EXAM: VS:  BP 126/80  Pulse 78  Ht 5\' 9"  (1.753 m)  Wt 221 lb 14.4 oz (100.653 kg)  BMI 32.75 kg/m2 Obese, well developed, in no acute distress HEENT: Pupils are equal round react to light accommodation extraocular movements are intact.  Neck: no JVDNo cervical lymphadenopathy. Cardiac: Regular rate and rhythm without murmurs rubs  or gallops. Lungs:  clear to auscultation bilaterally, no wheezing, rhonchi or rales Abd: soft, nontender, positive bowel sounds all quadrants, no hepatosplenomegaly Ext: no lower extremity edema.  2+ radial and dorsalis pedis pulses. Skin: warm and dry Neuro:  Grossly normal  EKG:  NSR, 78 BPM LAD  ASSESSMENT AND PLAN:  Problem List Items Addressed This Visit   Borderline hypertension (  Chronic)     BP controlled today. Patient increased his own Toprol XL to 37.5mg  daily.  Ok.    STEMI 07/29/13- successful complex full metal jacket stenting of the SVG-PDA/PLA -07/29/2013, staged SVG-Dx PCI 12/01     Doing well.  On ASA and plavix.  Cardiac rehab to commence.      Relevant Medications      metoprolol succinate (TOPROL-XL) 24 hr tablet   Obesity (BMI 30-39.9)    Other Visit Diagnoses   CAD (coronary artery disease)    -  Primary    Relevant Orders       EKG 12-Lead

## 2013-08-09 NOTE — Assessment & Plan Note (Addendum)
BP controlled today. Patient increased his own Toprol XL to 37.5mg  daily.  Ok.

## 2013-08-09 NOTE — Assessment & Plan Note (Signed)
Doing well.  On ASA and plavix.  Cardiac rehab to commence.

## 2013-08-19 ENCOUNTER — Telehealth: Payer: Self-pay | Admitting: *Deleted

## 2013-08-19 NOTE — Telephone Encounter (Signed)
Received form today awaiting for Dr Herbie Baltimore to sign-next time in the office

## 2013-08-19 NOTE — Telephone Encounter (Signed)
Returned call and pt verified x 2 w/ pt's wife, Harriett Sine.  Stated Cardiac Rehab at [facility] received order, but it was signed by Wilburt Finlay, PA-C.  Stated pt was told it has to be signed by MD.  Informed Jasmine December, RN, Dr. Elissa Hefty nurse, will be notified.  Verbalized understanding.

## 2013-08-19 NOTE — Telephone Encounter (Signed)
Pt's wife called regarding cardiac rehab prescription that [facility] faxed over to them.

## 2013-08-23 ENCOUNTER — Telehealth: Payer: Self-pay | Admitting: *Deleted

## 2013-08-23 NOTE — Telephone Encounter (Signed)
Informed patient order form was faxed to [facility] Also left message with on wife's voicemail

## 2013-08-23 NOTE — Telephone Encounter (Signed)
PHYSICIAN ORDER- SIGNED -FAXED  FOR CARDIAC REHAB PHASE II

## 2013-08-26 ENCOUNTER — Encounter: Payer: Self-pay | Admitting: Cardiology

## 2013-08-31 ENCOUNTER — Telehealth: Payer: Self-pay | Admitting: Cardiology

## 2013-08-31 NOTE — Telephone Encounter (Signed)
Spoke with wife.Informed her that an extender Franky Macho ,initialize the change. She 's aware the form was faxed to [employer] [phone].  Faxed a copy to her.

## 2013-08-31 NOTE — Telephone Encounter (Signed)
Message forwarded to S. Martin, RN.  

## 2013-08-31 NOTE — Telephone Encounter (Signed)
Spoke with wife. She states that the form that was filled out for disability paper was filled out incorrectly. She will fax a copy to be corrected and then re-faxed to insurance company.

## 2013-08-31 NOTE — Telephone Encounter (Signed)
Please call-concerning his disability papers

## 2013-09-01 ENCOUNTER — Encounter: Payer: Self-pay | Admitting: Cardiology

## 2013-09-01 HISTORY — PX: NM MYOVIEW LTD: HXRAD82

## 2013-09-09 ENCOUNTER — Ambulatory Visit (INDEPENDENT_AMBULATORY_CARE_PROVIDER_SITE_OTHER): Payer: 59 | Admitting: Cardiology

## 2013-09-09 ENCOUNTER — Encounter: Payer: Self-pay | Admitting: Cardiology

## 2013-09-09 VITALS — BP 126/60 | HR 76 | Ht 70.0 in | Wt 224.4 lb

## 2013-09-09 DIAGNOSIS — Z8719 Personal history of other diseases of the digestive system: Secondary | ICD-10-CM

## 2013-09-09 DIAGNOSIS — E785 Hyperlipidemia, unspecified: Secondary | ICD-10-CM

## 2013-09-09 DIAGNOSIS — I213 ST elevation (STEMI) myocardial infarction of unspecified site: Secondary | ICD-10-CM

## 2013-09-09 DIAGNOSIS — I219 Acute myocardial infarction, unspecified: Secondary | ICD-10-CM

## 2013-09-09 DIAGNOSIS — R002 Palpitations: Secondary | ICD-10-CM

## 2013-09-09 DIAGNOSIS — I25810 Atherosclerosis of coronary artery bypass graft(s) without angina pectoris: Secondary | ICD-10-CM

## 2013-09-09 DIAGNOSIS — Z951 Presence of aortocoronary bypass graft: Secondary | ICD-10-CM

## 2013-09-09 DIAGNOSIS — R030 Elevated blood-pressure reading, without diagnosis of hypertension: Secondary | ICD-10-CM

## 2013-09-09 NOTE — Patient Instructions (Signed)
Your physician has requested that you have en exercise stress myoview. For further information please visit [email or web address]. Please follow instruction sheet, as given. Schedule before next Thursday  Your physician wants you to follow-up in [place]---- [facility]on OFFICE--  You will receive a reminder letter in the mail two months in advance. If you don't receive a letter, please call our office to schedule the follow-up appointment.

## 2013-09-10 ENCOUNTER — Encounter: Payer: Self-pay | Admitting: Cardiology

## 2013-09-10 DIAGNOSIS — R002 Palpitations: Secondary | ICD-10-CM | POA: Insufficient documentation

## 2013-09-10 NOTE — Progress Notes (Signed)
PCP: Halina Maidens, MD  Clinic Note: Chief Complaint  Patient presents with  . ROV 1 month    Palpitations-worse with caffeine   HPI: Jonathan Escobar is a 68 y.o. male with a Cardiovascular Problem List below who presents today for his second followup visit since his most recent hospitalization for inferior STEMI. As regards a 76 year old gentleman whom I first met in August 2013 when he presented with an Inferior STEMI. At that time he had an occluded RCA stent that had been previously worked on with 2 different stents followed by brachii therapy. He had additionally significant LAD diagonal lesions and was therefore referred for CABG after emergent balloon angioplasty. He was doing wonderfully until the end of November last year when he presented again with inferior STEMI this time with severe disease throughout the entire SVG-RPDA-RPL (RCA) as well as distal disease in the SVG-diagonal. Dr. Burt Knack was able to open up the SVG-RCA with 4 overlapping stents in a heroic effort. He was then brought back for staged PCI to the SVG-diagonal.  He was discharged in relatively stable condition. He saw Tarri Fuller, PA shortly after discharge December 9, prior to his starting cardiac rehabilitation. He is now in cardiac rehabilitation and doing quite well.  Interval History:  He presents today overall feeling well, he has lots of questions and concerns about his existing graft disease with multiple stents. From a cardiac standpoint helically he does note is some increased frequency of palpitations. He notes it worse with caffeine, and so therefore decreased his caffeine intake.   He otherwise denies any chest tightness pressure with rest or exertion. No dyspnea at rest or exertion. He does a history of GI bleed, and has been on aspirin Plavix with no problems with melena or hematochezia. He denies any nosebleeds or hematuria. He does have palpitations, but is not overly symptomatic with them as far as any  lightheadedness, dizziness, wooziness or syncope/near syncope. No TIA or amaurosis fugax symptoms. No claudication symptoms.  Past Medical History  Diagnosis Date  . CAD S/P percutaneous coronary angioplasty 1999    BMS to prox RCA 1999; Brachytherapy ~2003, followed by DES  PCI ([facility]) for ISR.  [facility] Coronary stent restenosis due to progression of disease 2000; 2005    (While Still In [place]) Status post Brachytherapy to RCA ISR - 2000; Redo PCI with DES 2005  . History of: ST elevation myocardial infarction (STEMI) of inferior wall, subsequent episode of care April 25, 2012;    RCA: 95% ISR followed by 100% stenosis post-stent with significant RPL lesions; LAD: Tandem 80 and 70% lesions involving D1 (1, 1, 1) --> initial angioplasty to RCA aspiration thrombectomy --> urgent CABG;  . S/P CABG x 4: LIMA-LAD, seq SVG-PD-PLA; SVG-diag. 04/28/2012  . H/O echocardiogram 08/19/2012    EF 50-55%, moderate HK of anteroseptal wall, septal dyskinesis/dyssynergy due to poststernotomy;  grade 1 diastolic dysfunction. Mildly dilated LA, mildly reduced RV function-likely due to septal dyssynergy.  . Borderline hypertension   . Hyperlipidemia LDL goal <70   . History of GI bleed      while on Plavix  . History of viral meningitis     following GI bleed   . Obesity (BMI 30.0-34.9)   . CAD (coronary artery disease), native coronary artery -- early Mid LAD tandem 70% lesions with intervening ectatsia; extensive RCA disease with recurrent ISR/thrombosis - not PCI amenable. Plan CABG 04/25/2012  . ST elevation myocardial infarction (STEMI) of inferior wall, subsequent episode  of care  Jul 29, 2013    Diffuse near occlusion of SVG-rPDA, 90% SVG-Diag  . CAD (coronary artery disease), autologous vein bypass graft Nov 2014    Inf STEMI - extensive (full metal Jacket) PCI to SVG-RPDA: Promus DES - 3 mm x 38 mm, 3 mm x 38 mm, 3 mm x 20 mm, 2.75 mm x 38 mm -- all postdilated to ~3.3 mm;; staged PCI to distal  SVG-diagonal: Xience Xpedition 2.5 mm x 20 mm (2.66 mm)   Prior Cardiac Evaluation and Past Surgical History: Past Surgical History  Procedure Laterality Date  . Hernia repair      remotely  . Coronary artery bypass graft  04/28/2012    Procedure: CORONARY ARTERY BYPASS GRAFTING (CABG);  Surgeon: Gaye Pollack, MD;  Location: [facility];  Service: Open Heart Surgery;  Laterality: N/A;  . Cardiac catheterization  07/29/2013    For Inferior STEMI: Left main 30%; LAD proximal 80-90% with aneurysm - comparison flow from widely patent LIMA-LAD; circumflex mid 50%; RCA 100% mid occlusion; SVG-diagonal distal 95% at the anastomosis; SVG-RCA extensive near complete 95% stenosis to distal anastomoses  . Coronary angioplasty with stent placement  November 2014    SVG-RCA: 3 mm x 38 mm, 3 mm 30 mm, 3 mm 20 mm, 2.75 mm x 38 mm (3.3 mm);; distal SVG-D1 - Xience Xpedition 2.5 mm x 23 mm (2.66 mm)  . Transthoracic echocardiogram  07/30/2013    Post Inferior STEMI: EF 50-55% mild LVH. Mild hypokinesis of the distal lateral, and basal to mid inferior lateral and inferior walls -- consistent with RCA infarct   Allergies  Allergen Reactions  . Ace Inhibitors Other (See Comments)    Cough   . Norvasc [Amlodipine Besylate]     Dizzy  . Amoxicillin Rash    Flu like symptoms   MEDICATIONS REVIEWED IN EPIC  History   Social History Narrative   He is a married father of 42, grandfather and 2.   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   He completed cardiac rehabilitation but as [name] got out of the habit of exercising.    ROS: A comprehensive Review of Systems - Negative with the exception of some mild osteoarthritis pains.  PHYSICAL EXAM BP 126/60  Pulse 76  Ht '5\' 10"'  (1.778 m)  Wt  224 lb 6.4 oz (101.787 kg)  BMI 32.20 kg/m2 General appearance: alert, cooperative, appears stated age, no distress and  mildly obese Neck: no adenopathy, no carotid bruit and no JVD Lungs: clear to auscultation bilaterally, normal percussion bilaterally and non-labored Heart: regular rate and rhythm, S1, S2 normal, no murmur, click, rub or gallop; nondisplaced PMI Abdomen: soft, non-tender; bowel sounds normal; no masses,  no organomegaly;  mild truncal obesity Extremities: extremities normal, atraumatic, no cyanosis or edema Pulses: 2+ and symmetric; Skin: normal  Neurologic: Mental status: Alert, oriented, thought content appropriate Cranial nerves: normal (II-XII grossly intact)   [id number] today: No  Recent Labs  None since discharge:   Lipids checked on November 28 and 29th 2040 while inpatient: TC 147 and 120, HDL 42 and 37, LDL 81 and 69, TG 122 and 71  ASSESSMENT / PLAN: STEMI 07/29/13- successful complex full metal jacket stenting of the SVG-PDA/PLA -07/29/2013, staged  SVG-Dx PCI 12/01 His doing quite well following his second STEMI in 15 months. He is relatively lucky that he is quite well aware of his symptoms and presents early enough that he has not had any significant reduction in cardiac EF. A tolerating aspirin plus Plavix. He is on beta blocker and low-dose Diovan. In the past he has not done well with higher doses twice a day metoprolol, but he seems to be doing better on the Toprol. Am somewhat concerned about the palpitations that he is having, as this was one of his warning signs prior to his previous STEMIs.  Plan: Exercise Myoview to evaluate stent and graft patency; partly because the palpitations but also to get a new baseline.  CAD (coronary artery disease), autologous vein bypass graft He is concerned about the long-term patency of this graft with 4 long stents. In reviewing his angiography, at if the graft were closed down, and there is a potential  possibility for redo CABG grafting the RCA vessels again perhaps with an arterial conduit.  Hyperlipidemia LDL goal <70 Labs look pretty good with the increased dose of Lipitor from last visit. He seemed to be tolerating it well.  H/O: GI bleed - 2007 No issues as yet with aspirin plus Plavix. Simply because the extensive stents in the SVG-RCA, I will be reluctant to stop Plavix within the first year. Hopefully he'll be fine. He remains on a PPI.  Borderline hypertension Stable.  Heart palpitations I am concerned that these could be possible warning signs for another potential ischemic event. Thankfully, he seems to be tolerating Toprol (metoprolol succinate) better than Lopressor (metoprolol tartrate).    Plan: Increase Toprol to 50 mg.  Exercise Treadmill Myoview to confirm that the palpitations are not ischemic related    Orders Placed This Encounter  Procedures  . Myocardial Perfusion Imaging    F/u; graft /stent patcency    Standing Status: Future     Number of Occurrences:      Standing Expiration Date: 09/09/2014    Scheduling Instructions:     Please schedule before next thursday    Order Specific Question:  Where should this test be performed    Answer:  [facility]    Order Specific Question:  Type of stress    Answer:  Exercise    Order Specific Question:  Patient weight in lbs    Answer:  224   No orders of the defined types were placed in this encounter.    Followup: 3 months - he will followup with me at the [place] office   Spirit Wernli W. Ellyn Hack, M.D., M.S. [facility] [address] [place], [place]  [address]  [phone] Pager # [phone]

## 2013-09-10 NOTE — Assessment & Plan Note (Deleted)
He is concerned about the long-term patency of this graft with 4 long stents. In reviewing his angiography, at if the graft were closed down, and there is a potential possibility for redo CABG grafting the RCA vessels again perhaps with an arterial conduit. 

## 2013-09-10 NOTE — Assessment & Plan Note (Signed)
Labs look pretty good with the increased dose of Lipitor from last visit. He seemed to be tolerating it well.

## 2013-09-10 NOTE — Assessment & Plan Note (Signed)
He is concerned about the long-term patency of this graft with 4 long stents. In reviewing his angiography, at if the graft were closed down, and there is a potential possibility for redo CABG grafting the RCA vessels again perhaps with an arterial conduit.

## 2013-09-10 NOTE — Assessment & Plan Note (Signed)
No issues as yet with aspirin plus Plavix. Simply because the extensive stents in the SVG-RCA, I will be reluctant to stop Plavix within the first year. Hopefully he'll be fine. He remains on a PPI.

## 2013-09-10 NOTE — Assessment & Plan Note (Signed)
His doing quite well following his second STEMI in 15 months. He is relatively lucky that he is quite well aware of his symptoms and presents early enough that he has not had any significant reduction in cardiac EF. A tolerating aspirin plus Plavix. He is on beta blocker and low-dose Diovan. In the past he has not done well with higher doses twice a day metoprolol, but he seems to be doing better on the Toprol. Am somewhat concerned about the palpitations that he is having, as this was one of his warning signs prior to his previous STEMIs.  Plan: Exercise Myoview to evaluate stent and graft patency; partly because the palpitations but also to get a new baseline.

## 2013-09-10 NOTE — Assessment & Plan Note (Signed)
Stable

## 2013-09-10 NOTE — Assessment & Plan Note (Signed)
I am concerned that these could be possible warning signs for another potential ischemic event. Thankfully, he seems to be tolerating Toprol (metoprolol succinate) better than Lopressor (metoprolol tartrate).    Plan: Increase Toprol to 50 mg.  Exercise Treadmill Myoview to confirm that the palpitations are not ischemic related

## 2013-09-14 ENCOUNTER — Telehealth: Payer: Self-pay | Admitting: Cardiology

## 2013-09-14 ENCOUNTER — Ambulatory Visit (HOSPITAL_COMMUNITY)
Admission: RE | Admit: 2013-09-14 | Discharge: 2013-09-14 | Disposition: A | Discharge status: Home / Self Care | Payer: 59 | Source: Ambulatory Visit | Attending: Cardiovascular Disease | Admitting: Cardiovascular Disease

## 2013-09-14 DIAGNOSIS — I219 Acute myocardial infarction, unspecified: Secondary | ICD-10-CM

## 2013-09-14 DIAGNOSIS — Z951 Presence of aortocoronary bypass graft: Secondary | ICD-10-CM | POA: Insufficient documentation

## 2013-09-14 DIAGNOSIS — I213 ST elevation (STEMI) myocardial infarction of unspecified site: Secondary | ICD-10-CM

## 2013-09-14 MED ORDER — TECHNETIUM TC 99M SESTAMIBI GENERIC - CARDIOLITE
10.0000 | Freq: Once | INTRAVENOUS | Status: AC | PRN
  Administered 2013-09-14: 10 via INTRAVENOUS

## 2013-09-14 MED ORDER — TECHNETIUM TC 99M SESTAMIBI GENERIC - CARDIOLITE
30.0000 | Freq: Once | INTRAVENOUS | Status: AC | PRN
  Administered 2013-09-14: 30 via INTRAVENOUS

## 2013-09-14 NOTE — Telephone Encounter (Signed)
FORWARD TO DR HARDING 

## 2013-09-14 NOTE — Procedures (Addendum)
[facility] [address] [place] [address] [phone]  Cardiology Nuclear Med Study  Rigdon Macomber is a 68 y.o. male     MRN : [id number]     DOB: 1946-04-29  Procedure Date: 09/14/2013  Nuclear Med Background Indication for Stress Test:  Graft Patency and [facility] History:  CAD;MI-07/29/2013;CABG-04/28/2012;STENT/PTCA--07/29/2013 Cardiac Risk Factors: Family History - CAD, History of Smoking, Hypertension, Lipids and Obesity  Symptoms:  DOE and Palpitations   Nuclear Pre-Procedure Caffeine/Decaff Intake:  7:00pm NPO After: 5:00am   IV Site: R Forearm  IV 0.9% NS with Angio Cath:  22g  Chest Size (in):  42"  IV Started by: Azucena Cecil, RN  Height: 5\' 10"  (1.778 m)  Cup Size: n/a  BMI:  Body mass index is 32.14 kg/(m^2). Weight:  224 lb (101.606 kg)   Tech Comments:  n/a    Nuclear Med Study 1 or 2 day study: 1 day  Stress Test Type:  Stress  Order Authorizing Provider:  Glenetta Hew, MD   Resting Radionuclide: Technetium 6m Sestamibi  Resting Radionuclide Dose: 10.6 mCi   Stress Radionuclide:  Technetium 44m Sestamibi  Stress Radionuclide Dose: 30.8 mCi           Stress Protocol Rest HR: 76 Stress HR: 155  Rest BP: 147/103 Stress BP: 191/104  Exercise Time (min): 8:31 METS: 10.10          Dose of Adenosine (mg):  n/a Dose of Lexiscan: n/a mg  Dose of Atropine (mg): n/a Dose of Dobutamine: n/a mcg/kg/min (at max HR)  Stress Test Technologist: Mellody Memos, CCT Nuclear Technologist: Otho Perl, CNMT   Rest Procedure:  Myocardial perfusion imaging was performed at rest 45 minutes following the intravenous administration of Technetium 48m Sestamibi. Stress Procedure:  The patient performed treadmill exercise using a Bruce  Protocol for 8 minutes and 31 seconds. The patient stopped due to fatigue and shortness of breath. Patient denied any chest pain.  There were no significant ST-T wave changes.   Technetium 26m Sestamibi was injected at peak exercise and myocardial perfusion imaging was performed after a brief delay.  Transient Ischemic Dilatation (Normal <1.22):  0.92 Lung/Heart Ratio (Normal <0.45):  0.41 QGS EDV:  97 ml QGS ESV:  49 ml LV Ejection Fraction: 49%  Rest ECG: NSR - Normal EKG  Stress ECG: There are scattered PVCs.  QPS Raw Data Images:  Normal; no motion artifact; normal heart/lung ratio. Stress Images:  There is decreased uptake in the inferior wall. Rest Images:  There is decreased uptake in the inferior wall. Subtraction (SDS):  No evidence of ischemia.  Impression Exercise Capacity:  Good exercise capacity. BP Response:  Normal blood pressure response. Clinical Symptoms:  No significant symptoms noted. ECG Impression:  There are scattered PVCs. Comparison with Prior Nuclear Study: No images to compare  Overall Impression:  Low risk stress nuclear study with a fixed basal inferolateral scar. No ischemia.  LV Wall Motion:  EF 49%, basal inferolateral akinesis.  Pixie Casino, MD, [facility] Board Certified in Nuclear Cardiology Attending Cardiologist [place], MD  09/14/2013 1:49 PM

## 2013-09-14 NOTE — Telephone Encounter (Signed)
Just reminding you to remind Dr Ellyn Hack that he said he would have my results from my stress test this morning, Need this asap for work.

## 2013-09-14 NOTE — Telephone Encounter (Signed)
SPOKE TO PATIENT. PER DR HARDING-ST LOOKS OK . NO ISCHEMIA. JUST FIXED INFERIOR INFARCT-THAT MAKES SENSE, EF 59%.  Dr Ellyn Hack IS WRITING THE NOTE .  PATIENT REQUEST LETTER BE FAXED TO HIM,ONCE COMPLETED. RN INFORMED HIM IT WILL BE FAXED TO PATIENT'S HOME FAXED. IT IS THE SAME AS HOME NUMBER. PATIENT AWARE.

## 2013-09-16 MED ORDER — VALSARTAN 160 MG PO TABS: 160.0000 mg | ORAL_TABLET | Freq: Every day | ORAL | Status: DC

## 2013-09-16 NOTE — Telephone Encounter (Signed)
Spoke to patient. He states he does not need a letter a for work.  Patient states he increased DIOVAN to 160 mg  ;due to increase  Blood pressure. Dr aware  New prescription sent to pharmacy

## 2013-09-19 ENCOUNTER — Other Ambulatory Visit: Payer: Self-pay | Admitting: *Deleted

## 2013-09-19 MED ORDER — VALSARTAN 160 MG PO TABS: 160.0000 mg | ORAL_TABLET | Freq: Every day | ORAL | Status: DC

## 2013-09-19 NOTE — Telephone Encounter (Signed)
Refill will be faxed

## 2013-09-21 ENCOUNTER — Telehealth: Payer: Self-pay | Admitting: Cardiology

## 2013-09-21 NOTE — Telephone Encounter (Signed)
Prescription needs to be called in for Diovan 160 mg one daily to - [place] .[facility] [phone].  The dosage was increased and they need a new prescription   Thanks

## 2013-09-22 ENCOUNTER — Other Ambulatory Visit: Payer: Self-pay | Admitting: *Deleted

## 2013-09-22 MED ORDER — VALSARTAN 160 MG PO TABS: 160.0000 mg | ORAL_TABLET | Freq: Every day | ORAL | Status: DC

## 2013-09-22 NOTE — Telephone Encounter (Signed)
Rx printed b/c pharmacy cannot receive electronic Rxs.  Will print again for signature and fax.

## 2013-09-22 NOTE — Telephone Encounter (Signed)
Wrong pharmacy listed.  Rx sent electronically to [facility]

## 2013-09-22 NOTE — Telephone Encounter (Signed)
ORIGINAL PRESCRIPTION WAS PRINTED INSTEAD OF E -SCRIBED

## 2013-10-02 ENCOUNTER — Encounter: Payer: Self-pay | Admitting: Cardiology

## 2013-10-14 ENCOUNTER — Other Ambulatory Visit: Payer: Self-pay | Admitting: *Deleted

## 2013-10-14 DIAGNOSIS — I2510 Atherosclerotic heart disease of native coronary artery without angina pectoris: Secondary | ICD-10-CM

## 2013-10-14 DIAGNOSIS — Z951 Presence of aortocoronary bypass graft: Secondary | ICD-10-CM

## 2013-10-14 DIAGNOSIS — E785 Hyperlipidemia, unspecified: Secondary | ICD-10-CM

## 2013-10-14 MED ORDER — ATORVASTATIN CALCIUM 20 MG PO TABS: 20.0000 mg | ORAL_TABLET | Freq: Every day | ORAL | Status: DC

## 2013-10-30 ENCOUNTER — Encounter: Payer: Self-pay | Admitting: Cardiology

## 2013-11-30 ENCOUNTER — Encounter: Payer: Self-pay | Admitting: Cardiology

## 2013-12-30 ENCOUNTER — Encounter: Payer: Self-pay | Admitting: Cardiology

## 2014-01-04 ENCOUNTER — Encounter: Payer: Self-pay | Admitting: Cardiology

## 2014-01-04 ENCOUNTER — Ambulatory Visit (INDEPENDENT_AMBULATORY_CARE_PROVIDER_SITE_OTHER): Payer: 59 | Admitting: Cardiology

## 2014-01-04 VITALS — BP 124/86 | HR 87 | Ht 70.0 in | Wt 223.0 lb

## 2014-01-04 DIAGNOSIS — R002 Palpitations: Secondary | ICD-10-CM

## 2014-01-04 DIAGNOSIS — E669 Obesity, unspecified: Secondary | ICD-10-CM

## 2014-01-04 DIAGNOSIS — R030 Elevated blood-pressure reading, without diagnosis of hypertension: Secondary | ICD-10-CM

## 2014-01-04 DIAGNOSIS — I2510 Atherosclerotic heart disease of native coronary artery without angina pectoris: Secondary | ICD-10-CM

## 2014-01-04 DIAGNOSIS — R5381 Other malaise: Secondary | ICD-10-CM

## 2014-01-04 DIAGNOSIS — I2119 ST elevation (STEMI) myocardial infarction involving other coronary artery of inferior wall: Secondary | ICD-10-CM

## 2014-01-04 DIAGNOSIS — E785 Hyperlipidemia, unspecified: Secondary | ICD-10-CM

## 2014-01-04 DIAGNOSIS — Z951 Presence of aortocoronary bypass graft: Secondary | ICD-10-CM

## 2014-01-04 DIAGNOSIS — R5383 Other fatigue: Secondary | ICD-10-CM

## 2014-01-04 DIAGNOSIS — I219 Acute myocardial infarction, unspecified: Secondary | ICD-10-CM

## 2014-01-04 DIAGNOSIS — I213 ST elevation (STEMI) myocardial infarction of unspecified site: Secondary | ICD-10-CM

## 2014-01-04 DIAGNOSIS — I25810 Atherosclerosis of coronary artery bypass graft(s) without angina pectoris: Secondary | ICD-10-CM

## 2014-01-04 MED ORDER — VALSARTAN 320 MG PO TABS: 320.0000 mg | ORAL_TABLET | Freq: Every day | ORAL | Status: DC

## 2014-01-04 NOTE — Patient Instructions (Signed)
Diovan has been refilled   Your physician recommends that you have lab work in:  November [facility]   Please have results sent to [phone]  Your physician wants you to follow-up in: November/December with Dr. Ellyn Hack. You will receive a reminder letter in the mail two months in advance. If you don't receive a letter, please call our office to schedule the follow-up appointment.

## 2014-01-05 ENCOUNTER — Encounter: Payer: Self-pay | Admitting: Cardiology

## 2014-01-05 DIAGNOSIS — R5383 Other fatigue: Secondary | ICD-10-CM | POA: Insufficient documentation

## 2014-01-05 NOTE — Assessment & Plan Note (Signed)
Dramatically improved with increased dose of Toprol to 50 mg. He was relieved with the results of the stress test showing no evidence of ischemia. He probably simply has PVCs which is not unexpected based on the amount of inferior scar.

## 2014-01-05 NOTE — Assessment & Plan Note (Signed)
He now has multiple stents in the vein graft to the right. He is concerned about how long that would survive. In fact would not stented he has, that remains to be seen. He does have an inferior infarct with mildly reduced EF because of it. I don't know how much more damage can be done to the inferior wall, if the graft were to finally shutdown. As for the vein graft to diagonal, that should do relatively well as the graft itself was okay. He has a protected LAD because of his LIMA graft.  Simply due to the amount of stent that he has, I will continue him on dual antiplatelet therapy with aspirin plus Plavix as long as he doesn't have any bleeding concerns. If he were to have a GI bleed, I would probably stop the aspirin.

## 2014-01-05 NOTE — Assessment & Plan Note (Signed)
Quite stable now on higher dose of Diovan (320 mg - increased from 160 mg) and Toprol (50 mg)

## 2014-01-05 NOTE — Assessment & Plan Note (Signed)
At goal based on labs in November. I think we can simply follow this up in a year time frame to be November 2015. I will have her by the lab slip he continues to get labs checked at the [place].

## 2014-01-05 NOTE — Assessment & Plan Note (Signed)
I don't think this is really related to cardiac issues is probably more related to deconditioning. His EF is 45% and is not having any heart failure symptoms. The beta blocker may be playing some role however he is willing to take this side effect to avoid the palpitations.

## 2014-01-05 NOTE — Progress Notes (Signed)
PCP: Halina Maidens, MD  Clinic Note: Chief Complaint  Patient presents with  . OTHER    F/u from stress test C/o fatigue and feeling more tired than usual. Meds reviewed verbally with pt.   HPI: Jonathan Escobar is a 68 y.o. male with a Cardiovascular Problem List below who presents today for four-month followup including followup of recent Exercise Cardiolite Nuclear Stress Test. I last saw him in January 2015. At that time, he noted worsening palpitations and his blood pressure was elevated. We increased his beta blocker dose and subsequently increased his ARB dose. Because of his palpitations being concerning for possible precursor of another cardiac event, he had a stress test done that did not show any ischemia simply showed inferior scar with EF of roughly 45%.  Interval History: He presents today really doing well overall from a cardiac standpoint. He says he is a bit more tired than he used to be when he comes in doing any significant activity, but really contributes it more to deconditioning. He denies any chest tightness/pressure or dyspnea with rest or exertion. No PND, orthopnea or edema. He has not had any problems with lightheadedness or dizziness. His palpitations have dramatically improved with titration of beta blocker. He is willing to take the mild fatigue related to beta blocker if to decrease the palpitations. He denies any syncope/near-syncope, TIA or amaurosis fugax symptoms. No melena hematochezia, hematuria or epistaxis. No claudication.  Past Medical History  Diagnosis Date  . CAD S/P percutaneous coronary angioplasty 1999    BMS to prox RCA 1999; Brachytherapy ~2003, followed by DES  PCI (Taxus) for ISR.  Marland Kitchen Coronary stent restenosis due to progression of disease 2000; 2005    (While Still In [place]) Status post Brachytherapy to RCA ISR - 2000; Redo PCI with DES 2005  . History of: ST elevation myocardial infarction (STEMI) of inferior wall, subsequent episode of  care April 25, 2012;    RCA: 95% ISR followed by 100% stenosis post-stent with significant RPL lesions; LAD: Tandem 80 and 70% lesions involving D1 (1, 1, 1) --> initial angioplasty to RCA aspiration thrombectomy --> urgent CABG;  . S/P CABG x 4: LIMA-LAD, seq SVG-PD-PLA; SVG-diag. 04/28/2012  . H/O echocardiogram 08/19/2012    EF 50-55%, moderate HK of anteroseptal wall, septal dyskinesis/dyssynergy due to poststernotomy;  grade 1 diastolic dysfunction. Mildly dilated LA, mildly reduced RV function-likely due to septal dyssynergy.  . Borderline hypertension   . Hyperlipidemia LDL goal <70   . History of GI bleed      while on Plavix  . History of viral meningitis     following GI bleed   . Obesity (BMI 30.0-34.9)   . CAD (coronary artery disease), native coronary artery -- early Mid LAD tandem 70% lesions with intervening ectatsia; extensive RCA disease with recurrent ISR/thrombosis - not PCI amenable. Plan CABG 04/25/2012  . ST elevation myocardial infarction (STEMI) of inferior wall, subsequent episode of care  Jul 29, 2013    Diffuse near occlusion of SVG-rPDA, 90% SVG-Diag  . CAD (coronary artery disease), autologous vein bypass graft Nov 2014    Inf STEMI - extensive (full metal Jacket) PCI to SVG-RPDA: Promus DES - 3 mm x 38 mm, 3 mm x 38 mm, 3 mm x 20 mm, 2.75 mm x 38 mm -- all postdilated to ~3.3 mm;; staged PCI to distal SVG-diagonal: Xience Xpedition 2.5 mm x 20 mm (2.66 mm)    Prior Cardiac Evaluation, Procedure, Surgical History:  Procedure Laterality  Date  . Coronary artery bypass graft  04/28/2012    Procedure: CORONARY ARTERY BYPASS GRAFTING (CABG);  Surgeon: Gaye Pollack, MD;  Location: [place];  Service: Open Heart Surgery;  Laterality: N/A;  . Cardiac catheterization  07/29/2013    For Inferior STEMI: Left main 30%; LAD proximal 80-90% with aneurysm - comparison flow from widely patent LIMA-LAD; circumflex mid 50%; RCA 100% mid occlusion; SVG-diagonal distal 95% at the  anastomosis; SVG-RCA extensive near complete 95% stenosis to distal anastomoses  . Coronary angioplasty with stent placement  November 2014    SVG-RCA: 3 mm x 38 mm, 3 mm 30 mm, 3 mm 20 mm, 2.75 mm x 38 mm (3.3 mm);; distal SVG-D1 - Xience Xpedition 2.5 mm x 23 mm (2.66 mm)  . Transthoracic echocardiogram  07/30/2013    Post Inferior STEMI: EF 50-55% mild LVH. Mild hypokinesis of the distal lateral, and basal to mid inferior lateral and inferior walls -- consistent with RCA infarct  . Nm myoview ltd  Jan 2015    Moderate Inferior infarct (as expected), but no ischemia & EF ~45-50%    MEDICATIONS AND ALLERGIES REVIEWED IN EPIC No Change in Social and Family History - with the exception of the fact is now cutting back working 2-3 days a week for 13 hours shifts as opposed to 4 days. Unfortunately because of this working he is not known to the cardiac rehabilitation as much as he wanted.  ROS: A comprehensive Review of Systems - Negative except mild fatigue  PHYSICAL EXAM BP 124/86  Pulse 87  Ht _0  (1.778 m)  Wt 223 lb (101.152 kg)  BMI 32.00 kg/m2 General appearance: alert, cooperative, appears stated age, no distress and mildly obese  Neck: no adenopathy, no carotid bruit and no JVD  Lungs: clear to auscultation bilaterally, normal percussion bilaterally and non-labored  Heart: RRR, S1, S2 normal, no murmur, click, rub or gallop; nondisplaced PMI  Abdomen: soft, non-tender; bowel sounds normal; no masses, no organomegaly; mild truncal obesity  Extremities: extremities normal, atraumatic, no cyanosis or edema  Pulses: 2+ and symmetric;  Skin: normal  Neurologic: Mental status: Alert, oriented, thought content appropriate  Cranial nerves: normal (II-XII grossly intact)    Adult ECG Report  Rate:  89 ;  Rhythm: normal sinus rhythm  QRS Axis: -77 ;  PR Interval: 184 ;  QRS Duration: 108 ; QTc: 419; Voltages: Normal  Conduction Disturbances: none  Other Abnormalities: Q waves in 2,3  and aVF consistent with inferior MI, age undetermined; Left Axis Deviation 2/2 prior MI.   Narrative Interpretation: Normal sinus rhythm with left axis deviation secondary to inferior infarct age undetermined  Recent Labs: None  ASSESSMENT / PLAN: Relatively stable from a CAD/Ischemic Cardiomyopathy standpoint.  No medication changes. Recheck Lipids in [place] - f/u after labs. (gets [facility] checked @ [facility])  CAD in native artery - prior Inferior MI - RCA PCI; 04/2012 - Inferior STEMI - RCA occluded (unable to dilate lesion adequately) + LAD & D1 disease --> referred for CABG No active anginal symptoms. Recent Myoview in January with no evidence of ischemia. As expected there is an inferior infarct. This is probably from his initial problems with the RCA in the early 2000s, all by his inferior STEMI in 2013 then yet again and November 2014.     CAD (coronary artery disease), autologous vein bypass graft --> full metal jacket PCI of SVG-RCA; PCI of SVG-D1 anastomosis  He now has multiple stents in the  vein graft to the right. He is concerned about how long that would survive. In fact would not stented he has, that remains to be seen. He does have an inferior infarct with mildly reduced EF because of it. I don't know how much more damage can be done to the inferior wall, if the graft were to finally shutdown. As for the vein graft to diagonal, that should do relatively well as the graft itself was okay. He has a protected LAD because of his LIMA graft.  Simply due to the amount of stent that he has, I will continue him on dual antiplatelet therapy with aspirin plus Plavix as long as he doesn't have any bleeding concerns. If he were to have a GI bleed, I would probably stop the aspirin.  Heart palpitations Dramatically improved with increased dose of Toprol to 50 mg. He was relieved with the results of the stress test showing no evidence of ischemia. He probably simply has PVCs which is not unexpected  based on the amount of inferior scar.  Hyperlipidemia LDL goal <70 At goal based on labs in November. I think we can simply follow this up in a year time frame to be November 2015. I will have her by the lab slip he continues to get labs checked at the [place].  Borderline hypertension Quite stable now on higher dose of Diovan (320 mg - increased from 160 mg) and Toprol (50 mg)  Obesity (BMI 30-39.9) Doing relatively well. Try to work on his diet and hoping he can get back to exercising since he is cutting back some at work.  Fatigue I don't think this is really related to cardiac issues is probably more related to deconditioning. His EF is 45% and is not having any heart failure symptoms. The beta blocker may be playing some role however he is willing to take this side effect to avoid the palpitations.    Orders Placed This Encounter  Procedures  . Lipid Profile    Standing Status: Future     Number of Occurrences:      Standing Expiration Date: 09/06/2014  . Comp Met (CMET)    Standing Status: Future     Number of Occurrences:      Standing Expiration Date: 01/05/2015  . EKG 12-Lead    Order Specific Question:  Where should this test be performed    Answer:  [facility]   Meds ordered this encounter  Medications refilled   . metoprolol succinate (TOPROL-XL) 50 MG 24 hr tablet    Sig: Take 50 mg by mouth daily. Take with or immediately following a meal.  . valsartan (DIOVAN) 320 MG tablet    Sig: Take 1 tablet (320 mg total) by mouth daily.    Dispense:  90 tablet    Refill:  3    Followup: ~6-7 months  DAVID W. Ellyn Hack, M.D., M.S. Interventional Cardiologist [facility]

## 2014-01-05 NOTE — Assessment & Plan Note (Signed)
Doing relatively well. Try to work on his diet and hoping he can get back to exercising since he is cutting back some at work.

## 2014-01-05 NOTE — Assessment & Plan Note (Signed)
No active anginal symptoms. Recent Myoview in January with no evidence of ischemia. As expected there is an inferior infarct. This is probably from his initial problems with the RCA in the early 2000s, all by his inferior STEMI in 2013 then yet again and November 2014.

## 2014-02-16 ENCOUNTER — Telehealth: Payer: Self-pay | Admitting: *Deleted

## 2014-02-16 NOTE — Telephone Encounter (Signed)
[facility] physician clearance  Signed  6/17/15and faxed

## 2014-04-07 ENCOUNTER — Other Ambulatory Visit: Payer: Self-pay | Admitting: *Deleted

## 2014-04-07 MED ORDER — METOPROLOL SUCCINATE ER 50 MG PO TB24: 50.0000 mg | ORAL_TABLET | Freq: Every day | ORAL | Status: DC

## 2014-04-10 ENCOUNTER — Other Ambulatory Visit: Payer: Self-pay

## 2014-04-10 MED ORDER — METOPROLOL SUCCINATE ER 50 MG PO TB24: 50.0000 mg | ORAL_TABLET | Freq: Every day | ORAL | Status: DC

## 2014-07-05 ENCOUNTER — Ambulatory Visit: Payer: Medicare Other | Admitting: Cardiology

## 2014-07-19 ENCOUNTER — Encounter: Payer: Self-pay | Admitting: Cardiology

## 2014-07-19 ENCOUNTER — Ambulatory Visit (INDEPENDENT_AMBULATORY_CARE_PROVIDER_SITE_OTHER): Payer: 59 | Admitting: Cardiology

## 2014-07-19 VITALS — BP 130/96 | HR 92 | Ht 70.0 in | Wt 225.8 lb

## 2014-07-19 DIAGNOSIS — I25810 Atherosclerosis of coronary artery bypass graft(s) without angina pectoris: Secondary | ICD-10-CM

## 2014-07-19 DIAGNOSIS — I2510 Atherosclerotic heart disease of native coronary artery without angina pectoris: Secondary | ICD-10-CM

## 2014-07-19 DIAGNOSIS — E785 Hyperlipidemia, unspecified: Secondary | ICD-10-CM

## 2014-07-19 DIAGNOSIS — E669 Obesity, unspecified: Secondary | ICD-10-CM

## 2014-07-19 DIAGNOSIS — I10 Essential (primary) hypertension: Secondary | ICD-10-CM

## 2014-07-19 DIAGNOSIS — I213 ST elevation (STEMI) myocardial infarction of unspecified site: Secondary | ICD-10-CM

## 2014-07-19 DIAGNOSIS — Z8719 Personal history of other diseases of the digestive system: Secondary | ICD-10-CM

## 2014-07-19 MED ORDER — HYDROCHLOROTHIAZIDE 12.5 MG PO CAPS: 12.5000 mg | ORAL_CAPSULE | Freq: Every day | ORAL | Status: DC

## 2014-07-19 MED ORDER — VALSARTAN-HYDROCHLOROTHIAZIDE 320-12.5 MG PO TABS: 1.0000 | ORAL_TABLET | Freq: Every day | ORAL | Status: DC

## 2014-07-19 NOTE — Assessment & Plan Note (Signed)
He needs to get back into his exercise routine and start losing back some of the weight. He is noticing a little exertional dyspnea but thinks is mostly from deconditioning since he is not doing any exercise recently.

## 2014-07-19 NOTE — Assessment & Plan Note (Signed)
His labs look pretty good from the [place]. He is still on Prozac 20 mg. If his LDL continues to go up we may need to increase to 40 mg of atorvastatin.

## 2014-07-19 NOTE — Assessment & Plan Note (Signed)
Now almost 1 year out from his most recent STEMI. He had multiple stents placed in stage PCI fashion. He is not had any further anginal symptoms or CHF symptoms. Continues to be on stable regimen. There was definite evidence of MI and his last Myoview.  Thankfully he continues to have a relatively preserved EF with no true heart failure symptoms or cardiomyopathy.

## 2014-07-19 NOTE — Patient Instructions (Signed)
Your physician recommends that you have labs today: CBC   Your physician has recommended you make the following change in your medication:  Take HCTZ 12.5 mg once daily for 1 month until you finish your Diovan  Then stop HCTZ and Diovan and start the combination pill Diovan-HCTZ 320/12.5 mg once daily   Your physician wants you to follow-up in: 6 months with Dr. Ellyn Hack. You will receive a reminder letter in the mail two months in advance. If you don't receive a letter, please call our office to schedule the follow-up appointment.

## 2014-07-19 NOTE — Assessment & Plan Note (Signed)
His diastolic pressures aren't creep up now. He is a little bit concerned this has been a harbinger of bad things in the past. I will add low-dose HCTZ. He just filled his Diovan so we will start with just 1 month supply of 12.5 mg HCTZ. Change his Diovan to Diovan-HCTZ from that point forward.

## 2014-07-19 NOTE — Assessment & Plan Note (Signed)
He is on dual antiplatelet therapy and has a history of AVMs. Check a CBC just to ensure that he is not having any problems with the slow GI bleed.

## 2014-07-19 NOTE — Progress Notes (Signed)
PCP: Halina Maidens, MD  Clinic Note: Chief Complaint  Patient presents with  . Follow-up    history of CAD-PCI's followed by CABG and then PCI to native his plus graft; one year status post last STEMI   HPI: Jonathan Escobar is a 68 y.o. male with a PMH below who presents today for six-month follow-up of CAD-CABG with PCI. Now roughly 1 year out from his last STEMI.  He had a stress test done in January that was negative for ischemia but did have an old infarction.  Past Medical History  Diagnosis Date  . CAD S/P percutaneous coronary angioplasty 1999    BMS to prox RCA 1999; Brachytherapy ~2003, followed by DES  PCI (Taxus) for ISR.  Marland Kitchen Coronary stent restenosis due to progression of disease 2000; 2005    (While Still In [place]) Status post Brachytherapy to RCA ISR - 2000; Redo PCI with DES 2005  . History of: ST elevation myocardial infarction (STEMI) of inferior wall, subsequent episode of care April 25, 2012;    RCA: 95% ISR followed by 100% stenosis post-stent with significant RPL lesions; LAD: Tandem 80 and 70% lesions involving D1 (1, 1, 1) --> initial angioplasty to RCA aspiration thrombectomy --> urgent CABG;  . S/P CABG x 4: LIMA-LAD, seq SVG-PD-PLA; SVG-diag. 04/28/2012  . H/O echocardiogram 08/19/2012    EF 50-55%, moderate HK of anteroseptal wall, septal dyskinesis/dyssynergy due to poststernotomy;  grade 1 diastolic dysfunction. Mildly dilated LA, mildly reduced RV function-likely due to septal dyssynergy.  . Borderline hypertension   . Hyperlipidemia LDL goal <70   . History of GI bleed      while on Plavix  . History of viral meningitis     following GI bleed   . Obesity (BMI 30.0-34.9)   . CAD (coronary artery disease), native coronary artery -- early Mid LAD tandem 70% lesions with intervening ectatsia; extensive RCA disease with recurrent ISR/thrombosis - not PCI amenable. Plan CABG 04/25/2012  . ST elevation myocardial infarction (STEMI) of inferior wall,  subsequent episode of care  Jul 29, 2013    Diffuse near occlusion of SVG-rPDA, 90% SVG-Diag  . CAD (coronary artery disease), autologous vein bypass graft Nov 2014    Inf STEMI - extensive (full metal Jacket) PCI to SVG-RPDA: Promus DES - 3 mm x 38 mm, 3 mm x 38 mm, 3 mm x 20 mm, 2.75 mm x 38 mm -- all postdilated to ~3.3 mm;; staged PCI to distal SVG-diagonal: Xience Xpedition 2.5 mm x 20 mm (2.66 mm)    Prior Cardiac Evaluation and Procedural History: Procedure Laterality Date  . Coronary artery bypass graft  04/28/2012    Procedure: CORONARY ARTERY BYPASS GRAFTING (CABG);  Surgeon: Gaye Pollack, MD;  Location: [place];  Service: Open Heart Surgery;  Laterality: N/A;  . Cardiac catheterization  07/29/2013    For Inferior STEMI: Left main 30%; LAD proximal 80-90% with aneurysm - comparison flow from widely patent LIMA-LAD; circumflex mid 50%; RCA 100% mid occlusion; SVG-diagonal distal 95% at the anastomosis; SVG-RCA extensive near complete 95% stenosis to distal anastomoses  . Coronary angioplasty with stent placement  November 2014    SVG-RCA: 3 mm x 38 mm, 3 mm 30 mm, 3 mm 20 mm, 2.75 mm x 38 mm (3.3 mm);; distal SVG-D1 - Xience Xpedition 2.5 mm x 23 mm (2.66 mm)  . Transthoracic echocardiogram  07/30/2013    Post Inferior STEMI: EF 50-55% mild LVH. Mild hypokinesis of the distal lateral, and  basal to mid inferior lateral and inferior walls -- consistent with RCA infarct  . Nm myoview ltd  Jan 2015    Moderate Inferior infarct (as expected), but no ischemia & EF ~45-50%   Interval History: Jonathan Escobar continues to do quite well overall from a cardiac standpoint.  The 1 the is noticing is that he has been having a harder time keeping his weight off.  He has been active, but is not doing routine exercise. He played 9 holes of golf this morning but didn't do much walking, mostly riding in a cart.  With routine activity, he denies any chest tightness or pressure with rest or exertion. No resting or  exertional dyspnea unless he "overdoes it". He is a bit concerned that his diastolic pressures getting higher, denies heart failure symptoms of PND, orthopnea or edema.   No palpitations, lightheadedness, dizziness, weakness, syncope/near syncope, Or TIA/amaurosis fugax symptoms. No claudication.  ROS: A comprehensive was performed. Review of Systems  Constitutional: Negative for weight loss and malaise/fatigue.  HENT: Negative for nosebleeds.   Gastrointestinal: Negative for blood in stool and melena.  Genitourinary: Negative for hematuria.  Musculoskeletal: Positive for joint pain. Negative for myalgias.  Neurological: Negative for dizziness, sensory change, speech change, focal weakness, seizures and loss of consciousness.  Endo/Heme/Allergies: Does not bruise/bleed easily.  Psychiatric/Behavioral: Negative for depression, hallucinations and memory loss. The patient does not have insomnia.   All other systems reviewed and are negative.   Current Outpatient Prescriptions on File Prior to Visit  Medication Sig Dispense Refill  . acetaminophen (TYLENOL) 325 MG tablet Take 2 tablets (650 mg total) by mouth every 4 (four) hours as needed for headache or mild pain.    Marland Kitchen ALPRAZolam (XANAX) 0.25 MG tablet Take 0.125 mg by mouth at bedtime as needed for sleep.    . Ascorbic Acid (VITAMIN C) 1000 MG tablet Take 1,000 mg by mouth daily.    Marland Kitchen aspirin 81 MG tablet Take 81 mg by mouth daily.    Marland Kitchen atorvastatin (LIPITOR) 20 MG tablet Take 1 tablet (20 mg total) by mouth daily. 90 tablet 3  . cetirizine (ZYRTEC) 10 MG tablet Take 10 mg by mouth daily.    . clopidogrel (PLAVIX) 75 MG tablet Take 1 tablet (75 mg total) by mouth daily with breakfast. 90 tablet 3  . metoprolol succinate (TOPROL-XL) 50 MG 24 hr tablet Take 1 tablet (50 mg total) by mouth daily. Take with or immediately following a meal. 90 tablet 2  . nitroGLYCERIN (NITROSTAT) 0.4 MG SL tablet Place 1 tablet (0.4 mg total) under the tongue  every 5 (five) minutes x 3 doses as needed for chest pain. 25 tablet 3  . pantoprazole (PROTONIX) 40 MG tablet Take 1 tablet (40 mg total) by mouth daily at 6 (six) AM. 90 tablet 3   No current facility-administered medications on file prior to visit.   ALLERGIES REVIEWED IN EPIC -- NO change SOCIAL AND FAMILY HISTORY REVIEWED IN EPIC -- NO change  Wt Readings from Last 3 Encounters:  07/19/14 225 lb 12.8 oz (102.422 kg)  01/04/14 223 lb (101.152 kg)  09/14/13 224 lb (101.606 kg)    PHYSICAL EXAM BP 130/96 mmHg  Pulse 92  Ht 5\' 10"  (1.778 m)  Wt 225 lb 12.8 oz (102.422 kg)  BMI 32.40 kg/m2 General appearance: alert, cooperative, appears stated age, no distress and mildly obese  Neck: no adenopathy, no carotid bruit and no JVD  Lungs: clear to auscultation bilaterally, normal percussion  bilaterally and non-labored  Heart: RRR, S1, S2 normal, no murmur, click, rub or gallop; nondisplaced PMI  Abdomen: soft, non-tender; bowel sounds normal; no masses, no organomegaly; mild truncal obesity  Extremities: extremities normal, atraumatic, no cyanosis or edema  Pulses: 2+ and symmetric;  Skin: normal  Neurologic: Mental status: Alert, oriented, thought content appropriate  Cranial nerves: normal (II-XII grossly intact)   Adult ECG Report  Rate: 91 ;  Rhythm: normal sinus rhythm,left axis deviation / LAFB (axis -77) -- cannot exclude inferior MI, age undetermined.  Narrative Interpretation: otherwise normal/stable EKG  Recent Labs:  By his report, recently checked MVA. LDL was roughly 80-87, HDL is 40 and total cholesterol with triglycerides were also normal.   ASSESSMENT / PLAN: STEMI 07/29/13- successful complex full metal jacket stenting of the SVG-PDA/PLA -07/29/2013, staged SVG-Dx PCI 12/01 Now almost 1 year out from his most recent STEMI. He had multiple stents placed in stage PCI fashion. He is not had any further anginal symptoms or CHF symptoms. Continues to be on stable  regimen. There was definite evidence of MI and his last Myoview.  Thankfully he continues to have a relatively preserved EF with no true heart failure symptoms or cardiomyopathy.  CAD in native artery - prior Inferior MI - RCA PCI; 04/2012 - Inferior STEMI - RCA occluded (unable to dilate lesion adequately) + LAD & D1 disease --> referred for CABG Again no active anginal symptoms with no evidence of ischemia by last Myoview. He is on dual antibiotic therapy for his multiple stents. He is on a beta blocker, ARB as well as statin. Would consider Myoview stress test again in roughly January 2017 - simply because he has had extensive PCI's to his multiple grass and has had early graft disease.  Essential hypertension His diastolic pressures aren't creep up now. He is a little bit concerned this has been a harbinger of bad things in the past. I will add low-dose HCTZ. He just filled his Diovan so we will start with just 1 month supply of 12.5 mg HCTZ. Change his Diovan to Diovan-HCTZ from that point forward.  Hyperlipidemia LDL goal <70 His labs look pretty good from the [place]. He is still on Prozac 20 mg. If his LDL continues to go up we may need to increase to 40 mg of atorvastatin.  Obesity (BMI 30-39.9) He needs to get back into his exercise routine and start losing back some of the weight. He is noticing a little exertional dyspnea but thinks is mostly from deconditioning since he is not doing any exercise recently.  H/O: GI bleed - 2007 He is on dual antiplatelet therapy and has a history of AVMs. Check a CBC just to ensure that he is not having any problems with the slow GI bleed.    Orders Placed This Encounter  Procedures  . CBC With differential/Platelet  . EKG 12-Lead   Meds ordered this encounter  Medications  . hydrochlorothiazide (MICROZIDE) 12.5 MG capsule    Sig: Take 1 capsule (12.5 mg total) by mouth daily.    Dispense:  30 capsule    Refill:  0  .  valsartan-hydrochlorothiazide (DIOVAN HCT) 320-12.5 MG per tablet    Sig: Take 1 tablet by mouth daily.    Dispense:  30 tablet    Refill:  6    Followup: 6 months   HARDING,DAVID W, M.D., M.S. Interventional Cardiologist   Pager # [phone]

## 2014-07-19 NOTE — Assessment & Plan Note (Signed)
Again no active anginal symptoms with no evidence of ischemia by last Myoview. He is on dual antibiotic therapy for his multiple stents. He is on a beta blocker, ARB as well as statin. Would consider Myoview stress test again in roughly January 2017 - simply because he has had extensive PCI's to his multiple grass and has had early graft disease.

## 2014-07-20 LAB — CBC WITH DIFFERENTIAL
BASOS: 0 %
Basophils Absolute: 0 10*3/uL (ref 0.0–0.2)
Eos: 6 %
Eosinophils Absolute: 0.4 10*3/uL (ref 0.0–0.4)
HEMATOCRIT: 40.9 % (ref 37.5–51.0)
HEMOGLOBIN: 14.1 g/dL (ref 12.6–17.7)
IMMATURE GRANS (ABS): 0.1 10*3/uL (ref 0.0–0.1)
Immature Granulocytes: 1 %
LYMPHS: 21 %
Lymphocytes Absolute: 1.3 10*3/uL (ref 0.7–3.1)
MCH: 31.1 pg (ref 26.6–33.0)
MCHC: 34.5 g/dL (ref 31.5–35.7)
MCV: 90 fL (ref 79–97)
MONOS ABS: 0.7 10*3/uL (ref 0.1–0.9)
Monocytes: 12 %
NEUTROS ABS: 3.8 10*3/uL (ref 1.4–7.0)
Neutrophils Relative %: 60 %
Platelets: 234 10*3/uL (ref 150–379)
RBC: 4.53 x10E6/uL (ref 4.14–5.80)
RDW: 13.5 % (ref 12.3–15.4)
WBC: 6.3 10*3/uL (ref 3.4–10.8)

## 2014-08-10 ENCOUNTER — Encounter (HOSPITAL_COMMUNITY): Payer: Self-pay | Admitting: Cardiology

## 2014-09-12 ENCOUNTER — Ambulatory Visit: Payer: Self-pay | Admitting: Family Medicine

## 2014-09-14 ENCOUNTER — Telehealth: Payer: Self-pay | Admitting: Cardiology

## 2014-09-14 DIAGNOSIS — I10 Essential (primary) hypertension: Secondary | ICD-10-CM

## 2014-09-14 DIAGNOSIS — I213 ST elevation (STEMI) myocardial infarction of unspecified site: Secondary | ICD-10-CM

## 2014-09-14 DIAGNOSIS — I25810 Atherosclerosis of coronary artery bypass graft(s) without angina pectoris: Secondary | ICD-10-CM

## 2014-09-14 MED ORDER — VALSARTAN-HYDROCHLOROTHIAZIDE 320-12.5 MG PO TABS: 1.0000 | ORAL_TABLET | Freq: Every day | ORAL | Status: DC

## 2014-09-14 NOTE — Telephone Encounter (Signed)
Pt need a prescription for his Valsartan #90,been getting #30 and refills. Please call to [facility] at [phone].

## 2014-09-14 NOTE — Telephone Encounter (Signed)
Refill submitted to patient's preferred pharmacy. Informed patient. Pt voiced understanding, no other stated concerns at this time.  

## 2014-09-16 ENCOUNTER — Ambulatory Visit: Payer: Self-pay | Admitting: Family Medicine

## 2014-10-09 ENCOUNTER — Other Ambulatory Visit: Payer: Self-pay | Admitting: Cardiology

## 2014-10-09 NOTE — Telephone Encounter (Signed)
Rx(s) sent to pharmacy electronically.  

## 2015-01-05 ENCOUNTER — Telehealth: Payer: Self-pay | Admitting: Cardiology

## 2015-01-08 ENCOUNTER — Other Ambulatory Visit: Payer: Self-pay | Admitting: Cardiology

## 2015-01-08 NOTE — Telephone Encounter (Signed)
Close encounter 

## 2015-01-09 NOTE — Telephone Encounter (Signed)
Rx(s) sent to pharmacy electronically.  

## 2015-01-10 ENCOUNTER — Ambulatory Visit: Payer: Medicare Other | Admitting: Cardiology

## 2015-03-01 ENCOUNTER — Ambulatory Visit (INDEPENDENT_AMBULATORY_CARE_PROVIDER_SITE_OTHER): Payer: 59 | Admitting: Cardiology

## 2015-03-01 ENCOUNTER — Encounter: Payer: Self-pay | Admitting: Cardiology

## 2015-03-01 VITALS — BP 134/90 | HR 62 | Ht 69.0 in | Wt 221.0 lb

## 2015-03-01 DIAGNOSIS — E785 Hyperlipidemia, unspecified: Secondary | ICD-10-CM

## 2015-03-01 DIAGNOSIS — Z79899 Other long term (current) drug therapy: Secondary | ICD-10-CM

## 2015-03-01 DIAGNOSIS — I2111 ST elevation (STEMI) myocardial infarction involving right coronary artery: Secondary | ICD-10-CM

## 2015-03-01 DIAGNOSIS — I10 Essential (primary) hypertension: Secondary | ICD-10-CM

## 2015-03-01 DIAGNOSIS — I25810 Atherosclerosis of coronary artery bypass graft(s) without angina pectoris: Secondary | ICD-10-CM | POA: Diagnosis not present

## 2015-03-01 DIAGNOSIS — Z951 Presence of aortocoronary bypass graft: Secondary | ICD-10-CM | POA: Diagnosis not present

## 2015-03-01 DIAGNOSIS — I213 ST elevation (STEMI) myocardial infarction of unspecified site: Secondary | ICD-10-CM

## 2015-03-01 DIAGNOSIS — E669 Obesity, unspecified: Secondary | ICD-10-CM

## 2015-03-01 DIAGNOSIS — Z8719 Personal history of other diseases of the digestive system: Secondary | ICD-10-CM

## 2015-03-01 DIAGNOSIS — I2510 Atherosclerotic heart disease of native coronary artery without angina pectoris: Secondary | ICD-10-CM

## 2015-03-01 MED ORDER — ATORVASTATIN CALCIUM 20 MG PO TABS: 20.0000 mg | ORAL_TABLET | Freq: Every day | ORAL | Status: DC

## 2015-03-01 MED ORDER — VALSARTAN-HYDROCHLOROTHIAZIDE 320-12.5 MG PO TABS: 1.0000 | ORAL_TABLET | Freq: Every day | ORAL | Status: DC

## 2015-03-01 MED ORDER — NITROGLYCERIN 0.4 MG SL SUBL: 0.4000 mg | SUBLINGUAL_TABLET | SUBLINGUAL | Status: DC | PRN

## 2015-03-01 MED ORDER — METOPROLOL SUCCINATE ER 50 MG PO TB24: 50.0000 mg | ORAL_TABLET | Freq: Every day | ORAL | Status: DC

## 2015-03-01 NOTE — Patient Instructions (Addendum)
  Labs - CMP,LIPIDS, CBC   NO  OTHER CHANGES WITH MEDICATIONS    Your physician wants you to follow-up in   [place] 30 MIN APPOINTMENT.  You will receive a reminder letter in the mail two months in advance. If you don't receive a letter, please call our office to schedule the follow-up appointment.

## 2015-03-01 NOTE — Progress Notes (Signed)
PCP: Halina Maidens, MD  Clinic Note: Chief Complaint  Patient presents with  . Follow-up    81month up; no chest pain, occassional shortness of breath, no edema, no pain in legs, no cramping in legs, no lightheadedness, no dizziness  . Coronary Artery Disease   HPI: Jonathan Escobar is a 69 y.o. male with a PMH below who presents today for six-month follow-up of CAD-CABG with PCI. Now roughly 1 year out from his last STEMI.  He had a stress test done in January that was negative for ischemia but did have an old infarction.  Last seen in November 2015.  Interval History: [name] continues to do quite well overall from a cardiac standpoint.  No major complaints.  He remains active, but is not doing routine exercise -- just having a hard time "getting into a routine". With routine activity, he denies any chest tightness or pressure with rest or exertion. No resting or exertional dyspnea unless he "overdoes it". He denies heart failure symptoms of PND, orthopnea or edema.   No palpitations, lightheadedness, dizziness, weakness, syncope/near syncope, Or TIA/amaurosis fugax symptoms. No claudication.   Past Medical History  Diagnosis Date  . CAD S/P percutaneous coronary angioplasty 1999    BMS to prox RCA 1999; Brachytherapy ~2003, followed by DES  PCI ([facility]) for ISR.  Marland Kitchen Coronary stent restenosis due to progression of disease 2000; 2005    (While Still In [place]) Status post Brachytherapy to RCA ISR - 2000; Redo PCI with DES 2005  . History of: ST elevation myocardial infarction (STEMI) of inferior wall, subsequent episode of care April 25, 2012;    RCA: 95% ISR followed by 100% stenosis post-stent with significant RPL lesions; LAD: Tandem 80 and 70% lesions involving D1 (1, 1, 1) --> initial angioplasty to RCA aspiration thrombectomy --> urgent CABG;  . S/P CABG x 4: LIMA-LAD, seq SVG-PD-PLA; SVG-diag. 04/28/2012  . ST elevation myocardial infarction (STEMI) of inferior wall, subsequent  episode of care  Jul 29, 2013    Diffuse near occlusion of SVG-rPDA, 90% SVG-Diag  . CAD (coronary artery disease), autologous vein bypass graft Nov 2014    Inf STEMI - PCI to SVG-RPDA:   . S/P coronary artery stent placement Nov-Dec 2014    a) Inf STEMIL 11/28/'14 --> extensive (full metal Jacket) of SVG-RPDA Promus DES - 3 mm x 38 mm, 3 mm x 38 mm, 3 mm x 20 mm, 2.75 mm x 38 mm -- all postdilated to ~3.3 mm;; b) 12/1/'14: staged PCI to distal SVG-diagonal: Xience Xpedition 2.5 mm x 20 mm (2.66 mm)  . Hyperlipidemia LDL goal <70   . Essential hypertension   . Obesity (BMI 30.0-34.9)   . History of GI bleed      while on Plavix  . History of viral meningitis     following GI bleed     Past Surgical History  Procedure Laterality Date  . Cardiac catheterization  04/28/12    RCA: 95% ISR followed by 100% stenosis post-stent with significant RPL lesions; LAD: Tandem 80 and 70% lesions involving D1 (1, 1, 1) --> initial angioplasty to RCA aspiration thrombectomy --> urgent CABG;  . Coronary artery bypass graft  04/28/2012    Procedure: CORONARY ARTERY BYPASS GRAFTING (CABG);  Surgeon: Gaye Pollack, MD;  Location: [place];  Service: Open Heart Surgery;  Laterality: N/A;  . Transthoracic echocardiogram  12/'13; 07/30/2013    a) Post CABG: EF 50-55%, moderate HK of anteroseptal wall, septal dyskinesis/dyssynergy  due to poststernotomy;  grade 1 diastolic dysfunction. Mildly dilated LA, mildly reduced RV function-likely due to septal dyssynergy.; b) 11/'14 Post Inferior STEMI: EF 50-55% mild LVH. Mild hypokinesis of the distal lateral, and basal to mid inferior lateral and inferior walls -- consistent with RCA infarct  . Left and right heart catheterization with coronary angiogram N/A 04/25/2012    Procedure: LEFT AND RIGHT HEART CATHETERIZATION WITH CORONARY ANGIOGRAM;  Surgeon: Leonie Man, MD;  Location: [facility] CATH LAB;  Service: Cardiovascular;  Laterality: N/A;  . Left heart catheterization with  coronary/graft angiogram  07/29/2013    Procedure: LEFT HEART CATHETERIZATION WITH Beatrix Fetters;  Surgeon: Blane Ohara, MD;  Location: [facility] CATH LAB;  Service: Cardiovascular; ; 95% stenosis with percent mid occlusion of SVG-RPDA; 90% anastomotic SVG-diagonal, moderate 50% disease in the native circumflex with patent OM 1 OM 2. 80-90% proximal LAD with patent LIMA to LAD , EF 50%  . Percutaneous coronary stent intervention (pci-s)  07/29/2013    Procedure: PERCUTANEOUS CORONARY STENT INTERVENTION (PCI-S);  Surgeon: Blane Ohara, MD;  Location: [facility] CATH LAB;  Service: Cardiovascular;; PCI to SVG-RPDA: Promus DES - 3 mm x 38 mm, 3 mm x 38 mm, 3 mm x 20 mm, 2.75 mm x 38 mm -- all postdilated to ~3.3 mm;  . Percutaneous coronary stent intervention (pci-s) N/A 08/01/2013    Procedure: PERCUTANEOUS CORONARY STENT INTERVENTION (PCI-S);  Surgeon: Leonie Man, MD;  Location: [facility] CATH LAB;  Service: Cardiovascular;staged PCI to distal SVG-diagonal: Xience Xpedition 2.5 mm x 20 mm (2.66 mm)    . Nm myoview ltd  Jan 2015    Moderate Inferior infarct (as expected), but no ischemia & EF ~45-50%  . Hernia repair       ROS: A comprehensive was performed. Review of Systems  Constitutional: Negative for weight loss and malaise/fatigue.  HENT: Negative for nosebleeds.   Gastrointestinal: Negative for blood in stool and melena.  Genitourinary: Negative for hematuria.  Musculoskeletal: Positive for joint pain. Negative for myalgias.  Neurological: Negative for dizziness, sensory change, speech change, focal weakness, seizures and loss of consciousness.  Endo/Heme/Allergies: Does not bruise/bleed easily.  Psychiatric/Behavioral: Negative for depression, hallucinations and memory loss. The patient does not have insomnia.   All other systems reviewed and are negative.   Current Outpatient Prescriptions on File Prior to Visit  Medication Sig Dispense Refill  . acetaminophen (TYLENOL) 325 MG  tablet Take 2 tablets (650 mg total) by mouth every 4 (four) hours as needed for headache or mild pain.    Marland Kitchen ALPRAZolam (XANAX) 0.25 MG tablet Take 0.125 mg by mouth at bedtime as needed for sleep.    . Ascorbic Acid (VITAMIN C) 1000 MG tablet Take 1,000 mg by mouth daily.    Marland Kitchen aspirin 81 MG tablet Take 81 mg by mouth daily.    . cetirizine (ZYRTEC) 10 MG tablet Take 10 mg by mouth daily.    . clopidogrel (PLAVIX) 75 MG tablet Take 1 tablet (75 mg total) by mouth daily with breakfast. 90 tablet 3  . pantoprazole (PROTONIX) 40 MG tablet Take 1 tablet (40 mg total) by mouth daily at 6 (six) AM. 90 tablet 3   No current facility-administered medications on file prior to visit.   Allergies  Allergen Reactions  . Ace Inhibitors Other (See Comments)    Cough   . Norvasc [Amlodipine Besylate]     Dizzy  . Amoxicillin Rash    Flu like symptoms   SOCIAL &  FAMILY HISTORY  reports that he quit smoking about 38 years ago. His smoking use included Cigarettes. He has a 10 pack-year smoking history. He has never used smokeless tobacco. He reports that he drinks about 8.4 oz of alcohol per week. He reports that he does not use illicit drugs. family history includes Alzheimer's disease in his mother; Coronary artery disease in his father; Heart failure in his father; Valvular heart disease in his sister.   Wt Readings from Last 3 Encounters:  03/01/15 100.245 kg (221 lb)  07/19/14 102.422 kg (225 lb 12.8 oz)  01/04/14 101.152 kg (223 lb)    PHYSICAL EXAM BP 134/90 mmHg  Pulse 62  Ht 5\' 9"  (1.753 m)  Wt 100.245 kg (221 lb)  BMI 32.62 kg/m2 General appearance: alert, cooperative, appears stated age, no distress and mildly obese  Neck: no adenopathy, no carotid bruit and no JVD  Lungs: clear to auscultation bilaterally, normal percussion bilaterally and non-labored  Heart: RRR, S1, S2 normal, no murmur, click, rub or gallop; nondisplaced PMI  Abdomen: soft, non-tender; bowel sounds normal; no  masses, no organomegaly; mild truncal obesity  Extremities: extremities normal, atraumatic, no cyanosis or edema  Pulses: 2+ and symmetric;  Skin: normal  Neurologic: Mental status: Alert, oriented, thought content appropriate  Cranial nerves: normal (II-XII grossly intact)   Adult ECG Report  Rate: 62;  Rhythm: normal sinus rhythm,left axis deviation / LAFB (axis -49) -- cannot exclude inferior MI, age undetermined.  Narrative Interpretation: otherwise normal/stable EKG  Recent Labs:  Monitored by PCP.   ASSESSMENT / PLAN: Problem List Items Addressed This Visit    CAD (coronary artery disease), autologous vein bypass graft --> full metal jacket PCI of SVG-RCA; PCI of SVG-D1 anastomosis - Primary (Chronic)   Relevant Medications   metoprolol succinate (TOPROL-XL) 50 MG 24 hr tablet   valsartan-hydrochlorothiazide (DIOVAN HCT) 320-12.5 MG per tablet   atorvastatin (LIPITOR) 20 MG tablet   nitroGLYCERIN (NITROSTAT) 0.4 MG SL tablet   Other Relevant Orders   EKG 12-Lead (Completed)   Comprehensive metabolic panel   Lipid panel   CBC   CAD in native artery - prior Inferior MI - RCA PCI; 04/2012 - Inferior STEMI - RCA occluded (unable to dilate lesion adequately) + LAD & D1 disease --> referred for CABG (Chronic)    Continue aggressive risk modification. He is on statin, beta blocker, ARB and aspirin/Plavix. Consider Myoview stress test following next six-month followup.      Relevant Medications   metoprolol succinate (TOPROL-XL) 50 MG 24 hr tablet   valsartan-hydrochlorothiazide (DIOVAN HCT) 320-12.5 MG per tablet   atorvastatin (LIPITOR) 20 MG tablet   nitroGLYCERIN (NITROSTAT) 0.4 MG SL tablet   Essential hypertension (Chronic)    Diastolic pressure remains borderline elevated, but I would be reluctant to increase his antihypertensives any further meds currently are as noted require adding another medicine. Currently on moderate dose of Toprol (limited by heart rate) as well as  Diovan/HCT. He says at home his pressures are much better.      Relevant Medications   metoprolol succinate (TOPROL-XL) 50 MG 24 hr tablet   valsartan-hydrochlorothiazide (DIOVAN HCT) 320-12.5 MG per tablet   atorvastatin (LIPITOR) 20 MG tablet   nitroGLYCERIN (NITROSTAT) 0.4 MG SL tablet   Other Relevant Orders   EKG 12-Lead (Completed)   Comprehensive metabolic panel   Lipid panel   CBC   H/O: GI bleed - 2007 on Plavix (Chronic)    On aspirin plus Plavix.  Check  CBC to ensure no occult anemia.      Relevant Orders   CBC   Hyperlipidemia LDL goal <70 (Chronic)    Labs and inject at the [place]. Continues to be on Lipitor at 20 mg. We will go ahead and check fasting panel and chemistry panel -- in order to determine if we need to increase his Lipitor.      Relevant Medications   metoprolol succinate (TOPROL-XL) 50 MG 24 hr tablet   valsartan-hydrochlorothiazide (DIOVAN HCT) 320-12.5 MG per tablet   atorvastatin (LIPITOR) 20 MG tablet   nitroGLYCERIN (NITROSTAT) 0.4 MG SL tablet   Obesity (BMI 30-39.9) (Chronic)    The patient understands the need to lose weight with diet and exercise. We have discussed specific strategies for this.      STEMI 07/29/13- successful complex full metal jacket stenting of the SVG-PDA/PLA -07/29/2013, staged SVG-Dx PCI 12/01 (Chronic)    Doing quite well following his inferior STEMI with extensive PCI to SVG-RCA & SVG-Diag. Moderate infarct noted on Myoview, but no ischemia. No active heart failure or anginal symptoms.      Relevant Medications   metoprolol succinate (TOPROL-XL) 50 MG 24 hr tablet   valsartan-hydrochlorothiazide (DIOVAN HCT) 320-12.5 MG per tablet   atorvastatin (LIPITOR) 20 MG tablet   nitroGLYCERIN (NITROSTAT) 0.4 MG SL tablet   Other Relevant Orders   EKG 12-Lead (Completed)   Comprehensive metabolic panel   Lipid panel   CBC    Other Visit Diagnoses    Polypharmacy        Relevant Orders    Comprehensive metabolic panel     Lipid panel    CBC        Orders Placed This Encounter  Procedures  . Comprehensive metabolic panel    Order Specific Question:  Has the patient fasted?    Answer:  Yes  . Lipid panel    Order Specific Question:  Has the patient fasted?    Answer:  Yes  . CBC  . EKG 12-Lead   Refills Meds ordered this encounter  Medications  . vitamin B-12 (CYANOCOBALAMIN) 1000 MCG tablet    Sig: Take 1,000 mcg by mouth daily.  . metoprolol succinate (TOPROL-XL) 50 MG 24 hr tablet    Sig: Take 1 tablet (50 mg total) by mouth daily.    Dispense:  90 tablet    Refill:  3  . valsartan-hydrochlorothiazide (DIOVAN HCT) 320-12.5 MG per tablet    Sig: Take 1 tablet by mouth daily.    Dispense:  90 tablet    Refill:  3  . atorvastatin (LIPITOR) 20 MG tablet    Sig: Take 1 tablet (20 mg total) by mouth daily at 6 PM.    Dispense:  90 tablet    Refill:  3  . nitroGLYCERIN (NITROSTAT) 0.4 MG SL tablet    Sig: Place 1 tablet (0.4 mg total) under the tongue every 5 (five) minutes x 3 doses as needed for chest pain.    Dispense:  25 tablet    Refill:  3   PATIENT INSTRUCTIONS:  Labs - CMP,LIPIDS, CBC  NO  OTHER CHANGES WITH MEDICATIONS   Your physician wants you to follow-up in   6 MONTH   DR Teiana Hajduk - Pricilla Handler, Leonie Green, M.D., M.S. Interventional Cardiologist   Pager # [phone]

## 2015-03-03 ENCOUNTER — Encounter: Payer: Self-pay | Admitting: Cardiology

## 2015-03-03 NOTE — Assessment & Plan Note (Signed)
The patient understands the need to lose weight with diet and exercise. We have discussed specific strategies for this.  

## 2015-03-03 NOTE — Assessment & Plan Note (Signed)
Doing quite well following his inferior STEMI with extensive PCI to SVG-RCA & SVG-Diag. Moderate infarct noted on Myoview, but no ischemia. No active heart failure or anginal symptoms.

## 2015-03-03 NOTE — Assessment & Plan Note (Signed)
On aspirin plus Plavix.  Check CBC to ensure no occult anemia.

## 2015-03-03 NOTE — Assessment & Plan Note (Signed)
Diastolic pressure remains borderline elevated, but I would be reluctant to increase his antihypertensives any further meds currently are as noted require adding another medicine. Currently on moderate dose of Toprol (limited by heart rate) as well as Diovan/HCT. He says at home his pressures are much better.

## 2015-03-03 NOTE — Assessment & Plan Note (Signed)
Continue aggressive risk modification. He is on statin, beta blocker, ARB and aspirin/Plavix. Consider Myoview stress test following next six-month followup.

## 2015-03-03 NOTE — Assessment & Plan Note (Signed)
Labs and inject at the [place]. Continues to be on Lipitor at 20 mg. We will go ahead and check fasting panel and chemistry panel -- in order to determine if we need to increase his Lipitor.

## 2015-03-21 ENCOUNTER — Ambulatory Visit
Admission: EM | Admit: 2015-03-21 | Discharge: 2015-03-21 | Discharge status: Absent without leave | Payer: Worker's Compensation

## 2015-03-21 ENCOUNTER — Ambulatory Visit
Admission: EM | Admit: 2015-03-21 | Discharge: 2015-03-21 | Disposition: A | Discharge status: Home / Self Care | Payer: Worker's Compensation | Attending: Internal Medicine | Admitting: Internal Medicine

## 2015-03-21 DIAGNOSIS — S61231A Puncture wound without foreign body of left index finger without damage to nail, initial encounter: Secondary | ICD-10-CM

## 2015-03-21 DIAGNOSIS — W278XXA Contact with other nonpowered hand tool, initial encounter: Secondary | ICD-10-CM

## 2015-03-21 DIAGNOSIS — W461XXA Contact with contaminated hypodermic needle, initial encounter: Secondary | ICD-10-CM

## 2015-03-21 DIAGNOSIS — Z7721 Contact with and (suspected) exposure to potentially hazardous body fluids: Secondary | ICD-10-CM | POA: Diagnosis not present

## 2015-03-21 MED ORDER — DOXYCYCLINE HYCLATE 100 MG PO CAPS: 100.0000 mg | ORAL_CAPSULE | Freq: Two times a day (BID) | ORAL | Status: DC

## 2015-03-21 NOTE — ED Provider Notes (Signed)
CSN: [id number]     Arrival date & time 03/21/15  1857 History   None    Chief Complaint  Patient presents with  . Body Fluid Exposure   HPI 69 yo with puncture wound to L 2nd finger during abscess I&D at work. Pt washed hands long and thoroughly with soap/water following incident.  Past Medical History  Diagnosis Date  . CAD S/P percutaneous coronary angioplasty 1999    BMS to prox RCA 1999; Brachytherapy ~2003, followed by DES  PCI ([facility]) for ISR.  Marland Kitchen Coronary stent restenosis due to progression of disease 2000; 2005    (While Still In [place]) Status post Brachytherapy to RCA ISR - 2000; Redo PCI with DES 2005  . History of: ST elevation myocardial infarction (STEMI) of inferior wall, subsequent episode of care April 25, 2012;    RCA: 95% ISR followed by 100% stenosis post-stent with significant RPL lesions; LAD: Tandem 80 and 70% lesions involving D1 (1, 1, 1) --> initial angioplasty to RCA aspiration thrombectomy --> urgent CABG;  . S/P CABG x 4: LIMA-LAD, seq SVG-PD-PLA; SVG-diag. 04/28/2012  . ST elevation myocardial infarction (STEMI) of inferior wall, subsequent episode of care  Jul 29, 2013    Diffuse near occlusion of SVG-rPDA, 90% SVG-Diag  . CAD (coronary artery disease), autologous vein bypass graft Nov 2014    Inf STEMI - PCI to SVG-RPDA:   . S/P coronary artery stent placement Nov-Dec 2014    a) Inf STEMIL 11/28/'14 --> extensive (full metal Jacket) of SVG-RPDA Promus DES - 3 mm x 38 mm, 3 mm x 38 mm, 3 mm x 20 mm, 2.75 mm x 38 mm -- all postdilated to ~3.3 mm;; b) 12/1/'14: staged PCI to distal SVG-diagonal: Xience Xpedition 2.5 mm x 20 mm (2.66 mm)  . Hyperlipidemia LDL goal <70   . Essential hypertension   . Obesity (BMI 30.0-34.9)   . History of GI bleed      while on Plavix  . History of viral meningitis     following GI bleed    Past Surgical History  Procedure Laterality Date  . Cardiac catheterization  04/28/12    RCA: 95% ISR followed by 100% stenosis  post-stent with significant RPL lesions; LAD: Tandem 80 and 70% lesions involving D1 (1, 1, 1) --> initial angioplasty to RCA aspiration thrombectomy --> urgent CABG;  . Coronary artery bypass graft  04/28/2012    Procedure: CORONARY ARTERY BYPASS GRAFTING (CABG);  Surgeon: Gaye Pollack, MD;  Location: [place];  Service: Open Heart Surgery;  Laterality: N/A;  . Transthoracic echocardiogram  12/'13; 07/30/2013    a) Post CABG: EF 50-55%, moderate HK of anteroseptal wall, septal dyskinesis/dyssynergy due to poststernotomy;  grade 1 diastolic dysfunction. Mildly dilated LA, mildly reduced RV function-likely due to septal dyssynergy.; b) 11/'14 Post Inferior STEMI: EF 50-55% mild LVH. Mild hypokinesis of the distal lateral, and basal to mid inferior lateral and inferior walls -- consistent with RCA infarct  . Left and right heart catheterization with coronary angiogram N/A 04/25/2012    Procedure: LEFT AND RIGHT HEART CATHETERIZATION WITH CORONARY ANGIOGRAM;  Surgeon: Leonie Man, MD;  Location: [facility] CATH LAB;  Service: Cardiovascular;  Laterality: N/A;  . Left heart catheterization with coronary/graft angiogram  07/29/2013    Procedure: LEFT HEART CATHETERIZATION WITH Beatrix Fetters;  Surgeon: Blane Ohara, MD;  Location: [facility] CATH LAB;  Service: Cardiovascular; ; 95% stenosis with percent mid occlusion of SVG-RPDA; 90% anastomotic SVG-diagonal, moderate 50% disease  in the native circumflex with patent OM 1 OM 2. 80-90% proximal LAD with patent LIMA to LAD , EF 50%  . Percutaneous coronary stent intervention (pci-s)  07/29/2013    Procedure: PERCUTANEOUS CORONARY STENT INTERVENTION (PCI-S);  Surgeon: Blane Ohara, MD;  Location: [facility] CATH LAB;  Service: Cardiovascular;; PCI to SVG-RPDA: Promus DES - 3 mm x 38 mm, 3 mm x 38 mm, 3 mm x 20 mm, 2.75 mm x 38 mm -- all postdilated to ~3.3 mm;  . Percutaneous coronary stent intervention (pci-s) N/A 08/01/2013    Procedure: PERCUTANEOUS CORONARY STENT  INTERVENTION (PCI-S);  Surgeon: Leonie Man, MD;  Location: [facility] CATH LAB;  Service: Cardiovascular;staged PCI to distal SVG-diagonal: Xience Xpedition 2.5 mm x 20 mm (2.66 mm)    . Nm myoview ltd  Jan 2015    Moderate Inferior infarct (as expected), but no ischemia & EF ~45-50%  . Hernia repair     Family History  Problem Relation Age of Onset  . Alzheimer's disease Mother   . Heart failure Father   . Coronary artery disease Father   . Valvular heart disease Sister    History  Substance Use Topics  . Smoking status: Former Smoker -- 1.00 packs/day for 10 years    Types: Cigarettes    Quit date: 09/01/1976  . Smokeless tobacco: Never Used  . Alcohol Use: 8.4 oz/week    14 Glasses of wine per week    Review of Systems  All other systems reviewed and are negative.   Allergies  Ace inhibitors; Norvasc; and Amoxicillin  Home Medications   Prior to Admission medications   Medication Sig Start Date End Date Taking? Authorizing Provider  aspirin 81 MG tablet Take 81 mg by mouth daily.   Yes Historical Provider, MD  atorvastatin (LIPITOR) 20 MG tablet Take 1 tablet (20 mg total) by mouth daily at 6 PM. 03/01/15  Yes Leonie Man, MD  cetirizine (ZYRTEC) 10 MG tablet Take 10 mg by mouth daily.   Yes Historical Provider, MD  clopidogrel (PLAVIX) 75 MG tablet Take 1 tablet (75 mg total) by mouth daily with breakfast. 08/02/13  Yes Erlene Quan, PA-C  metoprolol succinate (TOPROL-XL) 50 MG 24 hr tablet Take 1 tablet (50 mg total) by mouth daily. 03/01/15  Yes Leonie Man, MD  pantoprazole (PROTONIX) 40 MG tablet Take 1 tablet (40 mg total) by mouth daily at 6 (six) AM. 08/02/13  Yes Erlene Quan, PA-C  valsartan-hydrochlorothiazide (DIOVAN HCT) 320-12.5 MG per tablet Take 1 tablet by mouth daily. 03/01/15  Yes Leonie Man, MD  acetaminophen (TYLENOL) 325 MG tablet Take 2 tablets (650 mg total) by mouth every 4 (four) hours as needed for headache or mild pain. 08/02/13   Erlene Quan, PA-C  ALPRAZolam Duanne Moron) 0.25 MG tablet Take 0.125 mg by mouth at bedtime as needed for sleep.    Historical Provider, MD  Ascorbic Acid (VITAMIN C) 1000 MG tablet Take 1,000 mg by mouth daily.    Historical Provider, MD  doxycycline (VIBRAMYCIN) 100 MG capsule Take 1 capsule (100 mg total) by mouth 2 (two) times daily. 03/21/15   Sherlene Shams, MD  nitroGLYCERIN (NITROSTAT) 0.4 MG SL tablet Place 1 tablet (0.4 mg total) under the tongue every 5 (five) minutes x 3 doses as needed for chest pain. 03/01/15   Leonie Man, MD  vitamin B-12 (CYANOCOBALAMIN) 1000 MCG tablet Take 1,000 mcg by mouth daily.    Historical  Provider, MD   BP 127/74 mmHg  Pulse 70  Temp(Src) 97.3 F (36.3 C) (Tympanic)  Resp 16  Ht 5\' 10"  (1.778 m)  Wt 214 lb (97.07 kg)  BMI 30.71 kg/m2  SpO2 98% Physical Exam  Skin:  Puncture wound L 2nd finger    ED Course  Procedures   MDM   1. Needlestick injury accident with exposure to body fluid    Labs drawn and will be managed per employee health protocols. Rx doxycycline given for wound prophylaxis.    Sherlene Shams, MD 03/25/15 [phone]

## 2015-03-21 NOTE — Discharge Instructions (Signed)
Jonathan Escobar should be in touch with you in the next 24 hours regarding further followup. Labs were drawn at the urgent care tonight. Prescription for doxycycline sent to pharmacy, because of exposure of punctured site to purulent matter.  Body Fluid Exposure Information People may come into contact with blood and other body fluids under various circumstances. In some cases, body fluids may contain germs (bacteria or viruses) that cause infections. These germs can be spread when another person's body fluids come into contact with your skin, mouth, eyes, or genitals.  Exposure to body fluids that may contain infectious material is a common problem for people providing care for others who are ill. It can occur when a person is performing health care tasks in the workplace or when taking care of a family member at home. Other common methods of exposure include injection drug use, sharing needles, and sexual activity. The risk of an infection spreading through body fluid exposure is small and depends on a variety of factors. This includes the type of body fluid, the nature of the exposure, and the health status of the person who was the source of the body fluids. Your health care provider can help you assess the risk. WHAT TYPES OF BODY FLUID CAN SPREAD INFECTION? The following types of body fluid have the potential to spread infections:  Blood.  Semen.  Vaginal secretions.  Urine.  Feces.  Saliva.  Nasal or eye discharge.  Breast milk.  Amniotic fluid and fluids surrounding body organs. WHAT ARE SOME FIRST-AID MEASURES FOR BODY FLUID EXPOSURE? The following steps should be taken as soon as possible after a person is exposed to body fluids: Intact Skin  For contact with closed skin, wash the area with soap and water. Broken Skin  For contact with broken skin (a wound), wash the area with soap and water. Let the area bleed a little. Then place a bandage or clean towel on  the wound, applying gentle pressure to stop the bleeding. Do not squeeze or rub the area.  Use just water or hand sanitizer if a sink with soap is not available.  Do not use harsh chemicals such as bleach or iodine. Eyes  Rinse the eyes with water or saline for 30 seconds.  If the person is wearing contact lenses, leave the contact lenses in while rinsing the eyes. Once the rinsing is complete, remove the contact lenses. Mouth  Spit out the fluids. Rinse and spit with water 4-5 times. In addition, you should remove any clothing that comes into contact with body fluids. However, if body fluid exposure results from sexual assault, seek medical care immediately without changing clothes or bathing. WHEN SHOULD YOU SEEK HELP? After performing the proper first-aid steps, you should contact your health care provider or seek emergency care right away if blood or other body fluids made contact with areas of broken skin or openings such as the eyes or mouth. If the exposure to body fluid happened in the workplace, you should report it to your work supervisor immediately. Many workplaces have procedures in place for exposure situations. WHAT WILL HAPPEN AFTER YOU REPORT THE EXPOSURE? Your health care provider will ask you several questions. Information requested may include:  Your medical history, including vaccination records.  Date and time of the exposure.  Whether you saw body fluids during the exposure.  Type of body fluid you were exposed to.  Volume of body fluid you were exposed to.  How the exposure happened.  If any devices, such as a needle, were being used.  Which area of your body made contact with the body fluid.  Description of any injury to the skin or other area.  How long contact was made with the body fluid.  Any information you have about the health status of the person whose body fluid you were exposed to. The health care provider will assess your risk of infection.  Often, no treatment is necessary. In some cases, the health care provider may recommend doing blood tests right away. Follow-up blood tests may also be done at certain intervals during the upcoming weeks and months to check for changes. You may be offered treatment to prevent an infection from developing after exposure (post-exposure prophylaxis). This may include certain vaccinations or medicines and may be necessary when there is a risk of a serious infection, such as HIV or hepatitis B. Your health care provider should discuss appropriate treatment and vaccinations with you. HOW CAN YOU PREVENT EXPOSURE AND INFECTION? Always remember that prevention is the first line of defense against body fluid exposure. To help prevent exposure to body fluids:  Wash and disinfect countertops and other surfaces regularly.  Wear appropriate protective gear such as gloves, gowns, or eyewear when the possibility of exposure is present.  Wipe away spills of body fluid with disposable towels.  Properly dispose of blood products and other fluids. Use secured bags.  Properly dispose of needles and other instruments with sharp points or edges (sharps). Use closed, marked containers.  Avoid injection drug use.  Do not share needles.  Avoid recapping needles.  Use a condom during sexual intercourse.  Make sure you learn and follow any guidelines for preventing exposure (universal precautions) provided at your workplace. To help reduce your chances of getting an infection:  Make sure your vaccinations are up-to-date, including those for tetanus and hepatitis.  Wash your hands frequently with soap and water. Use hand sanitizers.  Avoid having multiple sex partners.  Follow up with your health care provider as directed after being evaluated for an exposure to body fluids. To avoid spreading infection to others:  Do not have sexual relations until you know you are free of infection.  Do not donate blood,  plasma, breast milk, sperm, or other body fluids.  Do not share hygiene tools such as toothbrushes, razors, or dental floss.  Keep open wounds covered.  Dispose of any items with blood on them (razors, tampons, bandages) by putting them in the trash.  Do not share drug supplies with others, such as needles, syringes, straws, or pipes.  Follow all of your health care provider's instructions for preventing the spread of infection. Document Released: 04/20/2013 Document Revised: 08/23/2013 Document Reviewed: 04/20/2013 [facility] Patient Information 2015 [place], [place]. This information is not intended to replace advice given to you by your health care provider. Make sure you discuss any questions you have with your health care provider.

## 2015-03-21 NOTE — ED Notes (Signed)
PA working at [facility], was draining an abscess on patient leg and accidentally punctured left 2nd finger. Blood/labs drawn on [facility] patient prior to patient being discharged. Has small puncture to left 2nd finger

## 2015-08-10 ENCOUNTER — Telehealth: Payer: Self-pay

## 2015-08-10 NOTE — Telephone Encounter (Signed)
I called to r/s from Dr. Ellyn Hack clinic on 12/14, pt states he needs to see someone and/or get advise regarding his Plavix. Please advise  Request for surgical clearance:  1. What type of surgery is being performed? colonoscopy  2. When is this surgery scheduled? 12/28  3. Are there any medications that need to be held prior to surgery and how long? Plavix  4. Name of physician performing surgery? At [facility] in [place]  5. What is your office phone and fax number?  6.

## 2015-08-10 NOTE — Telephone Encounter (Signed)
S/w Dr. Fletcher Anon to advise regarding Plavix. Per verbal from Dr. Fletcher Anon, have pt hold Plavix 5 days prior to colonoscopy but continue aspirin as prescribed. S/w pt of Dr. Tyrell Antonio recommendations. He verbalized understanding and is appreciative of the call.

## 2015-08-15 ENCOUNTER — Ambulatory Visit: Payer: Worker's Compensation | Admitting: Cardiology

## 2015-09-18 DIAGNOSIS — K6389 Other specified diseases of intestine: Secondary | ICD-10-CM | POA: Diagnosis not present

## 2015-09-18 DIAGNOSIS — K5730 Diverticulosis of large intestine without perforation or abscess without bleeding: Secondary | ICD-10-CM | POA: Diagnosis not present

## 2015-10-08 DIAGNOSIS — Z85828 Personal history of other malignant neoplasm of skin: Secondary | ICD-10-CM | POA: Insufficient documentation

## 2015-10-10 ENCOUNTER — Ambulatory Visit (INDEPENDENT_AMBULATORY_CARE_PROVIDER_SITE_OTHER): Payer: 59 | Admitting: Cardiology

## 2015-10-10 ENCOUNTER — Encounter: Payer: Self-pay | Admitting: Cardiology

## 2015-10-10 VITALS — BP 124/80 | HR 82 | Ht 69.0 in | Wt 228.2 lb

## 2015-10-10 DIAGNOSIS — I2119 ST elevation (STEMI) myocardial infarction involving other coronary artery of inferior wall: Secondary | ICD-10-CM | POA: Diagnosis not present

## 2015-10-10 DIAGNOSIS — I10 Essential (primary) hypertension: Secondary | ICD-10-CM | POA: Diagnosis not present

## 2015-10-10 DIAGNOSIS — Z8719 Personal history of other diseases of the digestive system: Secondary | ICD-10-CM

## 2015-10-10 DIAGNOSIS — E669 Obesity, unspecified: Secondary | ICD-10-CM

## 2015-10-10 DIAGNOSIS — I2111 ST elevation (STEMI) myocardial infarction involving right coronary artery: Secondary | ICD-10-CM

## 2015-10-10 DIAGNOSIS — I25810 Atherosclerosis of coronary artery bypass graft(s) without angina pectoris: Secondary | ICD-10-CM | POA: Diagnosis not present

## 2015-10-10 DIAGNOSIS — E785 Hyperlipidemia, unspecified: Secondary | ICD-10-CM

## 2015-10-10 DIAGNOSIS — Z951 Presence of aortocoronary bypass graft: Secondary | ICD-10-CM

## 2015-10-10 DIAGNOSIS — I2510 Atherosclerotic heart disease of native coronary artery without angina pectoris: Secondary | ICD-10-CM

## 2015-10-10 MED ORDER — METOPROLOL SUCCINATE ER 50 MG PO TB24: 50.0000 mg | ORAL_TABLET | Freq: Every day | ORAL | Status: DC

## 2015-10-10 MED ORDER — VALSARTAN 320 MG PO TABS: 320.0000 mg | ORAL_TABLET | Freq: Every day | ORAL | Status: DC

## 2015-10-10 MED ORDER — AMLODIPINE BESYLATE 5 MG PO TABS: 2.5000 mg | ORAL_TABLET | Freq: Every day | ORAL | Status: DC

## 2015-10-10 NOTE — Assessment & Plan Note (Signed)
He is actually doing fairly well after his second STEMI. -- Initial revascularization was CABG then extensive stents to the RCA as well as SVG to diagonal. No further anginal symptoms or heart failure symptoms. Relatively preserved EF by echo and LV gram. He did have a moderate infarct noted on Myoview but no evidence of ischemia. On stable regimen.

## 2015-10-10 NOTE — Assessment & Plan Note (Signed)
Relatively well-controlled on current regimen. No change.

## 2015-10-10 NOTE — Progress Notes (Signed)
PCP: Halina Maidens, MD  Clinic Note: Chief Complaint  Patient presents with  . other    6 month follow up. Meds reviewed by the patient verbally. "doing well."   . Coronary Artery Disease    HPI: Jonathan Escobar is a 70 y.o. 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  Initial cardiac event was in 1999 with bare-metal stent to the proximal RCA -> had brachytherapy in 2000 and 2003 then redo PCI with a Taxus DES in 2005 for ISR.  Inferior STEMI 04/25/2012 - 95% ISR followed by 5% stenosis post stent with lesions in the PL and PDA. Also noted 70% mid LAD; after aspiration thrombectomy, balloon angioplasty performed and he was sent for urgent catheterization after stabilization on Integrelin (LIMA-LAD, SVG-PDA and PLA, SVG-Diag)   Inferior STEMI in November 2014 with occluded SVG-PDA --> extensive PCI (full metal jacket) of SVG followed by staged PCI of anastomotic SVG-diagonal lesion.   AWAB ABEBE was last seen in June 2016.  Recent Hospitalizations: No hospitalizations.  Studies Reviewed: No new studies  Interval History: Jonathan Escobar presents today doing well without any major complaints. His only concern is bruising from his recent Moh's Procedure on his forehead. Otherwise he is doing quite well from a cardiac standpoint. He has mild swelling in his legs at the end of the day that is pretty well gone the next morning. He stopped taking his HCTZ because of cramping, and started amlodipine. This may be one reason. Otherwise he is relatively active, but is not doing any routine exercise. With him activities doing, he is relatively asymptomatic from cardiac standpoint: Cardiac review of symptoms:    No chest pain or shortness of breath with rest or exertion.   No PND, orthopneawith trace in the day edema.   No palpitations, lightheadedness, dizziness, weakness or syncope/near syncope.  No TIA/amaurosis  fugax symptoms.  No claudication.  ROS: A comprehensive was performed. Review of Systems  Constitutional: Negative for malaise/fatigue and diaphoresis.  HENT: Negative for hearing loss and nosebleeds.   Respiratory: Negative for cough, shortness of breath and wheezing.   Cardiovascular: Negative for claudication.  Gastrointestinal: Positive for heartburn. Negative for blood in stool and melena.  Genitourinary: Negative for hematuria.  Musculoskeletal: Positive for joint pain.  Neurological: Negative.  Negative for weakness and headaches.  Endo/Heme/Allergies: Does not bruise/bleed easily.  Psychiatric/Behavioral: Negative.   All other systems reviewed and are negative.   Past Medical History  Diagnosis Date  . CAD S/P percutaneous coronary angioplasty 1999    BMS to prox RCA 1999; Brachytherapy ~2003, followed by DES  PCI (Taxus) for ISR.  Marland Kitchen Coronary stent restenosis due to progression of disease 2000; 2005    (While Still In [place]) Status post Brachytherapy to RCA ISR - 2000; Redo PCI with DES 2005  . History of: ST elevation myocardial infarction (STEMI) of inferior wall, subsequent episode of care April 25, 2012;    RCA: 95% ISR followed by 100% stenosis post-stent with significant RPL lesions; LAD: Tandem 80 and 70% lesions involving D1 (1, 1, 1) --> initial angioplasty to RCA aspiration thrombectomy --> urgent CABG;  . S/P CABG x 4: LIMA-LAD, seq SVG-PD-PLA; SVG-diag. 04/28/2012  . ST elevation myocardial infarction (STEMI) of inferior wall, subsequent episode of care [facility])  Jul 29, 2013    Diffuse near occlusion of SVG-rPDA, 90% SVG-Diag  . CAD (coronary artery disease), autologous  vein bypass graft Nov 2014    Inf STEMI - PCI to SVG-RPDA:   . S/P coronary artery stent placement Nov-Dec 2014    a) Inf STEMIL 11/28/'14 --> extensive (full metal Jacket) of SVG-RPDA Promus DES - 3 mm x 38 mm, 3 mm x 38 mm, 3 mm x 20 mm, 2.75 mm x 38 mm -- all postdilated to ~3.3 mm;; b)  12/1/'14: staged PCI to distal SVG-diagonal: Xience Xpedition 2.5 mm x 20 mm (2.66 mm)  . Hyperlipidemia LDL goal <70   . Essential hypertension   . Obesity (BMI 30.0-34.9)   . History of GI bleed      while on Plavix  . History of viral meningitis     following GI bleed     Past Surgical History  Procedure Laterality Date  . Cardiac catheterization  04/28/12    RCA: 95% ISR followed by 100% stenosis post-stent with significant RPL lesions; LAD: Tandem 80 and 70% lesions involving D1 (1, 1, 1) --> initial angioplasty to RCA aspiration thrombectomy --> urgent CABG;  . Coronary artery bypass graft  04/28/2012    Procedure: CORONARY ARTERY BYPASS GRAFTING (CABG);  Surgeon: Gaye Pollack, MD;  Location: [facility];  Service: Open Heart Surgery;  Laterality: N/A;  . Transthoracic echocardiogram  12/'13; 07/30/2013    a) Post CABG: EF 50-55%, moderate HK of anteroseptal wall, septal dyskinesis/dyssynergy due to poststernotomy;  grade 1 diastolic dysfunction. Mildly dilated LA, mildly reduced RV function-likely due to septal dyssynergy.; b) 11/'14 Post Inferior STEMI: EF 50-55% mild LVH. Mild hypokinesis of the distal lateral, and basal to mid inferior lateral and inferior walls -- consistent with RCA infarct  . Left and right heart catheterization with coronary angiogram N/A 04/25/2012    Procedure: LEFT AND RIGHT HEART CATHETERIZATION WITH CORONARY ANGIOGRAM;  Surgeon: Leonie Man, MD;  Location[facility] C[facility] LAB;  Service: Cardiovascular;  Laterality: N/A;  . Left heart catheterization with coronary/graft angiogram  07/29/2013    Procedure: LEFT HEART CATHETERIZATION WITH Beatrix Fetters;  Surgeon: Blane Ohara, MD;  Location: [facility] CATH LAB;  Service: Cardiovascular; ; 95% stenosis with percent mid occlusion of SVG-RPDA; 90% anastomotic SVG-diagonal, moderate 50% disease in the native circumflex with patent OM 1 OM 2. 80-90% proximal LAD with patent LIMA to LAD , EF 50%  . Percutaneous coronary  stent intervention (pci-s)  07/29/2013    Procedure: PERCUTANEOUS CORONARY STENT INTERVENTION (PCI-S);  Surgeon: Blane Ohara, MD;  Location: [facility] CATH LAB;  Service: Cardiovascular;; PCI to SVG-RPDA: Promus DES - 3 mm x 38 mm, 3 mm x 38 mm, 3 mm x 20 mm, 2.75 mm x 38 mm -- all postdilated to ~3.3 mm;  . Percutaneous coronary stent intervention (pci-s) N/A 08/01/2013    Procedure: PERCUTANEOUS CORONARY STENT INTERVENTION (PCI-S);  Surgeon: Leonie Man, MD;  Location: [facility] CATH LAB;  Service: Cardiovascular;staged PCI to distal SVG-diagonal: Xience Xpedition 2.5 mm x 20 mm (2.66 mm)    . Nm myoview ltd  Jan 2015    Moderate Inferior infarct (as expected), but no ischemia & EF ~45-50%  . Hernia repair    . Mohs surgery     Prior to Admission medications   Medication Sig Start Date End Date Taking? Authorizing Provider  acetaminophen (TYLENOL) 325 MG tablet Take 2 tablets (650 mg total) by mouth every 4 (four) hours as needed for headache or mild pain. 08/02/13  Yes Luke K Kilroy, PA-C  ALPRAZolam Duanne Moron) 0.25 MG tablet Take  0.125 mg by mouth at bedtime as needed for sleep.   Yes Historical Provider, MD  amLODipine (NORVASC) 5 MG tablet Take 0.5 tablets (2.5 mg total) by mouth daily. 10/10/15  Yes Leonie Man, MD  Ascorbic Acid (VITAMIN C) 1000 MG tablet Take 1,000 mg by mouth daily.   Yes Historical Provider, MD  aspirin 81 MG tablet Take 81 mg by mouth daily.   Yes Historical Provider, MD  atorvastatin (LIPITOR) 20 MG tablet Take 1 tablet (20 mg total) by mouth daily at 6 PM. 03/01/15  Yes Leonie Man, MD  cetirizine (ZYRTEC) 10 MG tablet Take 10 mg by mouth daily.   Yes Historical Provider, MD  clopidogrel (PLAVIX) 75 MG tablet Take 1 tablet (75 mg total) by mouth daily with breakfast. 08/02/13  Yes Erlene Quan, PA-C  doxycycline (VIBRAMYCIN) 100 MG capsule Take 1 capsule (100 mg total) by mouth 2 (two) times daily. 03/21/15  Yes Sherlene Shams, MD  metoprolol succinate (TOPROL-XL) 50 MG  24 hr tablet Take 1 tablet (50 mg total) by mouth daily. 10/10/15  Yes Leonie Man, MD  nitroGLYCERIN (NITROSTAT) 0.4 MG SL tablet Place 1 tablet (0.4 mg total) under the tongue every 5 (five) minutes x 3 doses as needed for chest pain. 03/01/15  Yes Leonie Man, MD  pantoprazole (PROTONIX) 40 MG tablet Take 1 tablet (40 mg total) by mouth daily at 6 (six) AM. 08/02/13  Yes Erlene Quan, PA-C  valsartan (DIOVAN) 320 MG tablet Take 1 tablet (320 mg total) by mouth daily. 10/10/15  Yes Leonie Man, MD  vitamin B-12 (CYANOCOBALAMIN) 1000 MCG tablet Take 1,000 mcg by mouth daily.   Yes Historical Provider, MD   Allergies  Allergen Reactions  . Ace Inhibitors Other (See Comments)    Cough   . Norvasc [Amlodipine Besylate]     Dizzy  . Amoxicillin Rash    Flu like symptoms    Social History   Social History  . Marital Status: Married    Spouse Name: N/A  . Number of Children: N/A  . Years of Education: N/A   Social History Main Topics  . Smoking status: Former Smoker -- 1.00 packs/day for 10 years    Types: Cigarettes    Quit date: 09/01/1976  . Smokeless tobacco: Never Used  . Alcohol Use: 8.4 oz/week    14 Glasses of wine per week  . Drug Use: No  . Sexual Activity: Yes   Other Topics Concern  . None   Social History Narrative   He is a married father of 4, grandfather and 2.   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   He completed cardiac rehabilitation but as [name] got out of the habit of exercising.   Family History  Problem Relation Age of Onset  . Alzheimer's disease Mother   . Heart failure Father   . Coronary artery disease Father   . Valvular heart disease Sister     Wt Readings from Last 3 Encounters:  10/10/15 228 lb 4 oz  (103.534 kg)  03/21/15 214 lb (97.07 kg)  03/01/15 221 lb (100.245 kg)    PHYSICAL EXAM BP 124/80 mmHg  Pulse 82  Ht '5\' 9"'  (1.753 m)  Wt  228 lb 4 oz (103.534 kg)  BMI 33.69 kg/m2 General appearance: alert, cooperative, appears stated age, no distress and mildly obese  HEENT: [place]/AT, EOMI, MMM, anicteric sclera -- dressing on mid forehead (from Moh's procedure) c/d/i Neck: no adenopathy, no carotid bruit and no JVD  Lungs: clear to auscultation bilaterally, normal percussion bilaterally and non-labored  Heart: RRR, S1, S2 normal, no murmur, click, rub or gallop; nondisplaced PMI  Abdomen: soft, non-tender; bowel sounds normal; no masses, no organomegaly; mild truncal obesity  Extremities: extremities normal, atraumatic, no cyanosis or edema  Pulses: 2+ and symmetric;  Skin: normal  Neurologic: Mental status: Alert, oriented, thought content appropriate  Cranial nerves: normal (II-XII grossly intact)   Adult ECG Report  Rate: 82 ;  Rhythm: normal sinus rhythm and Left Anterior Fascicular Block vs. Left Axis Deviation with possible inferior MI, age undetermined  (-55), otherwise normal axis, durations and intervals;   Narrative Interpretation: Stable EKG  Other studies Reviewed: Additional studies/ records that were reviewed today include:  Recent Labs:  None since 2015   ASSESSMENT / PLAN: Problem List Items Addressed This Visit    STEMI 07/29/13- successful complex full metal jacket stenting of the SVG-PDA/PLA -07/29/2013, staged SVG-Dx PCI 12/01 (Chronic)    He is actually doing fairly well after his second STEMI. -- Initial revascularization was CABG then extensive stents to the RCA as well as SVG to diagonal. No further anginal symptoms or heart failure symptoms. Relatively preserved EF by echo and LV gram. He did have a moderate infarct noted on Myoview but no evidence of ischemia. On stable regimen.      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 (Chronic)    Essentially he has had PCI on both vein grafts with patent LIMA.      Obesity (BMI 30-39.9) (Chronic)    The patient understands the need to lose weight with diet and exercise. We have discussed specific strategies for this.      Hyperlipidemia LDL goal <70 (Chronic)    He is on moderate dose statin. Is due for lipid check. We will order lipid profile along with CMP and CBC.      Relevant Medications   valsartan (DIOVAN) 320 MG tablet   metoprolol succinate (TOPROL-XL) 50 MG 24 hr tablet   amLODipine (NORVASC) 5 MG tablet   H/O: GI bleed - 2007 on Plavix (Chronic)    With his lab check for statin, we will check a CBC to exclude any recurrent occult anemia.      Essential hypertension (Chronic)    Relatively well-controlled on current regimen. No change.      Relevant Medications   valsartan (DIOVAN) 320 MG tablet   metoprolol succinate (TOPROL-XL) 50 MG 24 hr tablet   amLODipine (NORVASC) 5 MG tablet   Other Relevant Orders   EKG 12-Lead (Completed)   CBC   Comp Met (CMET)   Lipid Profile   CAD in native artery - prior Inferior MI -  RCA PCI; 04/2012 - Inferior STEMI - RCA occluded (unable to dilate lesion adequately) + LAD & D1 disease --> referred for CABG (Chronic)    Continue aggressive risk factor modification. He remains on statin, beta blocker, ARB and now calcium channel blocker. He is on aspirin plus Plavix. We talked about potential stress test in follow-up, I think this would probably be more  prudent next year.      Relevant Medications   valsartan (DIOVAN) 320 MG tablet   metoprolol succinate (TOPROL-XL) 50 MG 24 hr tablet   amLODipine (NORVASC) 5 MG tablet   CAD (coronary artery disease), autologous vein bypass graft --> full metal jacket PCI of SVG-RCA; PCI of SVG-D1 anastomosis - Primary (Chronic)    Multiple stents in the vein graft to the right as well as stent in the diagonal graft to the diagonal itself. Would continue on aspirin and Plavix unless her sign of bleeding. We can probably stop aspirin if he has any bruising or bleeding issues. Would continue Plavix essentially lifelong. Otherwise on statin beta blocker, ARB and calcium channel blocker.      Relevant Medications   valsartan (DIOVAN) 320 MG tablet   metoprolol succinate (TOPROL-XL) 50 MG 24 hr tablet   amLODipine (NORVASC) 5 MG tablet   Other Relevant Orders   EKG 12-Lead (Completed)   CBC   Comp Met (CMET)   Lipid Profile      Current medicines are reviewed at length with the patient today. (+/- concerns) was having issues with HCTZ. Converted back to amlodipine. The following changes have been made: Refilling separate medications valsartan and amlodipine along with metoprolol  Studies Ordered:   Orders Placed This Encounter  Procedures  . CBC  . Comp Met (CMET)  . Lipid Profile  . EKG 12-Lead      Leonie Man, M.D., M.S. Interventional Cardiologist   Pager # [phone] Phone # [phone] [address]. [place], [place] [address]

## 2015-10-10 NOTE — Assessment & Plan Note (Signed)
Essentially he has had PCI on both vein grafts with patent LIMA.

## 2015-10-10 NOTE — Assessment & Plan Note (Signed)
He is on moderate dose statin. Is due for lipid check. We will order lipid profile along with CMP and CBC.

## 2015-10-10 NOTE — Assessment & Plan Note (Signed)
Continue aggressive risk factor modification. He remains on statin, beta blocker, ARB and now calcium channel blocker. He is on aspirin plus Plavix. We talked about potential stress test in follow-up, I think this would probably be more prudent next year.

## 2015-10-10 NOTE — Assessment & Plan Note (Signed)
The patient understands the need to lose weight with diet and exercise. We have discussed specific strategies for this.  

## 2015-10-10 NOTE — Patient Instructions (Signed)
Medication Instructions:  Your physician recommends that you continue on your current medications as directed. Please refer to the Current Medication list given to you today.   Labwork: CBC, CMP, fasting lipids. Nothing to eat or drink after midnight the evening before your labs  Testing/Procedures: none  Follow-Up: Your physician wants you to follow-up in: six months with Dr. Ellyn Hack.  You will receive a reminder letter in the mail two months in advance. If you don't receive a letter, please call our office to schedule the follow-up appointment.   Any Other Special Instructions Will Be Listed Below (If Applicable).     If you need a refill on your cardiac medications before your next appointment, please call your pharmacy.

## 2015-10-10 NOTE — Assessment & Plan Note (Signed)
With his lab check for statin, we will check a CBC to exclude any recurrent occult anemia.

## 2015-10-10 NOTE — Assessment & Plan Note (Signed)
Multiple stents in the vein graft to the right as well as stent in the diagonal graft to the diagonal itself. Would continue on aspirin and Plavix unless her sign of bleeding. We can probably stop aspirin if he has any bruising or bleeding issues. Would continue Plavix essentially lifelong. Otherwise on statin beta blocker, ARB and calcium channel blocker.

## 2015-11-14 ENCOUNTER — Other Ambulatory Visit: Payer: Self-pay

## 2015-11-14 DIAGNOSIS — E785 Hyperlipidemia, unspecified: Secondary | ICD-10-CM

## 2015-11-14 DIAGNOSIS — I10 Essential (primary) hypertension: Secondary | ICD-10-CM

## 2015-11-19 ENCOUNTER — Other Ambulatory Visit: Payer: Self-pay

## 2015-11-21 DIAGNOSIS — H524 Presbyopia: Secondary | ICD-10-CM | POA: Diagnosis not present

## 2015-11-22 ENCOUNTER — Other Ambulatory Visit: Payer: Self-pay | Admitting: Cardiology

## 2015-11-22 MED ORDER — AMLODIPINE BESYLATE 5 MG PO TABS: 2.5000 mg | ORAL_TABLET | Freq: Every day | ORAL | Status: DC

## 2015-11-22 MED ORDER — VALSARTAN 320 MG PO TABS: 320.0000 mg | ORAL_TABLET | Freq: Every day | ORAL | Status: DC

## 2015-11-22 MED ORDER — METOPROLOL SUCCINATE ER 50 MG PO TB24: 50.0000 mg | ORAL_TABLET | Freq: Every day | ORAL | Status: DC

## 2015-11-22 NOTE — Telephone Encounter (Signed)
°*  STAT* If patient is at the pharmacy, call can be transferred to refill team.   1. Which medications need to be refilled? (please list name of each medication and dose if known) Amlodopine, Diovan , and Metoprolol  2. Which pharmacy/location (including street and city if local pharmacy) is medication to be sent to?[facility]   3. Do they need a 30 day or 90 day supply? [place]

## 2015-11-22 NOTE — Telephone Encounter (Signed)
,  Rx(s) sent to pharmacy electronically.  

## 2015-12-12 ENCOUNTER — Other Ambulatory Visit (INDEPENDENT_AMBULATORY_CARE_PROVIDER_SITE_OTHER): Payer: 59 | Admitting: *Deleted

## 2015-12-12 DIAGNOSIS — E785 Hyperlipidemia, unspecified: Secondary | ICD-10-CM | POA: Diagnosis not present

## 2015-12-12 DIAGNOSIS — I25810 Atherosclerosis of coronary artery bypass graft(s) without angina pectoris: Secondary | ICD-10-CM | POA: Diagnosis not present

## 2015-12-12 DIAGNOSIS — I10 Essential (primary) hypertension: Secondary | ICD-10-CM | POA: Diagnosis not present

## 2015-12-13 LAB — COMPREHENSIVE METABOLIC PANEL
A/G RATIO: 1.4 (ref 1.2–2.2)
ALK PHOS: 66 IU/L (ref 39–117)
ALT: 25 IU/L (ref 0–44)
AST: 23 IU/L (ref 0–40)
Albumin: 4 g/dL (ref 3.6–4.8)
BUN/Creatinine Ratio: 13 (ref 10–24)
BUN: 12 mg/dL (ref 8–27)
Bilirubin Total: 0.7 mg/dL (ref 0.0–1.2)
CHLORIDE: 101 mmol/L (ref 96–106)
CO2: 20 mmol/L (ref 18–29)
Calcium: 9.2 mg/dL (ref 8.6–10.2)
Creatinine, Ser: 0.95 mg/dL (ref 0.76–1.27)
GFR calc Af Amer: 94 mL/min/{1.73_m2} (ref >59)
GFR calc non Af Amer: 81 mL/min/{1.73_m2} (ref >59)
GLOBULIN, TOTAL: 2.8 g/dL (ref 1.5–4.5)
Glucose: 103 mg/dL — ABNORMAL HIGH (ref 65–99)
POTASSIUM: 4.1 mmol/L (ref 3.5–5.2)
SODIUM: 139 mmol/L (ref 134–144)
Total Protein: 6.8 g/dL (ref 6.0–8.5)

## 2015-12-13 LAB — LIPID PANEL
CHOL/HDL RATIO: 1.5 ratio (ref 0.0–5.0)
Cholesterol, Total: 141 mg/dL (ref 100–199)
HDL: 93 mg/dL (ref >39)
LDL CALC: 29 mg/dL (ref 0–99)
TRIGLYCERIDES: 94 mg/dL (ref 0–149)
VLDL Cholesterol Cal: 19 mg/dL (ref 5–40)

## 2015-12-13 LAB — CBC
Hematocrit: 39.9 % (ref 37.5–51.0)
Hemoglobin: 13.6 g/dL (ref 12.6–17.7)
MCH: 30.3 pg (ref 26.6–33.0)
MCHC: 34.1 g/dL (ref 31.5–35.7)
MCV: 89 fL (ref 79–97)
PLATELETS: 230 10*3/uL (ref 150–379)
RBC: 4.49 x10E6/uL (ref 4.14–5.80)
RDW: 13.1 % (ref 12.3–15.4)
WBC: 4.7 10*3/uL (ref 3.4–10.8)

## 2016-01-03 DIAGNOSIS — Z85828 Personal history of other malignant neoplasm of skin: Secondary | ICD-10-CM | POA: Diagnosis not present

## 2016-01-03 DIAGNOSIS — L570 Actinic keratosis: Secondary | ICD-10-CM | POA: Diagnosis not present

## 2016-03-26 DIAGNOSIS — E785 Hyperlipidemia, unspecified: Secondary | ICD-10-CM | POA: Diagnosis not present

## 2016-03-26 DIAGNOSIS — I2510 Atherosclerotic heart disease of native coronary artery without angina pectoris: Secondary | ICD-10-CM | POA: Diagnosis not present

## 2016-03-26 DIAGNOSIS — I10 Essential (primary) hypertension: Secondary | ICD-10-CM | POA: Diagnosis not present

## 2016-04-21 ENCOUNTER — Other Ambulatory Visit: Payer: Self-pay | Admitting: Cardiology

## 2016-04-21 NOTE — Telephone Encounter (Signed)
Rx request sent to pharmacy.  

## 2016-06-30 ENCOUNTER — Ambulatory Visit: Payer: Self-pay | Admitting: General Surgery

## 2016-07-01 ENCOUNTER — Other Ambulatory Visit: Payer: Self-pay | Admitting: Cardiology

## 2016-07-02 ENCOUNTER — Encounter: Payer: Self-pay | Admitting: General Surgery

## 2016-07-02 ENCOUNTER — Telehealth: Payer: Self-pay

## 2016-07-02 ENCOUNTER — Ambulatory Visit (INDEPENDENT_AMBULATORY_CARE_PROVIDER_SITE_OTHER): Payer: 59 | Admitting: General Surgery

## 2016-07-02 VITALS — BP 160/94 | HR 55 | Temp 97.8°F | Ht 69.0 in | Wt 206.0 lb

## 2016-07-02 DIAGNOSIS — K4090 Unilateral inguinal hernia, without obstruction or gangrene, not specified as recurrent: Secondary | ICD-10-CM

## 2016-07-02 HISTORY — DX: Unilateral inguinal hernia, without obstruction or gangrene, not specified as recurrent: K40.90

## 2016-07-02 NOTE — Telephone Encounter (Signed)
Cardiac Clearance and anti-Coagulant faxed to Dr.Arida Muhammad at this time.

## 2016-07-02 NOTE — Patient Instructions (Signed)
Please call when you are ready to set up your surgery. Please call our office if you have questions or concerns.

## 2016-07-02 NOTE — Progress Notes (Signed)
Patient ID: Jonathan Escobar, male   DOB: 01/15/1946, 70 y.o.   MRN: [id number]  CC: Right groin bulge  HPI Jonathan Escobar is a 70 y.o. male who presents to clinic today for evaluation of a right groin bulge. Patient states he first noticed it about a month ago after a hard day at work and then again after lifting something heavy at home. He states there is discomfort to the area when it is out immediately reduces when he rests or lays back. Jonathan Escobar is out as a dull achy pain. It does not radiate and does not move. He denies any fevers, chills, nausea, vomiting, chest pain, shortness of breath, diarrhea, constipation. He states it feels very similar to the hernia from his left side that was repaired greater than 30 years ago.  HPI  Past Medical History:  Diagnosis Date  . CAD (coronary artery disease), autologous vein bypass graft Nov 2014   Inf STEMI - PCI to SVG-RPDA:   . CAD S/P percutaneous coronary angioplasty 1999   BMS to prox RCA 1999; Brachytherapy ~2003, followed by DES  PCI ([facility]) for ISR.  Marland Kitchen Coronary stent restenosis due to progression of disease 2000; 2005   (While Still In [place]) Status post Brachytherapy to RCA ISR - 2000; Redo PCI with DES 2005  . Essential hypertension   . History of GI bleed     while on Plavix  . History of viral meningitis    following GI bleed   . History of: ST elevation myocardial infarction (STEMI) of inferior wall, subsequent episode of care April 25, 2012;   RCA: 95% ISR followed by 100% stenosis post-stent with significant RPL lesions; LAD: Tandem 80 and 70% lesions involving D1 (1, 1, 1) --> initial angioplasty to RCA aspiration thrombectomy --> urgent CABG;  . Hyperlipidemia LDL goal <70   . Obesity (BMI 30.0-34.9)   . S/P CABG x 4: LIMA-LAD, seq SVG-PD-PLA; SVG-diag. 04/28/2012  . S/P coronary artery stent placement Nov-Dec 2014   a) Inf STEMIL 11/28/'14 --> extensive (full metal Jacket) of SVG-RPDA Promus DES - 3 mm x 38 mm, 3 mm x 38 mm,  3 mm x 20 mm, 2.75 mm x 38 mm -- all postdilated to ~3.3 mm;; b) 12/1/'14: staged PCI to distal SVG-diagonal: Xience Xpedition 2.5 mm x 20 mm (2.66 mm)  . ST elevation myocardial infarction (STEMI) of inferior wall, subsequent episode of care [facility])  Jul 29, 2013   Diffuse near occlusion of SVG-rPDA, 90% SVG-Diag    Past Surgical History:  Procedure Laterality Date  . CARDIAC CATHETERIZATION  04/28/12   RCA: 95% ISR followed by 100% stenosis post-stent with significant RPL lesions; LAD: Tandem 80 and 70% lesions involving D1 (1, 1, 1) --> initial angioplasty to RCA aspiration thrombectomy --> urgent CABG;  . CORONARY ARTERY BYPASS GRAFT  04/28/2012   Procedure: CORONARY ARTERY BYPASS GRAFTING (CABG);  Surgeon: Gaye Pollack, MD;  Location: [place];  Service: Open Heart Surgery;  Laterality: N/A;  . HERNIA REPAIR    . LEFT AND RIGHT HEART CATHETERIZATION WITH CORONARY ANGIOGRAM N/A 04/25/2012   Procedure: LEFT AND RIGHT HEART CATHETERIZATION WITH CORONARY ANGIOGRAM;  Surgeon: Leonie Man, MD;  Location: [facility] CATH LAB;  Service: Cardiovascular;  Laterality: N/A;  . LEFT HEART CATHETERIZATION WITH CORONARY/GRAFT ANGIOGRAM  07/29/2013   Procedure: LEFT HEART CATHETERIZATION WITH Beatrix Fetters;  Surgeon: Blane Ohara, MD;  Location: [facility] CATH LAB;  Service: Cardiovascular; ; 95% stenosis with percent mid  occlusion of SVG-RPDA; 90% anastomotic SVG-diagonal, moderate 50% disease in the native circumflex with patent OM 1 OM 2. 80-90% proximal LAD with patent LIMA to LAD , EF 50%  . MOHS SURGERY    . NM MYOVIEW LTD  Jan 2015   Moderate Inferior infarct (as expected), but no ischemia & EF ~45-50%  . PERCUTANEOUS CORONARY STENT INTERVENTION (PCI-S)  07/29/2013   Procedure: PERCUTANEOUS CORONARY STENT INTERVENTION (PCI-S);  Surgeon: Blane Ohara, MD;  Location: [facility] CATH LAB;  Service: Cardiovascular;; PCI to SVG-RPDA: Promus DES - 3 mm x 38 mm, 3 mm x 38 mm, 3 mm x 20 mm, 2.75 mm x 38 mm -- all  postdilated to ~3.3 mm;  . PERCUTANEOUS CORONARY STENT INTERVENTION (PCI-S) N/A 08/01/2013   Procedure: PERCUTANEOUS CORONARY STENT INTERVENTION (PCI-S);  Surgeon: Leonie Man, MD;  Location: [facility] CATH LAB;  Service: Cardiovascular;staged PCI to distal SVG-diagonal: Xience Xpedition 2.5 mm x 20 mm (2.66 mm)    . TRANSTHORACIC ECHOCARDIOGRAM  12/'13; 07/30/2013   a) Post CABG: EF 50-55%, moderate HK of anteroseptal wall, septal dyskinesis/dyssynergy due to poststernotomy;  grade 1 diastolic dysfunction. Mildly dilated LA, mildly reduced RV function-likely due to septal dyssynergy.; b) 11/'14 Post Inferior STEMI: EF 50-55% mild LVH. Mild hypokinesis of the distal lateral, and basal to mid inferior lateral and inferior walls -- consistent with RCA infarct    Family History  Problem Relation Age of Onset  . Alzheimer's disease Mother   . Heart failure Father   . Coronary artery disease Father   . Valvular heart disease Sister     Social History Social History  Substance Use Topics  . Smoking status: Former Smoker    Packs/day: 1.00    Years: 10.00    Types: Cigarettes    Quit date: 09/01/1976  . Smokeless tobacco: Never Used  . Alcohol use 8.4 oz/week    14 Glasses of wine per week    Allergies  Allergen Reactions  . Ace Inhibitors Other (See Comments)    Cough   . Norvasc [Amlodipine Besylate]     Dizzy  . Amoxicillin Rash    Flu like symptoms    Current Outpatient Prescriptions  Medication Sig Dispense Refill  . acetaminophen (TYLENOL) 325 MG tablet Take 2 tablets (650 mg total) by mouth every 4 (four) hours as needed for headache or mild pain.    Marland Kitchen ALPRAZolam (XANAX) 0.25 MG tablet Take 0.125 mg by mouth at bedtime as needed for sleep.    . Ascorbic Acid (VITAMIN C) 1000 MG tablet Take 1,000 mg by mouth daily.    Marland Kitchen aspirin 81 MG tablet Take 81 mg by mouth daily.    Marland Kitchen atorvastatin (LIPITOR) 20 MG tablet Take 1 tablet (20 mg total) by mouth daily at 6 PM. 90 tablet 0  .  cetirizine (ZYRTEC) 10 MG tablet Take 10 mg by mouth daily.    . clopidogrel (PLAVIX) 75 MG tablet Take 1 tablet (75 mg total) by mouth daily with breakfast. 90 tablet 3  . metoprolol succinate (TOPROL-XL) 50 MG 24 hr tablet Take 1 tablet (50 mg total) by mouth daily. 90 tablet 3  . nitroGLYCERIN (NITROSTAT) 0.4 MG SL tablet Place 1 tablet (0.4 mg total) under the tongue every 5 (five) minutes x 3 doses as needed for chest pain. 25 tablet 3  . pantoprazole (PROTONIX) 40 MG tablet Take 1 tablet (40 mg total) by mouth daily at 6 (six) AM. 90 tablet 3  . valsartan (  DIOVAN) 320 MG tablet Take 1 tablet (320 mg total) by mouth daily. 90 tablet 3   No current facility-administered medications for this visit.      Review of Systems A Multi-point review of systems was asked and was negative except for the findings documented in the history of present illness  Physical Exam Blood pressure (!) 160/94, pulse (!) 55, temperature 97.8 F (36.6 C), temperature source Oral, height 5\' 9"  (1.753 m), weight 93.4 kg (206 lb). CONSTITUTIONAL: No acute distress. EYES: Pupils are equal, round, and reactive to light, Sclera are non-icteric. EARS, NOSE, MOUTH AND THROAT: The oropharynx is clear. The oral mucosa is pink and moist. Hearing is intact to voice. LYMPH NODES:  Lymph nodes in the neck are normal. RESPIRATORY:  Lungs are clear. There is normal respiratory effort, with equal breath sounds bilaterally, and without pathologic use of accessory muscles. CARDIOVASCULAR: Heart is regular without murmurs, gallops, or rubs. GI: The abdomen is soft, nontender, and nondistended. There is a palpable right inguinal hernia on exam that is easily reducible but will propel with Valsalva. Left side has a well-healed scar from an open repair without any evidence of impulse or fascial defect. There is no hepatosplenomegaly. There are normal bowel sounds in all quadrants. GU: Rectal deferred.   MUSCULOSKELETAL: Normal muscle  strength and tone. No cyanosis or edema.   SKIN: Turgor is good and there are no pathologic skin lesions or ulcers. NEUROLOGIC: Motor and sensation is grossly normal. Cranial nerves are grossly intact. PSYCH:  Oriented to person, place and time. Affect is normal.  Data Reviewed There are no images or labs to review for this encounter I have personally reviewed the patient's imaging, laboratory findings and medical records.    Assessment    Right inguinal hernia    Plan    70 year old male with a right inguinal hernia on exam today. Discussed at length the signs and symptoms of incarceration or strangulation and to seek medical care immediately should they occur. Otherwise discussed elective open versus laparoscopic inguinal hernia repairs. Discussed both of them at length and the patient voiced understanding. After this conversation he states he would like to have an open right inguinal hernia repair. However, patient states he needs to coordinate time off from work prior to any operative intervention. Also, patient will need cardiac clearance and anticoagulation stoppage clearance prior to surgery. This forms were sent to his cardiologist today. Patient will call back when he is ready to schedule surgery which will likely be done before the end of the year.     Time spent with the patient was 45 minutes, with more than 50% of the time spent in face-to-face education, counseling and care coordination.     Clayburn Pert, MD FACS General Surgeon 07/02/2016, 12:00 PM

## 2016-07-11 ENCOUNTER — Telehealth: Payer: Self-pay | Admitting: General Surgery

## 2016-07-11 NOTE — Telephone Encounter (Signed)
Pt's wife has been advised of pre op date/time and sx date. Sx: 08/06/16 with Dr Nira Conn Location--right open inguinal hernia repair.  Pre op: Patient will be called 1 week prior to surgery for pre admit from [place].

## 2016-07-29 ENCOUNTER — Emergency Department
Admission: EM | Admit: 2016-07-29 | Discharge: 2016-07-29 | Disposition: A | Discharge status: Home / Self Care | Payer: 59 | Attending: Emergency Medicine | Admitting: Emergency Medicine

## 2016-07-29 ENCOUNTER — Encounter: Payer: Self-pay | Admitting: Emergency Medicine

## 2016-07-29 ENCOUNTER — Other Ambulatory Visit: Payer: Self-pay

## 2016-07-29 DIAGNOSIS — I25810 Atherosclerosis of coronary artery bypass graft(s) without angina pectoris: Secondary | ICD-10-CM | POA: Insufficient documentation

## 2016-07-29 DIAGNOSIS — R1030 Lower abdominal pain, unspecified: Secondary | ICD-10-CM | POA: Diagnosis present

## 2016-07-29 DIAGNOSIS — K4090 Unilateral inguinal hernia, without obstruction or gangrene, not specified as recurrent: Secondary | ICD-10-CM

## 2016-07-29 DIAGNOSIS — I10 Essential (primary) hypertension: Secondary | ICD-10-CM | POA: Diagnosis not present

## 2016-07-29 DIAGNOSIS — I252 Old myocardial infarction: Secondary | ICD-10-CM | POA: Diagnosis not present

## 2016-07-29 DIAGNOSIS — Z951 Presence of aortocoronary bypass graft: Secondary | ICD-10-CM | POA: Diagnosis not present

## 2016-07-29 DIAGNOSIS — F17210 Nicotine dependence, cigarettes, uncomplicated: Secondary | ICD-10-CM | POA: Diagnosis not present

## 2016-07-29 MED ORDER — HYDROMORPHONE HCL 1 MG/ML IJ SOLN
1.0000 mg | INTRAMUSCULAR | Status: AC
  Administered 2016-07-29: 1 mg via INTRAMUSCULAR
  Filled 2016-07-29: qty 1

## 2016-07-29 NOTE — Discharge Instructions (Signed)
The surgeon on call recommends buying a hernia belt or hernia underwear.  These garments help maintain pressure to the area of the hernia to prevent bulging.

## 2016-07-29 NOTE — ED Provider Notes (Signed)
[facility] Emergency Department Provider Note  ____________________________________________  Time seen: Approximately 12:31 PM  I have reviewed the triage vital signs and the nursing notes.   HISTORY  Chief Complaint Groin Pain    HPI Jonathan Escobar is a 70 y.o. male who complains of right groin pain and bulging from his hernia. He has a known right inguinal hernia, evaluated by surgery on 07/02/2016, plan for elective hernia repair on 08/06/2016. He is planning to stop his aspirin and Plavix before that and see cardiology on December 4 for continued management of his antiplatelets.  This morning on the shower he had sudden onset of right a little pain. He is able to ambulate out of bed which caused the hernia area which was bulging at the time to reduce spontaneously. 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 He was unable to reduce it and so comes to the ED for evaluation. Denies any vomiting, no constipation. No fever. No abdominal pain other than right groin pain.     Past Medical History:  Diagnosis Date  . CAD (coronary artery disease), autologous vein bypass graft Nov 2014   Inf STEMI - PCI to SVG-RPDA:   . CAD S/P percutaneous coronary angioplasty 1999   BMS to prox RCA 1999; Brachytherapy ~2003, followed by DES  PCI (Taxus) for ISR.  Marland Kitchen Coronary stent restenosis due to progression of disease 2000; 2005   (While Still In [place]) Status post Brachytherapy to RCA ISR - 2000; Redo PCI with DES 2005  . Essential hypertension   . History of GI bleed     while on Plavix  . History of viral meningitis    following GI bleed   . History of: ST elevation myocardial infarction (STEMI) of inferior wall, subsequent episode of care April 25, 2012;   RCA: 95% ISR followed by 100% stenosis post-stent with significant RPL lesions;  LAD: Tandem 80 and 70% lesions involving D1 (1, 1, 1) --> initial angioplasty to RCA aspiration thrombectomy --> urgent CABG;  . Hyperlipidemia LDL goal <70   . Obesity (BMI 30.0-34.9)   . S/P CABG x 4: LIMA-LAD, seq SVG-PD-PLA; SVG-diag. 04/28/2012  . S/P coronary artery stent placement Nov-Dec 2014   a) Inf STEMIL 11/28/'14 --> extensive (full metal Jacket) of SVG-RPDA Promus DES - 3 mm x 38 mm, 3 mm x 38 mm, 3 mm x 20 mm, 2.75 mm x 38 mm -- all postdilated to ~3.3 mm;; b) 12/1/'14: staged PCI to distal SVG-diagonal: Xience Xpedition 2.5 mm x 20 mm (2.66 mm)  . ST elevation myocardial infarction (STEMI) of inferior wall, subsequent episode of care [facility])  Jul 29, 2013   Diffuse near occlusion of SVG-rPDA, 90% SVG-Diag     Patient Active Problem List   Diagnosis Date Noted  . Right inguinal hernia 07/02/2016  . Personal history of other malignant neoplasm of skin 10/08/2015  . Essential hypertension 07/19/2014  . Fatigue 01/05/2014  . Heart palpitations 09/10/2013  . Obesity (BMI 30-39.9) 08/09/2013  . STEMI 07/29/13- successful complex full metal jacket stenting of the SVG-PDA/PLA -07/29/2013, staged SVG-Dx PCI 12/01 07/29/2013  . CAD (coronary artery disease), autologous vein bypass graft --> full metal jacket PCI of SVG-RCA; PCI of SVG-D1 anastomosis 07/02/2013  . Hyperlipidemia LDL goal <70   . 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 04/29/2012  . ST elevation myocardial infarction (STEMI) of inferior wall, initial episode of care - s/p PTCA of 95% ISR to 70% and 100% distal stent occlusion to ~40% (8/25) 04/25/2012  . H/O: GI bleed - 2007 on Plavix 04/25/2012  . CAD in native artery - prior Inferior MI - RCA PCI; 04/2012 - Inferior STEMI - RCA occluded (unable to dilate lesion adequately) + LAD & D1 disease --> referred for CABG 04/07/2012     Past Surgical History:  Procedure Laterality Date  . CARDIAC CATHETERIZATION  04/28/12   RCA: 95% ISR followed by 100%  stenosis post-stent with significant RPL lesions; LAD: Tandem 80 and 70% lesions involving D1 (1, 1, 1) --> initial angioplasty to RCA aspiration thrombectomy --> urgent CABG;  . CORONARY ARTERY BYPASS GRAFT  04/28/2012   Procedure: CORONARY ARTERY BYPASS GRAFTING (CABG);  Surgeon: Gaye Pollack, MD;  Location: [place];  Service: Open Heart Surgery;  Laterality: N/A;  . HERNIA REPAIR    . LEFT AND RIGHT HEART CATHETERIZATION WITH CORONARY ANGIOGRAM N/A 04/25/2012   Procedure: LEFT AND RIGHT HEART CATHETERIZATION WITH CORONARY ANGIOGRAM;  Surgeon: Leonie Man, MD;  Location: [facility] CATH LAB;  Service: Cardiovascular;  Laterality: N/A;  . LEFT HEART CATHETERIZATION WITH CORONARY/GRAFT ANGIOGRAM  07/29/2013   Procedure: LEFT HEART CATHETERIZATION WITH Beatrix Fetters;  Surgeon: Blane Ohara, MD;  Location: [facility] CATH LAB;  Service: Cardiovascular; ; 95% stenosis with percent mid occlusion of SVG-RPDA; 90% anastomotic SVG-diagonal, moderate 50% disease in the native circumflex with patent OM 1 OM 2. 80-90% proximal LAD with patent LIMA to LAD , EF 50%  . MOHS SURGERY    . NM MYOVIEW LTD  Jan 2015   Moderate Inferior infarct (as expected), but no ischemia & EF ~45-50%  . PERCUTANEOUS CORONARY STENT INTERVENTION (PCI-S)  07/29/2013   Procedure: PERCUTANEOUS CORONARY STENT INTERVENTION (PCI-S);  Surgeon: Blane Ohara, MD;  Location: [facility] CATH LAB;  Service: Cardiovascular;; PCI to SVG-RPDA: Promus DES - 3 mm x 38 mm, 3 mm x 38 mm, 3 mm x 20 mm, 2.75 mm x 38 mm -- all postdilated to ~3.3 mm;  . PERCUTANEOUS CORONARY STENT INTERVENTION (PCI-S) N/A 08/01/2013   Procedure: PERCUTANEOUS CORONARY STENT INTERVENTION (PCI-S);  Surgeon: Leonie Man, MD;  Location: [facility] CATH LAB;  Service: Cardiovascular;staged PCI to distal SVG-diagonal: Xience Xpedition 2.5 mm x 20 mm (2.66 mm)    . TRANSTHORACIC ECHOCARDIOGRAM  12/'13; 07/30/2013   a) Post CABG: EF 50-55%, moderate HK of anteroseptal wall, septal  dyskinesis/dyssynergy due to poststernotomy;  grade 1 diastolic dysfunction. Mildly dilated LA, mildly reduced RV function-likely due to septal dyssynergy.; b) 11/'14 Post Inferior STEMI: EF 50-55% mild LVH. Mild hypokinesis of the distal lateral, and basal to mid inferior lateral and inferior walls -- consistent with RCA infarct     Prior to Admission medications   Medication Sig Start Date End Date Taking? Authorizing Provider  acetaminophen (TYLENOL) 325 MG tablet Take 2 tablets (650 mg total) by mouth every 4 (four) hours as needed for headache or mild pain. 08/02/13   Erlene Quan, PA-C  ALPRAZolam Duanne Moron) 0.25 MG tablet Take 0.125 mg by mouth at bedtime as needed for sleep.    Historical Provider, MD  Ascorbic Acid (VITAMIN C) 1000 MG tablet Take 1,000 mg by mouth daily.    Historical Provider, MD  aspirin 81 MG tablet Take 81 mg by mouth daily.    Historical Provider, MD  atorvastatin (  LIPITOR) 20 MG tablet Take 1 tablet (20 mg total) by mouth daily at 6 PM. 07/01/16   Leonie Man, MD  cetirizine (ZYRTEC) 10 MG tablet Take 10 mg by mouth daily.    Historical Provider, MD  clopidogrel (PLAVIX) 75 MG tablet Take 1 tablet (75 mg total) by mouth daily with breakfast. 08/02/13   Erlene Quan, PA-C  metoprolol succinate (TOPROL-XL) 50 MG 24 hr tablet Take 1 tablet (50 mg total) by mouth daily. 11/22/15   Leonie Man, MD  nitroGLYCERIN (NITROSTAT) 0.4 MG SL tablet Place 1 tablet (0.4 mg total) under the tongue every 5 (five) minutes x 3 doses as needed for chest pain. 03/01/15   Leonie Man, MD  pantoprazole (PROTONIX) 40 MG tablet Take 1 tablet (40 mg total) by mouth daily at 6 (six) AM. 08/02/13   Erlene Quan, PA-C  valsartan (DIOVAN) 320 MG tablet Take 1 tablet (320 mg total) by mouth daily. 11/22/15   Leonie Man, MD     Allergies Ace inhibitors; Norvasc [amlodipine besylate]; and Amoxicillin   Family History  Problem Relation Age of Onset  . Alzheimer's disease Mother    . Heart failure Father   . Coronary artery disease Father   . Valvular heart disease Sister     Social History Social History  Substance Use Topics  . Smoking status: Former Smoker    Packs/day: 1.00    Years: 10.00    Types: Cigarettes    Quit date: 09/01/1976  . Smokeless tobacco: Never Used  . Alcohol use 8.4 oz/week    14 Glasses of wine per week    Review of Systems  Constitutional:   No fever or chills.  ENT:   No sore throat. No rhinorrhea. Cardiovascular:   No chest pain. Respiratory:   No dyspnea or cough. Gastrointestinal:   Right groin pain as above without vomiting and diarrhea.  Genitourinary:   Negative for dysuria or difficulty urinating. Musculoskeletal:   Negative for focal pain or swelling Neurological:   Negative for headaches 10-point ROS otherwise negative.  ____________________________________________   PHYSICAL EXAM:  VITAL SIGNS: ED Triage Vitals  Enc Vitals Group     BP 07/29/16 1106 (!) 175/92     Pulse Rate 07/29/16 1106 66     Resp 07/29/16 1106 18     Temp 07/29/16 1106 98.2 F (36.8 C)     Temp Source 07/29/16 1106 Oral     SpO2 07/29/16 1106 99 %     Weight 07/29/16 1107 206 lb (93.4 kg)     Height --      Head Circumference --      Peak Flow --      Pain Score 07/29/16 1107 8     Pain Loc --      Pain Edu? --      Excl. in [place]? --   Patient examined after intramuscular Dilaudid for control of acute pain.  Vital signs reviewed, nursing assessments reviewed.   Constitutional:   Alert and oriented. Well appearing and in no distress. Eyes:   No scleral icterus. No conjunctival pallor. ENT   Head:   Normocephalic and atraumatic. Gastrointestinal:   Soft and nontender. Non distended. There is no CVA tenderness.  No rebound, rigidity, or guarding. Genitourinary:   Unremarkable. No hernia, no appreciable fascial defect, no mass in the inguinal canal. Musculoskeletal:   Nontender with normal range of motion in all extremities.  No joint effusions.  No  lower extremity tenderness.  No edema. Neurologic:   Normal speech and language.  CN 2-10 normal. Motor grossly intact. No gross focal neurologic deficits are appreciated.   ____________________________________________    LABS (pertinent positives/negatives) (all labs ordered are listed, but only abnormal results are displayed) Labs Reviewed - No data to display ____________________________________________   EKG    ____________________________________________    RADIOLOGY    ____________________________________________   PROCEDURES Procedures  ____________________________________________   INITIAL IMPRESSION / ASSESSMENT AND PLAN / ED COURSE  Pertinent labs & imaging results that were available during my care of the patient were reviewed by me and considered in my medical decision making (see chart for details).  Patient presented with acute severe pain from right inguinal hernia. Patient was standing and unable to sit or lie down due to the severe pain, so he was given intramuscular Dilaudid to allow him to lie back to facilitate examination and attempt at manual reduction of the hernia. However, once the patient was more comfortable and able to lie down, the hernia did spontaneously reduce itself.  Now that the patient is back to baseline and feels very comfortable, with no evidence of bowel ischemia, and very short timeframe of possible hernia incarceration, he is suitable for outpatient follow-up and continuing on with his elective scheduled surgery. This is especially the preferred approach since he needs to discontinue antiplatelet agents and see his cardiologist as part of the preop planning. Recommended he is a hernia belt or hernia underwear for the next week before surgery. Return precautions given.     Clinical Course    ____________________________________________   FINAL CLINICAL IMPRESSION(S) / ED DIAGNOSES  Final diagnoses:   Right inguinal hernia       Portions of this note were generated with dragon dictation software. Dictation errors may occur despite best attempts at proofreading.    Carrie Mew, MD 07/29/16 1240

## 2016-07-29 NOTE — ED Notes (Signed)
ED Provider at bedside. 

## 2016-07-29 NOTE — ED Notes (Signed)
Pt was given hydromorphone IM injection to his left ventrogluteal site which pt rolled over to his right side to get. Upon rolling back over to his back pt states the hernia site feels "the best it has been" and upon palpation reports not feeling the hernia and does not report pain at the site.

## 2016-07-29 NOTE — ED Notes (Signed)
PT able to ambulate around unit without difficulty and able to sit up and bend over without pain or complication with hernia. Pt verbalized feeling well enough to go home.

## 2016-07-29 NOTE — ED Triage Notes (Signed)
Pt to ed with c/o right groin pain. Pt states known hernia and scheduled for surgery.  This am reports he bent over and felt it pop out.  Pt states unable to reduce it back .

## 2016-07-29 NOTE — ED Notes (Signed)
Pt reports hx of inguinal hernia with surgery scheduled on the 6th of December. Pt states this am the hernia prolapsed but was able to reduce it however prior to arrival he bent over at work and it prolapsed again and pt states he was unable to reduce it this time. Pt reports pain when trying to reduce the hernia back in and with ambulation.

## 2016-08-04 ENCOUNTER — Ambulatory Visit (INDEPENDENT_AMBULATORY_CARE_PROVIDER_SITE_OTHER): Payer: 59 | Admitting: Cardiovascular Disease

## 2016-08-04 ENCOUNTER — Encounter: Payer: Self-pay | Admitting: Cardiovascular Disease

## 2016-08-04 VITALS — BP 138/90 | HR 72 | Ht 69.0 in | Wt 217.8 lb

## 2016-08-04 DIAGNOSIS — I25810 Atherosclerosis of coronary artery bypass graft(s) without angina pectoris: Secondary | ICD-10-CM

## 2016-08-04 DIAGNOSIS — E7800 Pure hypercholesterolemia, unspecified: Secondary | ICD-10-CM

## 2016-08-04 DIAGNOSIS — I10 Essential (primary) hypertension: Secondary | ICD-10-CM | POA: Diagnosis not present

## 2016-08-04 DIAGNOSIS — R0989 Other specified symptoms and signs involving the circulatory and respiratory systems: Secondary | ICD-10-CM | POA: Diagnosis not present

## 2016-08-04 NOTE — Progress Notes (Signed)
Cardiology Office Note   Date:  08/04/2016   ID:  Jonathan, Escobar [birth year], MRN [id number]  PCP:  Jonathan Maidens, MD  Cardiologist:   Jonathan Sacramento, MD   Chief Complaint  Patient presents with  . other    7mo f/u. Pt states he is doing well, is having hernia repair surgery in 2 days. Reviewed meds with pt verbally.      History of Present Illness: Jonathan Escobar is a 70 y.o. male who presents for a follow up visit. Previously seen by Dr. Ellyn Hack.  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  Inferior STEMI 04/25/2012 - 95% ISR followed by 5% stenosis post stent with lesions in the PL and PDA. Also noted 70% mid LAD; after aspiration thrombectomy, balloon angioplasty performed and he was sent for urgent CABG after stabilization on Integrelin (LIMA-LAD, SVG-PDA and PLA, SVG-Diag)   Inferior STEMI in November 2014 with occluded SVG-PDA --> extensive PCI (full metal jacket) of SVG followed by staged PCI of anastomotic SVG-diagonal lesion. He is scheduled for hernia surgery In 2 days. He actually went to the emergency room last week with incarceration of the hernia was reduced conservatively with pain management. The patient is currently off aspirin and Plavix. He denies any chest pain or shortness of breath. 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 He is active with no significant exertional limitations.  Past Medical History:  Diagnosis Date  . CAD (coronary artery disease), autologous vein bypass graft Nov 2014   Inf STEMI - PCI to SVG-RPDA:   . CAD S/P percutaneous coronary angioplasty 1999   BMS to prox RCA 1999; Brachytherapy ~2003, followed by DES  PCI (Taxus) for ISR.  Marland Kitchen Coronary stent restenosis due to progression of disease 2000; 2005   (While Still In [place]) Status post  Brachytherapy to RCA ISR - 2000; Redo PCI with DES 2005  . Essential hypertension    CONTROLLED ON MEDS  . GERD (gastroesophageal reflux disease)   . History of GI bleed     while on Plavix  . History of viral meningitis    following GI bleed   . History of: ST elevation myocardial infarction (STEMI) of inferior wall, subsequent episode of care April 25, 2012;   RCA: 95% ISR followed by 100% stenosis post-stent with significant RPL lesions; LAD: Tandem 80 and 70% lesions involving D1 (1, 1, 1) --> initial angioplasty to RCA aspiration thrombectomy --> urgent CABG;  . Hyperlipidemia LDL goal <70   . Obesity (BMI 30.0-34.9)   . S/P CABG x 4: LIMA-LAD, seq SVG-PD-PLA; SVG-diag. 04/28/2012  . S/P coronary artery stent placement Nov-Dec 2014   a) Inf STEMIL 11/28/'14 --> extensive (full metal Jacket) of SVG-RPDA Promus DES - 3 mm x 38 mm, 3 mm x 38 mm, 3 mm x 20 mm, 2.75 mm x 38 mm -- all postdilated to ~3.3 mm;; b) 12/1/'14: staged PCI to distal SVG-diagonal: Xience Xpedition 2.5 mm x 20 mm (2.66 mm)  . ST elevation myocardial infarction (STEMI) of inferior wall, subsequent episode of care [facility])  Jul 29, 2013   Diffuse near occlusion of SVG-rPDA, 90% SVG-Diag    Past Surgical History:  Procedure Laterality Date  . CARDIAC CATHETERIZATION  04/28/12   RCA: 95% ISR  followed by 100% stenosis post-stent with significant RPL lesions; LAD: Tandem 80 and 70% lesions involving D1 (1, 1, 1) --> initial angioplasty to RCA aspiration thrombectomy --> urgent CABG;  . CORONARY ARTERY BYPASS GRAFT  04/28/2012   Procedure: CORONARY ARTERY BYPASS GRAFTING (CABG);  Surgeon: Gaye Pollack, MD;  Location: [place];  Service: Open Heart Surgery;  Laterality: N/A;  . HERNIA REPAIR Left   . LEFT AND RIGHT HEART CATHETERIZATION WITH CORONARY ANGIOGRAM N/A 04/25/2012   Procedure: LEFT AND RIGHT HEART CATHETERIZATION WITH CORONARY ANGIOGRAM;  Surgeon: Leonie Man, MD;  Location: [facility] CATH LAB;  Service: Cardiovascular;   Laterality: N/A;  . LEFT HEART CATHETERIZATION WITH CORONARY/GRAFT ANGIOGRAM  07/29/2013   Procedure: LEFT HEART CATHETERIZATION WITH Beatrix Fetters;  Surgeon: Blane Ohara, MD;  Location: [facility] CATH LAB;  Service: Cardiovascular; ; 95% stenosis with percent mid occlusion of SVG-RPDA; 90% anastomotic SVG-diagonal, moderate 50% disease in the native circumflex with patent OM 1 OM 2. 80-90% proximal LAD with patent LIMA to LAD , EF 50%  . MOHS SURGERY     FOREHEAD  . NM MYOVIEW LTD  Jan 2015   Moderate Inferior infarct (as expected), but no ischemia & EF ~45-50%  . PERCUTANEOUS CORONARY STENT INTERVENTION (PCI-S)  07/29/2013   Procedure: PERCUTANEOUS CORONARY STENT INTERVENTION (PCI-S);  Surgeon: Blane Ohara, MD;  Location: [facility] CATH LAB;  Service: Cardiovascular;; PCI to SVG-RPDA: Promus DES - 3 mm x 38 mm, 3 mm x 38 mm, 3 mm x 20 mm, 2.75 mm x 38 mm -- all postdilated to ~3.3 mm;  . PERCUTANEOUS CORONARY STENT INTERVENTION (PCI-S) N/A 08/01/2013   Procedure: PERCUTANEOUS CORONARY STENT INTERVENTION (PCI-S);  Surgeon: Leonie Man, MD;  Location: [facility] CATH LAB;  Service: Cardiovascular;staged PCI to distal SVG-diagonal: Xience Xpedition 2.5 mm x 20 mm (2.66 mm)    . TRANSTHORACIC ECHOCARDIOGRAM  12/'13; 07/30/2013   a) Post CABG: EF 50-55%, moderate HK of anteroseptal wall, septal dyskinesis/dyssynergy due to poststernotomy;  grade 1 diastolic dysfunction. Mildly dilated LA, mildly reduced RV function-likely due to septal dyssynergy.; b) 11/'14 Post Inferior STEMI: EF 50-55% mild LVH. Mild hypokinesis of the distal lateral, and basal to mid inferior lateral and inferior walls -- consistent with RCA infarct     Current Outpatient Prescriptions  Medication Sig Dispense Refill  . acetaminophen (TYLENOL) 325 MG tablet Take 2 tablets (650 mg total) by mouth every 4 (four) hours as needed for headache or mild pain.    . Ascorbic Acid (VITAMIN C) 1000 MG tablet Take 1,000 mg by mouth daily.     Marland Kitchen aspirin 81 MG tablet Take 81 mg by mouth daily.    Marland Kitchen atorvastatin (LIPITOR) 20 MG tablet Take 1 tablet (20 mg total) by mouth daily at 6 PM. 90 tablet 0  . cetirizine (ZYRTEC) 10 MG tablet Take 10 mg by mouth daily. PM    . clopidogrel (PLAVIX) 75 MG tablet Take 1 tablet (75 mg total) by mouth daily with breakfast. 90 tablet 3  . metoprolol succinate (TOPROL-XL) 50 MG 24 hr tablet Take 1 tablet (50 mg total) by mouth daily. (Patient taking differently: Take 50 mg by mouth daily. PM) 90 tablet 3  . nitroGLYCERIN (NITROSTAT) 0.4 MG SL tablet Place 1 tablet (0.4 mg total) under the tongue every 5 (five) minutes x 3 doses as needed for chest pain. 25 tablet 3  . pantoprazole (PROTONIX) 40 MG tablet Take 1 tablet (40 mg total) by mouth daily at  6 (six) AM. (Patient taking differently: Take 40 mg by mouth daily at 6 (six) AM. PM) 90 tablet 3  . traZODone (DESYREL) 50 MG tablet Take 50 mg by mouth at bedtime.    . valsartan (DIOVAN) 320 MG tablet Take 1 tablet (320 mg total) by mouth daily. (Patient taking differently: Take 320 mg by mouth daily. PM) 90 tablet 3   No current facility-administered medications for this visit.     Allergies:   Ace inhibitors; Norvasc [amlodipine besylate]; and Amoxicillin    Social History:  The patient  reports that he quit smoking about 39 years ago. His smoking use included Cigarettes. He has a 10.00 pack-year smoking history. He has never used smokeless tobacco. He reports that he drinks about 1.2 oz of alcohol per week . He reports that he does not use drugs.   Family History:  The patient's family history includes Alzheimer's disease in his mother; Coronary artery disease in his father; Heart failure in his father; Valvular heart disease in his sister.    ROS:  Please see the history of present illness.   Otherwise, review of systems are positive for none.   All other systems are reviewed and negative.    PHYSICAL EXAM: VS:  BP 138/90 (BP Location: Left  Arm, Patient Position: Sitting, Cuff Size: Normal)   Pulse 72   Ht 5\' 9"  (1.753 m)   Wt 217 lb 12 oz (98.8 kg)   BMI 32.16 kg/m  , BMI Body mass index is 32.16 kg/m. GEN: Well nourished, well developed, in no acute distress  HEENT: normal  Neck: no JVD,  or masses. Faint bilateral carotid bruits Cardiac: RRR; no murmurs, rubs, or gallops,no edema  Respiratory:  clear to auscultation bilaterally, normal work of breathing GI: soft, nontender, nondistended, + BS MS: no deformity or atrophy  Skin: warm and dry, no rash Neuro:  Strength and sensation are intact Psych: euthymic mood, full affect   EKG:  EKG is ordered today. The ekg ordered today demonstrates normal sinus rhythm with nonspecific ST changes.   Recent Labs: 12/12/2015: ALT 25; BUN 12; Creatinine, Ser 0.95; Platelets 230; Potassium 4.1; Sodium 139    Lipid Panel    Component Value Date/Time   CHOL 141 12/12/2015 0827   CHOL 153 09/07/2012 1201   TRIG 94 12/12/2015 0827   TRIG 185 09/07/2012 1201   HDL 93 12/12/2015 0827   HDL 31 (L) 09/07/2012 1201   CHOLHDL 1.5 12/12/2015 0827   CHOLHDL 3.2 07/30/2013 0403   VLDL 14 07/30/2013 0403   VLDL 37 09/07/2012 1201   LDLCALC 29 12/12/2015 0827   LDLCALC 85 09/07/2012 1201      Wt Readings from Last 3 Encounters:  08/04/16 217 lb 12 oz (98.8 kg)  07/29/16 206 lb (93.4 kg)  07/02/16 206 lb (93.4 kg)      No flowsheet data found.    ASSESSMENT AND PLAN:  1.  Coronary artery disease involving bypass graft without angina: He is doing well overall with no anginal symptoms. Continue medical therapy. Given his extensive cardiac stenting including vein grafts, I recommend lifelong dual antiplatelet therapy.   2. History of GI bleed: No evidence of bleeding on dual antiplatelet therapy.  3. Hyperlipidemia: Continue treatment with atorvastatin. His most recent LDL was 29 when he was on ketogenic diet. He will require a follow-up lipid profile in the next few  months.  4. Essential hypertension: Blood pressure is well controlled on current medications.  5. Bilateral carotid bruits: I requested carotid Doppler.  6. Preoperative cardiovascular evaluation for hernia surgery: The patient has no cardiac symptoms and EKG is unremarkable. He is at an overall low risk for cardiovascular complications. However, the biggest issue is management of dual antiplatelet therapy. Given his extensive stenting including in vein grafts, he should continue to take low-dose aspirin 81 mg once daily. His Plavix should be resumed the day after surgery if no bleeding complications.   Disposition:   FU with me in 6 months  Signed,  Jonathan Sacramento, MD  08/04/2016 1:44 PM    [place]

## 2016-08-04 NOTE — Patient Instructions (Signed)
Medication Instructions:  Your physician has recommended you make the following change in your medication:  RESUME aspirin 81mg  once daily You may resume plavix one day post surgical procedure. 150mg  once on the first day only then take 75mg  once daily    Labwork: none  Testing/Procedures: Your physician has requested that you have a carotid duplex. This test is an ultrasound of the carotid arteries in your neck. It looks at blood flow through these arteries that supply the brain with blood. Allow one hour for this exam. There are no restrictions or special instructions.    Follow-Up: Your physician wants you to follow-up in: 6 months with Dr. Fletcher Anon.  You will receive a reminder letter in the mail two months in advance. If you don't receive a letter, please call our office to schedule the follow-up appointment.   Any Other Special Instructions Will Be Listed Below (If Applicable).     If you need a refill on your cardiac medications before your next appointment, please call your pharmacy.

## 2016-08-06 ENCOUNTER — Ambulatory Visit: Payer: 59 | Admitting: Anesthesiology

## 2016-08-06 ENCOUNTER — Encounter
Admission: RE | Disposition: A | Discharge status: Home / Self Care | Payer: Self-pay | Source: Ambulatory Visit | Attending: General Surgery

## 2016-08-06 ENCOUNTER — Ambulatory Visit
Admission: RE | Admit: 2016-08-06 | Discharge: 2016-08-06 | Disposition: A | Discharge status: Home / Self Care | Payer: 59 | Source: Ambulatory Visit | Attending: General Surgery | Admitting: General Surgery

## 2016-08-06 DIAGNOSIS — K4090 Unilateral inguinal hernia, without obstruction or gangrene, not specified as recurrent: Secondary | ICD-10-CM | POA: Diagnosis not present

## 2016-08-06 DIAGNOSIS — K219 Gastro-esophageal reflux disease without esophagitis: Secondary | ICD-10-CM | POA: Insufficient documentation

## 2016-08-06 DIAGNOSIS — I252 Old myocardial infarction: Secondary | ICD-10-CM | POA: Diagnosis not present

## 2016-08-06 DIAGNOSIS — I10 Essential (primary) hypertension: Secondary | ICD-10-CM | POA: Diagnosis not present

## 2016-08-06 DIAGNOSIS — Z79899 Other long term (current) drug therapy: Secondary | ICD-10-CM | POA: Insufficient documentation

## 2016-08-06 DIAGNOSIS — Z955 Presence of coronary angioplasty implant and graft: Secondary | ICD-10-CM | POA: Insufficient documentation

## 2016-08-06 DIAGNOSIS — Z87891 Personal history of nicotine dependence: Secondary | ICD-10-CM | POA: Diagnosis not present

## 2016-08-06 DIAGNOSIS — Z7982 Long term (current) use of aspirin: Secondary | ICD-10-CM | POA: Insufficient documentation

## 2016-08-06 DIAGNOSIS — E785 Hyperlipidemia, unspecified: Secondary | ICD-10-CM | POA: Insufficient documentation

## 2016-08-06 DIAGNOSIS — Z880 Allergy status to penicillin: Secondary | ICD-10-CM | POA: Insufficient documentation

## 2016-08-06 DIAGNOSIS — Z951 Presence of aortocoronary bypass graft: Secondary | ICD-10-CM | POA: Diagnosis not present

## 2016-08-06 DIAGNOSIS — I2510 Atherosclerotic heart disease of native coronary artery without angina pectoris: Secondary | ICD-10-CM | POA: Insufficient documentation

## 2016-08-06 HISTORY — PX: INGUINAL HERNIA REPAIR: SHX194

## 2016-08-06 HISTORY — DX: Gastro-esophageal reflux disease without esophagitis: K21.9

## 2016-08-06 SURGERY — REPAIR, HERNIA, INGUINAL, ADULT
Anesthesia: General | Laterality: Right | Wound class: Clean

## 2016-08-06 MED ORDER — FENTANYL CITRATE (PF) 100 MCG/2ML IJ SOLN
INTRAMUSCULAR | Status: DC | PRN
  Administered 2016-08-06 (×5): 25 ug via INTRAVENOUS

## 2016-08-06 MED ORDER — SODIUM CHLORIDE 0.9 % IV SOLN
INTRAVENOUS | Status: DC | PRN
  Administered 2016-08-06: 40 mL

## 2016-08-06 MED ORDER — LIDOCAINE HCL 1 % IJ SOLN
INTRAMUSCULAR | Status: DC | PRN
  Administered 2016-08-06: 5 mL

## 2016-08-06 MED ORDER — CHLORHEXIDINE GLUCONATE CLOTH 2 % EX PADS: 6.0000 | MEDICATED_PAD | Freq: Once | CUTANEOUS | Status: DC

## 2016-08-06 MED ORDER — LIDOCAINE HCL (CARDIAC) 20 MG/ML IV SOLN
INTRAVENOUS | Status: DC | PRN
  Administered 2016-08-06: 80 mg via INTRATRACHEAL

## 2016-08-06 MED ORDER — HYDROCODONE-ACETAMINOPHEN 5-325 MG PO TABS: 1.0000 | ORAL_TABLET | Freq: Four times a day (QID) | ORAL | 0 refills | Status: DC | PRN

## 2016-08-06 MED ORDER — SCOPOLAMINE 1 MG/3DAYS TD PT72
1.0000 | MEDICATED_PATCH | Freq: Once | TRANSDERMAL | Status: DC
  Administered 2016-08-06: 1.5 mg via TRANSDERMAL

## 2016-08-06 MED ORDER — ONDANSETRON HCL 4 MG/2ML IJ SOLN
INTRAMUSCULAR | Status: DC | PRN
  Administered 2016-08-06: 4 mg via INTRAVENOUS

## 2016-08-06 MED ORDER — EPHEDRINE SULFATE 50 MG/ML IJ SOLN
INTRAMUSCULAR | Status: DC | PRN
  Administered 2016-08-06 (×7): 10 mg via INTRAVENOUS

## 2016-08-06 MED ORDER — ACETAMINOPHEN 10 MG/ML IV SOLN
1000.0000 mg | Freq: Once | INTRAVENOUS | Status: AC
  Administered 2016-08-06: 1000 mg via INTRAVENOUS

## 2016-08-06 MED ORDER — CIPROFLOXACIN IN D5W 400 MG/200ML IV SOLN: 400.0000 mg | INTRAVENOUS | Status: DC

## 2016-08-06 MED ORDER — PROPOFOL 10 MG/ML IV BOLUS
INTRAVENOUS | Status: DC | PRN
  Administered 2016-08-06: 200 mg via INTRAVENOUS

## 2016-08-06 MED ORDER — MIDAZOLAM HCL 5 MG/5ML IJ SOLN
INTRAMUSCULAR | Status: DC | PRN
  Administered 2016-08-06: 2 mg via INTRAVENOUS

## 2016-08-06 MED ORDER — LACTATED RINGERS IV SOLN
INTRAVENOUS | Status: DC
  Administered 2016-08-06: 13:00:00 via INTRAVENOUS

## 2016-08-06 MED ORDER — DEXAMETHASONE SODIUM PHOSPHATE 4 MG/ML IJ SOLN
INTRAMUSCULAR | Status: DC | PRN
  Administered 2016-08-06: 4 mg via INTRAVENOUS

## 2016-08-06 SURGICAL SUPPLY — 30 items
BLADE SURG 15 STRL LF DISP TIS (BLADE) ×1 IMPLANT
BLADE SURG 15 STRL SS (BLADE) ×2
CHLORAPREP W/TINT 26ML (MISCELLANEOUS) ×3 IMPLANT
COVER LIGHT HANDLE UNIVERSAL (MISCELLANEOUS) ×6 IMPLANT
DRAIN PENROSE 1/4X12 LTX (DRAIN) ×3 IMPLANT
DRAPE LAPAROTOMY T 102X78X121 (DRAPES) ×3 IMPLANT
ELECT CAUTERY BLADE TIP 2.5 (TIP) ×3
ELECTRODE CAUTERY BLDE TIP 2.5 (TIP) ×1 IMPLANT
GLOVE BIO SURGEON STRL SZ7.5 (GLOVE) ×3 IMPLANT
GLOVE INDICATOR 8.0 STRL GRN (GLOVE) ×3 IMPLANT
GOWN STRL REUS W/ TWL LRG LVL3 (GOWN DISPOSABLE) ×2 IMPLANT
GOWN STRL REUS W/TWL LRG LVL3 (GOWN DISPOSABLE) ×4
KIT ROOM TURNOVER OR (KITS) ×3 IMPLANT
LIQUID BAND (GAUZE/BANDAGES/DRESSINGS) ×3 IMPLANT
MESH PARIETEX PROGRIP RIGHT (Mesh General) ×3 IMPLANT
NEEDLE HYPO 25GX1X1/2 BEV (NEEDLE) ×3 IMPLANT
NS IRRIG 500ML POUR BTL (IV SOLUTION) ×3 IMPLANT
PACK BASIN MINOR ARMC (MISCELLANEOUS) ×3 IMPLANT
PAD GROUND ADULT SPLIT (MISCELLANEOUS) ×3 IMPLANT
STRAP BODY AND KNEE 60X3 (MISCELLANEOUS) ×3 IMPLANT
SUT ETHIBOND 0 MO6 C/R (SUTURE) IMPLANT
SUT MNCRL 4-0 (SUTURE) ×2
SUT MNCRL 4-0 27XMFL (SUTURE) ×1
SUT SILK 2 0 SH (SUTURE) IMPLANT
SUT VIC AB 2-0 CT2 27 (SUTURE) ×6 IMPLANT
SUT VIC AB 3-0 SH 27 (SUTURE) ×2
SUT VIC AB 3-0 SH 27X BRD (SUTURE) ×1 IMPLANT
SUTURE MNCRL 4-0 27XMF (SUTURE) ×1 IMPLANT
SYR 20CC LL (SYRINGE) ×6 IMPLANT
SYR BULB IRRIG 60ML STRL (SYRINGE) ×3 IMPLANT

## 2016-08-06 NOTE — Transfer of Care (Signed)
Immediate Anesthesia Transfer of Care Note  Patient: Jonathan Escobar  Procedure(s) Performed: Procedure(s): HERNIA REPAIR INGUINAL ADULT (Right)  Patient Location: PACU  Anesthesia Type: General LMA  Level of Consciousness: awake, alert  and patient cooperative  Airway and Oxygen Therapy: Patient Spontanous Breathing and Patient connected to supplemental oxygen  Post-op Assessment: Post-op Vital signs reviewed, Patient's Cardiovascular Status Stable, Respiratory Function Stable, Patent Airway and No signs of Nausea or vomiting  Post-op Vital Signs: Reviewed and stable  Complications: No apparent anesthesia complications

## 2016-08-06 NOTE — Discharge Instructions (Signed)
General Anesthesia, Adult, Care After These instructions provide you with information about caring for yourself after your procedure. Your health care provider may also give you more specific instructions. Your treatment has been planned according to current medical practices, but problems sometimes occur. Call your health care provider if you have any problems or questions after your procedure. What can I expect after the procedure? After the procedure, it is common to have:  Vomiting.  A sore throat.  Mental slowness. It is common to feel:  Nauseous.  Cold or shivery.  Sleepy.  Tired.  Sore or achy, even in parts of your body where you did not have surgery. Follow these instructions at home: For at least 24 hours after the procedure:  Do not:  Participate in activities where you could fall or become injured.  Drive.  Use heavy machinery.  Drink alcohol.  Take sleeping pills or medicines that cause drowsiness.  Make important decisions or sign legal documents.  Take care of children on your own.  Rest. Eating and drinking  If you vomit, drink water, juice, or soup when you can drink without vomiting.  Drink enough fluid to keep your urine clear or pale yellow.  Make sure you have little or no nausea before eating solid foods.  Follow the diet recommended by your health care provider. General instructions  Have a responsible adult stay with you until you are awake and alert.  Return to your normal activities as told by your health care provider. Ask your health care provider what activities are safe for you.  Take over-the-counter and prescription medicines only as told by your health care provider.  If you smoke, do not smoke without supervision.  Keep all follow-up visits as told by your health care provider. This is important. Contact a health care provider if:  You continue to have nausea or vomiting at home, and medicines are not helpful.  You  cannot drink fluids or start eating again.  You cannot urinate after 8-12 hours.  You develop a skin rash.  You have fever.  You have increasing redness at the site of your procedure. Get help right away if:  You have difficulty breathing.  You have chest pain.  You have unexpected bleeding.  You feel that you are having a life-threatening or urgent problem. This information is not intended to replace advice given to you by your health care provider. Make sure you discuss any questions you have with your health care provider. Document Released: 11/24/2000 Document Revised: 01/21/2016 Document Reviewed: 08/02/2015 Elsevier Interactive Patient Education  2017 [place]:  Please make an appointment with your physician in 2 week(s).  Call your physician immediately if you have any fevers greater than 102.5, drainage from you wound that is not clear or looks infected, persistent bleeding, increasing abdominal pain, problems urinating, or persistent nausea/vomiting.    WOUND CARE INSTRUCTIONS:  Keep a dry clean dressing on the wound if there is drainage. The initial bandage may be removed after 24 hours.  Once the wound has quit draining you may leave it open to air.  If clothing rubs against the wound or causes irritation and the wound is not draining you may cover it with a dry dressing during the daytime.  Try to keep the wound dry and avoid ointments on the wound unless directed to do so.  If the wound becomes bright red and painful or starts to drain infected material that is not  clear, please contact your physician immediately.  If the wound is mildly pink and has a thick firm ridge underneath it, this is normal, and is referred to as a healing ridge.  This will resolve over the next 4-6 weeks.  DIET:  You may eat any foods that you can tolerate.  It is a good idea to eat a high fiber diet and take in plenty of fluids to prevent constipation.   If you do become constipated you may want to take a mild laxative or take ducolax tablets on a daily basis until your bowel habits are regular.  Constipation can be very uncomfortable, along with straining, after recent abdominal surgery.  ACTIVITY:  You are encouraged to cough and deep breath or use your incentive spirometer if you were given one, every 15-30 minutes when awake.  This will help prevent respiratory complications and low grade fevers post-operatively.  You may want to hug a pillow when coughing and sneezing to add additional support to the surgical area which will decrease pain during these times.  You are encouraged to walk and engage in light activity for the next two weeks.  You should not lift more than 20 pounds during this time frame as it could put you at increased risk for a hernia recurrence.  Twenty pounds is roughly equivalent to a plastic bag of groceries.    MEDICATIONS:  Try to take narcotic medications and anti-inflammatory medications, such as tylenol, ibuprofen, naprosyn, etc., with food.  This will minimize stomach upset from the medication.  Should you develop nausea and vomiting from the pain medication, or develop a rash, please discontinue the medication and contact your physician.  You should not drive, make important decisions, or operate machinery when taking narcotic pain medication.  QUESTIONS:  Please feel free to call your physician or the hospital operator if you have any questions, and they will be glad to assist you.

## 2016-08-06 NOTE — Anesthesia Procedure Notes (Signed)
Procedure Name: LMA Insertion Date/Time: 08/06/2016 12:55 PM Performed by: Janna Arch Pre-anesthesia Checklist: Patient identified, Emergency Drugs available, Suction available and Patient being monitored Patient Re-evaluated:Patient Re-evaluated prior to inductionOxygen Delivery Method: Circle system utilized Preoxygenation: Pre-oxygenation with 100% oxygen Intubation Type: IV induction Ventilation: Mask ventilation without difficulty LMA: LMA inserted LMA Size: 5.0

## 2016-08-06 NOTE — Brief Op Note (Signed)
08/06/2016  2:24 PM  PATIENT:  Jonathan Escobar  70 y.o. male  PRE-OPERATIVE DIAGNOSIS:  RIGHT INGUINAL HERNIA K40.90  POST-OPERATIVE DIAGNOSIS:  RIGHT INGUINAL HERNIA K40.90  PROCEDURE:  Procedure(s): HERNIA REPAIR INGUINAL ADULT (Right)  SURGEON:  Surgeon(s) and Role:    * Clayburn Pert, MD - Primary  PHYSICIAN ASSISTANT:   ASSISTANTS: none   ANESTHESIA:   general  EBL:  Total I/O In: -  Out: 15 [Blood:15]  BLOOD ADMINISTERED:none  DRAINS: none   LOCAL MEDICATIONS USED:  LIDOCAINE  and OTHER Exparell  SPECIMEN:  No Specimen  DISPOSITION OF SPECIMEN:  N/A  COUNTS:  YES  TOURNIQUET:  * No tourniquets in log *  DICTATION: .Dragon Dictation  PLAN OF CARE: Discharge to home after PACU  PATIENT DISPOSITION:  PACU - hemodynamically stable.   Delay start of Pharmacological VTE agent (>24hrs) due to surgical blood loss or risk of bleeding: not applicable

## 2016-08-06 NOTE — Anesthesia Preprocedure Evaluation (Signed)
Anesthesia Evaluation  Patient identified by MRN, date of birth, ID band Patient awake    Reviewed: Allergy & Precautions, H&P , NPO status , Patient's Chart, lab work & pertinent test results  Airway Mallampati: II  TM Distance: >3 FB Neck ROM: full    Dental no notable dental hx.    Pulmonary former smoker,    Pulmonary exam normal        Cardiovascular hypertension, + CAD and + Past MI  Normal cardiovascular exam     Neuro/Psych    GI/Hepatic GERD  ,  Endo/Other    Renal/GU      Musculoskeletal   Abdominal   Peds  Hematology   Anesthesia Other Findings   Reproductive/Obstetrics                             Anesthesia Physical Anesthesia Plan  ASA: III  Anesthesia Plan: General LMA   Post-op Pain Management:    Induction:   Airway Management Planned:   Additional Equipment:   Intra-op Plan:   Post-operative Plan:   Informed Consent: I have reviewed the patients History and Physical, chart, labs and discussed the procedure including the risks, benefits and alternatives for the proposed anesthesia with the patient or authorized representative who has indicated his/her understanding and acceptance.     Plan Discussed with:   Anesthesia Plan Comments:         Anesthesia Quick Evaluation

## 2016-08-06 NOTE — Anesthesia Postprocedure Evaluation (Signed)
Anesthesia Post Note  Patient: Jonathan Escobar  Procedure(s) Performed: Procedure(s) (LRB): HERNIA REPAIR INGUINAL ADULT (Right)  Patient location during evaluation: PACU Anesthesia Type: General Level of consciousness: awake and alert and oriented Pain management: satisfactory to patient Vital Signs Assessment: post-procedure vital signs reviewed and stable Respiratory status: spontaneous breathing, nonlabored ventilation and respiratory function stable Cardiovascular status: blood pressure returned to baseline and stable Postop Assessment: Adequate PO intake and No signs of nausea or vomiting Anesthetic complications: no    Raliegh Ip

## 2016-08-06 NOTE — Op Note (Signed)
Pre-operative Diagnosis: Right inguinal hernia  Post-operative Diagnosis: Indirect right inguinal hernia  Procedure performed: Open right inguinal hernia repair  Surgeon: Clayburn Pert   Assistants: None  Anesthesia: General LMA anesthesia  ASA Class: 2  Surgeon: Clayburn Pert, MD FACS  Anesthesia: Gen. with endotracheal tube  Assistant: None  Procedure Details  The patient was seen again in the Holding Room. The benefits, complications, treatment options, and expected outcomes were discussed with the patient. The risks of bleeding, infection, recurrence of symptoms, failure to resolve symptoms,  bowel injury, any of which could require further surgery were reviewed with the patient.   The patient was taken to Operating Room, identified as Jonathan Escobar and the procedure verified.  A Time Out was held and the above information confirmed.  Prior to the induction of general anesthesia, antibiotic prophylaxis was administered. VTE prophylaxis was in place. General endotracheal anesthesia was then administered and tolerated well. After the induction, the abdomen was prepped with Chloraprep and draped in the sterile fashion. The patient was positioned in the supine position.  The right ASIS and pubic tubercle were again marked followed by marking of our planned incision site over the inguinal canal. This was then localized with 1% lidocaine solution. The skin was incised with 11 blade scalpel and using a combination of blunt dissection and Bovie electrocautery was taken down to the level of the external oblique fascia. The external oblique fascia was then identified and incised with a 15 blade scalpel and opened up in the direction of its fibers down to the external inguinal ring. The external oblique fascias were then grasped with hemostat forceps and the spermatic cord was bluntly dissected out and elevated and isolated with a Penrose drain. Our operative field was held open using a  self-retaining retractor.  With the spermatic cord elevated and isolated the indirect inguinal hernia was easily identified. It was dissected free from its cord structures taking care to preserve the vas deferens as well as the testicular vasculature. A large cord lipoma was suture-ligated with 2-0 silk suture then excised and passed off the field. The indirect space was noted to be large and lateral to the cord. It was reapproximated loosely with interrupted 2-0 silk suture. The direct space was noted to be intact. The inguinal nerve was identified and sharply dissected out and excised. A right-sided parietex Pro-Grip Mesh was brought up to the field. It was cut to the appropriate size. It was placed around the cord and able to be laid flat covering both the direct and indirect spaces. It was then secured to the pubic tubercle and the conjoined tendon with interrupted 2-0 Vicryl suture.  The operative field was copiously irrigated. Meticulous hemostasis was insured. Next Experel local anesthetic is brought up to the field and an inguinal nerve block as well as field block was obtained with this long acting local anesthetic. With the field re-localized the external oblique fascia was then closed with a running 2-0 Vicryl suture. The Scarpa's fascia and deep dermal tissue was reapproximated with an interrupted 3-0 Vicryl suture. The skin was reapproximated with a running subcuticular 4-0 Monocryl. It was then sealed with Dermabond. The patient tolerated the procedure well. All counts are correct at the end of the procedure. He was awoken from general anesthesia and transferred to the recovery area without any immediate complications.  Findings: Indirect inguinal hernia   Estimated Blood Loss: 10 mL         Drains: None  Specimens: None          Complications: None                  Condition: Good   Clayburn Pert, MD, FACS

## 2016-08-06 NOTE — H&P (Signed)
Patient ID: Jonathan Escobar, male   DOB: 1946-02-08, 70 y.o.   MRN: [id number]  CC: Right groin bulge  HPI Jonathan Escobar is a 70 y.o. male who presents to clinic today for evaluation of a right groin bulge. Patient states he first noticed it about a month ago after a hard day at work and then again after lifting something heavy at home. He states there is discomfort to the area when it is out immediately reduces when he rests or lays back. He had an acute event causing him to seek care in the ER for his hernia. It was reduced and he has done well since. It does not radiate and does not move. He denies any fevers, chills, nausea, vomiting, chest pain, shortness of breath, diarrhea, constipation. He states it feels very similar to the hernia from his left side that was repaired greater than 30 years ago.  HPI      Past Medical History:  Diagnosis Date  . CAD (coronary artery disease), autologous vein bypass graft Nov 2014   Inf STEMI - PCI to SVG-RPDA:   . CAD S/P percutaneous coronary angioplasty 1999   BMS to prox RCA 1999; Brachytherapy ~2003, followed by DES  PCI ([facility]) for ISR.  Marland Kitchen Coronary stent restenosis due to progression of disease 2000; 2005   (While Still In [place]) Status post Brachytherapy to RCA ISR - 2000; Redo PCI with DES 2005  . Essential hypertension   . History of GI bleed     while on Plavix  . History of viral meningitis    following GI bleed   . History of: ST elevation myocardial infarction (STEMI) of inferior wall, subsequent episode of care April 25, 2012;   RCA: 95% ISR followed by 100% stenosis post-stent with significant RPL lesions; LAD: Tandem 80 and 70% lesions involving D1 (1, 1, 1) --> initial angioplasty to RCA aspiration thrombectomy --> urgent CABG;  . Hyperlipidemia LDL goal <70   . Obesity (BMI 30.0-34.9)   . S/P CABG x 4: LIMA-LAD, seq SVG-PD-PLA; SVG-diag. 04/28/2012  . S/P coronary artery stent placement Nov-Dec 2014   a) Inf  STEMIL 11/28/'14 --> extensive (full metal Jacket) of SVG-RPDA Promus DES - 3 mm x 38 mm, 3 mm x 38 mm, 3 mm x 20 mm, 2.75 mm x 38 mm -- all postdilated to ~3.3 mm;; b) 12/1/'14: staged PCI to distal SVG-diagonal: Xience Xpedition 2.5 mm x 20 mm (2.66 mm)  . ST elevation myocardial infarction (STEMI) of inferior wall, subsequent episode of care [facility])  Jul 29, 2013   Diffuse near occlusion of SVG-rPDA, 90% SVG-Diag         Past Surgical History:  Procedure Laterality Date  . CARDIAC CATHETERIZATION  04/28/12   RCA: 95% ISR followed by 100% stenosis post-stent with significant RPL lesions; LAD: Tandem 80 and 70% lesions involving D1 (1, 1, 1) --> initial angioplasty to RCA aspiration thrombectomy --> urgent CABG;  . CORONARY ARTERY BYPASS GRAFT  04/28/2012   Procedure: CORONARY ARTERY BYPASS GRAFTING (CABG);  Surgeon: Gaye Pollack, MD;  Location: [place];  Service: Open Heart Surgery;  Laterality: N/A;  . HERNIA REPAIR    . LEFT AND RIGHT HEART CATHETERIZATION WITH CORONARY ANGIOGRAM N/A 04/25/2012   Procedure: LEFT AND RIGHT HEART CATHETERIZATION WITH CORONARY ANGIOGRAM;  Surgeon: Leonie Man, MD;  Location: [facility] CATH LAB;  Service: Cardiovascular;  Laterality: N/A;  . LEFT HEART CATHETERIZATION WITH CORONARY/GRAFT ANGIOGRAM  07/29/2013   Procedure:  LEFT HEART CATHETERIZATION WITH Beatrix Fetters;  Surgeon: Blane Ohara, MD;  Location: [facility] CATH LAB;  Service: Cardiovascular; ; 95% stenosis with percent mid occlusion of SVG-RPDA; 90% anastomotic SVG-diagonal, moderate 50% disease in the native circumflex with patent OM 1 OM 2. 80-90% proximal LAD with patent LIMA to LAD , EF 50%  . MOHS SURGERY    . NM MYOVIEW LTD  Jan 2015   Moderate Inferior infarct (as expected), but no ischemia & EF ~45-50%  . PERCUTANEOUS CORONARY STENT INTERVENTION (PCI-S)  07/29/2013   Procedure: PERCUTANEOUS CORONARY STENT INTERVENTION (PCI-S);  Surgeon: Blane Ohara, MD;  Location: [facility] CATH  LAB;  Service: Cardiovascular;; PCI to SVG-RPDA: Promus DES - 3 mm x 38 mm, 3 mm x 38 mm, 3 mm x 20 mm, 2.75 mm x 38 mm -- all postdilated to ~3.3 mm;  . PERCUTANEOUS CORONARY STENT INTERVENTION (PCI-S) N/A 08/01/2013   Procedure: PERCUTANEOUS CORONARY STENT INTERVENTION (PCI-S);  Surgeon: Leonie Man, MD;  Location: [facility] CATH LAB;  Service: Cardiovascular;staged PCI to distal SVG-diagonal: Xience Xpedition 2.5 mm x 20 mm (2.66 mm)    . TRANSTHORACIC ECHOCARDIOGRAM  12/'13; 07/30/2013   a) Post CABG: EF 50-55%, moderate HK of anteroseptal wall, septal dyskinesis/dyssynergy due to poststernotomy;  grade 1 diastolic dysfunction. Mildly dilated LA, mildly reduced RV function-likely due to septal dyssynergy.; b) 11/'14 Post Inferior STEMI: EF 50-55% mild LVH. Mild hypokinesis of the distal lateral, and basal to mid inferior lateral and inferior walls -- consistent with RCA infarct         Family History  Problem Relation Age of Onset  . Alzheimer's disease Mother   . Heart failure Father   . Coronary artery disease Father   . Valvular heart disease Sister     Social History       Social History  Substance Use Topics  . Smoking status: Former Smoker    Packs/day: 1.00    Years: 10.00    Types: Cigarettes    Quit date: 09/01/1976  . Smokeless tobacco: Never Used  . Alcohol use 8.4 oz/week     14 Glasses of wine per week          Allergies  Allergen Reactions  . Ace Inhibitors Other (See Comments)    Cough  . Norvasc [Amlodipine Besylate]     Dizzy  . Amoxicillin Rash    Flu like symptoms          Current Outpatient Prescriptions  Medication Sig Dispense Refill  . acetaminophen (TYLENOL) 325 MG tablet Take 2 tablets (650 mg total) by mouth every 4 (four) hours as needed for headache or mild pain.    Marland Kitchen ALPRAZolam (XANAX) 0.25 MG tablet Take 0.125 mg by mouth at bedtime as needed for sleep.    . Ascorbic Acid (VITAMIN C) 1000 MG tablet Take  1,000 mg by mouth daily.    Marland Kitchen aspirin 81 MG tablet Take 81 mg by mouth daily.    Marland Kitchen atorvastatin (LIPITOR) 20 MG tablet Take 1 tablet (20 mg total) by mouth daily at 6 PM. 90 tablet 0  . cetirizine (ZYRTEC) 10 MG tablet Take 10 mg by mouth daily.    . clopidogrel (PLAVIX) 75 MG tablet Take 1 tablet (75 mg total) by mouth daily with breakfast. 90 tablet 3  . metoprolol succinate (TOPROL-XL) 50 MG 24 hr tablet Take 1 tablet (50 mg total) by mouth daily. 90 tablet 3  . nitroGLYCERIN (NITROSTAT) 0.4 MG SL tablet Place  1 tablet (0.4 mg total) under the tongue every 5 (five) minutes x 3 doses as needed for chest pain. 25 tablet 3  . pantoprazole (PROTONIX) 40 MG tablet Take 1 tablet (40 mg total) by mouth daily at 6 (six) AM. 90 tablet 3  . valsartan (DIOVAN) 320 MG tablet Take 1 tablet (320 mg total) by mouth daily. 90 tablet 3   No current facility-administered medications for this visit.      Review of Systems A Multi-point review of systems was asked and was negative except for the findings documented in the history of present illness  Physical Exam Height 5\' 9"  (1.753 m), weight 93.4 kg (206 lb). CONSTITUTIONAL: No acute distress. EYES: Pupils are equal, round, and reactive to light, Sclera are non-icteric. EARS, NOSE, MOUTH AND THROAT: The oropharynx is clear. The oral mucosa is pink and moist. Hearing is intact to voice. LYMPH NODES:  Lymph nodes in the neck are normal. RESPIRATORY:  Lungs are clear. There is normal respiratory effort, with equal breath sounds bilaterally, and without pathologic use of accessory muscles. CARDIOVASCULAR: Heart is regular without murmurs, gallops, or rubs. GI: The abdomen is soft, nontender, and nondistended. There is a palpable right inguinal hernia on exam that is easily reducible but will propel with Valsalva. Left side has a well-healed scar from an open repair without any evidence of impulse or fascial defect. There is no hepatosplenomegaly.  There are normal bowel sounds in all quadrants. GU: Rectal deferred.   MUSCULOSKELETAL: Normal muscle strength and tone. No cyanosis or edema.   SKIN: Turgor is good and there are no pathologic skin lesions or ulcers. NEUROLOGIC: Motor and sensation is grossly normal. Cranial nerves are grossly intact. PSYCH:  Oriented to person, place and time. Affect is normal.  Data Reviewed There are no images or labs to review for this encounter I have personally reviewed the patient's imaging, laboratory findings and medical records.    Assessment    Right inguinal hernia    Plan    70 year old male with a right inguinal hernia on exam today. Discussed at length the signs and symptoms of incarceration or strangulation and to seek medical care immediately should they occur. Patient was seen in the ER since his last visit for an incarcation that reduced. Ready for surgery. Surgery again discussed and an open right inguinal hernia repair will be performed today.   Clayburn Pert, MD FACS General Surgeon 08/06/2016 12:27

## 2016-08-07 ENCOUNTER — Encounter: Payer: Self-pay | Admitting: General Surgery

## 2016-08-14 ENCOUNTER — Encounter: Payer: Self-pay | Admitting: Surgery

## 2016-08-14 ENCOUNTER — Ambulatory Visit (INDEPENDENT_AMBULATORY_CARE_PROVIDER_SITE_OTHER): Payer: 59 | Admitting: Surgery

## 2016-08-14 VITALS — BP 142/90 | HR 59 | Temp 98.4°F | Ht 69.0 in | Wt 212.6 lb

## 2016-08-14 DIAGNOSIS — Z09 Encounter for follow-up examination after completed treatment for conditions other than malignant neoplasm: Secondary | ICD-10-CM

## 2016-08-14 NOTE — Progress Notes (Signed)
S/p open RIH  12/6 by Dr. Adonis Huguenin Doing well , some swelling Back on ASA and plavix Some pain that is improving Taking po and ambulating  PE NAD Abd: soft, incision healing well, no recurrence, no hematoma or infection  A/P doing well after hernia repair No heavy lifting RTW on 12/26th No surgical issues at this time RTC prn

## 2016-08-14 NOTE — Patient Instructions (Signed)
Please have your FMLA and/or disability paperwork faxed to [phone]. We will fill this out as soon as we receive this. Please see your return to work note given.  Please call our office with any questions or concerns.  Please do not submerge in a tub, hot tub, or pool until incisions are completely sealed.  Use sun block to incision area over the next year if this area will be exposed to sun. This helps decrease scarring.  You may now resume your normal activities. Listen to your body when lifting, if you have pain when lifting, stop and then try again in a few days.  If you develop redness, drainage, or pain at incision sites- call our office immediately and speak with a nurse.    Hematoma A hematoma is a collection of blood under the skin, in an organ, in a body space, in a joint space, or in other tissue. The blood can clot to form a lump that you can see and feel. The lump is often firm and may sometimes become sore and tender. Most hematomas get better in a few days to weeks. However, some hematomas may be serious and require medical care. Hematomas can range in size from very small to very large. What are the causes? A hematoma can be caused by a blunt or penetrating injury. It can also be caused by spontaneous leakage from a blood vessel under the skin. Spontaneous leakage from a blood vessel is more likely to occur in older people, especially those taking blood thinners. Sometimes, a hematoma can develop after certain medical procedures. What are the signs or symptoms?  A firm lump on the body.  Possible pain and tenderness in the area.  Bruising.Blue, dark blue, purple-red, or yellowish skin may appear at the site of the hematoma if the hematoma is close to the surface of the skin. For hematomas in deeper tissues or body spaces, the signs and symptoms may be subtle. For example, an intra-abdominal hematoma may cause abdominal pain, weakness, fainting, and shortness of breath.  An intracranial hematoma may cause a headache or symptoms such as weakness, trouble speaking, or a change in consciousness. How is this diagnosed? A hematoma can usually be diagnosed based on your medical history and a physical exam. Imaging tests may be needed if your health care provider suspects a hematoma in deeper tissues or body spaces, such as the abdomen, head, or chest. These tests may include ultrasonography or a CT scan. How is this treated? Hematomas usually go away on their own over time. Rarely does the blood need to be drained out of the body. Large hematomas or those that may affect vital organs will sometimes need surgical drainage or monitoring. Follow these instructions at home:  Apply ice to the injured area:  Put ice in a plastic bag.  Place a towel between your skin and the bag.  Leave the ice on for 20 minutes, 2-3 times a day for the first 1 to 2 days.  After the first 2 days, switch to using warm compresses on the hematoma.  Elevate the injured area to help decrease pain and swelling. Wrapping the area with an elastic bandage may also be helpful. Compression helps to reduce swelling and promotes shrinking of the hematoma. Make sure the bandage is not wrapped too tight.  If your hematoma is on a lower extremity and is painful, crutches may be helpful for a couple days.  Only take over-the-counter or prescription medicines as directed by your  health care provider. Get help right away if:  You have increasing pain, or your pain is not controlled with medicine.  You have a fever.  You have worsening swelling or discoloration.  Your skin over the hematoma breaks or starts bleeding.  Your hematoma is in your chest or abdomen and you have weakness, shortness of breath, or a change in consciousness.  Your hematoma is on your scalp (caused by a fall or injury) and you have a worsening headache or a change in alertness or consciousness. This information is not  intended to replace advice given to you by your health care provider. Make sure you discuss any questions you have with your health care provider. Document Released: 04/01/2004 Document Revised: 01/24/2016 Document Reviewed: 01/26/2013 Elsevier Interactive Patient Education  2017 [employer].

## 2016-08-19 ENCOUNTER — Telehealth: Payer: Self-pay

## 2016-08-19 NOTE — Telephone Encounter (Signed)
Patient's disability form from [employer] was filled out and faxed.

## 2016-08-28 ENCOUNTER — Ambulatory Visit: Payer: 59

## 2016-08-28 DIAGNOSIS — R0989 Other specified symptoms and signs involving the circulatory and respiratory systems: Secondary | ICD-10-CM | POA: Diagnosis not present

## 2016-08-28 LAB — VAS US CAROTID
LCCADDIAS: -32 cm/s
LCCADSYS: -120 cm/s
LCCAPSYS: 139 cm/s
LEFT ECA DIAS: -23 cm/s
LEFT VERTEBRAL DIAS: -17 cm/s
LICADSYS: -77 cm/s
Left CCA prox dias: 27 cm/s
Left ICA dist dias: -26 cm/s
Left ICA prox dias: -27 cm/s
Left ICA prox sys: -100 cm/s
RCCADSYS: -90 cm/s
RCCAPSYS: -75 cm/s
RIGHT ECA DIAS: -11 cm/s
RIGHT VERTEBRAL DIAS: -13 cm/s
Right CCA prox dias: -15 cm/s

## 2016-09-22 DIAGNOSIS — M62830 Muscle spasm of back: Secondary | ICD-10-CM | POA: Diagnosis not present

## 2016-09-22 DIAGNOSIS — M545 Low back pain: Secondary | ICD-10-CM | POA: Diagnosis not present

## 2016-09-22 DIAGNOSIS — M9903 Segmental and somatic dysfunction of lumbar region: Secondary | ICD-10-CM | POA: Diagnosis not present

## 2016-09-24 DIAGNOSIS — M62830 Muscle spasm of back: Secondary | ICD-10-CM | POA: Diagnosis not present

## 2016-09-24 DIAGNOSIS — M545 Low back pain: Secondary | ICD-10-CM | POA: Diagnosis not present

## 2016-09-24 DIAGNOSIS — M9903 Segmental and somatic dysfunction of lumbar region: Secondary | ICD-10-CM | POA: Diagnosis not present

## 2016-09-26 DIAGNOSIS — M9903 Segmental and somatic dysfunction of lumbar region: Secondary | ICD-10-CM | POA: Diagnosis not present

## 2016-09-26 DIAGNOSIS — M62830 Muscle spasm of back: Secondary | ICD-10-CM | POA: Diagnosis not present

## 2016-09-26 DIAGNOSIS — M545 Low back pain: Secondary | ICD-10-CM | POA: Diagnosis not present

## 2016-09-30 DIAGNOSIS — M545 Low back pain: Secondary | ICD-10-CM | POA: Diagnosis not present

## 2016-10-02 ENCOUNTER — Encounter: Payer: Self-pay | Admitting: Physical Therapy

## 2016-10-02 ENCOUNTER — Ambulatory Visit: Payer: 59 | Attending: Physician Assistant | Admitting: Physical Therapy

## 2016-10-02 DIAGNOSIS — M6281 Muscle weakness (generalized): Secondary | ICD-10-CM | POA: Diagnosis not present

## 2016-10-02 DIAGNOSIS — M545 Low back pain, unspecified: Secondary | ICD-10-CM

## 2016-10-02 NOTE — Therapy (Signed)
[facility] MAIN [facility] SERVICES [address] [place], [place], [address] Phone: [phone]   Fax:  [phone]  Physical Therapy Evaluation  Patient Details  Name: Jonathan Escobar MRN: [id number] Date of Birth: March 15, 1946 Referring Provider: Anette Riedel PA, Shon Baton MD  Encounter Date: 10/02/2016      PT End of Session - 10/02/16 1412    Visit Number 1   Number of Visits 13   Date for PT Re-Evaluation 11/13/16   Authorization Type UMR   PT Start Time 1300   PT Stop Time 1355   PT Time Calculation (min) 55 min   Activity Tolerance Patient tolerated treatment well;No increased pain   Behavior During Therapy WFL for tasks assessed/performed      Past Medical History:  Diagnosis Date  . CAD (coronary artery disease), autologous vein bypass graft Nov 2014   Inf STEMI - PCI to SVG-RPDA:   . CAD S/P percutaneous coronary angioplasty 1999   BMS to prox RCA 1999; Brachytherapy ~2003, followed by DES  PCI ([facility]) for ISR.  Marland Kitchen Coronary stent restenosis due to progression of disease 2000; 2005   (While Still In [place]) Status post Brachytherapy to RCA ISR - 2000; Redo PCI with DES 2005  . Essential hypertension    CONTROLLED ON MEDS  . GERD (gastroesophageal reflux disease)   . History of GI bleed     while on Plavix  . History of viral meningitis    following GI bleed   . History of: ST elevation myocardial infarction (STEMI) of inferior wall, subsequent episode of care April 25, 2012;   RCA: 95% ISR followed by 100% stenosis post-stent with significant RPL lesions; LAD: Tandem 80 and 70% lesions involving D1 (1, 1, 1) --> initial angioplasty to RCA aspiration thrombectomy --> urgent CABG;  . Hyperlipidemia LDL goal <70   . Obesity (BMI 30.0-34.9)   . S/P CABG x 4: LIMA-LAD, seq SVG-PD-PLA; SVG-diag. 04/28/2012  . S/P coronary artery stent placement Nov-Dec 2014   a) Inf STEMIL 11/28/'14 --> extensive (full metal Jacket) of SVG-RPDA Promus DES  - 3 mm x 38 mm, 3 mm x 38 mm, 3 mm x 20 mm, 2.75 mm x 38 mm -- all postdilated to ~3.3 mm;; b) 12/1/'14: staged PCI to distal SVG-diagonal: Xience Xpedition 2.5 mm x 20 mm (2.66 mm)  . ST elevation myocardial infarction (STEMI) of inferior wall, subsequent episode of care [facility])  Jul 29, 2013   Diffuse near occlusion of SVG-rPDA, 90% SVG-Diag    Past Surgical History:  Procedure Laterality Date  . CARDIAC CATHETERIZATION  04/28/12   RCA: 95% ISR followed by 100% stenosis post-stent with significant RPL lesions; LAD: Tandem 80 and 70% lesions involving D1 (1, 1, 1) --> initial angioplasty to RCA aspiration thrombectomy --> urgent CABG;  . CORONARY ARTERY BYPASS GRAFT  04/28/2012   Procedure: CORONARY ARTERY BYPASS GRAFTING (CABG);  Surgeon: Alleen Borne, MD;  Location: [facility] OR;  Service: Open Heart Surgery;  Laterality: N/A;  . HERNIA REPAIR Left   . INGUINAL HERNIA REPAIR Right 08/06/2016   Procedure: HERNIA REPAIR INGUINAL ADULT;  Surgeon: Ricarda Frame, MD;  Location: [facility];  Service: General;  Laterality: Right;  . LEFT AND RIGHT HEART CATHETERIZATION WITH CORONARY ANGIOGRAM N/A 04/25/2012   Procedure: LEFT AND RIGHT HEART CATHETERIZATION WITH CORONARY ANGIOGRAM;  Surgeon: Marykay Lex, MD;  Location: [facility] CATH LAB;  Service: Cardiovascular;  Laterality: N/A;  . LEFT HEART CATHETERIZATION WITH CORONARY/GRAFT  ANGIOGRAM  07/29/2013   Procedure: LEFT HEART CATHETERIZATION WITH Isabel Caprice;  Surgeon: Micheline Chapman, MD;  Location: [facility] CATH LAB;  Service: Cardiovascular; ; 95% stenosis with percent mid occlusion of SVG-RPDA; 90% anastomotic SVG-diagonal, moderate 50% disease in the native circumflex with patent OM 1 OM 2. 80-90% proximal LAD with patent LIMA to LAD , EF 50%  . MOHS SURGERY     FOREHEAD  . NM MYOVIEW LTD  Jan 2015   Moderate Inferior infarct (as expected), but no ischemia & EF ~45-50%  . PERCUTANEOUS CORONARY STENT INTERVENTION (PCI-S)  07/29/2013    Procedure: PERCUTANEOUS CORONARY STENT INTERVENTION (PCI-S);  Surgeon: Micheline Chapman, MD;  Location: [facility] CATH LAB;  Service: Cardiovascular;; PCI to SVG-RPDA: Promus DES - 3 mm x 38 mm, 3 mm x 38 mm, 3 mm x 20 mm, 2.75 mm x 38 mm -- all postdilated to ~3.3 mm;  . PERCUTANEOUS CORONARY STENT INTERVENTION (PCI-S) N/A 08/01/2013   Procedure: PERCUTANEOUS CORONARY STENT INTERVENTION (PCI-S);  Surgeon: Marykay Lex, MD;  Location: [facility] CATH LAB;  Service: Cardiovascular;staged PCI to distal SVG-diagonal: Xience Xpedition 2.5 mm x 20 mm (2.66 mm)    . TRANSTHORACIC ECHOCARDIOGRAM  12/'13; 07/30/2013   a) Post CABG: EF 50-55%, moderate HK of anteroseptal wall, septal dyskinesis/dyssynergy due to poststernotomy;  grade 1 diastolic dysfunction. Mildly dilated LA, mildly reduced RV function-likely due to septal dyssynergy.; b) 11/'14 Post Inferior STEMI: EF 50-55% mild LVH. Mild hypokinesis of the distal lateral, and basal to mid inferior lateral and inferior walls -- consistent with RCA infarct    There were no vitals filed for this visit.       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 15-20 min; has less pain when sitting towards right side with decreased lumbar  lordosis;    How long can you stand comfortably? has pain in SI joints when standing; stands for 13 hour days; has pain but able to push through; worse pain with longer standing;    How long can you walk comfortably? R SI Joint pain with walking;    Diagnostic tests X-ray- normal, no abnormality noted;    Patient Stated Goals Get back to normal, reduce pain, return to daily activities;    Currently in Pain? Yes   Pain Score 2    Pain Location Sacrum   Pain Orientation Right   Pain Descriptors / Indicators Aching   Pain Onset 1 to 4 weeks ago   Pain Frequency Constant   Aggravating Factors  prolonged standing   Pain Relieving Factors change positions, ice helps, good results from chiropractor care; pressure along R SI joint pain;    Effect of Pain on Daily Activities decreased mobility;  [facility] PT Assessment - 10/02/16 0001      Assessment   Medical Diagnosis Low back pain   Referring Provider [facility] PA, Brooks MD   Onset Date/Surgical Date 09/01/16   Hand Dominance Right   Next MD Visit in 1 month   Prior Therapy has had chiropractor care for 2 weeks, not seeing now; hasn't had any PT for this condition;      Precautions   Precautions None     Restrictions   Weight Bearing Restrictions No     Balance Screen   Has the patient fallen in the past 6 months No   Has the patient had a decrease in activity level because of a fear of falling?  No   Is the patient reluctant to leave their home because of a fear of falling?  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   Vocation Requirements 12-15 hour shifts, will modify some lifting;    Leisure shooting sports, golf, hans't been able to do a lot of work  around the house;      Cognition   Overall Cognitive Status Within Functional Limits for tasks assessed     Observation/Other Assessments   Modified Oswertry 32% (moderate disability)     Sensation   Light Touch Appears Intact   Stereognosis Appears Intact   Hot/Cold Appears Intact   Proprioception Appears Intact     Coordination   Gross Motor Movements are Fluid and Coordinated Yes   Fine Motor Movements are Fluid and Coordinated Yes     Posture/Postural Control   Posture Comments demonstrates increased thoracic kyphosis and reduce lumbar lordosis which is fixed curvature;      AROM   Overall AROM Comments BUE and BLE is [facility]   Lumbar Flexion 30   Lumbar Extension 25   Lumbar - Right Side Bend 18   Lumbar - Left Side Bend 28   Lumbar - Right Rotation --  no pain, functional   Lumbar - Left Rotation --  has pain, limited;      Strength   Overall Strength Comments Grossly WNL without pain with exception: Hip extension: R: 4-/5, L 4/5     Palpation   Palpation comment reports moderate tenderness to right Quadratus lumborum and erector spinae muscles; increased tightness noted;      FABER test   findings Negative   Side Right   Comment bilaterally     Slump test   Findings Negative   Side Right   Comment bilaterally;     Prone Knee Bend Test   Findings Positive   Side Right     Pelvic Dictraction   Findings Positive   Side  Right     Pelvic Compression   Findings Positive   Side Right     Gaenslen's test   Findings Positive   Side  Right     Transfers   Comments able to transfer sit<>Stand without pushing on chair;  has slight discomfort in sacrum;      Ambulation/Gait   Gait Comments ambulates with normal reciprocal gait pattern; no difficulty;      Standardized Balance Assessment   Five times sit to stand comments  13.5  sec without pushing on chair (<15 sec indicates low fall risk)   10 Meter Walk 1.33 m/s without AD (low fall risk)     High  Level Balance   High Level Balance Comments normal static and dynamic standing balance;       Special Tests: (+) long sit test with RLE going short to long indicating possible right posterior innominant rotation; (+) prone knee bend test (BLE) with RLE going short to long also indicating possible posterior innominant rotation;  TREATMENT: PT initiated HEP: Supine: Knee to chest stretch 15 sec hold x1 bilaterally; Lumbar trunk rotation to left side only x1 min; Posterior pelvic tilt 3 sec hold x4 with cues for breath support; Small bridge x5 reps; Patient in standing against wall: Posterior/anterior pelvic rocks x5 reps with cues to avoid excessive ROM for better lower core strengthening; Patient required min-moderate verbal/tactile cues for correct exercise technique.                      PT Education - 10/02/16 1412    Education provided Yes   Education Details HEP initiated, plan of care/recommendations;    Person(s) Educated Patient   Methods Explanation;Verbal cues;Handout   Comprehension Verbalized understanding;Returned demonstration;Verbal cues required             PT Long Term Goals - 10/02/16 1418      PT LONG TERM GOAL #1   Title Patient will be independent in home exercise program to improve strength/mobility for better functional independence with ADLs.   Time 6   Period Weeks   Status New     PT LONG TERM GOAL #2   Title Patient will report a worst pain of 3/10 on VAS in    low back         to improve tolerance with ADLs and reduced symptoms with activities.    Time 6   Period Weeks   Status New     PT LONG TERM GOAL #3   Title Patient will reduce modified Oswestry score to <20 as to demonstrate minimal disability with ADLs including improved sleeping tolerance, walking/sitting tolerance etc for better mobility with ADLs.    Time 6   Period Weeks   Status New     PT LONG TERM GOAL #4   Title Patient will demonstrate improved lumbar  ROM: flexion: >40 degrees, right lateral flexion> 20 degrees without pain as to return to PLOF.    Time 6   Period Weeks   Status New               Plan - 10/02/16 1413    Clinical Impression Statement 71 yo Male reports increased right sided low back pain in past month. Patient reports pain is localized to right flank and along right SI joint. He reports increased pain with transfers, standing long periods of time and some walking. Patient exhibits increased weakness in right multifidi with decreased muscle tone during hip extension. He also exhibits decreased ROM particularly in lumbar flexion and right lateral flexion. Patient reports moderate tenderness along right lumbar paraspinals. He would benefit from skilled PT intervention to improve lumbosacral strength, flexibility and reduce pain with ADLs;    Rehab Potential Good   Clinical Impairments Affecting Rehab Potential positive: acute injury, minimal co-morbidities, good social support; negative: works long days (12-15 hours); patient's clinical presentation is stable as his pain is localized to right low back and has been improving;    PT Frequency  2x / week   PT Duration 6 weeks   PT Treatment/Interventions Cryotherapy;Electrical Stimulation;Moist Heat;Traction;Therapeutic exercise;Therapeutic activities;Functional mobility training;Stair training;Gait training;Patient/family education;Manual techniques;Passive range of motion;Energy conservation;Dry needling   PT Next Visit Plan work on hip flexor strengthening RLE, manual therapy,    PT Home Exercise Plan initiated see patient instructions;    Consulted and Agree with Plan of Care Patient      Patient will benefit from skilled therapeutic intervention in order to improve the following deficits and impairments:  Decreased endurance, Hypomobility, Pain, Decreased activity tolerance, Decreased strength, Increased fascial restricitons, Decreased mobility, Difficulty walking,  Decreased range of motion, Improper body mechanics, Postural dysfunction, Impaired flexibility  Visit Diagnosis: Acute right-sided low back pain without sciatica - Plan: PT plan of care cert/re-cert  Muscle weakness (generalized) - Plan: PT plan of care cert/re-cert     Problem List Patient Active Problem List   Diagnosis Date Noted  . Non-recurrent unilateral inguinal hernia without obstruction or gangrene   . Right inguinal hernia 07/02/2016  . Personal history of other malignant neoplasm of skin 10/08/2015  . Essential hypertension 07/19/2014  . Fatigue 01/05/2014  . Heart palpitations 09/10/2013  . Obesity (BMI 30-39.9) 08/09/2013  . STEMI 07/29/13- successful complex full metal jacket stenting of the SVG-PDA/PLA -07/29/2013, staged SVG-Dx PCI 12/01 07/29/2013  . CAD (coronary artery disease), autologous vein bypass graft --> full metal jacket PCI of SVG-RCA; PCI of SVG-D1 anastomosis 07/02/2013  . Hyperlipidemia LDL goal <70   . 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 04/29/2012  . ST elevation myocardial infarction (STEMI) of inferior wall, initial episode of care - s/p PTCA of 95% ISR to 70% and 100% distal stent occlusion to ~40% (8/25) 04/25/2012  . H/O: GI bleed - 2007 on Plavix 04/25/2012  . CAD in native artery - prior Inferior MI - RCA PCI; 04/2012 - Inferior STEMI - RCA occluded (unable to dilate lesion adequately) + LAD & D1 disease --> referred for CABG 04/07/2012      Eniyah Eastmond PT, DPT 10/02/2016, 2:21 PM  [facility] MAIN [facility] SERVICES [address] [place], [place], [address] Phone: [phone]   Fax:  [phone]  Name: Jonathan Escobar MRN: [id number] Date of Birth: 09/23/1945

## 2016-10-02 NOTE — Patient Instructions (Addendum)
Pelvic Tilt  Lying on back with knees bent, Flatten back by tightening stomach muscles and rocking hips back Focus on breath control- tighten when exhale Hold for 5 sec, Repeat __10__ times per set. Do __1__ sets per session. Do __2__ sessions per day.  http://orth.exer.us/134    Copyright  VHI. All rights reserved. Knee to Chest (Flexion)   Pull knee toward chest. Feel stretch in lower back or buttock area. Breathing deeply, Hold __15__ seconds. Repeat with other knee. Repeat _2-3___ times. Do _2-3___ sessions per day.  http://[email or web address].exer.us/225   Copyright  VHI. All rights reserved.   Lower Trunk Rotation Stretch  Lying on back with knees bent, Keeping back flat and feet together, rotate knees side to side slowly and in pain free range of motion.  Hold _2___ seconds. Repeat for 1-2 minutes. Do __1__ sets per session. Do __2-3__ sessions per day.  http://orth.exer.us/122   Copyright  VHI. All rights reserved.   Bridge   Lie back, legs bent. Inhale, pressing hips up.  Exhale, rolling down along spine from top. Repeat _10___ times. Do __1-2__ sessions per day.  Copyright  VHI. All rights reserved.    Pelvic Anterior / Posterior Tilt / Pelvic Rock    Stand with upper back and buttocks touching wall, feet ____ inches from wall, knees relaxed. A.Anterior tilt: Raise chest and arch back slightly. B.Posterior tilt: Contract abdominals and flatten back. Rock _10___ times per session. Do __2-3 sets each day; Progression: Perform pelvic tilt away from wall.  Copyright  VHI. All rights reserved.

## 2016-10-06 ENCOUNTER — Ambulatory Visit: Payer: 59 | Admitting: Physical Therapy

## 2016-10-06 ENCOUNTER — Encounter: Payer: Self-pay | Admitting: Physical Therapy

## 2016-10-06 ENCOUNTER — Other Ambulatory Visit: Payer: Self-pay | Admitting: Cardiology

## 2016-10-06 DIAGNOSIS — M545 Low back pain, unspecified: Secondary | ICD-10-CM

## 2016-10-06 DIAGNOSIS — M6281 Muscle weakness (generalized): Secondary | ICD-10-CM | POA: Diagnosis not present

## 2016-10-06 NOTE — Therapy (Signed)
[place] MAIN [facility] SERVICES [address] [place], [place], [address] Phone: [phone]   Fax:  [phone]  Physical Therapy Treatment  Patient Details  Name: Jonathan Escobar MRN: [id number] Date of Birth: 1946/06/07 Referring Provider: Ronette Deter PA, Rolena Infante MD  Encounter Date: 10/06/2016      PT End of Session - 10/06/16 1419    Visit Number 2   Number of Visits 13   Date for PT Re-Evaluation 11/13/16   Authorization Type UMR   PT Start Time [id number]   PT Stop Time 1423   PT Time Calculation (min) 38 min   Activity Tolerance Patient tolerated treatment well;No increased pain   Behavior During Therapy WFL for tasks assessed/performed      Past Medical History:  Diagnosis Date  . CAD (coronary artery disease), autologous vein bypass graft Nov 2014   Inf STEMI - PCI to SVG-RPDA:   . CAD S/P percutaneous coronary angioplasty 1999   BMS to prox RCA 1999; Brachytherapy ~2003, followed by DES  PCI ([place]) for ISR.  Marland Kitchen Coronary stent restenosis due to progression of disease 2000; 2005   (While Still In [place]) Status post Brachytherapy to RCA ISR - 2000; Redo PCI with DES 2005  . Essential hypertension    CONTROLLED ON MEDS  . GERD (gastroesophageal reflux disease)   . History of GI bleed     while on Plavix  . History of viral meningitis    following GI bleed   . History of: ST elevation myocardial infarction (STEMI) of inferior wall, subsequent episode of care April 25, 2012;   RCA: 95% ISR followed by 100% stenosis post-stent with significant RPL lesions; LAD: Tandem 80 and 70% lesions involving D1 (1, 1, 1) --> initial angioplasty to RCA aspiration thrombectomy --> urgent CABG;  . Hyperlipidemia LDL goal <70   . Obesity (BMI 30.0-34.9)   . S/P CABG x 4: LIMA-LAD, seq SVG-PD-PLA; SVG-diag. 04/28/2012  . S/P coronary artery stent placement Nov-Dec 2014   a) Inf STEMIL 11/28/'14 --> extensive (full metal Jacket) of SVG-RPDA Promus DES -  3 mm x 38 mm, 3 mm x 38 mm, 3 mm x 20 mm, 2.75 mm x 38 mm -- all postdilated to ~3.3 mm;; b) 12/1/'14: staged PCI to distal SVG-diagonal: Xience Xpedition 2.5 mm x 20 mm (2.66 mm)  . ST elevation myocardial infarction (STEMI) of inferior wall, subsequent episode of care [facility])  Jul 29, 2013   Diffuse near occlusion of SVG-rPDA, 90% SVG-Diag    Past Surgical History:  Procedure Laterality Date  . CARDIAC CATHETERIZATION  04/28/12   RCA: 95% ISR followed by 100% stenosis post-stent with significant RPL lesions; LAD: Tandem 80 and 70% lesions involving D1 (1, 1, 1) --> initial angioplasty to RCA aspiration thrombectomy --> urgent CABG;  . CORONARY ARTERY BYPASS GRAFT  04/28/2012   Procedure: CORONARY ARTERY BYPASS GRAFTING (CABG);  Surgeon: Gaye Pollack, MD;  Location: [place];  Service: Open Heart Surgery;  Laterality: N/A;  . HERNIA REPAIR Left   . INGUINAL HERNIA REPAIR Right 08/06/2016   Procedure: HERNIA REPAIR INGUINAL ADULT;  Surgeon: Clayburn Pert, MD;  Location: [place];  Service: General;  Laterality: Right;  . LEFT AND RIGHT HEART CATHETERIZATION WITH CORONARY ANGIOGRAM N/A 04/25/2012   Procedure: LEFT AND RIGHT HEART CATHETERIZATION WITH CORONARY ANGIOGRAM;  Surgeon: Leonie Man, MD;  Location: [facility] CATH LAB;  Service: Cardiovascular;  Laterality: N/A;  . LEFT HEART CATHETERIZATION WITH CORONARY/GRAFT  ANGIOGRAM  07/29/2013   Procedure: LEFT HEART CATHETERIZATION WITH Beatrix Fetters;  Surgeon: Blane Ohara, MD;  Location: [facility] CATH LAB;  Service: Cardiovascular; ; 95% stenosis with percent mid occlusion of SVG-RPDA; 90% anastomotic SVG-diagonal, moderate 50% disease in the native circumflex with patent OM 1 OM 2. 80-90% proximal LAD with patent LIMA to LAD , EF 50%  . MOHS SURGERY     FOREHEAD  . NM MYOVIEW LTD  Jan 2015   Moderate Inferior infarct (as expected), but no ischemia & EF ~45-50%  . PERCUTANEOUS CORONARY STENT INTERVENTION (PCI-S)  07/29/2013    Procedure: PERCUTANEOUS CORONARY STENT INTERVENTION (PCI-S);  Surgeon: Blane Ohara, MD;  Location: [facility] CATH LAB;  Service: Cardiovascular;; PCI to SVG-RPDA: Promus DES - 3 mm x 38 mm, 3 mm x 38 mm, 3 mm x 20 mm, 2.75 mm x 38 mm -- all postdilated to ~3.3 mm;  . PERCUTANEOUS CORONARY STENT INTERVENTION (PCI-S) N/A 08/01/2013   Procedure: PERCUTANEOUS CORONARY STENT INTERVENTION (PCI-S);  Surgeon: Leonie Man, MD;  Location: [facility] CATH LAB;  Service: Cardiovascular;staged PCI to distal SVG-diagonal: Xience Xpedition 2.5 mm x 20 mm (2.66 mm)    . TRANSTHORACIC ECHOCARDIOGRAM  12/'13; 07/30/2013   a) Post CABG: EF 50-55%, moderate HK of anteroseptal wall, septal dyskinesis/dyssynergy due to poststernotomy;  grade 1 diastolic dysfunction. Mildly dilated LA, mildly reduced RV function-likely due to septal dyssynergy.; b) 11/'14 Post Inferior STEMI: EF 50-55% mild LVH. Mild hypokinesis of the distal lateral, and basal to mid inferior lateral and inferior walls -- consistent with RCA infarct    There were no vitals filed for this visit.      Subjective Assessment - 10/06/16 1352    Subjective Patient reports doing well; Reports feeling less right low back pain; He is still having right SI joint pain but that has lessened; reports compliance with HEP 50% of time; has had a busy schedule;    Pertinent History pertinent risk factors affecting rehab: Heart disease/HTN (controlled); works 12-15 hour days, has good support system;    How long can you sit comfortably? 15-20 min; has less pain when sitting towards right side with decreased lumbar lordosis;    How long can you stand comfortably? has pain in SI joints when standing; stands for 13 hour days; has pain but able to push through; worse pain with longer standing;    How long can you walk comfortably? R SI Joint pain with walking;    Diagnostic tests X-ray- normal, no abnormality noted;    Patient Stated Goals Get back to normal, reduce pain, return to  daily activities;    Currently in Pain? Yes   Pain Score 2    Pain Location Sacrum   Pain Orientation Right   Pain Descriptors / Indicators Aching;Sore   Pain Type Acute pain   Pain Onset 1 to 4 weeks ago      TREATMENT: Hooklying: Lumbar trunk rotation to left side x2 min;  RLE hip flexion isometric 3 sec hold x10; LLE bridge x10 with RLE into hip flexion to faciliate LLE posterior rotation and RLE anterior rotation of pelvis;  RLE hip flexion march with green tband 2x10 with min Vcs to avoid painful ROM and to increase core stabilization with exercise;   Left sidelying; RLE hip abduction clamshells green tband 2x15 with min VCs for positioning; Right hip flexion with manual anterior rotation of RLE 5 sec hold x5 reps, x2 sets with patient reporting less pain in RLE  following manual innominate rotation;  Patient in sitting: RLE hip flexion green tband x10 reps; RLE hip flexion isometric 3 sec hold x5 reps;    Advanced HEP- see patient instructions; Patient reports no pain following treatment session; Patient reports feeling good with body mechanics. Reports that he has been able to adjust posture at work to accommodate for less discomfort;                          PT Education - 10/06/16 1419    Education provided Yes   Education Details HEP advanced, exercise, posture;    Person(s) Educated Patient   Methods Explanation;Verbal cues;Handout   Comprehension Verbalized understanding;Returned demonstration;Verbal cues required             PT Long Term Goals - 10/02/16 1418      PT LONG TERM GOAL #1   Title Patient will be independent in home exercise program to improve strength/mobility for better functional independence with ADLs.   Time 6   Period Weeks   Status New     PT LONG TERM GOAL #2   Title Patient will report a worst pain of 3/10 on VAS in    low back         to improve tolerance with ADLs and reduced symptoms with activities.     Time 6   Period Weeks   Status New     PT LONG TERM GOAL #3   Title Patient will reduce modified Oswestry score to <20 as to demonstrate minimal disability with ADLs including improved sleeping tolerance, walking/sitting tolerance etc for better mobility with ADLs.    Time 6   Period Weeks   Status New     PT LONG TERM GOAL #4   Title Patient will demonstrate improved lumbar ROM: flexion: >40 degrees, right lateral flexion> 20 degrees without pain as to return to PLOF.    Time 6   Period Weeks   Status New               Plan - 10/06/16 1420    Clinical Impression Statement Patient instructed in advanced RLE hip strengthening to improve pelvic support and reduce pain; He responded well with less RLE SI joint pain at end of session: Patient requires min Vcs for correct positioning and to increase core stabilization with exercise. He would benefit from additional skilled PT intervention to improve strength and reduce pain with ADLs;    Rehab Potential Good   Clinical Impairments Affecting Rehab Potential positive: acute injury, minimal co-morbidities, good social support; negative: works long days (12-15 hours); patient's clinical presentation is stable as his pain is localized to right low back and has been improving;    PT Frequency 2x / week   PT Duration 6 weeks   PT Treatment/Interventions Cryotherapy;Electrical Stimulation;Moist Heat;Traction;Therapeutic exercise;Therapeutic activities;Functional mobility training;Stair training;Gait training;Patient/family education;Manual techniques;Passive range of motion;Energy conservation;Dry needling   PT Next Visit Plan work on hip flexor strengthening RLE, manual therapy,    PT Home Exercise Plan initiated see patient instructions;    Consulted and Agree with Plan of Care Patient      Patient will benefit from skilled therapeutic intervention in order to improve the following deficits and impairments:  Decreased endurance,  Hypomobility, Pain, Decreased activity tolerance, Decreased strength, Increased fascial restricitons, Decreased mobility, Difficulty walking, Decreased range of motion, Improper body mechanics, Postural dysfunction, Impaired flexibility  Visit Diagnosis: Acute right-sided low back pain without sciatica  Muscle weakness (generalized)     Problem List Patient Active Problem List   Diagnosis Date Noted  . Non-recurrent unilateral inguinal hernia without obstruction or gangrene   . Right inguinal hernia 07/02/2016  . Personal history of other malignant neoplasm of skin 10/08/2015  . Essential hypertension 07/19/2014  . Fatigue 01/05/2014  . Heart palpitations 09/10/2013  . Obesity (BMI 30-39.9) 08/09/2013  . STEMI 07/29/13- successful complex full metal jacket stenting of the SVG-PDA/PLA -07/29/2013, staged SVG-Dx PCI 12/01 07/29/2013  . CAD (coronary artery disease), autologous vein bypass graft --> full metal jacket PCI of SVG-RCA; PCI of SVG-D1 anastomosis 07/02/2013  . Hyperlipidemia LDL goal <70   . 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 04/29/2012  . ST elevation myocardial infarction (STEMI) of inferior wall, initial episode of care - s/p PTCA of 95% ISR to 70% and 100% distal stent occlusion to ~40% (8/25) 04/25/2012  . H/O: GI bleed - 2007 on Plavix 04/25/2012  . CAD in native artery - prior Inferior MI - RCA PCI; 04/2012 - Inferior STEMI - RCA occluded (unable to dilate lesion adequately) + LAD & D1 disease --> referred for CABG 04/07/2012    Baldomero Mirarchi PT, DPT 10/06/2016, 2:21 PM  [facility] [address] [place], [place], [address] Phone: [phone]   Fax:  [phone]  Name: LIANGELO MUCKLEROY MRN: [id number] Date of Birth: 01/12/1946

## 2016-10-06 NOTE — Patient Instructions (Addendum)
Flexion: Isometric (Sitting)    Position Helper: Place hands on left thigh. Motion - Cue patient to lift leg. - Helper blocks movement. Hold 3-5___ seconds. Relax. Repeat _10__ times. Repeat with other leg. Do __2_ sessions per day.  Copyright  VHI. All rights reserved.  Flexion: Isometric (Supine)    Position Patient: Both legs bent, lift right leg and push against knee, hold for 3-5 sec, repeat 10 times;   Relax. Repeat __10_ times. Repeat with other leg. Do ___2 sessions per day. Variation: Perform with hip bent to _90__.  Copyright  VHI. All rights reserved.    FLEXION: Sitting - Resistance Band (Active)   Sit, both feet flat. Have band tied around both legs above knees, lift right knee toward ceiling.Repeat with other knee Complete _2__ sets of _10__ repetitions. Perform _2__ sessions per day.  [email or web address]

## 2016-10-08 ENCOUNTER — Ambulatory Visit: Payer: 59

## 2016-10-13 ENCOUNTER — Encounter: Payer: Self-pay | Admitting: Physical Therapy

## 2016-12-08 DIAGNOSIS — H02051 Trichiasis without entropian right upper eyelid: Secondary | ICD-10-CM | POA: Diagnosis not present

## 2017-01-05 ENCOUNTER — Other Ambulatory Visit: Payer: Self-pay | Admitting: Cardiology

## 2017-01-05 NOTE — Telephone Encounter (Signed)
NEEDS APPOINTMENT FOR FURTHER REFILLS.

## 2017-01-12 IMAGING — CT CT THORACIC SPINE WITHOUT CONTRAST
3 of 4 series · 15 of 33 positions shown, 18 images · non-contrast
Comparison: None.

CLINICAL DATA: Patient fell approximately 5 foot off of a ladder on
09/10/2014 and complains of persistent thoracolumbar pain.

EXAM:
CT THORACIC SPINE WITHOUT CONTRAST
TECHNIQUE: Multidetector CT imaging of the thoracic spine was performed without
intravenous contrast administration. Multiplanar CT image
reconstructions were also generated.

[Series 17: thins soft tissue · axial · 0.36mm/px · z∈[-475,-225]mm · 7 of 334 slices shown, 9 images]
[im 42/334  soft-tissue]
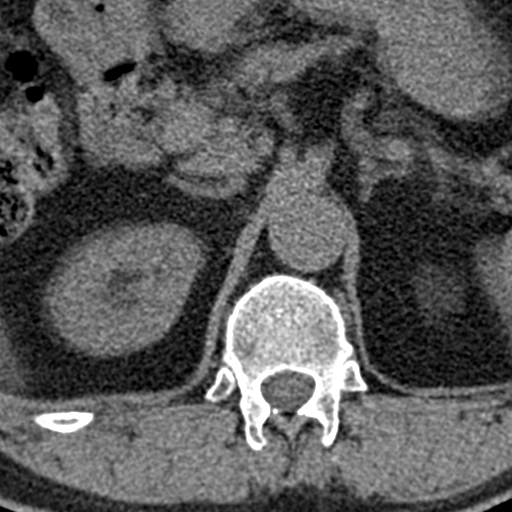
[im 42/334  bone]
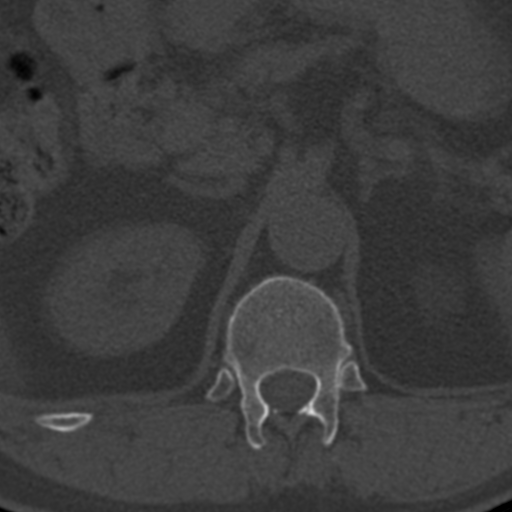
[im 84/334  bone]
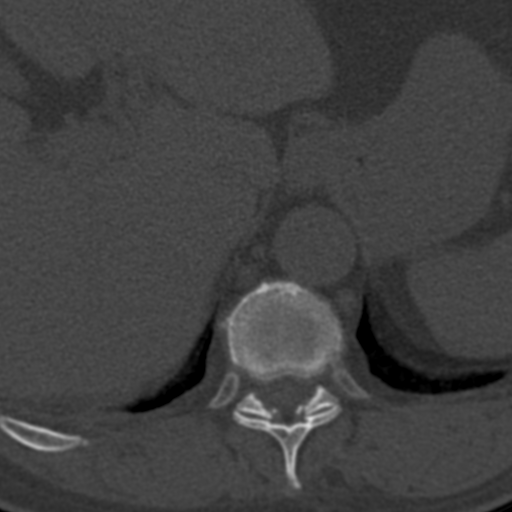
[im 125/334  bone]
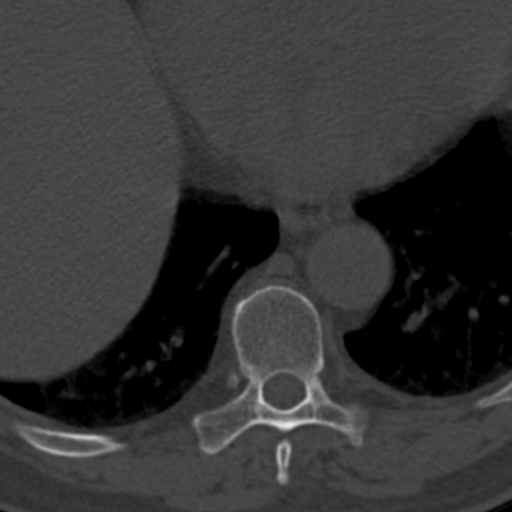
[im 167/334  bone]
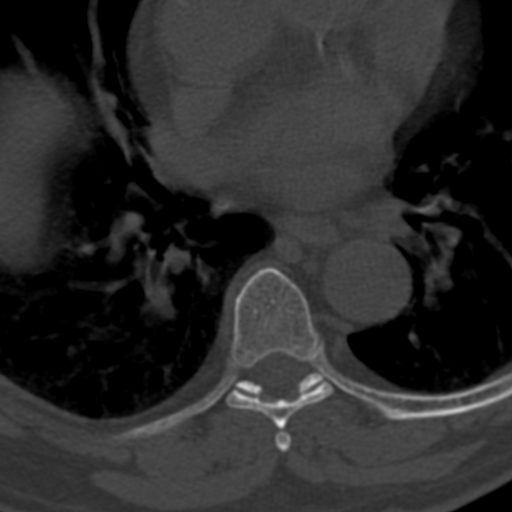
[im 209/334  soft-tissue]
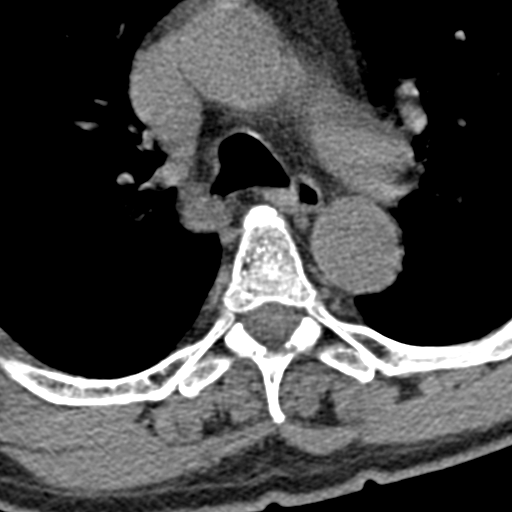
[im 209/334  bone]
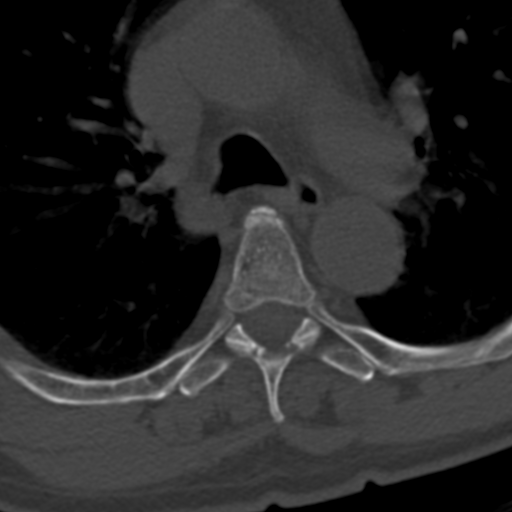
[im 250/334  bone]
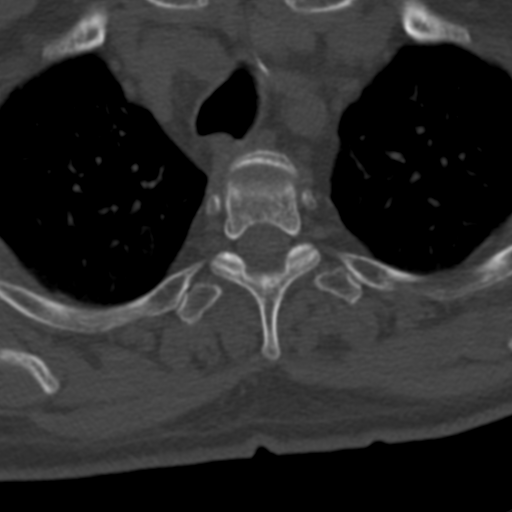
[im 292/334  bone]
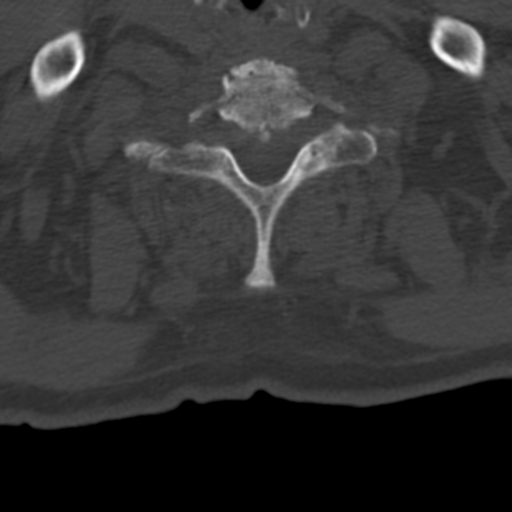

[Series 602: thoracic bone coronal · coronal · 0.58mm/px · 3 of 86 slices shown]
[im 18/86  bone]
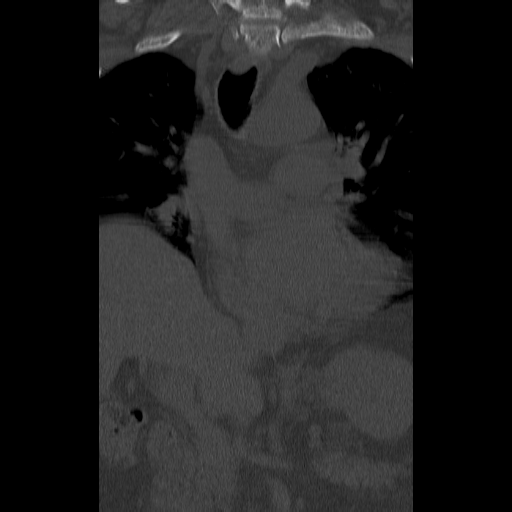
[im 35/86  bone]
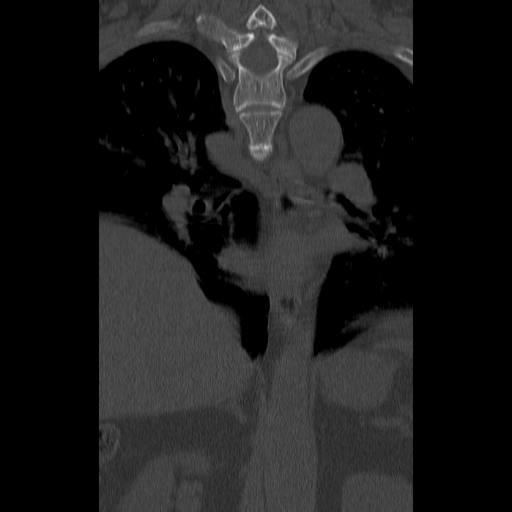
[im 52/86  bone]
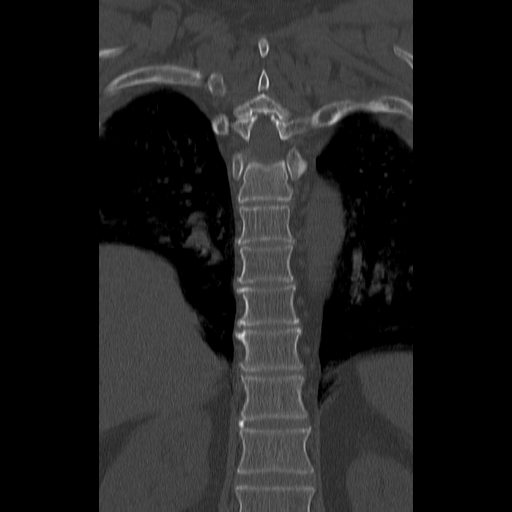

[Series 603: thoracic bone sagittal · sagittal · 0.58mm/px · 5 of 54 slices shown, 6 images]
[im 18/54  bone]
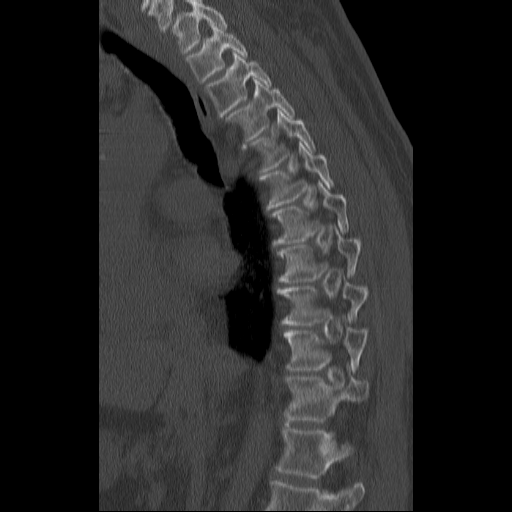
[im 23/54  bone]
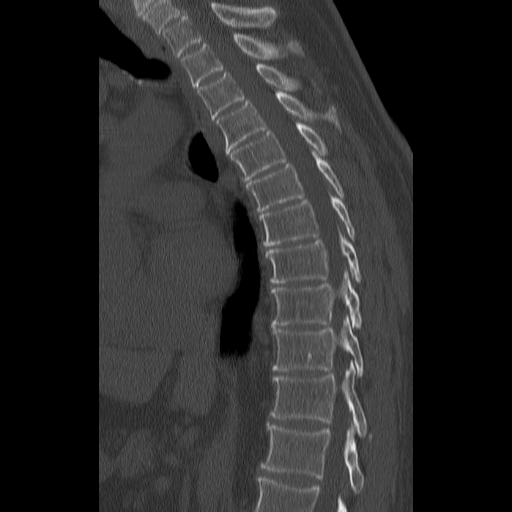
[im 27/54  soft-tissue]
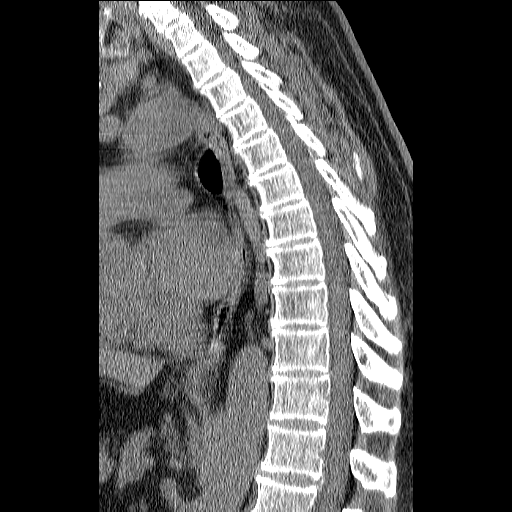
[im 27/54  bone]
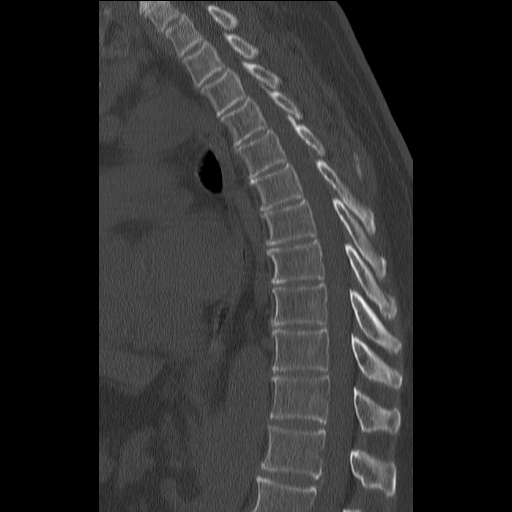
[im 31/54  bone]
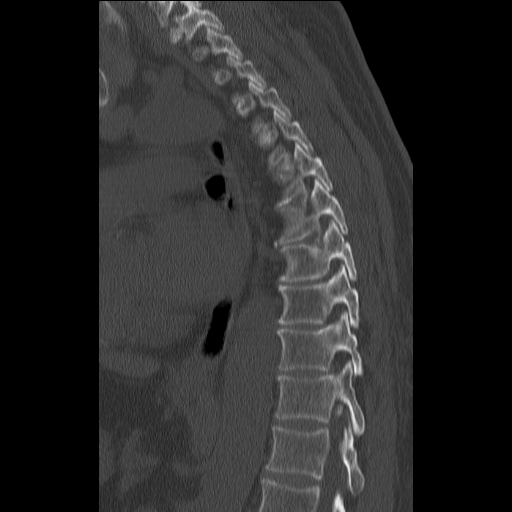
[im 36/54  bone]
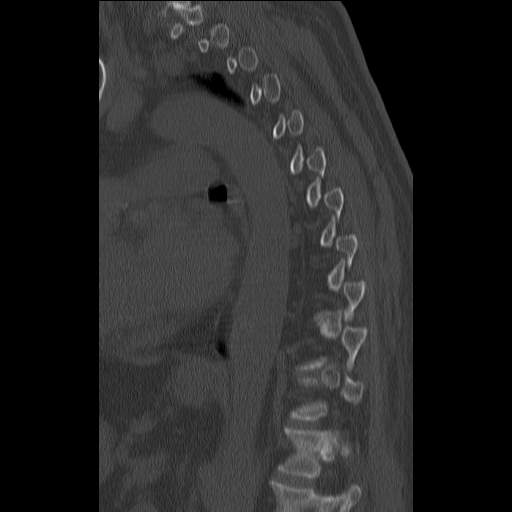

[15 of 33 positions shown; findings below may reference images not displayed]

FINDINGS: Twelve rib-bearing thoracic vertebrae with anatomic alignment. No
fractures involving the thoracic spine. Mild mid and lower thoracic
spondylosis. No evidence of spinal stenosis. Severe degenerative
changes involving the visualized lower cervical spine.

Calcification along the left lateral aspect of the thecal sac at the
T11 level, not associated with disc material and there is no
significant degenerative change in the facets at this level. Neural
foramina appear patent throughout the thoracic spine.

Even though the ribs are incompletely imaged, a mildly displaced
fracture of the right posterior eleventh rib and a nondisplaced
fracture of the right posterior tenth rib are identified.
IMPRESSION: 1. No evidence of thoracic spine fracture.
2. Mildly displaced fracture of the right posterior eleventh rib and
nondisplaced fracture of the right posterior tenth rib.
3. Calcification along the left lateral aspect of the thecal sac at
the T11 level, not associated with disc material. Query very small
benign calcified meningioma. This is of doubtful clinical
significance.

The delay in this report is related to IT issues in that the axial
images of the thoracic spine were not available at the time that the
lumbar spine images were available. The lumbar spine CT is reported
separately.

## 2017-01-27 ENCOUNTER — Other Ambulatory Visit: Payer: Self-pay | Admitting: Cardiology

## 2017-04-01 ENCOUNTER — Other Ambulatory Visit: Payer: Self-pay | Admitting: Cardiovascular Disease

## 2017-04-01 ENCOUNTER — Other Ambulatory Visit: Payer: Self-pay | Admitting: Cardiology

## 2017-04-24 ENCOUNTER — Other Ambulatory Visit: Payer: Self-pay | Admitting: Cardiology

## 2017-05-06 ENCOUNTER — Other Ambulatory Visit: Payer: Self-pay | Admitting: Cardiovascular Disease

## 2017-05-20 DIAGNOSIS — Z85828 Personal history of other malignant neoplasm of skin: Secondary | ICD-10-CM | POA: Diagnosis not present

## 2017-05-20 DIAGNOSIS — L308 Other specified dermatitis: Secondary | ICD-10-CM | POA: Diagnosis not present

## 2017-05-20 DIAGNOSIS — L821 Other seborrheic keratosis: Secondary | ICD-10-CM | POA: Diagnosis not present

## 2017-06-01 ENCOUNTER — Encounter: Payer: Self-pay | Admitting: Cardiovascular Disease

## 2017-06-01 ENCOUNTER — Ambulatory Visit (INDEPENDENT_AMBULATORY_CARE_PROVIDER_SITE_OTHER): Payer: 59 | Admitting: Cardiovascular Disease

## 2017-06-01 VITALS — BP 142/88 | HR 88 | Ht 69.0 in | Wt 222.2 lb

## 2017-06-01 DIAGNOSIS — E782 Mixed hyperlipidemia: Secondary | ICD-10-CM | POA: Diagnosis not present

## 2017-06-01 DIAGNOSIS — I10 Essential (primary) hypertension: Secondary | ICD-10-CM | POA: Diagnosis not present

## 2017-06-01 DIAGNOSIS — I2510 Atherosclerotic heart disease of native coronary artery without angina pectoris: Secondary | ICD-10-CM

## 2017-06-01 MED ORDER — VALSARTAN 320 MG PO TABS: 320.0000 mg | ORAL_TABLET | Freq: Every day | ORAL | 3 refills | Status: DC

## 2017-06-01 MED ORDER — METOPROLOL SUCCINATE ER 50 MG PO TB24: 50.0000 mg | ORAL_TABLET | Freq: Every day | ORAL | 3 refills | Status: DC

## 2017-06-01 MED ORDER — ROSUVASTATIN CALCIUM 40 MG PO TABS: 40.0000 mg | ORAL_TABLET | Freq: Every day | ORAL | 3 refills | Status: DC

## 2017-06-01 NOTE — Progress Notes (Signed)
Cardiology Office Note   Date:  06/01/2017   ID:  Pershing, Skidmore 05/03/1946, MRN [id number]  PCP:  Glean Hess, MD  Cardiologist:   Kathlyn Sacramento, MD   Chief Complaint  Patient presents with  . other    6 month follow up. Pt. had a 24 hour holter monitor and myoview at the [facility]. Meds reviewed by the pt. verbally. "doing well."       History of Present Illness: Jonathan Escobar is a 71 y.o. male who presents for a follow up visit Regarding coronary artery disease.  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  Inferior STEMI 04/25/2012 - 95% ISR followed by 5% stenosis post stent with lesions in the PL and PDA. Also noted 70% mid LAD; after aspiration thrombectomy, balloon angioplasty performed and he was sent for urgent CABG after stabilization on Integrelin (LIMA-LAD, SVG-PDA and PLA, SVG-Diag)   Inferior STEMI in November 2014 with occluded SVG-PDA --> extensive PCI (full metal jacket) of SVG followed by staged PCI of anastomotic SVG-diagonal lesion. No cardiac events since then. He had hernia surgery done in December without complications. He had worsening exertional dyspnea early this year and underwent a treadmill nuclear stress test at the [place] which showed no evidence of ischemia or infarct with normal ejection fraction and fair exercise capacity. He has been doing well overall with no chest pain or shortness of breath. He had a Holter monitor done which showed no significant arrhythmia.   Past Medical History:  Diagnosis Date  . CAD (coronary artery disease), autologous vein bypass graft Nov 2014   Inf STEMI - PCI to SVG-RPDA:   . CAD S/P percutaneous coronary angioplasty 1999   BMS to prox RCA 1999; Brachytherapy ~2003, followed by DES  PCI (Taxus) for ISR.  Marland Kitchen Coronary stent  restenosis due to progression of disease 2000; 2005   (While Still In [place]) Status post Brachytherapy to RCA ISR - 2000; Redo PCI with DES 2005  . Essential hypertension    CONTROLLED ON MEDS  . GERD (gastroesophageal reflux disease)   . History of GI bleed     while on Plavix  . History of viral meningitis    following GI bleed   . History of: ST elevation myocardial infarction (STEMI) of inferior wall, subsequent episode of care April 25, 2012;   RCA: 95% ISR followed by 100% stenosis post-stent with significant RPL lesions; LAD: Tandem 80 and 70% lesions involving D1 (1, 1, 1) --> initial angioplasty to RCA aspiration thrombectomy --> urgent CABG;  . Hyperlipidemia LDL goal <70   . Obesity (BMI 30.0-34.9)   . S/P CABG x 4: LIMA-LAD, seq SVG-PD-PLA; SVG-diag. 04/28/2012  . S/P coronary artery stent placement Nov-Dec 2014   a) Inf STEMIL 11/28/'14 --> extensive (full metal Jacket) of SVG-RPDA Promus DES - 3 mm x 38 mm, 3 mm x 38 mm, 3 mm x 20 mm, 2.75 mm x 38 mm -- all postdilated to ~3.3 mm;; b) 12/1/'14: staged PCI to distal SVG-diagonal: Xience Xpedition 2.5 mm x 20 mm (2.66 mm)  . ST elevation myocardial infarction (STEMI) of inferior wall, subsequent episode of care [facility])  Jul 29, 2013   Diffuse near occlusion of SVG-rPDA, 90% SVG-Diag    Past  Surgical History:  Procedure Laterality Date  . CARDIAC CATHETERIZATION  04/28/12   RCA: 95% ISR followed by 100% stenosis post-stent with significant RPL lesions; LAD: Tandem 80 and 70% lesions involving D1 (1, 1, 1) --> initial angioplasty to RCA aspiration thrombectomy --> urgent CABG;  . CORONARY ARTERY BYPASS GRAFT  04/28/2012   Procedure: CORONARY ARTERY BYPASS GRAFTING (CABG);  Surgeon: Gaye Pollack, MD;  Location: [place];  Service: Open Heart Surgery;  Laterality: N/A;  . HERNIA REPAIR Left   . INGUINAL HERNIA REPAIR Right 08/06/2016   Procedure: HERNIA REPAIR INGUINAL ADULT;  Surgeon: Clayburn Pert, MD;  Location: [place];  Service: General;  Laterality: Right;  . LEFT AND RIGHT HEART CATHETERIZATION WITH CORONARY ANGIOGRAM N/A 04/25/2012   Procedure: LEFT AND RIGHT HEART CATHETERIZATION WITH CORONARY ANGIOGRAM;  Surgeon: Leonie Man, MD;  Location: [facility] CATH LAB;  Service: Cardiovascular;  Laterality: N/A;  . LEFT HEART CATHETERIZATION WITH CORONARY/GRAFT ANGIOGRAM  07/29/2013   Procedure: LEFT HEART CATHETERIZATION WITH Beatrix Fetters;  Surgeon: Blane Ohara, MD;  Location: [facility] CATH LAB;  Service: Cardiovascular; ; 95% stenosis with percent mid occlusion of SVG-RPDA; 90% anastomotic SVG-diagonal, moderate 50% disease in the native circumflex with patent OM 1 OM 2. 80-90% proximal LAD with patent LIMA to LAD , EF 50%  . MOHS SURGERY     FOREHEAD  . NM MYOVIEW LTD  Jan 2015   Moderate Inferior infarct (as expected), but no ischemia & EF ~45-50%  . PERCUTANEOUS CORONARY STENT INTERVENTION (PCI-S)  07/29/2013   Procedure: PERCUTANEOUS CORONARY STENT INTERVENTION (PCI-S);  Surgeon: Blane Ohara, MD;  Location: [facility] CATH LAB;  Service: Cardiovascular;; PCI to SVG-RPDA: Promus DES - 3 mm x 38 mm, 3 mm x 38 mm, 3 mm x 20 mm, 2.75 mm x 38 mm -- all postdilated to ~3.3 mm;  . PERCUTANEOUS CORONARY STENT INTERVENTION (PCI-S) N/A 08/01/2013   Procedure: PERCUTANEOUS CORONARY STENT INTERVENTION (PCI-S);  Surgeon: Leonie Man, MD;  Location: [facility] CATH LAB;  Service: Cardiovascular;staged PCI to distal SVG-diagonal: Xience Xpedition 2.5 mm x 20 mm (2.66 mm)    . TRANSTHORACIC ECHOCARDIOGRAM  12/'13; 07/30/2013   a) Post CABG: EF 50-55%, moderate HK of anteroseptal wall, septal dyskinesis/dyssynergy due to poststernotomy;  grade 1 diastolic dysfunction. Mildly dilated LA, mildly reduced RV function-likely due to septal dyssynergy.; b) 11/'14 Post Inferior STEMI: EF 50-55% mild LVH. Mild hypokinesis of the distal lateral, and basal to mid inferior lateral and inferior walls -- consistent with RCA infarct      Current Outpatient Prescriptions  Medication Sig Dispense Refill  . acetaminophen (TYLENOL) 325 MG tablet Take 2 tablets (650 mg total) by mouth every 4 (four) hours as needed for headache or mild pain.    . Ascorbic Acid (VITAMIN C) 1000 MG tablet Take 1,000 mg by mouth daily.    Marland Kitchen aspirin 81 MG tablet Take 81 mg by mouth daily.    Marland Kitchen atorvastatin (LIPITOR) 20 MG tablet TAKE 1 TABLET BY MOUTH DAILY AT 6 PM. 90 tablet 0  . cetirizine (ZYRTEC) 10 MG tablet Take 10 mg by mouth daily. PM    . clopidogrel (PLAVIX) 75 MG tablet Take 1 tablet (75 mg total) by mouth daily with breakfast. 90 tablet 3  . metoprolol succinate (TOPROL-XL) 50 MG 24 hr tablet TAKE 1 TABLET BY MOUTH DAILY. 30 tablet 0  . NITROSTAT 0.4 MG SL tablet PLACE 1 TABLET UNDER THE TONGUE EVERY 5 MINUTES X 3  DOSES AS NEEDED FOR CHEST PAIN. 25 tablet 3  . pantoprazole (PROTONIX) 40 MG tablet Take 1 tablet (40 mg total) by mouth daily at 6 (six) AM. (Patient taking differently: Take 40 mg by mouth daily at 6 (six) AM. PM) 90 tablet 3  . traZODone (DESYREL) 50 MG tablet Take 50 mg by mouth at bedtime.    . valsartan (DIOVAN) 320 MG tablet Take 1 tablet (320 mg total) by mouth daily. (Patient taking differently: Take 320 mg by mouth daily. PM) 90 tablet 3   No current facility-administered medications for this visit.     Allergies:   Norvasc [amlodipine besylate]; Ace inhibitors; and Amoxicillin    Social History:  The patient  reports that he quit smoking about 40 years ago. His smoking use included Cigarettes. He has a 10.00 pack-year smoking history. He has never used smokeless tobacco. He reports that he drinks about 1.2 oz of alcohol per week . He reports that he does not use drugs.   Family History:  The patient's family history includes Alzheimer's disease in his mother; Coronary artery disease in his father; Heart failure in his father; Valvular heart disease in his sister.    ROS:  Please see the history of present  illness.   Otherwise, review of systems are positive for none.   All other systems are reviewed and negative.    PHYSICAL EXAM: VS:  BP (!) 142/88 (BP Location: Left Arm, Patient Position: Sitting, Cuff Size: Normal)   Pulse 88   Ht 5\' 9"  (1.753 m)   Wt 222 lb 4 oz (100.8 kg)   BMI 32.82 kg/m  , BMI Body mass index is 32.82 kg/m. GEN: Well nourished, well developed, in no acute distress  HEENT: normal  Neck: no JVD,  or masses. Faint bilateral carotid bruits Cardiac: RRR; no murmurs, rubs, or gallops,no edema  Respiratory:  clear to auscultation bilaterally, normal work of breathing GI: soft, nontender, nondistended, + BS MS: no deformity or atrophy  Skin: warm and dry, no rash Neuro:  Strength and sensation are intact Psych: euthymic mood, full affect   EKG:  EKG is ordered today. The ekg ordered today demonstrates normal sinus rhythm with nonspecific ST changes. Left axis deviation.   Recent Labs: No results found for requested labs within last 8760 hours.    Lipid Panel    Component Value Date/Time   CHOL 141 12/12/2015 0827   CHOL 153 09/07/2012 1201   TRIG 94 12/12/2015 0827   TRIG 185 09/07/2012 1201   HDL 93 12/12/2015 0827   HDL 31 (L) 09/07/2012 1201   CHOLHDL 1.5 12/12/2015 0827   CHOLHDL 3.2 07/30/2013 0403   VLDL 14 07/30/2013 0403   VLDL 37 09/07/2012 1201   LDLCALC 29 12/12/2015 0827   LDLCALC 85 09/07/2012 1201      Wt Readings from Last 3 Encounters:  06/01/17 222 lb 4 oz (100.8 kg)  08/14/16 212 lb 9.6 oz (96.4 kg)  08/06/16 210 lb (95.3 kg)      No flowsheet data found.    ASSESSMENT AND PLAN:  1.  Coronary artery disease involving bypass graft without angina: He is doing well overall with no anginal symptoms. Continue medical therapy. Given his extensive cardiac stenting including vein grafts, I recommend lifelong dual antiplatelet therapy.   2. Hyperlipidemia:  I reviewed his most recent lipid profile which showed a total cholesterol  of 136, triglyceride of 156, LDL of 81, and HDL of 46. This was done  in July at the [place]. Given his extensive coronary artery disease, we should try to get his LDL below 70. Thus, I elected to switch atorvastatin to rosuvastatin 40 mg once daily. Check fasting lipid and liver profile in 6 weeks.  3. Essential hypertension: Blood pressure is mildly elevated. We can consider increasing metoprolol or switching to carvedilol.   Disposition:   FU with me in 6 months  Signed,  Kathlyn Sacramento, MD  06/01/2017 3:02 PM    [facility]

## 2017-06-01 NOTE — Patient Instructions (Signed)
Medication Instructions:  Your physician has recommended you make the following change in your medication:  STOP taking atorvastatin START taking rosuvastatin 40mg  once daily    Labwork: Fasting lipid and liver profile in 6 weeks at the [place] lab  Testing/Procedures: none  Follow-Up: Your physician wants you to follow-up in: 6 months with Dr. Fletcher Anon.  You will receive a reminder letter in the mail two months in advance. If you don't receive a letter, please call our office to schedule the follow-up appointment.   Any Other Special Instructions Will Be Listed Below (If Applicable).     If you need a refill on your cardiac medications before your next appointment, please call your pharmacy.

## 2017-06-04 NOTE — Addendum Note (Signed)
Addended by: Anselm Pancoast on: 06/04/2017 03:48 PM   Modules accepted: Orders

## 2017-07-01 DIAGNOSIS — H524 Presbyopia: Secondary | ICD-10-CM | POA: Diagnosis not present

## 2017-07-16 ENCOUNTER — Encounter: Payer: Self-pay | Admitting: Cardiovascular Disease

## 2017-07-20 ENCOUNTER — Other Ambulatory Visit: Payer: Self-pay

## 2017-07-20 MED ORDER — ATORVASTATIN CALCIUM 20 MG PO TABS: 20.0000 mg | ORAL_TABLET | Freq: Every day | ORAL | 3 refills | Status: DC

## 2017-07-20 MED ORDER — EZETIMIBE 10 MG PO TABS: 10.0000 mg | ORAL_TABLET | Freq: Every day | ORAL | 3 refills | Status: DC

## 2017-09-10 DIAGNOSIS — M65312 Trigger thumb, left thumb: Secondary | ICD-10-CM | POA: Diagnosis not present

## 2017-10-23 ENCOUNTER — Other Ambulatory Visit: Payer: 59

## 2017-10-23 ENCOUNTER — Other Ambulatory Visit (INDEPENDENT_AMBULATORY_CARE_PROVIDER_SITE_OTHER): Payer: 59

## 2017-10-23 ENCOUNTER — Other Ambulatory Visit: Payer: Self-pay

## 2017-10-23 DIAGNOSIS — E785 Hyperlipidemia, unspecified: Secondary | ICD-10-CM | POA: Diagnosis not present

## 2017-10-23 DIAGNOSIS — I2510 Atherosclerotic heart disease of native coronary artery without angina pectoris: Secondary | ICD-10-CM

## 2017-10-24 LAB — HEPATIC FUNCTION PANEL
ALBUMIN: 4 g/dL (ref 3.5–4.8)
ALT: 50 IU/L — AB (ref 0–44)
AST: 72 IU/L — AB (ref 0–40)
Alkaline Phosphatase: 71 IU/L (ref 39–117)
BILIRUBIN, DIRECT: 0.18 mg/dL (ref 0.00–0.40)
Bilirubin Total: 0.6 mg/dL (ref 0.0–1.2)
Total Protein: 6.8 g/dL (ref 6.0–8.5)

## 2017-10-24 LAB — LIPID PANEL
CHOLESTEROL TOTAL: 119 mg/dL (ref 100–199)
Chol/HDL Ratio: 2.9 ratio (ref 0.0–5.0)
HDL: 41 mg/dL (ref >39)
LDL CALC: 58 mg/dL (ref 0–99)
TRIGLYCERIDES: 100 mg/dL (ref 0–149)
VLDL Cholesterol Cal: 20 mg/dL (ref 5–40)

## 2017-11-03 ENCOUNTER — Other Ambulatory Visit: Payer: Self-pay

## 2017-11-03 DIAGNOSIS — R748 Abnormal levels of other serum enzymes: Secondary | ICD-10-CM

## 2017-11-03 MED ORDER — ATORVASTATIN CALCIUM 10 MG PO TABS: 10.0000 mg | ORAL_TABLET | Freq: Every day | ORAL | 3 refills | Status: DC

## 2017-11-04 DIAGNOSIS — I10 Essential (primary) hypertension: Secondary | ICD-10-CM | POA: Diagnosis not present

## 2017-11-04 DIAGNOSIS — Z125 Encounter for screening for malignant neoplasm of prostate: Secondary | ICD-10-CM | POA: Diagnosis not present

## 2017-11-04 DIAGNOSIS — E785 Hyperlipidemia, unspecified: Secondary | ICD-10-CM | POA: Diagnosis not present

## 2017-12-11 ENCOUNTER — Ambulatory Visit: Payer: 59 | Admitting: Cardiovascular Disease

## 2017-12-11 VITALS — BP 116/84 | HR 58 | Ht 69.0 in | Wt 227.2 lb

## 2017-12-11 DIAGNOSIS — I10 Essential (primary) hypertension: Secondary | ICD-10-CM

## 2017-12-11 DIAGNOSIS — I2510 Atherosclerotic heart disease of native coronary artery without angina pectoris: Secondary | ICD-10-CM

## 2017-12-11 DIAGNOSIS — E7800 Pure hypercholesterolemia, unspecified: Secondary | ICD-10-CM | POA: Diagnosis not present

## 2017-12-11 NOTE — Patient Instructions (Signed)
Medication Instructions:  Your physician recommends that you continue on your current medications as directed. Please refer to the Current Medication list given to you today.   Labwork: Lipid and liver profile today   Testing/Procedures: none  Follow-Up: Your physician wants you to follow-up in: 6 months with Dr. Fletcher Anon.  You will receive a reminder letter in the mail two months in advance. If you don't receive a letter, please call our office to schedule the follow-up appointment.   Any Other Special Instructions Will Be Listed Below (If Applicable).     If you need a refill on your cardiac medications before your next appointment, please call your pharmacy.

## 2017-12-11 NOTE — Progress Notes (Signed)
Cardiology Office Note   Date:  12/11/2017   ID:  Patterson, Hollenbaugh [birth year], MRN [id number]  PCP:  Glean Hess, MD  Cardiologist:   Kathlyn Sacramento, MD   Chief Complaint  Patient presents with  . other    6 month f/u no complaints today. Meds reviewed verbally with pt.      History of Present Illness: Jonathan Escobar is a 72 y.o. male who presents for a follow up visit Regarding coronary artery disease.  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  Inferior STEMI 04/25/2012 - 95% ISR followed by 5% stenosis post stent with lesions in the PL and PDA. Also noted 70% mid LAD; after aspiration thrombectomy, balloon angioplasty performed and he was sent for urgent CABG after stabilization on Integrelin (LIMA-LAD, SVG-PDA and PLA, SVG-Diag)   Inferior STEMI in November 2014 with occluded SVG-PDA --> extensive PCI (full metal jacket) of SVG followed by staged PCI of anastomotic SVG-diagonal lesion. No cardiac events since then.   He had a treadmill nuclear stress test done at the [place] in 2017 which showed no evidence of ischemia with normal ejection fraction.  He has been doing well with no recent chest pain, shortness of breath or palpitations. He did have mildly elevated liver enzymes recently and I decreased the dose of atorvastatin to 10 mg daily and added Zetia 10 mg daily.  He reports no side effects.  Blood pressure has been well controlled.  He drinks 3 ounces of bourbon daily.  Past Medical History:  Diagnosis Date  . CAD (coronary artery disease), autologous vein bypass graft Nov 2014   Inf STEMI - PCI to SVG-RPDA:   . CAD S/P percutaneous coronary angioplasty 1999   BMS to prox RCA 1999; Brachytherapy ~2003, followed by DES  PCI (Taxus) for ISR.  Marland Kitchen Coronary stent restenosis due to  progression of disease 2000; 2005   (While Still In [place]) Status post Brachytherapy to RCA ISR - 2000; Redo PCI with DES 2005  . Essential hypertension    CONTROLLED ON MEDS  . GERD (gastroesophageal reflux disease)   . History of GI bleed     while on Plavix  . History of viral meningitis    following GI bleed   . History of: ST elevation myocardial infarction (STEMI) of inferior wall, subsequent episode of care April 25, 2012;   RCA: 95% ISR followed by 100% stenosis post-stent with significant RPL lesions; LAD: Tandem 80 and 70% lesions involving D1 (1, 1, 1) --> initial angioplasty to RCA aspiration thrombectomy --> urgent CABG;  . Hyperlipidemia LDL goal <70   . Obesity (BMI 30.0-34.9)   . S/P CABG x 4: LIMA-LAD, seq SVG-PD-PLA; SVG-diag. 04/28/2012  . S/P coronary artery stent placement Nov-Dec 2014   a) Inf STEMIL 11/28/'14 --> extensive (full metal Jacket) of SVG-RPDA Promus DES - 3 mm x 38 mm, 3 mm x 38 mm, 3 mm x 20 mm, 2.75 mm x 38 mm -- all postdilated to ~3.3 mm;; b) 12/1/'14: staged PCI to distal SVG-diagonal: Xience Xpedition 2.5 mm x 20 mm (2.66 mm)  . ST elevation myocardial infarction (STEMI) of inferior wall, subsequent episode of care [facility])  Jul 29, 2013   Diffuse near occlusion of SVG-rPDA, 90% SVG-Diag  Past Surgical History:  Procedure Laterality Date  . CARDIAC CATHETERIZATION  04/28/12   RCA: 95% ISR followed by 100% stenosis post-stent with significant RPL lesions; LAD: Tandem 80 and 70% lesions involving D1 (1, 1, 1) --> initial angioplasty to RCA aspiration thrombectomy --> urgent CABG;  . CORONARY ARTERY BYPASS GRAFT  04/28/2012   Procedure: CORONARY ARTERY BYPASS GRAFTING (CABG);  Surgeon: Gaye Pollack, MD;  Location: [place];  Service: Open Heart Surgery;  Laterality: N/A;  . HERNIA REPAIR Left   . INGUINAL HERNIA REPAIR Right 08/06/2016   Procedure: HERNIA REPAIR INGUINAL ADULT;  Surgeon: Clayburn Pert, MD;  Location: [place];  Service:  General;  Laterality: Right;  . LEFT AND RIGHT HEART CATHETERIZATION WITH CORONARY ANGIOGRAM N/A 04/25/2012   Procedure: LEFT AND RIGHT HEART CATHETERIZATION WITH CORONARY ANGIOGRAM;  Surgeon: Leonie Man, MD;  Location: [facility] CATH LAB;  Service: Cardiovascular;  Laterality: N/A;  . LEFT HEART CATHETERIZATION WITH CORONARY/GRAFT ANGIOGRAM  07/29/2013   Procedure: LEFT HEART CATHETERIZATION WITH Beatrix Fetters;  Surgeon: Blane Ohara, MD;  Location: [facility] CATH LAB;  Service: Cardiovascular; ; 95% stenosis with percent mid occlusion of SVG-RPDA; 90% anastomotic SVG-diagonal, moderate 50% disease in the native circumflex with patent OM 1 OM 2. 80-90% proximal LAD with patent LIMA to LAD , EF 50%  . MOHS SURGERY     FOREHEAD  . NM MYOVIEW LTD  Jan 2015   Moderate Inferior infarct (as expected), but no ischemia & EF ~45-50%  . PERCUTANEOUS CORONARY STENT INTERVENTION (PCI-S)  07/29/2013   Procedure: PERCUTANEOUS CORONARY STENT INTERVENTION (PCI-S);  Surgeon: Blane Ohara, MD;  Location: [facility] CATH LAB;  Service: Cardiovascular;; PCI to SVG-RPDA: Promus DES - 3 mm x 38 mm, 3 mm x 38 mm, 3 mm x 20 mm, 2.75 mm x 38 mm -- all postdilated to ~3.3 mm;  . PERCUTANEOUS CORONARY STENT INTERVENTION (PCI-S) N/A 08/01/2013   Procedure: PERCUTANEOUS CORONARY STENT INTERVENTION (PCI-S);  Surgeon: Leonie Man, MD;  Location: [facility] CATH LAB;  Service: Cardiovascular;staged PCI to distal SVG-diagonal: Xience Xpedition 2.5 mm x 20 mm (2.66 mm)    . TRANSTHORACIC ECHOCARDIOGRAM  12/'13; 07/30/2013   a) Post CABG: EF 50-55%, moderate HK of anteroseptal wall, septal dyskinesis/dyssynergy due to poststernotomy;  grade 1 diastolic dysfunction. Mildly dilated LA, mildly reduced RV function-likely due to septal dyssynergy.; b) 11/'14 Post Inferior STEMI: EF 50-55% mild LVH. Mild hypokinesis of the distal lateral, and basal to mid inferior lateral and inferior walls -- consistent with RCA infarct     Current  Outpatient Medications  Medication Sig Dispense Refill  . acetaminophen (TYLENOL) 325 MG tablet Take 2 tablets (650 mg total) by mouth every 4 (four) hours as needed for headache or mild pain.    . Ascorbic Acid (VITAMIN C) 1000 MG tablet Take 1,000 mg by mouth daily.    Marland Kitchen aspirin 81 MG tablet Take 81 mg by mouth daily.    Marland Kitchen atorvastatin (LIPITOR) 10 MG tablet Take 1 tablet (10 mg total) by mouth daily. 90 tablet 3  . cetirizine (ZYRTEC) 10 MG tablet Take 10 mg by mouth daily. PM    . clopidogrel (PLAVIX) 75 MG tablet Take 1 tablet (75 mg total) by mouth daily with breakfast. 90 tablet 3  . ezetimibe (ZETIA) 10 MG tablet Take 1 tablet (10 mg total) daily by mouth. 90 tablet 3  . metoprolol succinate (TOPROL-XL) 50 MG 24 hr tablet Take 1 tablet (50 mg total) by  mouth daily. Take with or immediately following a meal. 90 tablet 3  . NITROSTAT 0.4 MG SL tablet PLACE 1 TABLET UNDER THE TONGUE EVERY 5 MINUTES X 3 DOSES AS NEEDED FOR CHEST PAIN. 25 tablet 3  . pantoprazole (PROTONIX) 40 MG tablet Take 1 tablet (40 mg total) by mouth daily at 6 (six) AM. (Patient taking differently: Take 40 mg by mouth daily at 6 (six) AM. PM) 90 tablet 3  . traZODone (DESYREL) 50 MG tablet Take 50 mg by mouth at bedtime.    . valsartan (DIOVAN) 320 MG tablet Take 1 tablet (320 mg total) by mouth daily. PM 90 tablet 3   No current facility-administered medications for this visit.     Allergies:   Norvasc [amlodipine besylate]; Ace inhibitors; and Amoxicillin    Social History:  The patient  reports that he quit smoking about 41 years ago. His smoking use included cigarettes. He has a 10.00 pack-year smoking history. He has never used smokeless tobacco. He reports that he drinks about 1.2 oz of alcohol per week. He reports that he does not use drugs.   Family History:  The patient's family history includes Alzheimer's disease in his mother; Coronary artery disease in his father; Heart failure in his father; Valvular  heart disease in his sister.    ROS:  Please see the history of present illness.   Otherwise, review of systems are positive for none.   All other systems are reviewed and negative.    PHYSICAL EXAM: VS:  BP 116/84 (BP Location: Left Arm, Patient Position: Sitting, Cuff Size: Normal)   Pulse (!) 58   Ht 5\' 9"  (1.753 m)   Wt 227 lb 4 oz (103.1 kg)   BMI 33.56 kg/m  , BMI Body mass index is 33.56 kg/m. GEN: Well nourished, well developed, in no acute distress  HEENT: normal  Neck: no JVD,  or masses. Faint bilateral carotid bruits Cardiac: RRR; no murmurs, rubs, or gallops,no edema  Respiratory:  clear to auscultation bilaterally, normal work of breathing GI: soft, nontender, nondistended, + BS MS: no deformity or atrophy  Skin: warm and dry, no rash Neuro:  Strength and sensation are intact Psych: euthymic mood, full affect   EKG:  EKG is ordered today. The ekg ordered today demonstrates sinus bradycardia with left axis deviation and no significant ST or T wave changes.    Recent Labs: 10/23/2017: ALT 50    Lipid Panel    Component Value Date/Time   CHOL 119 10/23/2017 0842   CHOL 153 09/07/2012 1201   TRIG 100 10/23/2017 0842   TRIG 185 09/07/2012 1201   HDL 41 10/23/2017 0842   HDL 31 (L) 09/07/2012 1201   CHOLHDL 2.9 10/23/2017 0842   CHOLHDL 3.2 07/30/2013 0403   VLDL 14 07/30/2013 0403   VLDL 37 09/07/2012 1201   LDLCALC 58 10/23/2017 0842   LDLCALC 85 09/07/2012 1201      Wt Readings from Last 3 Encounters:  12/11/17 227 lb 4 oz (103.1 kg)  06/01/17 222 lb 4 oz (100.8 kg)  08/14/16 212 lb 9.6 oz (96.4 kg)      No flowsheet data found.    ASSESSMENT AND PLAN:  1.  Coronary artery disease involving bypass graft without angina: He is doing well overall with no anginal symptoms. Continue medical therapy. Given his extensive cardiac stenting including vein grafts, I recommend lifelong dual antiplatelet therapy.   2. Hyperlipidemia: He did not tolerate  high-dose rosuvastatin due to  myalgia.  He did have recent mild elevation of liver enzymes.  The dose of atorvastatin was decreased to 10 mg daily and Zetia was added.  I am going to repeat lipid and liver profile today.  3. Essential hypertension: Blood pressure is well controlled on current medications.   Disposition:   FU with me in 6 months  Signed,  Kathlyn Sacramento, MD  12/11/2017 10:35 AM    [place]

## 2017-12-12 LAB — HEPATIC FUNCTION PANEL
ALBUMIN: 4 g/dL (ref 3.5–4.8)
ALT: 27 IU/L (ref 0–44)
AST: 25 IU/L (ref 0–40)
Alkaline Phosphatase: 72 IU/L (ref 39–117)
Bilirubin Total: 0.5 mg/dL (ref 0.0–1.2)
Bilirubin, Direct: 0.15 mg/dL (ref 0.00–0.40)
TOTAL PROTEIN: 6.7 g/dL (ref 6.0–8.5)

## 2017-12-12 LAB — LIPID PANEL
CHOL/HDL RATIO: 3.2 ratio (ref 0.0–5.0)
Cholesterol, Total: 126 mg/dL (ref 100–199)
HDL: 39 mg/dL — ABNORMAL LOW (ref >39)
LDL Calculated: 60 mg/dL (ref 0–99)
Triglycerides: 137 mg/dL (ref 0–149)
VLDL CHOLESTEROL CAL: 27 mg/dL (ref 5–40)

## 2017-12-21 NOTE — Addendum Note (Signed)
Addended by: Britt Bottom on: 12/21/2017 09:02 AM   Modules accepted: Orders

## 2018-01-11 DIAGNOSIS — L308 Other specified dermatitis: Secondary | ICD-10-CM | POA: Diagnosis not present

## 2018-01-11 DIAGNOSIS — L905 Scar conditions and fibrosis of skin: Secondary | ICD-10-CM | POA: Diagnosis not present

## 2018-01-11 DIAGNOSIS — L821 Other seborrheic keratosis: Secondary | ICD-10-CM | POA: Diagnosis not present

## 2018-01-11 DIAGNOSIS — L570 Actinic keratosis: Secondary | ICD-10-CM | POA: Diagnosis not present

## 2018-01-13 DIAGNOSIS — M65312 Trigger thumb, left thumb: Secondary | ICD-10-CM | POA: Diagnosis not present

## 2018-01-13 DIAGNOSIS — M65331 Trigger finger, right middle finger: Secondary | ICD-10-CM | POA: Diagnosis not present

## 2018-01-19 ENCOUNTER — Other Ambulatory Visit: Payer: Self-pay

## 2018-01-19 ENCOUNTER — Ambulatory Visit (INDEPENDENT_AMBULATORY_CARE_PROVIDER_SITE_OTHER)
Admission: EM | Admit: 2018-01-19 | Discharge: 2018-01-19 | Disposition: A | Discharge status: Home / Self Care | Payer: 59 | Source: Home / Self Care | Attending: Family Medicine | Admitting: Family Medicine

## 2018-01-19 ENCOUNTER — Encounter: Payer: Self-pay | Admitting: Emergency Medicine

## 2018-01-19 ENCOUNTER — Ambulatory Visit (INDEPENDENT_AMBULATORY_CARE_PROVIDER_SITE_OTHER): Admit: 2018-01-19 | Discharge: 2018-01-19 | Disposition: A | Discharge status: Home / Self Care | Payer: 59

## 2018-01-19 ENCOUNTER — Other Ambulatory Visit
Admission: RE | Admit: 2018-01-19 | Discharge: 2018-01-19 | Disposition: A | Discharge status: Home / Self Care | Payer: 59 | Source: Ambulatory Visit | Attending: Family Medicine | Admitting: Family Medicine

## 2018-01-19 ENCOUNTER — Observation Stay
Admission: EM | Admit: 2018-01-19 | Discharge: 2018-01-21 | Disposition: A | Discharge status: Home / Self Care | Payer: 59 | Source: Other Acute Inpatient Hospital | Attending: Internal Medicine | Admitting: Internal Medicine

## 2018-01-19 DIAGNOSIS — Z85828 Personal history of other malignant neoplasm of skin: Secondary | ICD-10-CM | POA: Diagnosis not present

## 2018-01-19 DIAGNOSIS — Z820 Family history of epilepsy and other diseases of the nervous system: Secondary | ICD-10-CM | POA: Diagnosis not present

## 2018-01-19 DIAGNOSIS — I2510 Atherosclerotic heart disease of native coronary artery without angina pectoris: Secondary | ICD-10-CM | POA: Diagnosis not present

## 2018-01-19 DIAGNOSIS — Z880 Allergy status to penicillin: Secondary | ICD-10-CM | POA: Insufficient documentation

## 2018-01-19 DIAGNOSIS — Z951 Presence of aortocoronary bypass graft: Secondary | ICD-10-CM | POA: Diagnosis not present

## 2018-01-19 DIAGNOSIS — R109 Unspecified abdominal pain: Secondary | ICD-10-CM

## 2018-01-19 DIAGNOSIS — E785 Hyperlipidemia, unspecified: Secondary | ICD-10-CM | POA: Diagnosis present

## 2018-01-19 DIAGNOSIS — K5792 Diverticulitis of intestine, part unspecified, without perforation or abscess without bleeding: Secondary | ICD-10-CM

## 2018-01-19 DIAGNOSIS — E876 Hypokalemia: Secondary | ICD-10-CM | POA: Insufficient documentation

## 2018-01-19 DIAGNOSIS — E669 Obesity, unspecified: Secondary | ICD-10-CM | POA: Insufficient documentation

## 2018-01-19 DIAGNOSIS — K4020 Bilateral inguinal hernia, without obstruction or gangrene, not specified as recurrent: Secondary | ICD-10-CM | POA: Insufficient documentation

## 2018-01-19 DIAGNOSIS — I700 Atherosclerosis of aorta: Secondary | ICD-10-CM | POA: Insufficient documentation

## 2018-01-19 DIAGNOSIS — E871 Hypo-osmolality and hyponatremia: Secondary | ICD-10-CM | POA: Diagnosis not present

## 2018-01-19 DIAGNOSIS — Z955 Presence of coronary angioplasty implant and graft: Secondary | ICD-10-CM | POA: Diagnosis not present

## 2018-01-19 DIAGNOSIS — I10 Essential (primary) hypertension: Secondary | ICD-10-CM | POA: Diagnosis present

## 2018-01-19 DIAGNOSIS — Z8249 Family history of ischemic heart disease and other diseases of the circulatory system: Secondary | ICD-10-CM | POA: Diagnosis not present

## 2018-01-19 DIAGNOSIS — I25810 Atherosclerosis of coronary artery bypass graft(s) without angina pectoris: Secondary | ICD-10-CM | POA: Diagnosis present

## 2018-01-19 DIAGNOSIS — R002 Palpitations: Secondary | ICD-10-CM | POA: Insufficient documentation

## 2018-01-19 DIAGNOSIS — R1032 Left lower quadrant pain: Secondary | ICD-10-CM | POA: Diagnosis not present

## 2018-01-19 DIAGNOSIS — Z87891 Personal history of nicotine dependence: Secondary | ICD-10-CM | POA: Diagnosis not present

## 2018-01-19 DIAGNOSIS — Z6832 Body mass index (BMI) 32.0-32.9, adult: Secondary | ICD-10-CM | POA: Diagnosis not present

## 2018-01-19 DIAGNOSIS — I252 Old myocardial infarction: Secondary | ICD-10-CM | POA: Diagnosis not present

## 2018-01-19 DIAGNOSIS — K5730 Diverticulosis of large intestine without perforation or abscess without bleeding: Secondary | ICD-10-CM

## 2018-01-19 DIAGNOSIS — K5732 Diverticulitis of large intestine without perforation or abscess without bleeding: Principal | ICD-10-CM | POA: Insufficient documentation

## 2018-01-19 DIAGNOSIS — Z8661 Personal history of infections of the central nervous system: Secondary | ICD-10-CM | POA: Insufficient documentation

## 2018-01-19 DIAGNOSIS — D72829 Elevated white blood cell count, unspecified: Secondary | ICD-10-CM | POA: Diagnosis not present

## 2018-01-19 DIAGNOSIS — K429 Umbilical hernia without obstruction or gangrene: Secondary | ICD-10-CM | POA: Insufficient documentation

## 2018-01-19 DIAGNOSIS — Z7982 Long term (current) use of aspirin: Secondary | ICD-10-CM | POA: Insufficient documentation

## 2018-01-19 DIAGNOSIS — K219 Gastro-esophageal reflux disease without esophagitis: Secondary | ICD-10-CM | POA: Diagnosis not present

## 2018-01-19 DIAGNOSIS — Z888 Allergy status to other drugs, medicaments and biological substances status: Secondary | ICD-10-CM | POA: Insufficient documentation

## 2018-01-19 DIAGNOSIS — Z79899 Other long term (current) drug therapy: Secondary | ICD-10-CM | POA: Insufficient documentation

## 2018-01-19 LAB — CBC WITH DIFFERENTIAL/PLATELET
Basophils Absolute: 0 K/uL (ref 0–0.1)
Basophils Relative: 0 %
Eosinophils Absolute: 0 K/uL (ref 0–0.7)
Eosinophils Relative: 0 %
HCT: 40.5 % (ref 40.0–52.0)
Hemoglobin: 13.8 g/dL (ref 13.0–18.0)
Lymphocytes Relative: 7 %
Lymphs Abs: 0.9 K/uL — ABNORMAL LOW (ref 1.0–3.6)
MCH: 30.6 pg (ref 26.0–34.0)
MCHC: 34.1 g/dL (ref 32.0–36.0)
MCV: 90 fL (ref 80.0–100.0)
Monocytes Absolute: 1.8 K/uL — ABNORMAL HIGH (ref 0.2–1.0)
Monocytes Relative: 13 %
Neutro Abs: 10.6 K/uL — ABNORMAL HIGH (ref 1.4–6.5)
Neutrophils Relative %: 80 %
Platelets: 228 K/uL (ref 150–440)
RBC: 4.5 MIL/uL (ref 4.40–5.90)
RDW: 13.2 % (ref 11.5–14.5)
WBC: 13.3 K/uL — ABNORMAL HIGH (ref 3.8–10.6)

## 2018-01-19 LAB — COMPREHENSIVE METABOLIC PANEL WITH GFR
ALT: 17 U/L (ref 17–63)
AST: 17 U/L (ref 15–41)
Albumin: 3.9 g/dL (ref 3.5–5.0)
Alkaline Phosphatase: 58 U/L (ref 38–126)
Anion gap: 10 (ref 5–15)
BUN: 14 mg/dL (ref 6–20)
CO2: 24 mmol/L (ref 22–32)
Calcium: 8.7 mg/dL — ABNORMAL LOW (ref 8.9–10.3)
Chloride: 94 mmol/L — ABNORMAL LOW (ref 101–111)
Creatinine, Ser: 0.93 mg/dL (ref 0.61–1.24)
GFR calc Af Amer: >60 mL/min
GFR calc non Af Amer: >60 mL/min
Glucose, Bld: 115 mg/dL — ABNORMAL HIGH (ref 65–99)
Potassium: 3.8 mmol/L (ref 3.5–5.1)
Sodium: 128 mmol/L — ABNORMAL LOW (ref 135–145)
Total Bilirubin: 1.2 mg/dL (ref 0.3–1.2)
Total Protein: 7.4 g/dL (ref 6.5–8.1)

## 2018-01-19 MED ORDER — SODIUM CHLORIDE 0.9 % IV SOLN
INTRAVENOUS | Status: AC
  Administered 2018-01-20: 01:00:00 via INTRAVENOUS

## 2018-01-19 MED ORDER — TRAZODONE HCL 50 MG PO TABS
50.0000 mg | ORAL_TABLET | Freq: Every day | ORAL | Status: DC
  Administered 2018-01-20: 50 mg via ORAL
  Filled 2018-01-19: qty 1

## 2018-01-19 MED ORDER — ONDANSETRON HCL 4 MG PO TABS: 4.0000 mg | ORAL_TABLET | Freq: Four times a day (QID) | ORAL | Status: DC | PRN

## 2018-01-19 MED ORDER — OXYCODONE HCL 5 MG PO TABS: 5.0000 mg | ORAL_TABLET | ORAL | Status: DC | PRN

## 2018-01-19 MED ORDER — ONDANSETRON HCL 4 MG/2ML IJ SOLN: 4.0000 mg | Freq: Four times a day (QID) | INTRAMUSCULAR | Status: DC | PRN

## 2018-01-19 MED ORDER — PANTOPRAZOLE SODIUM 40 MG PO TBEC
40.0000 mg | DELAYED_RELEASE_TABLET | Freq: Every day | ORAL | Status: DC
  Administered 2018-01-20: 40 mg via ORAL
  Filled 2018-01-19: qty 1

## 2018-01-19 MED ORDER — ACETAMINOPHEN 325 MG PO TABS
650.0000 mg | ORAL_TABLET | Freq: Four times a day (QID) | ORAL | Status: DC | PRN
  Administered 2018-01-20 (×2): 650 mg via ORAL
  Filled 2018-01-19 (×2): qty 2

## 2018-01-19 MED ORDER — IOPAMIDOL (ISOVUE-300) INJECTION 61%
100.0000 mL | Freq: Once | INTRAVENOUS | Status: AC | PRN
  Administered 2018-01-19: 100 mL via INTRAVENOUS

## 2018-01-19 MED ORDER — ACETAMINOPHEN 650 MG RE SUPP: 650.0000 mg | Freq: Four times a day (QID) | RECTAL | Status: DC | PRN

## 2018-01-19 MED ORDER — METRONIDAZOLE IN NACL 5-0.79 MG/ML-% IV SOLN
500.0000 mg | Freq: Three times a day (TID) | INTRAVENOUS | Status: DC
  Administered 2018-01-20 – 2018-01-21 (×5): 500 mg via INTRAVENOUS
  Filled 2018-01-19 (×7): qty 100

## 2018-01-19 MED ORDER — CLOPIDOGREL BISULFATE 75 MG PO TABS
75.0000 mg | ORAL_TABLET | Freq: Every day | ORAL | Status: DC
  Administered 2018-01-20 – 2018-01-21 (×2): 75 mg via ORAL
  Filled 2018-01-19 (×3): qty 1

## 2018-01-19 MED ORDER — ENOXAPARIN SODIUM 40 MG/0.4ML ~~LOC~~ SOLN
40.0000 mg | SUBCUTANEOUS | Status: DC
  Administered 2018-01-20: 40 mg via SUBCUTANEOUS
  Filled 2018-01-19: qty 0.4

## 2018-01-19 MED ORDER — IRBESARTAN 150 MG PO TABS
300.0000 mg | ORAL_TABLET | Freq: Every day | ORAL | Status: DC
  Administered 2018-01-20 – 2018-01-21 (×2): 300 mg via ORAL
  Filled 2018-01-19 (×2): qty 2

## 2018-01-19 MED ORDER — ATORVASTATIN CALCIUM 10 MG PO TABS
10.0000 mg | ORAL_TABLET | Freq: Every day | ORAL | Status: DC
  Administered 2018-01-20: 10 mg via ORAL
  Filled 2018-01-19: qty 1

## 2018-01-19 MED ORDER — CIPROFLOXACIN IN D5W 400 MG/200ML IV SOLN
400.0000 mg | Freq: Two times a day (BID) | INTRAVENOUS | Status: DC
  Administered 2018-01-20 (×3): 400 mg via INTRAVENOUS
  Filled 2018-01-19 (×5): qty 200

## 2018-01-19 MED ORDER — SODIUM CHLORIDE 0.9 % IV BOLUS
1000.0000 mL | Freq: Once | INTRAVENOUS | Status: AC
  Administered 2018-01-19: 1000 mL via INTRAVENOUS

## 2018-01-19 MED ORDER — METOPROLOL SUCCINATE ER 50 MG PO TB24
50.0000 mg | ORAL_TABLET | Freq: Every day | ORAL | Status: DC
  Administered 2018-01-20 – 2018-01-21 (×2): 50 mg via ORAL
  Filled 2018-01-19 (×2): qty 1

## 2018-01-19 MED ORDER — ASPIRIN EC 81 MG PO TBEC
81.0000 mg | DELAYED_RELEASE_TABLET | Freq: Every day | ORAL | Status: DC
  Administered 2018-01-20 – 2018-01-21 (×2): 81 mg via ORAL
  Filled 2018-01-19 (×2): qty 1

## 2018-01-19 NOTE — ED Triage Notes (Signed)
Patient c/o lower abdominal pain that started today but has gotten worse throughout the day.  Patient reports some nausea earlier today.  Patient denies V/D.

## 2018-01-19 NOTE — Progress Notes (Signed)
Pharmacy Antibiotic Note  Jonathan Escobar is a 72 y.o. male admitted on 01/19/2018 with intra-abdominal infx.  Pharmacy has been consulted for ciprofloxacin dosing.  Plan: Will start cipro 400 mg IV q12h   No recent EKG last was on 04/12 and QTc 439     Temp (24hrs), Avg:98.8 F (37.1 C), Min:98.8 F (37.1 C), Max:98.8 F (37.1 C)  Recent Labs  Lab 01/19/18 1907  WBC 13.3*  CREATININE 0.93    Estimated Creatinine Clearance: 86.1 mL/min (by C-G formula based on SCr of 0.93 mg/dL).    Allergies  Allergen Reactions  . Norvasc [Amlodipine Besylate]     Dizzy  . Ace Inhibitors Other (See Comments)    Cough   . Amoxicillin Rash    Flu like symptoms    Thank you for allowing pharmacy to be a part of this patient's care.  Tobie Lords, PharmD, BCPS Clinical Pharmacist 01/19/2018

## 2018-01-19 NOTE — ED Provider Notes (Signed)
[facility]  CSN: [id number] Arrival date & time: 01/19/18  1931  History   Chief Complaint Chief Complaint  Patient presents with  . Abdominal Pain   HPI  72 year old male with an extensive cardiac history presents with abdominal pain.  Patient reports that his pain started on Sunday.  Was preceded by nausea.  He reports bilateral lower abdominal pain.  Moderate to severe.  Has progressed throughout the day.  Denies hematochezia or melena.  He had a small soft bowel movement today.  No diarrhea.  No vomiting.  Pain is his predominant symptom.  He has felt feverish but has had no documented fever.  Patient endorses a prior history of diverticulosis on colonoscopy.  He is never had diverticulitis.  Patient states that his pain is exacerbated by standing and activity.  No relieving factors.  No other associated symptoms.  No other complaints.  Past Medical History:  Diagnosis Date  . CAD (coronary artery disease), autologous vein bypass graft Nov 2014   Inf STEMI - PCI to SVG-RPDA:   . CAD S/P percutaneous coronary angioplasty 1999   BMS to prox RCA 1999; Brachytherapy ~2003, followed by DES  PCI ([facility]) for ISR.  Marland Kitchen Coronary stent restenosis due to progression of disease 2000; 2005   (While Still In [place]) Status post Brachytherapy to RCA ISR - 2000; Redo PCI with DES 2005  . Essential hypertension    CONTROLLED ON MEDS  . GERD (gastroesophageal reflux disease)   . History of GI bleed     while on Plavix  . History of viral meningitis    following GI bleed   . History of: ST elevation myocardial infarction (STEMI) of inferior wall, subsequent episode of care April 25, 2012;   RCA: 95% ISR followed by 100% stenosis post-stent with significant RPL lesions; LAD: Tandem 80 and 70% lesions involving D1 (1, 1, 1) --> initial angioplasty to RCA aspiration thrombectomy --> urgent CABG;  . Hyperlipidemia LDL goal <70   . Obesity (BMI 30.0-34.9)   . S/P CABG x 4: LIMA-LAD, seq  SVG-PD-PLA; SVG-diag. 04/28/2012  . S/P coronary artery stent placement Nov-Dec 2014   a) Inf STEMIL 11/28/'14 --> extensive (full metal Jacket) of SVG-RPDA Promus DES - 3 mm x 38 mm, 3 mm x 38 mm, 3 mm x 20 mm, 2.75 mm x 38 mm -- all postdilated to ~3.3 mm;; b) 12/1/'14: staged PCI to distal SVG-diagonal: Xience Xpedition 2.5 mm x 20 mm (2.66 mm)  . ST elevation myocardial infarction (STEMI) of inferior wall, subsequent episode of care [facility])  Jul 29, 2013   Diffuse near occlusion of SVG-rPDA, 90% SVG-Diag    Patient Active Problem List   Diagnosis Date Noted  . Non-recurrent unilateral inguinal hernia without obstruction or gangrene   . Right inguinal hernia 07/02/2016  . Personal history of other malignant neoplasm of skin 10/08/2015  . Essential hypertension 07/19/2014  . Fatigue 01/05/2014  . Heart palpitations 09/10/2013  . Obesity (BMI 30-39.9) 08/09/2013  . STEMI 07/29/13- successful complex full metal jacket stenting of the SVG-PDA/PLA -07/29/2013, staged SVG-Dx PCI 12/01 07/29/2013  . CAD (coronary artery disease), autologous vein bypass graft --> full metal jacket PCI of SVG-RCA; PCI of SVG-D1 anastomosis 07/02/2013  . Hyperlipidemia LDL goal <70   . 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 04/29/2012  . ST elevation myocardial infarction (STEMI) of inferior wall, initial episode of care - s/p PTCA of 95% ISR to 70% and 100% distal stent  occlusion to ~40% (8/25) 04/25/2012  . H/O: GI bleed - 2007 on Plavix 04/25/2012  . CAD in native artery - prior Inferior MI - RCA PCI; 04/2012 - Inferior STEMI - RCA occluded (unable to dilate lesion adequately) + LAD & D1 disease --> referred for CABG 04/07/2012    Past Surgical History:  Procedure Laterality Date  . CARDIAC CATHETERIZATION  04/28/12   RCA: 95% ISR followed by 100% stenosis post-stent with significant RPL lesions; LAD: Tandem 80 and 70% lesions involving D1 (1, 1, 1) --> initial angioplasty to RCA aspiration  thrombectomy --> urgent CABG;  . CORONARY ARTERY BYPASS GRAFT  04/28/2012   Procedure: CORONARY ARTERY BYPASS GRAFTING (CABG);  Surgeon: Gaye Pollack, MD;  Location: [place];  Service: Open Heart Surgery;  Laterality: N/A;  . HERNIA REPAIR Left   . INGUINAL HERNIA REPAIR Right 08/06/2016   Procedure: HERNIA REPAIR INGUINAL ADULT;  Surgeon: Clayburn Pert, MD;  Location: [place];  Service: General;  Laterality: Right;  . LEFT AND RIGHT HEART CATHETERIZATION WITH CORONARY ANGIOGRAM N/A 04/25/2012   Procedure: LEFT AND RIGHT HEART CATHETERIZATION WITH CORONARY ANGIOGRAM;  Surgeon: Leonie Man, MD;  Location: [facility] CATH LAB;  Service: Cardiovascular;  Laterality: N/A;  . LEFT HEART CATHETERIZATION WITH CORONARY/GRAFT ANGIOGRAM  07/29/2013   Procedure: LEFT HEART CATHETERIZATION WITH Beatrix Fetters;  Surgeon: Blane Ohara, MD;  Location: [facility] CATH LAB;  Service: Cardiovascular; ; 95% stenosis with percent mid occlusion of SVG-RPDA; 90% anastomotic SVG-diagonal, moderate 50% disease in the native circumflex with patent OM 1 OM 2. 80-90% proximal LAD with patent LIMA to LAD , EF 50%  . MOHS SURGERY     FOREHEAD  . NM MYOVIEW LTD  Jan 2015   Moderate Inferior infarct (as expected), but no ischemia & EF ~45-50%  . PERCUTANEOUS CORONARY STENT INTERVENTION (PCI-S)  07/29/2013   Procedure: PERCUTANEOUS CORONARY STENT INTERVENTION (PCI-S);  Surgeon: Blane Ohara, MD;  Location: [facility] CATH LAB;  Service: Cardiovascular;; PCI to SVG-RPDA: Promus DES - 3 mm x 38 mm, 3 mm x 38 mm, 3 mm x 20 mm, 2.75 mm x 38 mm -- all postdilated to ~3.3 mm;  . PERCUTANEOUS CORONARY STENT INTERVENTION (PCI-S) N/A 08/01/2013   Procedure: PERCUTANEOUS CORONARY STENT INTERVENTION (PCI-S);  Surgeon: Leonie Man, MD;  Location[facility] CATH LAB;  Service: Cardiovascular;staged PCI to distal SVG-diagonal: Xience Xpedition 2.5 mm x 20 mm (2.66 mm)    . TRANSTHORACIC ECHOCARDIOGRAM  12/'13; 07/30/2013   a) Post CABG:  EF 50-55%, moderate HK of anteroseptal wall, septal dyskinesis/dyssynergy due to poststernotomy;  grade 1 diastolic dysfunction. Mildly dilated LA, mildly reduced RV function-likely due to septal dyssynergy.; b) 11/'14 Post Inferior STEMI: EF 50-55% mild LVH. Mild hypokinesis of the distal lateral, and basal to mid inferior lateral and inferior walls -- consistent with RCA infarct   Home Medications    Prior to Admission medications   Medication Sig Start Date End Date Taking? Authorizing Provider  acetaminophen (TYLENOL) 325 MG tablet Take 2 tablets (650 mg total) by mouth every 4 (four) hours as needed for headache or mild pain. 08/02/13   Erlene Quan, PA-C  Ascorbic Acid (VITAMIN C) 1000 MG tablet Take 1,000 mg by mouth daily.    [provider]  aspirin 81 MG tablet Take 81 mg by mouth daily.    [provider]  atorvastatin (LIPITOR) 10 MG tablet Take 1 tablet (10 mg total) by mouth daily. 11/03/17 02/01/18  Fletcher Anon,  Mertie Clause, MD  cetirizine (ZYRTEC) 10 MG tablet Take 10 mg by mouth daily. PM    [provider]  clopidogrel (PLAVIX) 75 MG tablet Take 1 tablet (75 mg total) by mouth daily with breakfast. 08/02/13   Erlene Quan, PA-C  ezetimibe (ZETIA) 10 MG tablet Take 1 tablet (10 mg total) daily by mouth. 07/20/17 12/11/17  Wellington Hampshire, MD  metoprolol succinate (TOPROL-XL) 50 MG 24 hr tablet Take 1 tablet (50 mg total) by mouth daily. Take with or immediately following a meal. 06/01/17   Arida, Mertie Clause, MD  NITROSTAT 0.4 MG SL tablet PLACE 1 TABLET UNDER THE TONGUE EVERY 5 MINUTES X 3 DOSES AS NEEDED FOR CHEST PAIN. 04/01/17   Leonie Man, MD  pantoprazole (PROTONIX) 40 MG tablet Take 1 tablet (40 mg total) by mouth daily at 6 (six) AM. Patient taking differently: Take 40 mg by mouth daily at 6 (six) AM. PM 08/02/13   Kilroy, Doreene Burke, PA-C  traZODone (DESYREL) 50 MG tablet Take 50 mg by mouth at bedtime.    [provider]  valsartan (DIOVAN) 320 MG  tablet Take 1 tablet (320 mg total) by mouth daily. PM 06/01/17   Wellington Hampshire, MD   Family History Family History  Problem Relation Age of Onset  . Alzheimer's disease Mother   . Heart failure Father   . Coronary artery disease Father   . Valvular heart disease Sister    Social History Social History   Tobacco Use  . Smoking status: Former Smoker    Packs/day: 1.00    Years: 10.00    Pack years: 10.00    Types: Cigarettes    Last attempt to quit: 09/01/1976    Years since quitting: 41.4  . Smokeless tobacco: Never Used  Substance Use Topics  . Alcohol use: Yes    Alcohol/week: 1.2 oz    Types: 2 Shots of liquor per week    Comment: 1-2 shots of jack daniels a day.  . Drug use: No    Allergies   Norvasc [amlodipine besylate]; Ace inhibitors; and Amoxicillin   Review of Systems Review of Systems  Constitutional:       Has felt feverish.  Gastrointestinal: Positive for abdominal pain and nausea. Negative for blood in stool.   Physical Exam Triage Vital Signs ED Triage Vitals  Enc Vitals Group     BP 01/19/18 2016 139/87     Pulse Rate 01/19/18 2016 77     Resp 01/19/18 2016 16     Temp 01/19/18 2016 98.8 F (37.1 C)     Temp Source 01/19/18 2016 Oral     SpO2 01/19/18 2016 99 %     Weight 01/19/18 2014 227 lb (103 kg)     Height --      Head Circumference --      Peak Flow --      Pain Score 01/19/18 2014 8     Pain Loc --      Pain Edu? --      Excl. in [place]? --    Updated Vital Signs BP 139/87 (BP Location: Right Arm)   Pulse 77   Temp 98.8 F (37.1 C) (Oral)   Resp 16   Wt 227 lb (103 kg)   SpO2 99%   BMI 33.52 kg/m   Physical Exam  Constitutional: He is oriented to person, place, and time. He appears well-developed.  Appears in pain.  HENT:  Head: Normocephalic  and atraumatic.  Eyes: Conjunctivae are normal. No scleral icterus.  Cardiovascular: Normal rate and regular rhythm.  Pulmonary/Chest: Effort normal and breath sounds normal. He  has no wheezes. He has no rales.  Abdominal:  Soft, nondistended.  Exquisitely tender to palpation in the left lower quadrant.  Mild tenderness in the right lower quadrant.  Neurological: He is alert and oriented to person, place, and time.  Psychiatric: He has a normal mood and affect. His behavior is normal.  Nursing note and vitals reviewed.  UC Treatments / Results  Labs (all labs ordered are listed, but only abnormal results are displayed) Labs Reviewed - No data to display  EKG None  Radiology Ct Abdomen Pelvis W Contrast  Result Date: 01/19/2018 CLINICAL DATA:  71 year old male with a history of left lower quadrant pain EXAM: CT ABDOMEN AND PELVIS WITH CONTRAST TECHNIQUE: Multidetector CT imaging of the abdomen and pelvis was performed using the standard protocol following bolus administration of intravenous contrast. CONTRAST:  110mL ISOVUE-300 IOPAMIDOL (ISOVUE-300) INJECTION 61% COMPARISON:  None. FINDINGS: Lower chest: No acute abnormality. Surgical changes of median sternotomy and CABG Hepatobiliary: There are 2 small hypodense/hypoenhancing foci within the right liver measuring 6 mm, compatible with benign cysts. Unremarkable gallbladder Pancreas: Unremarkable pancreas Spleen: Unremarkable spleen Adrenals/Urinary Tract: Unremarkable adrenal glands. Right kidney without hydronephrosis or nephrolithiasis. No perinephric stranding. Hypodense region in the posterior cortex of the right kidney is too small to characterized, though likely benign. Left kidney without hydronephrosis or nephrolithiasis. No perinephric stranding. The proximal left ureter traverses the inflammatory changes of the left abdomen. Stomach/Bowel: Unremarkable stomach. Unremarkable small bowel. No abnormal distention. No small bowel or transition. No inflammatory changes adjacent to small bowel loops. Normal appendix. Decompressed proximal colon. No significant stool burden. Significant inflammatory changes of the  sigmoid colon associated with diverticular change. No extraluminal air or focal fluid collection. Thickening of the fascial planes with local edema extending to the superior left urinary bladder. The urinary bladder is thickened at this site. Vascular/Lymphatic: Atherosclerotic changes of the abdominal aorta. Mesenteric arteries are patent. Bilateral renal arteries are patent. Bilateral iliac arteries and proximal femoral arteries are patent. Reproductive: Transverse diameter of the prostate measures 5.7 cm. Other: Bilateral fat containing inguinal hernia. Fat containing umbilical hernia. Musculoskeletal: No acute displaced fracture. Multilevel degenerative changes. No bony canal narrowing. IMPRESSION: Acute diverticulitis of the sigmoid colon. Negative for abscess or free intraperitoneal air. Focal wall thickening of the superior left urinary bladder, likely reactive given the adjacent diverticulitis. Aortic Atherosclerosis (ICD10-I70.0). These results were called by telephone at the time of interpretation on 01/19/2018 at 8:52 pm to Dr. Thersa Salt , who verbally acknowledged these results. Electronically Signed   By: Corrie Mckusick D.O.   On: 01/19/2018 20:53    Procedures Procedures (including critical care time)  Medications Ordered in UC Medications  sodium chloride 0.9 % bolus 1,000 mL (0 mLs Intravenous Stopped 01/19/18 2104)    Initial Impression / Assessment and Plan / UC Course  I have reviewed the triage vital signs and the nursing notes.  Pertinent labs & imaging results that were available during my care of the patient were reviewed by me and considered in my medical decision making (see chart for details).    72 year old male presents with severe abdominal pain. Labs with hyponatremia and leukocytosis. Normal saline bolus given here. CT abd and pelvis with acute sigmoid diverticulitis.  Discussed case with hospitalist Dr. Jannifer Franklin.  Will direct admit to [facility].  Final  Clinical  Impressions(s) / UC Diagnoses   Final diagnoses:  Acute diverticulitis   Discharge Instructions   None    ED Prescriptions    None     Controlled Substance Prescriptions [place] Controlled Substance Registry consulted? Not Applicable   Coral Spikes, DO 01/19/18 2118

## 2018-01-19 NOTE — H&P (Signed)
[facility] - [place] at [facility]   PATIENT NAME: Jonathan Escobar    MR#:  [id number]  DATE OF BIRTH:  12-22-1945  DATE OF ADMISSION:  01/19/2018  PRIMARY CARE PHYSICIAN: Reubin Milan, MD   REQUESTING/REFERRING PHYSICIAN: Adriana Simas, MD  CHIEF COMPLAINT:  Abdominal Pain  HISTORY OF PRESENT ILLNESS:  Jonathan Escobar  is a 72 y.o. male who presents with abdominal pain for the past 2 days, associated with nausea.  Patient was seen in urgent care and found to have an elevated white blood cell count.  On CT imaging he was found of diverticulitis.  Hospitalist were called for direct admission and treatment.  PAST MEDICAL HISTORY:   Past Medical History:  Diagnosis Date  . CAD (coronary artery disease), autologous vein bypass graft Nov 2014   Inf STEMI - PCI to SVG-RPDA:   . CAD S/P percutaneous coronary angioplasty 1999   BMS to prox RCA 1999; Brachytherapy ~2003, followed by DES  PCI ([facility]) for ISR.  Marland Kitchen Coronary stent restenosis due to progression of disease 2000; 2005   (While Still In [place]) Status post Brachytherapy to RCA ISR - 2000; Redo PCI with DES 2005  . Essential hypertension    CONTROLLED ON MEDS  . GERD (gastroesophageal reflux disease)   . History of GI bleed     while on Plavix  . History of viral meningitis    following GI bleed   . History of: ST elevation myocardial infarction (STEMI) of inferior wall, subsequent episode of care April 25, 2012;   RCA: 95% ISR followed by 100% stenosis post-stent with significant RPL lesions; LAD: Tandem 80 and 70% lesions involving D1 (1, 1, 1) --> initial angioplasty to RCA aspiration thrombectomy --> urgent CABG;  . Hyperlipidemia LDL goal <70   . Obesity (BMI 30.0-34.9)   . S/P CABG x 4: LIMA-LAD, seq SVG-PD-PLA; SVG-diag. 04/28/2012  . S/P coronary artery stent placement Nov-Dec 2014   a) Inf STEMIL 11/28/'14 --> extensive (full metal Jacket) of SVG-RPDA Promus DES - 3 mm x 38 mm, 3 mm x 38 mm, 3 mm x  20 mm, 2.75 mm x 38 mm -- all postdilated to ~3.3 mm;; b) 12/1/'14: staged PCI to distal SVG-diagonal: Xience Xpedition 2.5 mm x 20 mm (2.66 mm)  . ST elevation myocardial infarction (STEMI) of inferior wall, subsequent episode of care [facility])  Jul 29, 2013   Diffuse near occlusion of SVG-rPDA, 90% SVG-Diag     PAST SURGICAL HISTORY:   Past Surgical History:  Procedure Laterality Date  . CARDIAC CATHETERIZATION  04/28/12   RCA: 95% ISR followed by 100% stenosis post-stent with significant RPL lesions; LAD: Tandem 80 and 70% lesions involving D1 (1, 1, 1) --> initial angioplasty to RCA aspiration thrombectomy --> urgent CABG;  . CORONARY ARTERY BYPASS GRAFT  04/28/2012   Procedure: CORONARY ARTERY BYPASS GRAFTING (CABG);  Surgeon: Alleen Borne, MD;  Location: [facility] OR;  Service: Open Heart Surgery;  Laterality: N/A;  . HERNIA REPAIR Left   . INGUINAL HERNIA REPAIR Right 08/06/2016   Procedure: HERNIA REPAIR INGUINAL ADULT;  Surgeon: Ricarda Frame, MD;  Location: [facility];  Service: General;  Laterality: Right;  . LEFT AND RIGHT HEART CATHETERIZATION WITH CORONARY ANGIOGRAM N/A 04/25/2012   Procedure: LEFT AND RIGHT HEART CATHETERIZATION WITH CORONARY ANGIOGRAM;  Surgeon: Marykay Lex, MD;  Location: [facility] CATH LAB;  Service: Cardiovascular;  Laterality: N/A;  . LEFT HEART CATHETERIZATION WITH CORONARY/GRAFT ANGIOGRAM  07/29/2013  Procedure: LEFT HEART CATHETERIZATION WITH Isabel Caprice;  Surgeon: Micheline Chapman, MD;  Location: [facility] CATH LAB;  Service: Cardiovascular; ; 95% stenosis with percent mid occlusion of SVG-RPDA; 90% anastomotic SVG-diagonal, moderate 50% disease in the native circumflex with patent OM 1 OM 2. 80-90% proximal LAD with patent LIMA to LAD , EF 50%  . MOHS SURGERY     FOREHEAD  . NM MYOVIEW LTD  Jan 2015   Moderate Inferior infarct (as expected), but no ischemia & EF ~45-50%  . PERCUTANEOUS CORONARY STENT INTERVENTION (PCI-S)  07/29/2013   Procedure:  PERCUTANEOUS CORONARY STENT INTERVENTION (PCI-S);  Surgeon: Micheline Chapman, MD;  Location: [facility] CATH LAB;  Service: Cardiovascular;; PCI to SVG-RPDA: Promus DES - 3 mm x 38 mm, 3 mm x 38 mm, 3 mm x 20 mm, 2.75 mm x 38 mm -- all postdilated to ~3.3 mm;  . PERCUTANEOUS CORONARY STENT INTERVENTION (PCI-S) N/A 08/01/2013   Procedure: PERCUTANEOUS CORONARY STENT INTERVENTION (PCI-S);  Surgeon: Marykay Lex, MD;  Location: [facility] CATH LAB;  Service: Cardiovascular;staged PCI to distal SVG-diagonal: Xience Xpedition 2.5 mm x 20 mm (2.66 mm)    . TRANSTHORACIC ECHOCARDIOGRAM  12/'13; 07/30/2013   a) Post CABG: EF 50-55%, moderate HK of anteroseptal wall, septal dyskinesis/dyssynergy due to poststernotomy;  grade 1 diastolic dysfunction. Mildly dilated LA, mildly reduced RV function-likely due to septal dyssynergy.; b) 11/'14 Post Inferior STEMI: EF 50-55% mild LVH. Mild hypokinesis of the distal lateral, and basal to mid inferior lateral and inferior walls -- consistent with RCA infarct     SOCIAL HISTORY:   Social History   Tobacco Use  . Smoking status: Former Smoker    Packs/day: 1.00    Years: 10.00    Pack years: 10.00    Types: Cigarettes    Last attempt to quit: 09/01/1976    Years since quitting: 41.4  . Smokeless tobacco: Never Used  Substance Use Topics  . Alcohol use: Yes    Alcohol/week: 1.2 oz    Types: 2 Shots of liquor per week    Comment: 1-2 shots of jack daniels a day.     FAMILY HISTORY:   Family History  Problem Relation Age of Onset  . Alzheimer's disease Mother   . Heart failure Father   . Coronary artery disease Father   . Valvular heart disease Sister      DRUG ALLERGIES:   Allergies  Allergen Reactions  . Norvasc [Amlodipine Besylate]     Dizzy  . Ace Inhibitors Other (See Comments)    Cough   . Amoxicillin Rash    Flu like symptoms    MEDICATIONS AT HOME:   Prior to Admission medications   Medication Sig Start Date End Date Taking? Authorizing  Provider  acetaminophen (TYLENOL) 325 MG tablet Take 2 tablets (650 mg total) by mouth every 4 (four) hours as needed for headache or mild pain. 08/02/13   Abelino Derrick, PA-C  Ascorbic Acid (VITAMIN C) 1000 MG tablet Take 1,000 mg by mouth daily.    [provider]  aspirin 81 MG tablet Take 81 mg by mouth daily.    [provider]  atorvastatin (LIPITOR) 10 MG tablet Take 1 tablet (10 mg total) by mouth daily. 11/03/17 02/01/18  Iran Ouch, MD  cetirizine (ZYRTEC) 10 MG tablet Take 10 mg by mouth daily. PM    [provider]  clopidogrel (PLAVIX) 75 MG tablet Take 1 tablet (75 mg total) by mouth daily with  breakfast. 08/02/13   Abelino Derrick, PA-C  ezetimibe (ZETIA) 10 MG tablet Take 1 tablet (10 mg total) daily by mouth. 07/20/17 12/11/17  Iran Ouch, MD  metoprolol succinate (TOPROL-XL) 50 MG 24 hr tablet Take 1 tablet (50 mg total) by mouth daily. Take with or immediately following a meal. 06/01/17   Arida, Chelsea Aus, MD  NITROSTAT 0.4 MG SL tablet PLACE 1 TABLET UNDER THE TONGUE EVERY 5 MINUTES X 3 DOSES AS NEEDED FOR CHEST PAIN. 04/01/17   Marykay Lex, MD  pantoprazole (PROTONIX) 40 MG tablet Take 1 tablet (40 mg total) by mouth daily at 6 (six) AM. Patient taking differently: Take 40 mg by mouth daily at 6 (six) AM. PM 08/02/13   Kilroy, Eda Paschal, PA-C  traZODone (DESYREL) 50 MG tablet Take 50 mg by mouth at bedtime.    [provider]  valsartan (DIOVAN) 320 MG tablet Take 1 tablet (320 mg total) by mouth daily. PM 06/01/17   Iran Ouch, MD    REVIEW OF SYSTEMS:  Review of Systems  Constitutional: Negative for chills, fever, malaise/fatigue and weight loss.  HENT: Negative for ear pain, hearing loss and tinnitus.   Eyes: Negative for blurred vision, double vision, pain and redness.  Respiratory: Negative for cough, hemoptysis and shortness of breath.   Cardiovascular: Negative for chest pain, palpitations, orthopnea and leg swelling.   Gastrointestinal: Positive for abdominal pain and nausea. Negative for constipation, diarrhea and vomiting.  Genitourinary: Negative for dysuria, frequency and hematuria.  Musculoskeletal: Negative for back pain, joint pain and neck pain.  Skin:       No acne, rash, or lesions  Neurological: Negative for dizziness, tremors, focal weakness and weakness.  Endo/Heme/Allergies: Negative for polydipsia. Does not bruise/bleed easily.  Psychiatric/Behavioral: Negative for depression. The patient is not nervous/anxious and does not have insomnia.      VITAL SIGNS:  There were no vitals filed for this visit. Wt Readings from Last 3 Encounters:  01/19/18 103 kg (227 lb)  12/11/17 103.1 kg (227 lb 4 oz)  06/01/17 100.8 kg (222 lb 4 oz)    PHYSICAL EXAMINATION:  Physical Exam  Vitals reviewed. Constitutional: He is oriented to person, place, and time. He appears well-developed and well-nourished. No distress.  HENT:  Head: Normocephalic and atraumatic.  Mouth/Throat: Oropharynx is clear and moist.  Eyes: Pupils are equal, round, and reactive to light. Conjunctivae and EOM are normal. No scleral icterus.  Neck: Normal range of motion. Neck supple. No JVD present. No thyromegaly present.  Cardiovascular: Normal rate, regular rhythm and intact distal pulses. Exam reveals no gallop and no friction rub.  No murmur heard. Respiratory: Effort normal and breath sounds normal. No respiratory distress. He has no wheezes. He has no rales.  GI: Soft. Bowel sounds are normal. He exhibits no distension. There is tenderness.  Musculoskeletal: Normal range of motion. He exhibits no edema.  No arthritis, no gout  Lymphadenopathy:    He has no cervical adenopathy.  Neurological: He is alert and oriented to person, place, and time. No cranial nerve deficit.  No dysarthria, no aphasia  Skin: Skin is warm and dry. No rash noted. No erythema.  Psychiatric: He has a normal mood and affect. His behavior is  normal. Judgment and thought content normal.    LABORATORY PANEL:   CBC Recent Labs  Lab 01/19/18 1907  WBC 13.3*  HGB 13.8  HCT 40.5  PLT 228   ------------------------------------------------------------------------------------------------------------------  Chemistries  Recent  Labs  Lab 01/19/18 1907  NA 128*  K 3.8  CL 94*  CO2 24  GLUCOSE 115*  BUN 14  CREATININE 0.93  CALCIUM 8.7*  AST 17  ALT 17  ALKPHOS 58  BILITOT 1.2   ------------------------------------------------------------------------------------------------------------------  Cardiac Enzymes No results for input(s): TROPONINI in the last 168 hours. ------------------------------------------------------------------------------------------------------------------  RADIOLOGY:  Ct Abdomen Pelvis W Contrast  Result Date: 01/19/2018 CLINICAL DATA:  72 year old male with a history of left lower quadrant pain EXAM: CT ABDOMEN AND PELVIS WITH CONTRAST TECHNIQUE: Multidetector CT imaging of the abdomen and pelvis was performed using the standard protocol following bolus administration of intravenous contrast. CONTRAST:  ISOVUE-300 IOPAMIDOL (ISOVUE-300) INJECTION 61% COMPARISON:  None. FINDINGS: Lower chest: No acute abnormality. Surgical changes of median sternotomy and CABG Hepatobiliary: There are 2 small hypodense/hypoenhancing foci within the right liver measuring 6 mm, compatible with benign cysts. Unremarkable gallbladder Pancreas: Unremarkable pancreas Spleen: Unremarkable spleen Adrenals/Urinary Tract: Unremarkable adrenal glands. Right kidney without hydronephrosis or nephrolithiasis. No perinephric stranding. Hypodense region in the posterior cortex of the right kidney is too small to characterized, though likely benign. Left kidney without hydronephrosis or nephrolithiasis. No perinephric stranding. The proximal left ureter traverses the inflammatory changes of the left abdomen. Stomach/Bowel:  Unremarkable stomach. Unremarkable small bowel. No abnormal distention. No small bowel or transition. No inflammatory changes adjacent to small bowel loops. Normal appendix. Decompressed proximal colon. No significant stool burden. Significant inflammatory changes of the sigmoid colon associated with diverticular change. No extraluminal air or focal fluid collection. Thickening of the fascial planes with local edema extending to the superior left urinary bladder. The urinary bladder is thickened at this site. Vascular/Lymphatic: Atherosclerotic changes of the abdominal aorta. Mesenteric arteries are patent. Bilateral renal arteries are patent. Bilateral iliac arteries and proximal femoral arteries are patent. Reproductive: Transverse diameter of the prostate measures 5.7 cm. Other: Bilateral fat containing inguinal hernia. Fat containing umbilical hernia. Musculoskeletal: No acute displaced fracture. Multilevel degenerative changes. No bony canal narrowing. IMPRESSION: Acute diverticulitis of the sigmoid colon. Negative for abscess or free intraperitoneal air. Focal wall thickening of the superior left urinary bladder, likely reactive given the adjacent diverticulitis. Aortic Atherosclerosis (ICD10-I70.0). These results were called by telephone at the time of interpretation on 01/19/2018 at 8:52 pm to Dr. Everlene Other , who verbally acknowledged these results. Electronically Signed   By: Gilmer Mor D.O.   On: 01/19/2018 20:53    EKG:   Orders placed or performed in visit on 12/11/17  . EKG 12-Lead    IMPRESSION AND PLAN:  Principal Problem:   Diverticulitis -IV antibiotics, PRN analgesia and antiemetics, IV fluids Active Problems:   CAD (coronary artery disease), autologous vein bypass graft --> full metal jacket PCI of SVG-RCA; PCI of SVG-D1 anastomosis -continue home meds   Essential hypertension -home dose antihypertensives   Hyperlipidemia LDL goal <70 -Home dose antilipid   GERD  (gastroesophageal reflux disease) -Home dose PPI  Chart review performed and case discussed with ED provider. Labs, imaging and/or ECG reviewed by provider and discussed with patient/family. Management plans discussed with the patient and/or family.  DVT PROPHYLAXIS: SubQ lovenox  GI PROPHYLAXIS: PPI  ADMISSION STATUS: Observation  CODE STATUS: Full Code Status History    Date Active Date Inactive Code Status Order ID Comments User Context   07/29/2013 1654 08/02/2013 1407 Full Code [id number]  Tonny Bollman, MD Inpatient   04/30/2012 1607 05/02/2012 1903 Full Code [id number]  Josefina Do, RN Inpatient   04/28/2012  1444 04/30/2012 1607 Full Code [id number]  Jearld Pies, RN Inpatient    Advance Directive Documentation     Most Recent Value  Type of Advance Directive  Healthcare Power of Attorney, Living will  Pre-existing out of facility DNR order (yellow form or pink MOST form)  -  "MOST" Form in Place?  -      TOTAL TIME TAKING CARE OF THIS PATIENT: 40 minutes.   Jonathan Escobar FIELDING 01/19/2018, 10:58 PM  [facility]  Office  [phone]  CC: Primary care physician; Reubin Milan, MD  Note:  This document was prepared using Dragon voice recognition software and may include unintentional dictation errors.

## 2018-01-20 ENCOUNTER — Encounter: Payer: Self-pay | Admitting: *Deleted

## 2018-01-20 DIAGNOSIS — E876 Hypokalemia: Secondary | ICD-10-CM | POA: Diagnosis not present

## 2018-01-20 DIAGNOSIS — I2510 Atherosclerotic heart disease of native coronary artery without angina pectoris: Secondary | ICD-10-CM | POA: Diagnosis not present

## 2018-01-20 DIAGNOSIS — E785 Hyperlipidemia, unspecified: Secondary | ICD-10-CM | POA: Diagnosis not present

## 2018-01-20 DIAGNOSIS — Z955 Presence of coronary angioplasty implant and graft: Secondary | ICD-10-CM | POA: Diagnosis not present

## 2018-01-20 DIAGNOSIS — Z951 Presence of aortocoronary bypass graft: Secondary | ICD-10-CM | POA: Diagnosis not present

## 2018-01-20 DIAGNOSIS — I10 Essential (primary) hypertension: Secondary | ICD-10-CM | POA: Diagnosis not present

## 2018-01-20 DIAGNOSIS — K5732 Diverticulitis of large intestine without perforation or abscess without bleeding: Secondary | ICD-10-CM | POA: Diagnosis not present

## 2018-01-20 DIAGNOSIS — K219 Gastro-esophageal reflux disease without esophagitis: Secondary | ICD-10-CM | POA: Diagnosis not present

## 2018-01-20 DIAGNOSIS — K5792 Diverticulitis of intestine, part unspecified, without perforation or abscess without bleeding: Secondary | ICD-10-CM | POA: Diagnosis not present

## 2018-01-20 DIAGNOSIS — E871 Hypo-osmolality and hyponatremia: Secondary | ICD-10-CM | POA: Diagnosis not present

## 2018-01-20 LAB — BASIC METABOLIC PANEL
ANION GAP: 11 (ref 5–15)
Anion gap: 8 (ref 5–15)
BUN: 10 mg/dL (ref 6–20)
BUN: 10 mg/dL (ref 6–20)
CALCIUM: 8.2 mg/dL — AB (ref 8.9–10.3)
CALCIUM: 8.1 mg/dL — AB (ref 8.9–10.3)
CHLORIDE: 95 mmol/L — AB (ref 101–111)
CO2: 21 mmol/L — ABNORMAL LOW (ref 22–32)
CO2: 22 mmol/L (ref 22–32)
CREATININE: 0.72 mg/dL (ref 0.61–1.24)
Chloride: 94 mmol/L — ABNORMAL LOW (ref 101–111)
Creatinine, Ser: 0.71 mg/dL (ref 0.61–1.24)
GFR calc non Af Amer: >60 mL/min (ref >60)
GLUCOSE: 127 mg/dL — AB (ref 65–99)
Glucose, Bld: 108 mg/dL — ABNORMAL HIGH (ref 65–99)
Potassium: 3.2 mmol/L — ABNORMAL LOW (ref 3.5–5.1)
Potassium: 3.9 mmol/L (ref 3.5–5.1)
SODIUM: 125 mmol/L — AB (ref 135–145)
Sodium: 126 mmol/L — ABNORMAL LOW (ref 135–145)

## 2018-01-20 LAB — MAGNESIUM: MAGNESIUM: 1.4 mg/dL — AB (ref 1.7–2.4)

## 2018-01-20 LAB — CBC
HCT: 38.3 % — ABNORMAL LOW (ref 40.0–52.0)
HEMOGLOBIN: 13.4 g/dL (ref 13.0–18.0)
MCH: 31.5 pg (ref 26.0–34.0)
MCHC: 35 g/dL (ref 32.0–36.0)
MCV: 89.9 fL (ref 80.0–100.0)
PLATELETS: 197 10*3/uL (ref 150–440)
RBC: 4.26 MIL/uL — ABNORMAL LOW (ref 4.40–5.90)
RDW: 13.1 % (ref 11.5–14.5)
WBC: 15.4 10*3/uL — ABNORMAL HIGH (ref 3.8–10.6)

## 2018-01-20 LAB — OSMOLALITY: OSMOLALITY: 262 mosm/kg — AB (ref 275–295)

## 2018-01-20 LAB — SODIUM, URINE, RANDOM: SODIUM UR: 33 mmol/L

## 2018-01-20 MED ORDER — MAGNESIUM SULFATE 4 GM/100ML IV SOLN
4.0000 g | Freq: Once | INTRAVENOUS | Status: AC
  Administered 2018-01-20: 4 g via INTRAVENOUS
  Filled 2018-01-20: qty 100

## 2018-01-20 MED ORDER — POTASSIUM CHLORIDE CRYS ER 20 MEQ PO TBCR
40.0000 meq | EXTENDED_RELEASE_TABLET | Freq: Once | ORAL | Status: AC
Start: 2018-01-20 — End: 2018-01-20
  Administered 2018-01-20: 40 meq via ORAL
  Filled 2018-01-20: qty 2

## 2018-01-20 MED ORDER — POTASSIUM CHLORIDE IN NACL 20-0.9 MEQ/L-% IV SOLN
INTRAVENOUS | Status: DC
  Administered 2018-01-20 – 2018-01-21 (×2): via INTRAVENOUS
  Filled 2018-01-20 (×4): qty 1000

## 2018-01-20 NOTE — Progress Notes (Signed)
Pharmacy Electrolyte Monitoring Consult:  Pharmacy consulted to assist in monitoring and replacing electrolytes in this 72 y.o. male with a h/o CAD admitted on 01/19/2018 with diverticulitis.   Labs:  Sodium (mmol/L)  Date Value  01/20/2018 126 (L)  12/12/2015 139  09/07/2012 136   Potassium (mmol/L)  Date Value  01/20/2018 3.2 (L)  09/07/2012 3.8   Magnesium (mg/dL)  Date Value  01/20/2018 1.4 (L)   Calcium (mg/dL)  Date Value  01/20/2018 8.2 (L)   Calcium, Total (mg/dL)  Date Value  09/07/2012 8.7   Albumin (g/dL)  Date Value  01/19/2018 3.9  12/11/2017 4.0  09/07/2012 3.5    Assessment/Plan: Magnesium sulfate 4 g iv once and KCl 40 meq po once. After discussion with Dr. Margaretmary Eddy, will resume IVF with NS w/ 20 meq KCl at 75 ml/hr. Will recheck BMET at 1800.   Ulice Dash D 01/20/2018 9:49 AM

## 2018-01-20 NOTE — Progress Notes (Signed)
[place] at [place] NAME: Jonathan Escobar    MR#:  [id number]  DATE OF BIRTH:  05-Nov-1945  SUBJECTIVE:  CHIEF COMPLAINT: Abdominal pain is better.  Denies any blood in his stool.  Denies any nausea.  REVIEW OF SYSTEMS:  CONSTITUTIONAL: No fever, fatigue or weakness.  EYES: No blurred or double vision.  EARS, NOSE, AND THROAT: No tinnitus or ear pain.  RESPIRATORY: No cough, shortness of breath, wheezing or hemoptysis.  CARDIOVASCULAR: No chest pain, orthopnea, edema.  GASTROINTESTINAL: No nausea, vomiting, diarrhea .  Reporting lower abdominal pain 5 out of 5 GENITOURINARY: No dysuria, hematuria.  ENDOCRINE: No polyuria, nocturia,  HEMATOLOGY: No anemia, easy bruising or bleeding SKIN: No rash or lesion. MUSCULOSKELETAL: No joint pain or arthritis.   NEUROLOGIC: No tingling, numbness, weakness.  PSYCHIATRY: No anxiety or depression.   DRUG ALLERGIES:   Allergies  Allergen Reactions  . Norvasc [Amlodipine Besylate]     Dizzy  . Ace Inhibitors Other (See Comments)    Cough   . Amoxicillin Rash    Flu like symptoms    VITALS:  Blood pressure 106/66, pulse 71, temperature 98.7 F (37.1 C), temperature source Oral, resp. rate 17, SpO2 97 %.  PHYSICAL EXAMINATION:  GENERAL:  72 y.o.-year-old patient lying in the bed with no acute distress.  EYES: Pupils equal, round, reactive to light and accommodation. No scleral icterus. Extraocular muscles intact.  HEENT: Head atraumatic, normocephalic. Oropharynx and nasopharynx clear.  NECK:  Supple, no jugular venous distention. No thyroid enlargement, no tenderness.  LUNGS: Normal breath sounds bilaterally, no wheezing, rales,rhonchi or crepitation. No use of accessory muscles of respiration.  CARDIOVASCULAR: S1, S2 normal. No murmurs, rubs, or gallops.  ABDOMEN: Soft, lower abdominal tenderness present no rebound tenderness nontender, nondistended. Bowel sounds present.  EXTREMITIES:  No pedal edema, cyanosis, or clubbing.  NEUROLOGIC: Cranial nerves II through XII are intact. Muscle strength 5/5 in all extremities. Sensation intact. Gait not checked.  PSYCHIATRIC: The patient is alert and oriented x 3.  SKIN: No obvious rash, lesion, or ulcer.    LABORATORY PANEL:   CBC Recent Labs  Lab 01/20/18 0503  WBC 15.4*  HGB 13.4  HCT 38.3*  PLT 197   ------------------------------------------------------------------------------------------------------------------  Chemistries  Recent Labs  Lab 01/19/18 1907 01/20/18 0503  NA 128* 126*  K 3.8 3.2*  CL 94* 94*  CO2 24 21*  GLUCOSE 115* 127*  BUN 14 10  CREATININE 0.93 0.71  CALCIUM 8.7* 8.2*  MG  --  1.4*  AST 17  --   ALT 17  --   ALKPHOS 58  --   BILITOT 1.2  --    ------------------------------------------------------------------------------------------------------------------  Cardiac Enzymes No results for input(s): TROPONINI in the last 168 hours. ------------------------------------------------------------------------------------------------------------------  RADIOLOGY:  Ct Abdomen Pelvis W Contrast  Result Date: 01/19/2018 CLINICAL DATA:  72 year old male with a history of left lower quadrant pain EXAM: CT ABDOMEN AND PELVIS WITH CONTRAST TECHNIQUE: Multidetector CT imaging of the abdomen and pelvis was performed using the standard protocol following bolus administration of intravenous contrast. CONTRAST:  13mL ISOVUE-300 IOPAMIDOL (ISOVUE-300) INJECTION 61% COMPARISON:  None. FINDINGS: Lower chest: No acute abnormality. Surgical changes of median sternotomy and CABG Hepatobiliary: There are 2 small hypodense/hypoenhancing foci within the right liver measuring 6 mm, compatible with benign cysts. Unremarkable gallbladder Pancreas: Unremarkable pancreas Spleen: Unremarkable spleen Adrenals/Urinary Tract: Unremarkable adrenal glands. Right kidney without hydronephrosis or nephrolithiasis. No  perinephric stranding. Hypodense region in  the posterior cortex of the right kidney is too small to characterized, though likely benign. Left kidney without hydronephrosis or nephrolithiasis. No perinephric stranding. The proximal left ureter traverses the inflammatory changes of the left abdomen. Stomach/Bowel: Unremarkable stomach. Unremarkable small bowel. No abnormal distention. No small bowel or transition. No inflammatory changes adjacent to small bowel loops. Normal appendix. Decompressed proximal colon. No significant stool burden. Significant inflammatory changes of the sigmoid colon associated with diverticular change. No extraluminal air or focal fluid collection. Thickening of the fascial planes with local edema extending to the superior left urinary bladder. The urinary bladder is thickened at this site. Vascular/Lymphatic: Atherosclerotic changes of the abdominal aorta. Mesenteric arteries are patent. Bilateral renal arteries are patent. Bilateral iliac arteries and proximal femoral arteries are patent. Reproductive: Transverse diameter of the prostate measures 5.7 cm. Other: Bilateral fat containing inguinal hernia. Fat containing umbilical hernia. Musculoskeletal: No acute displaced fracture. Multilevel degenerative changes. No bony canal narrowing. IMPRESSION: Acute diverticulitis of the sigmoid colon. Negative for abscess or free intraperitoneal air. Focal wall thickening of the superior left urinary bladder, likely reactive given the adjacent diverticulitis. Aortic Atherosclerosis (ICD10-I70.0). These results were called by telephone at the time of interpretation on 01/19/2018 at 8:52 pm to Dr. Thersa Salt , who verbally acknowledged these results. Electronically Signed   By: Corrie Mckusick D.O.   On: 01/19/2018 20:53    EKG:   Orders placed or performed in visit on 12/11/17  . EKG 12-Lead    ASSESSMENT AND PLAN:   Diverticulitis -clinically improving.  Continue antibiotic ciprofloxacin  and Flagyl and as needed pain medications.  Supportive treatment with antiemetics and IV fluids   Hypomagnesemia, hyponatremia and hypokalemia replete electrodes and recheck in a.m.    CAD (coronary artery disease), autologous vein bypass graft --> full metal jacket PCI of SVG-RCA; PCI of SVG-D1 anastomosis -continue home meds aspirin, statin, metoprolol    Essential hypertension -home dose antihypertensives continue metoprolol    Hyperlipidemia LDL goal <70 - continue statin    GERD (gastroesophageal reflux disease) -Home dose PPI       All the records are reviewed and case discussed with Care Management/Social Workerr. Management plans discussed with the patient, family and they are in agreement.  CODE STATUS: FC   TOTAL TIME TAKING CARE OF THIS PATIENT: 33  minutes.   POSSIBLE D/C IN 1-2  DAYS, DEPENDING ON CLINICAL CONDITION.  Note: This dictation was prepared with Dragon dictation along with smaller phrase technology. Any transcriptional errors that result from this process are unintentional.   Nicholes Mango M.D on 01/20/2018 at 3:15 PM  Between 7am to 6pm - Pager - [phone] After 6pm go to [email or web address] - password [email or web address]  [place] [facility]  Office  [phone]  CC: Primary care physician; Glean Hess, MD

## 2018-01-21 DIAGNOSIS — I10 Essential (primary) hypertension: Secondary | ICD-10-CM | POA: Diagnosis not present

## 2018-01-21 DIAGNOSIS — Z955 Presence of coronary angioplasty implant and graft: Secondary | ICD-10-CM | POA: Diagnosis not present

## 2018-01-21 DIAGNOSIS — E876 Hypokalemia: Secondary | ICD-10-CM | POA: Diagnosis not present

## 2018-01-21 DIAGNOSIS — K5792 Diverticulitis of intestine, part unspecified, without perforation or abscess without bleeding: Secondary | ICD-10-CM | POA: Diagnosis not present

## 2018-01-21 DIAGNOSIS — Z951 Presence of aortocoronary bypass graft: Secondary | ICD-10-CM | POA: Diagnosis not present

## 2018-01-21 DIAGNOSIS — K5732 Diverticulitis of large intestine without perforation or abscess without bleeding: Secondary | ICD-10-CM | POA: Diagnosis not present

## 2018-01-21 DIAGNOSIS — E871 Hypo-osmolality and hyponatremia: Secondary | ICD-10-CM | POA: Diagnosis not present

## 2018-01-21 DIAGNOSIS — K219 Gastro-esophageal reflux disease without esophagitis: Secondary | ICD-10-CM | POA: Diagnosis not present

## 2018-01-21 DIAGNOSIS — E785 Hyperlipidemia, unspecified: Secondary | ICD-10-CM | POA: Diagnosis not present

## 2018-01-21 DIAGNOSIS — I2510 Atherosclerotic heart disease of native coronary artery without angina pectoris: Secondary | ICD-10-CM | POA: Diagnosis not present

## 2018-01-21 LAB — CBC
HEMATOCRIT: 35 % — AB (ref 40.0–52.0)
HEMOGLOBIN: 12.3 g/dL — AB (ref 13.0–18.0)
MCH: 31.8 pg (ref 26.0–34.0)
MCHC: 35.1 g/dL (ref 32.0–36.0)
MCV: 90.6 fL (ref 80.0–100.0)
Platelets: 188 10*3/uL (ref 150–440)
RBC: 3.86 MIL/uL — ABNORMAL LOW (ref 4.40–5.90)
RDW: 13.2 % (ref 11.5–14.5)
WBC: 9.9 10*3/uL (ref 3.8–10.6)

## 2018-01-21 LAB — OSMOLALITY, URINE: Osmolality, Ur: 223 mOsm/kg — ABNORMAL LOW (ref 300–900)

## 2018-01-21 LAB — BASIC METABOLIC PANEL
Anion gap: 6 (ref 5–15)
BUN: 10 mg/dL (ref 6–20)
CHLORIDE: 99 mmol/L — AB (ref 101–111)
CO2: 26 mmol/L (ref 22–32)
CREATININE: 0.87 mg/dL (ref 0.61–1.24)
Calcium: 8.1 mg/dL — ABNORMAL LOW (ref 8.9–10.3)
GFR calc Af Amer: >60 mL/min (ref >60)
GFR calc non Af Amer: >60 mL/min (ref >60)
Glucose, Bld: 103 mg/dL — ABNORMAL HIGH (ref 65–99)
POTASSIUM: 4.1 mmol/L (ref 3.5–5.1)
SODIUM: 131 mmol/L — AB (ref 135–145)

## 2018-01-21 LAB — MAGNESIUM: MAGNESIUM: 2.2 mg/dL (ref 1.7–2.4)

## 2018-01-21 MED ORDER — CIPROFLOXACIN HCL 500 MG PO TABS: 500.0000 mg | ORAL_TABLET | Freq: Two times a day (BID) | ORAL | Status: DC

## 2018-01-21 MED ORDER — AMOXICILLIN-POT CLAVULANATE 875-125 MG PO TABS: 1.0000 | ORAL_TABLET | Freq: Two times a day (BID) | ORAL | 0 refills | Status: AC

## 2018-01-21 MED ORDER — METRONIDAZOLE 500 MG PO TABS
500.0000 mg | ORAL_TABLET | Freq: Three times a day (TID) | ORAL | Status: DC
  Filled 2018-01-21 (×2): qty 1

## 2018-01-21 NOTE — Progress Notes (Addendum)
PHARMACIST - PHYSICIAN COMMUNICATION DR:   Posey Pronto CONCERNING: Antibiotic IV to Oral Route Change Policy  RECOMMENDATION: This patient is receiving ciprofloxacin and metronidazole by the intravenous route.  Based on criteria approved by the Pharmacy and Therapeutics Committee, the antibiotic(s) is/are being converted to the equivalent oral dose form(s).   DESCRIPTION: These criteria include:  The patient is not neutropenic and does not exhibit a GI malabsorption state  The patient is eating (either orally or via tube) and/or has been taking other orally administered medications for a least 24 hours  The patient is improving clinically and has a Tmax < 100.5  If you have questions about this conversion, please contact the Pharmacy Department  []   [phone] )  [facility] [x]   [phone] )  [facility] []   [phone] )  [facility] []   [phone] )  [facility] []   [phone] )  [place], PharmD Clinical Pharmacist

## 2018-01-21 NOTE — Progress Notes (Signed)
Jonathan Escobar  A and O x 4. VSS. Pt tolerating diet well. No complaints of pain or nausea. IV removed intact, prescriptions given. Pt voiced understanding of discharge instructions with no further questions. Pt refused wheelchair and ambulated down in dependently.   Lynann Bologna MSN, RN-BC  Allergies as of 01/21/2018      Reactions   Norvasc [amlodipine Besylate]    Dizzy   Ace Inhibitors Other (See Comments)   Cough   Amoxicillin Rash   Flu like symptoms      Medication List    TAKE these medications   acetaminophen 325 MG tablet Commonly known as:  TYLENOL Take 2 tablets (650 mg total) by mouth every 4 (four) hours as needed for headache or mild pain.   amoxicillin-clavulanate 875-125 MG tablet Commonly known as:  AUGMENTIN Take 1 tablet by mouth 2 (two) times daily for 6 days.   aspirin 81 MG tablet Take 81 mg by mouth daily.   atorvastatin 10 MG tablet Commonly known as:  LIPITOR Take 1 tablet (10 mg total) by mouth daily.   cetirizine 10 MG tablet Commonly known as:  ZYRTEC Take 10 mg by mouth daily as needed for allergies. PM   clopidogrel 75 MG tablet Commonly known as:  PLAVIX Take 1 tablet (75 mg total) by mouth daily with breakfast.   ezetimibe 10 MG tablet Commonly known as:  ZETIA Take 1 tablet (10 mg total) daily by mouth.   metoprolol succinate 50 MG 24 hr tablet Commonly known as:  TOPROL-XL Take 1 tablet (50 mg total) by mouth daily. Take with or immediately following a meal.   NITROSTAT 0.4 MG SL tablet Generic drug:  nitroGLYCERIN PLACE 1 TABLET UNDER THE TONGUE EVERY 5 MINUTES X 3 DOSES AS NEEDED FOR CHEST PAIN.   pantoprazole 40 MG tablet Commonly known as:  PROTONIX Take 1 tablet (40 mg total) by mouth daily at 6 (six) AM. What changed:  additional instructions   traZODone 50 MG tablet Commonly known as:  DESYREL Take 50 mg by mouth at bedtime.   valsartan 320 MG tablet Commonly known as:  DIOVAN Take 1 tablet (320 mg total) by  mouth daily. PM   vitamin C 1000 MG tablet Take 1,000 mg by mouth daily.       Vitals:   01/21/18 0601 01/21/18 1011  BP: 97/62 108/73  Pulse: 78 80  Resp: 16   Temp: 97.9 F (36.6 C)   SpO2: 96%

## 2018-01-21 NOTE — Discharge Summary (Signed)
[name], 72 y.o., DOB Jul 05, 1946, MRN [id number]. 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 male who presents with abdominal pain for the past 2 days, associated with nausea.  Patient was seen in urgent care and found to have an elevated white blood cell count.  Patient also had a CT scan which showed diverticulitis.  He had other electrolyte abnormalities therefore we were asked to admit him directly from the urgent care.  Patient was treated with IV Cipro and Flagyl.  He developed diarrhea during the hospitalization.  Which was felt to be due to antibiotics.  Patient has listed has having penicillin as allergy however he states that he had a flu syndrome at the time when he took Augmentin.  He stated that he is doing much better and wanted to go home therefore he is started on Augmentin.  Patient is recommended to drink plenty of fluids.             Consults  None  Significant Tests:  See full reports for all details     Ct Abdomen Pelvis W Contrast  Result Date: 01/19/2018 CLINICAL DATA:  72 year old male with a history of left lower quadrant pain EXAM: CT ABDOMEN AND PELVIS WITH CONTRAST TECHNIQUE: Multidetector CT imaging of the abdomen and pelvis was performed using the standard protocol following bolus administration of intravenous contrast. CONTRAST:  166mL ISOVUE-300 IOPAMIDOL (ISOVUE-300) INJECTION 61% COMPARISON:  None.  FINDINGS: Lower chest: No acute abnormality. Surgical changes of median sternotomy and CABG Hepatobiliary: There are 2 small hypodense/hypoenhancing foci within the right liver measuring 6 mm, compatible with benign cysts. Unremarkable gallbladder Pancreas: Unremarkable pancreas Spleen: Unremarkable spleen Adrenals/Urinary Tract: Unremarkable adrenal glands. Right kidney without hydronephrosis or nephrolithiasis. No perinephric stranding. Hypodense region in the posterior cortex of the right kidney is too small to characterized, though likely benign. Left kidney without hydronephrosis or nephrolithiasis. No perinephric stranding. The proximal left ureter traverses the inflammatory changes of the left abdomen. Stomach/Bowel: Unremarkable stomach. Unremarkable small bowel. No abnormal distention. No small bowel or transition. No inflammatory changes adjacent to small bowel loops. Normal appendix. Decompressed proximal colon. No significant stool burden. Significant inflammatory changes of the sigmoid colon associated with diverticular change. No extraluminal air or focal fluid collection. Thickening of the fascial planes with local edema extending to the superior left urinary bladder. The urinary bladder is thickened at this site. Vascular/Lymphatic: Atherosclerotic changes of the abdominal aorta. Mesenteric arteries are patent. Bilateral renal arteries are patent. Bilateral iliac arteries and proximal femoral arteries are patent. Reproductive: Transverse diameter of the prostate measures 5.7 cm. Other: Bilateral fat containing inguinal hernia. Fat containing umbilical hernia. Musculoskeletal: No acute displaced fracture. Multilevel degenerative changes. No bony canal narrowing. IMPRESSION: Acute diverticulitis of the sigmoid colon. Negative for abscess or free intraperitoneal air. Focal wall thickening of the superior left urinary bladder, likely reactive given the adjacent diverticulitis. Aortic Atherosclerosis  (ICD10-I70.0). These results were called by telephone at the time of interpretation on 01/19/2018 at 8:52 pm to  Dr. Thersa Salt , who verbally acknowledged these results. Electronically Signed   By: Corrie Mckusick D.O.   On: 01/19/2018 20:53       Today   Subjective:   Jonathan Escobar doing much better had some diarrhea overnight but now improved  Objective:   Blood pressure 108/73, pulse 80, temperature 97.9 F (36.6 C), temperature source Oral, resp. rate 16, height 5\' 10"  (1.778 m), weight 102.7 kg (226 lb 6.6 oz), SpO2 96 %.  .  Intake/Output Summary (Last 24 hours) at 01/21/2018 1435 Last data filed at 01/21/2018 0705 Gross per 24 hour  Intake 2788 ml  Output 2150 ml  Net 638 ml    Exam VITAL SIGNS: Blood pressure 108/73, pulse 80, temperature 97.9 F (36.6 C), temperature source Oral, resp. rate 16, height 5\' 10"  (1.778 m), weight 102.7 kg (226 lb 6.6 oz), SpO2 96 %.  GENERAL:  72 y.o.-year-old patient lying in the bed with no acute distress.  EYES: Pupils equal, round, reactive to light and accommodation. No scleral icterus. Extraocular muscles intact.  HEENT: Head atraumatic, normocephalic. Oropharynx and nasopharynx clear.  NECK:  Supple, no jugular venous distention. No thyroid enlargement, no tenderness.  LUNGS: Normal breath sounds bilaterally, no wheezing, rales,rhonchi or crepitation. No use of accessory muscles of respiration.  CARDIOVASCULAR: S1, S2 normal. No murmurs, rubs, or gallops.  ABDOMEN: Soft, nontender, nondistended. Bowel sounds present. No organomegaly or mass.  EXTREMITIES: No pedal edema, cyanosis, or clubbing.  NEUROLOGIC: Cranial nerves II through XII are intact. Muscle strength 5/5 in all extremities. Sensation intact. Gait not checked.  PSYCHIATRIC: The patient is alert and oriented x 3.  SKIN: No obvious rash, lesion, or ulcer.   Data Review     CBC w Diff:  Lab Results  Component Value Date   WBC 9.9 01/21/2018   HGB 12.3 (L) 01/21/2018    HGB 13.6 12/12/2015   HCT 35.0 (L) 01/21/2018   HCT 39.9 12/12/2015   PLT 188 01/21/2018   PLT 230 12/12/2015   LYMPHOPCT 7 01/19/2018   LYMPHOPCT 16.5 09/07/2012   MONOPCT 13 01/19/2018   MONOPCT 16.7 09/07/2012   EOSPCT 0 01/19/2018   EOSPCT 0.7 09/07/2012   BASOPCT 0 01/19/2018   BASOPCT 0.5 09/07/2012   CMP:  Lab Results  Component Value Date   NA 131 (L) 01/21/2018   NA 139 12/12/2015   NA 136 09/07/2012   K 4.1 01/21/2018   K 3.8 09/07/2012   CL 99 (L) 01/21/2018   CL 102 09/07/2012   CO2 26 01/21/2018   CO2 25 09/07/2012   BUN 10 01/21/2018   BUN 12 12/12/2015   BUN 12 09/07/2012   CREATININE 0.87 01/21/2018   CREATININE 0.86 09/07/2012   PROT 7.4 01/19/2018   PROT 6.7 12/11/2017   PROT 8.0 09/07/2012   ALBUMIN 3.9 01/19/2018   ALBUMIN 4.0 12/11/2017   ALBUMIN 3.5 09/07/2012   BILITOT 1.2 01/19/2018   BILITOT 0.5 12/11/2017   BILITOT 0.3 09/07/2012   ALKPHOS 58 01/19/2018   ALKPHOS 102 09/07/2012   AST 17 01/19/2018   AST 30 09/07/2012   ALT 17 01/19/2018   ALT 41 09/07/2012  .  Micro Results No results found for this or any previous visit (from the past 240 hour(s)).      Code Status Orders  (From admission, onward)        Start     Ordered   01/19/18 2259  Full code  Continuous  01/19/18 2258    Code Status History    Date Active Date Inactive Code Status Order ID Comments User Context   07/29/2013 1654 08/02/2013 1407 Full Code [id number]  Sherren Mocha, MD Inpatient   04/30/2012 1607 05/02/2012 1903 Full Code [id number]  Bubba Hales, RN Inpatient   04/28/2012 1444 04/30/2012 1607 Full Code [id number]  Sydnee Levans, RN Inpatient    Advance Directive Documentation     Most Recent Value  Type of Advance Directive  Healthcare Power of Attorney, Living will  Pre-existing out of facility DNR order (yellow form or pink MOST form)  -  "MOST" Form in Place?  -          Follow-up Information    Glean Hess, MD In 6 days.    Specialty:  Internal Medicine Why:  PLEASE CALL TO SCHEDULE Contact information: [place] [address] [phone]           Discharge Medications   Allergies as of 01/21/2018      Reactions   Norvasc [amlodipine Besylate]    Dizzy   Ace Inhibitors Other (See Comments)   Cough   Amoxicillin Rash   Flu like symptoms      Medication List    TAKE these medications   acetaminophen 325 MG tablet Commonly known as:  TYLENOL Take 2 tablets (650 mg total) by mouth every 4 (four) hours as needed for headache or mild pain.   amoxicillin-clavulanate 875-125 MG tablet Commonly known as:  AUGMENTIN Take 1 tablet by mouth 2 (two) times daily for 6 days.   aspirin 81 MG tablet Take 81 mg by mouth daily.   atorvastatin 10 MG tablet Commonly known as:  LIPITOR Take 1 tablet (10 mg total) by mouth daily.   cetirizine 10 MG tablet Commonly known as:  ZYRTEC Take 10 mg by mouth daily as needed for allergies. PM   clopidogrel 75 MG tablet Commonly known as:  PLAVIX Take 1 tablet (75 mg total) by mouth daily with breakfast.   ezetimibe 10 MG tablet Commonly known as:  ZETIA Take 1 tablet (10 mg total) daily by mouth.   metoprolol succinate 50 MG 24 hr tablet Commonly known as:  TOPROL-XL Take 1 tablet (50 mg total) by mouth daily. Take with or immediately following a meal.   NITROSTAT 0.4 MG SL tablet Generic drug:  nitroGLYCERIN PLACE 1 TABLET UNDER THE TONGUE EVERY 5 MINUTES X 3 DOSES AS NEEDED FOR CHEST PAIN.   pantoprazole 40 MG tablet Commonly known as:  PROTONIX Take 1 tablet (40 mg total) by mouth daily at 6 (six) AM. What changed:  additional instructions   traZODone 50 MG tablet Commonly known as:  DESYREL Take 50 mg by mouth at bedtime.   valsartan 320 MG tablet Commonly known as:  DIOVAN Take 1 tablet (320 mg total) by mouth daily. PM   vitamin C 1000 MG tablet Take 1,000 mg by mouth daily.          Total Time in preparing  paper work, data evaluation and todays exam - 23 minutes  Dustin Flock M.D on 01/21/2018 at 2:35 PM [place]  [phone]

## 2018-01-21 NOTE — Progress Notes (Signed)
Pharmacy Electrolyte Monitoring Consult:  Pharmacy consulted to assist in monitoring and replacing electrolytes in this 72 y.o. male with a h/o CAD admitted on 01/19/2018 with diverticulitis.   Labs:  Sodium (mmol/L)  Date Value  01/21/2018 131 (L)  12/12/2015 139  09/07/2012 136   Potassium (mmol/L)  Date Value  01/21/2018 4.1  09/07/2012 3.8   Magnesium (mg/dL)  Date Value  01/21/2018 2.2   Calcium (mg/dL)  Date Value  01/21/2018 8.1 (L)   Calcium, Total (mg/dL)  Date Value  09/07/2012 8.7   Albumin (g/dL)  Date Value  01/19/2018 3.9  12/11/2017 4.0  09/07/2012 3.5    Assessment/Plan: Electrolytes are WNL. Will f/u AM labs.   Ulice Dash D 01/21/2018 8:55 AM

## 2018-01-22 ENCOUNTER — Telehealth: Payer: Self-pay

## 2018-01-22 NOTE — Telephone Encounter (Signed)
TOC #1. Called pt to f/u after d/c from [facility] on 01/21/18. At the time of this entry, it appears pt is establishing as a new patient with [facility] Carles Collet, NP) on 02/01/18 and with [facility] (Dr. Ronnald Ramp) on 02/12/18. However, discharge summary indicates pt is to f/u with Dr. Army Melia within 6 days of discharge. It does not appear his hosp f/u appt has been scheduled. As part of this call, I am wanting to obtain clarification re: his PCP. Discharge planning includes the following:  - Start Augmentin - Remain hydrated - F/u with Dr. Army Melia in 6 days As part of the [facility] f/u call, I am also wanting to discuss and review the above information with the pt to ensure all of the above has been taken care of and to ensure he doesn't have any questions or concerns re: his care or discharge instructions. LVM requesting returned call.

## 2018-01-22 NOTE — Telephone Encounter (Signed)
He has been my patient in the past.  I do not know why Estill Bamberg put him with Dr. Ronnald Ramp.  He should be with me unless he wants to change to [facility] as his PCP.

## 2018-01-27 DIAGNOSIS — K5790 Diverticulosis of intestine, part unspecified, without perforation or abscess without bleeding: Secondary | ICD-10-CM | POA: Diagnosis not present

## 2018-02-01 ENCOUNTER — Ambulatory Visit: Payer: Self-pay | Admitting: Physician Assistant

## 2018-02-03 ENCOUNTER — Ambulatory Visit (INDEPENDENT_AMBULATORY_CARE_PROVIDER_SITE_OTHER): Payer: 59 | Admitting: Internal Medicine

## 2018-02-03 ENCOUNTER — Encounter: Payer: Self-pay | Admitting: Internal Medicine

## 2018-02-03 VITALS — BP 126/86 | HR 54 | Temp 98.2°F | Ht 67.0 in | Wt 214.0 lb

## 2018-02-03 DIAGNOSIS — M65312 Trigger thumb, left thumb: Secondary | ICD-10-CM | POA: Diagnosis not present

## 2018-02-03 DIAGNOSIS — E785 Hyperlipidemia, unspecified: Secondary | ICD-10-CM

## 2018-02-03 DIAGNOSIS — K219 Gastro-esophageal reflux disease without esophagitis: Secondary | ICD-10-CM | POA: Diagnosis not present

## 2018-02-03 DIAGNOSIS — I10 Essential (primary) hypertension: Secondary | ICD-10-CM | POA: Diagnosis not present

## 2018-02-03 DIAGNOSIS — Z01818 Encounter for other preprocedural examination: Secondary | ICD-10-CM | POA: Diagnosis not present

## 2018-02-03 DIAGNOSIS — Z0000 Encounter for general adult medical examination without abnormal findings: Secondary | ICD-10-CM

## 2018-02-03 DIAGNOSIS — I2510 Atherosclerotic heart disease of native coronary artery without angina pectoris: Secondary | ICD-10-CM

## 2018-02-03 DIAGNOSIS — Z7982 Long term (current) use of aspirin: Secondary | ICD-10-CM | POA: Diagnosis not present

## 2018-02-03 LAB — POCT URINALYSIS DIPSTICK
Bilirubin, UA: NEGATIVE
GLUCOSE UA: NEGATIVE
Ketones, UA: NEGATIVE
LEUKOCYTES UA: NEGATIVE
Nitrite, UA: NEGATIVE
Protein, UA: NEGATIVE
Spec Grav, UA: <=1.005 — AB (ref 1.010–1.025)
Urobilinogen, UA: 0.2 E.U./dL
pH, UA: 6.5 (ref 5.0–8.0)

## 2018-02-03 NOTE — Patient Instructions (Signed)
All immunization records  Colonoscopy report  Labs tests - esp Hep C screening, PSA

## 2018-02-03 NOTE — Progress Notes (Signed)
Date:  02/03/2018   Name:  Jonathan Escobar   DOB:  1946-05-09   MRN:  [id number]   Chief Complaint: Annual Exam Jonathan Escobar is a 72 y.o. male who presents today for his Complete Annual Exam. He feels fairly well - recovering from diverticulitis. He reports exercising some. He reports he is sleeping well. He goes to the [facility] for some care - colonoscopy, immunizations, etc.   Hypertension  This is a chronic problem. The problem is controlled. Pertinent negatives include no chest pain, headaches, palpitations or shortness of breath. Past treatments include angiotensin blockers and beta blockers.  Hyperlipidemia  This is a chronic problem. The problem is controlled. Pertinent negatives include no chest pain, myalgias or shortness of breath. Current antihyperlipidemic treatment includes statins.  Gastroesophageal Reflux  He complains of heartburn. He reports no abdominal pain, no chest pain, no choking or no wheezing. This is a recurrent problem. The problem occurs rarely. Pertinent negatives include no fatigue. He has tried a PPI for the symptoms. The treatment provided significant relief.  CAD - s/p multiple stents and MI.  Last intervention 08/2013.  Followed by Cardiology.  Diverticulitis - recently hospitalized with acute abdominal pain.  Admitted 5/21 - 5/23 due to low sodium and elevated WBC. Treated with Cipro and Flagyl. His last colonoscopy was about 2 years ago at the [facility].  He still has some diarrhea and will see GI Dr. Allen Norris tomorrow.  Lab Results  Component Value Date   WBC 9.9 01/21/2018   HGB 12.3 (L) 01/21/2018   HCT 35.0 (L) 01/21/2018   MCV 90.6 01/21/2018   PLT 188 01/21/2018   Lab Results  Component Value Date   CREATININE 0.87 01/21/2018   BUN 10 01/21/2018   NA 131 (L) 01/21/2018   K 4.1 01/21/2018   CL 99 (L) 01/21/2018   CO2 26 01/21/2018   Lab Results  Component Value Date   CHOL 126 12/11/2017   HDL 39 (L) 12/11/2017   LDLCALC 60 12/11/2017   TRIG  137 12/11/2017   CHOLHDL 3.2 12/11/2017     Review of Systems  Constitutional: Negative for appetite change, chills, diaphoresis, fatigue and unexpected weight change.  HENT: Negative for hearing loss, tinnitus, trouble swallowing and voice change.   Eyes: Negative for visual disturbance.  Respiratory: Negative for choking, shortness of breath and wheezing.   Cardiovascular: Negative for chest pain, palpitations and leg swelling.  Gastrointestinal: Positive for diarrhea and heartburn. Negative for abdominal pain, blood in stool and constipation.  Genitourinary: Negative for difficulty urinating, dysuria and frequency.  Musculoskeletal: Negative for arthralgias, back pain and myalgias.  Skin: Negative for color change and rash.  Neurological: Negative for dizziness, syncope and headaches.  Hematological: Negative for adenopathy.  Psychiatric/Behavioral: Negative for dysphoric mood and sleep disturbance.    Patient Active Problem List   Diagnosis Date Noted  . Diverticulitis 01/19/2018  . GERD (gastroesophageal reflux disease) 01/19/2018  . Non-recurrent unilateral inguinal hernia without obstruction or gangrene   . Right inguinal hernia 07/02/2016  . Personal history of other malignant neoplasm of skin 10/08/2015  . Essential hypertension 07/19/2014  . Fatigue 01/05/2014  . Heart palpitations 09/10/2013  . Obesity (BMI 30-39.9) 08/09/2013  . STEMI 07/29/13- successful complex full metal jacket stenting of the SVG-PDA/PLA -07/29/2013, staged SVG-Dx PCI 12/01 07/29/2013  . CAD (coronary artery disease), autologous vein bypass graft --> full metal jacket PCI of SVG-RCA; PCI of SVG-D1 anastomosis 07/02/2013  . Hyperlipidemia LDL goal <70   .  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 04/29/2012  . ST elevation myocardial infarction (STEMI) of inferior wall, initial episode of care - s/p PTCA of 95% ISR to 70% and 100% distal stent occlusion to ~40% (8/25) 04/25/2012  .  H/O: GI bleed - 2007 on Plavix 04/25/2012  . CAD in native artery - prior Inferior MI - RCA PCI; 04/2012 - Inferior STEMI - RCA occluded (unable to dilate lesion adequately) + LAD & D1 disease --> referred for CABG 04/07/2012    Prior to Admission medications   Medication Sig Start Date End Date Taking? Authorizing Provider  acetaminophen (TYLENOL) 325 MG tablet Take 2 tablets (650 mg total) by mouth every 4 (four) hours as needed for headache or mild pain. 08/02/13   Erlene Quan, PA-C  Ascorbic Acid (VITAMIN C) 1000 MG tablet Take 1,000 mg by mouth daily.    [provider]  aspirin 81 MG tablet Take 81 mg by mouth daily.    [provider]  atorvastatin (LIPITOR) 10 MG tablet Take 1 tablet (10 mg total) by mouth daily. 11/03/17 02/01/18  Wellington Hampshire, MD  cetirizine (ZYRTEC) 10 MG tablet Take 10 mg by mouth daily as needed for allergies. PM    [provider]  clopidogrel (PLAVIX) 75 MG tablet Take 1 tablet (75 mg total) by mouth daily with breakfast. 08/02/13   Erlene Quan, PA-C  ezetimibe (ZETIA) 10 MG tablet Take 1 tablet (10 mg total) daily by mouth. 07/20/17 01/20/18  Wellington Hampshire, MD  metoprolol succinate (TOPROL-XL) 50 MG 24 hr tablet Take 1 tablet (50 mg total) by mouth daily. Take with or immediately following a meal. 06/01/17   Arida, Mertie Clause, MD  NITROSTAT 0.4 MG SL tablet PLACE 1 TABLET UNDER THE TONGUE EVERY 5 MINUTES X 3 DOSES AS NEEDED FOR CHEST PAIN. 04/01/17   Leonie Man, MD  pantoprazole (PROTONIX) 40 MG tablet Take 1 tablet (40 mg total) by mouth daily at 6 (six) AM. Patient taking differently: Take 40 mg by mouth daily at 6 (six) AM. PM 08/02/13   Kilroy, Doreene Burke, PA-C  traZODone (DESYREL) 50 MG tablet Take 50 mg by mouth at bedtime.    [provider]  valsartan (DIOVAN) 320 MG tablet Take 1 tablet (320 mg total) by mouth daily. PM 06/01/17   Wellington Hampshire, MD    Allergies  Allergen Reactions  . Norvasc [Amlodipine  Besylate]     Dizzy  . Ace Inhibitors Other (See Comments)    Cough   . Amoxicillin Rash    Flu like symptoms    Past Surgical History:  Procedure Laterality Date  . CARDIAC CATHETERIZATION  04/28/12   RCA: 95% ISR followed by 100% stenosis post-stent with significant RPL lesions; LAD: Tandem 80 and 70% lesions involving D1 (1, 1, 1) --> initial angioplasty to RCA aspiration thrombectomy --> urgent CABG;  . CORONARY ARTERY BYPASS GRAFT  04/28/2012   Procedure: CORONARY ARTERY BYPASS GRAFTING (CABG);  Surgeon: Gaye Pollack, MD;  Location: [place];  Service: Open Heart Surgery;  Laterality: N/A;  . HERNIA REPAIR Left   . INGUINAL HERNIA REPAIR Right 08/06/2016   Procedure: HERNIA REPAIR INGUINAL ADULT;  Surgeon: Clayburn Pert, MD;  Location: [place];  Service: General;  Laterality: Right;  . LEFT AND RIGHT HEART CATHETERIZATION WITH CORONARY ANGIOGRAM N/A 04/25/2012   Procedure: LEFT AND RIGHT HEART CATHETERIZATION WITH CORONARY ANGIOGRAM;  Surgeon: Leonie Man, MD;  Location: [place] CATH LAB;  Service: Cardiovascular;  Laterality: N/A;  . LEFT HEART CATHETERIZATION WITH CORONARY/GRAFT ANGIOGRAM  07/29/2013   Procedure: LEFT HEART CATHETERIZATION WITH Beatrix Fetters;  Surgeon: Blane Ohara, MD;  Location: [facility] CATH LAB;  Service: Cardiovascular; ; 95% stenosis with percent mid occlusion of SVG-RPDA; 90% anastomotic SVG-diagonal, moderate 50% disease in the native circumflex with patent OM 1 OM 2. 80-90% proximal LAD with patent LIMA to LAD , EF 50%  . MOHS SURGERY     FOREHEAD  . NM MYOVIEW LTD  Jan 2015   Moderate Inferior infarct (as expected), but no ischemia & EF ~45-50%  . PERCUTANEOUS CORONARY STENT INTERVENTION (PCI-S)  07/29/2013   Procedure: PERCUTANEOUS CORONARY STENT INTERVENTION (PCI-S);  Surgeon: Blane Ohara, MD;  Location[facility] CATH LAB;  Service: Cardiovascular;; PCI to SVG-RPDA: Promus DES - 3 mm x 38 mm, 3 mm x 38 mm, 3 mm x 20 mm, 2.75 mm x 38 mm --  all postdilated to ~3.3 mm;  . PERCUTANEOUS CORONARY STENT INTERVENTION (PCI-S) N/A 08/01/2013   Procedure: PERCUTANEOUS CORONARY STENT INTERVENTION (PCI-S);  Surgeon: Leonie Man, MD;  Location: [facility] CATH LAB;  Service: Cardiovascular;staged PCI to distal SVG-diagonal: Xience Xpedition 2.5 mm x 20 mm (2.66 mm)    . TRANSTHORACIC ECHOCARDIOGRAM  12/'13; 07/30/2013   a) Post CABG: EF 50-55%, moderate HK of anteroseptal wall, septal dyskinesis/dyssynergy due to poststernotomy;  grade 1 diastolic dysfunction. Mildly dilated LA, mildly reduced RV function-likely due to septal dyssynergy.; b) 11/'14 Post Inferior STEMI: EF 50-55% mild LVH. Mild hypokinesis of the distal lateral, and basal to mid inferior lateral and inferior walls -- consistent with RCA infarct    Social History   Tobacco Use  . Smoking status: Former Smoker    Packs/day: 1.00    Years: 10.00    Pack years: 10.00    Types: Cigarettes    Last attempt to quit: 09/01/1976    Years since quitting: 41.4  . Smokeless tobacco: Never Used  Substance Use Topics  . Alcohol use: Yes    Alcohol/week: 1.2 oz    Types: 2 Shots of liquor per week    Comment: 1-2 shots of jack daniels a day.  . Drug use: No     Medication list has been reviewed and updated.  Current Meds  Medication Sig  . acetaminophen (TYLENOL) 325 MG tablet Take 2 tablets (650 mg total) by mouth every 4 (four) hours as needed for headache or mild pain.  . Ascorbic Acid (VITAMIN C) 1000 MG tablet Take 1,000 mg by mouth daily.  Marland Kitchen aspirin 81 MG tablet Take 81 mg by mouth daily.  Marland Kitchen atorvastatin (LIPITOR) 10 MG tablet Take 1 tablet (10 mg total) by mouth daily.  . cetirizine (ZYRTEC) 10 MG tablet Take 10 mg by mouth daily as needed for allergies. PM  . clopidogrel (PLAVIX) 75 MG tablet Take 1 tablet (75 mg total) by mouth daily with breakfast.  . ezetimibe (ZETIA) 10 MG tablet Take 1 tablet (10 mg total) daily by mouth.  . metoprolol succinate (TOPROL-XL) 50 MG 24 hr  tablet Take 1 tablet (50 mg total) by mouth daily. Take with or immediately following a meal.  . NITROSTAT 0.4 MG SL tablet PLACE 1 TABLET UNDER THE TONGUE EVERY 5 MINUTES X 3 DOSES AS NEEDED FOR CHEST PAIN.  Marland Kitchen pantoprazole (PROTONIX) 40 MG tablet Take 1 tablet (40 mg total) by mouth daily at 6 (six) AM. (Patient taking differently: Take 40  mg by mouth daily at 6 (six) AM. PM)  . traZODone (DESYREL) 50 MG tablet Take 50 mg by mouth at bedtime.  . valsartan (DIOVAN) 320 MG tablet Take 1 tablet (320 mg total) by mouth daily. PM    PHQ 2/9 Scores 02/03/2018  PHQ - 2 Score 0    Physical Exam  Constitutional: He is oriented to person, place, and time. He appears well-developed and well-nourished.  HENT:  Head: Normocephalic.  Right Ear: Tympanic membrane, external ear and ear canal normal.  Left Ear: Tympanic membrane, external ear and ear canal normal.  Nose: Nose normal.  Mouth/Throat: Uvula is midline and oropharynx is clear and moist.  Eyes: Pupils are equal, round, and reactive to light. Conjunctivae and EOM are normal.  Neck: Normal range of motion. Neck supple. Carotid bruit is not present. No thyromegaly present.  Cardiovascular: Normal rate, regular rhythm, normal heart sounds and intact distal pulses.  Pulmonary/Chest: Effort normal and breath sounds normal. He has no wheezes. Right breast exhibits no mass. Left breast exhibits no mass.  Abdominal: Soft. Normal appearance and bowel sounds are normal. There is no hepatosplenomegaly. There is no tenderness.  Musculoskeletal: Normal range of motion.  Lymphadenopathy:    He has no cervical adenopathy.  Neurological: He is alert and oriented to person, place, and time. He has normal reflexes.  Skin: Skin is warm, dry and intact.  Psychiatric: He has a normal mood and affect. His speech is normal and behavior is normal. Judgment and thought content normal.  Nursing note and vitals reviewed.   BP 126/86   Pulse (!) 54   Temp 98.2 F  (36.8 C) (Oral)   Ht 5\' 7"  (1.702 m)   Wt 214 lb (97.1 kg)   SpO2 98%   BMI 33.52 kg/m   Assessment and Plan: 1. Annual physical exam Resume regular exercise Pt will get immunization and colonoscopy records from [place]  2. Essential hypertension Controlled Low sodium in hospital - will recheck - POCT Urinalysis Dipstick - Basic metabolic panel  3. CAD in native artery Stable, on appropriate therapy  4. Hyperlipidemia LDL goal <70 controlled  5. Gastroesophageal reflux disease without esophagitis Stable on PPI See GI tomorrow about diverticulitis and diarrhea   No orders of the defined types were placed in this encounter.   Partially dictated using Editor, commissioning. Any errors are unintentional.  Halina Maidens, MD [facility]  02/03/2018

## 2018-02-04 ENCOUNTER — Ambulatory Visit: Payer: 59 | Admitting: Gastroenterology

## 2018-02-04 ENCOUNTER — Encounter: Payer: Self-pay | Admitting: Gastroenterology

## 2018-02-04 VITALS — BP 125/76 | HR 70 | Ht 67.0 in | Wt 220.0 lb

## 2018-02-04 DIAGNOSIS — R194 Change in bowel habit: Secondary | ICD-10-CM | POA: Diagnosis not present

## 2018-02-04 DIAGNOSIS — K5732 Diverticulitis of large intestine without perforation or abscess without bleeding: Secondary | ICD-10-CM | POA: Diagnosis not present

## 2018-02-04 LAB — BASIC METABOLIC PANEL
BUN/Creatinine Ratio: 10 (ref 10–24)
BUN: 9 mg/dL (ref 8–27)
CALCIUM: 9.4 mg/dL (ref 8.6–10.2)
CHLORIDE: 101 mmol/L (ref 96–106)
CO2: 22 mmol/L (ref 20–29)
Creatinine, Ser: 0.88 mg/dL (ref 0.76–1.27)
GFR calc Af Amer: 100 mL/min/{1.73_m2} (ref >59)
GFR calc non Af Amer: 86 mL/min/{1.73_m2} (ref >59)
GLUCOSE: 101 mg/dL — AB (ref 65–99)
Potassium: 4.5 mmol/L (ref 3.5–5.2)
Sodium: 138 mmol/L (ref 134–144)

## 2018-02-04 NOTE — Progress Notes (Signed)
Gastroenterology Consultation  Referring Provider:     Glean Hess, MD Primary Care Physician:  Glean Hess, MD Primary Gastroenterologist:  Dr. Allen Norris     Reason for Consultation:     Diarrhea        HPI:   Jonathan Escobar is a 72 y.o. y/o male referred for consultation & management of diarrhea by Dr. Army Melia, Jesse Sans, MD.  This patient comes in today with a history of heartburn and diarrhea.  The patient recently had diverticulitis and reports that he continues to have diarrhea.  The patient had a colonoscopy by a doctor at the [place] 2 years ago.  The patient appears to go to the [facility] for some of his medical care and elsewhere for other parts of his medical care.  The patient was discharged from the hospital on May 23rd for diverticulitis after having a CT scan showing him to have diverticulitis.  Patient also has a history of having a CABG.  The patient's CT scan showed diverticulitis in the sigmoid colon.  There is no abscess or free air seen.  The patient's white cell count had returned to normal by the time he was discharged from the hospital.  The patient reports that when he made the appointment for this office visit he was having diarrhea but now he states that the diarrhea has resolved with only one soft bowel movement per day.  The patient has no abdominal pain.  The patient was also concerned that he may need to have a colonoscopy since he had diverticulitis.  Denies any fevers chills nausea vomiting or abdominal pain at the present time.  Past Medical History:  Diagnosis Date  . CAD (coronary artery disease), autologous vein bypass graft Nov 2014   Inf STEMI - PCI to SVG-RPDA:   . CAD S/P percutaneous coronary angioplasty 1999   BMS to prox RCA 1999; Brachytherapy ~2003, followed by DES  PCI ([facility]) for ISR.  Marland Kitchen Coronary stent restenosis due to progression of disease 2000; 2005   (While Still In [place]) Status post Brachytherapy to RCA ISR - 2000; Redo PCI with  DES 2005  . Diverticulitis   . Essential hypertension    CONTROLLED ON MEDS  . GERD (gastroesophageal reflux disease)   . History of GI bleed     while on Plavix  . History of viral meningitis    following GI bleed   . History of: ST elevation myocardial infarction (STEMI) of inferior wall, subsequent episode of care April 25, 2012;   RCA: 95% ISR followed by 100% stenosis post-stent with significant RPL lesions; LAD: Tandem 80 and 70% lesions involving D1 (1, 1, 1) --> initial angioplasty to RCA aspiration thrombectomy --> urgent CABG;  . Hyperlipidemia LDL goal <70   . Obesity (BMI 30.0-34.9)   . S/P CABG x 4: LIMA-LAD, seq SVG-PD-PLA; SVG-diag. 04/28/2012  . S/P coronary artery stent placement Nov-Dec 2014   a) Inf STEMIL 11/28/'14 --> extensive (full metal Jacket) of SVG-RPDA Promus DES - 3 mm x 38 mm, 3 mm x 38 mm, 3 mm x 20 mm, 2.75 mm x 38 mm -- all postdilated to ~3.3 mm;; b) 12/1/'14: staged PCI to distal SVG-diagonal: Xience Xpedition 2.5 mm x 20 mm (2.66 mm)  . ST elevation myocardial infarction (STEMI) of inferior wall, subsequent episode of care [facility])  Jul 29, 2013   Diffuse near occlusion of SVG-rPDA, 90% SVG-Diag    Past Surgical History:  Procedure Laterality Date  .  CARDIAC CATHETERIZATION  04/28/12   RCA: 95% ISR followed by 100% stenosis post-stent with significant RPL lesions; LAD: Tandem 80 and 70% lesions involving D1 (1, 1, 1) --> initial angioplasty to RCA aspiration thrombectomy --> urgent CABG;  . CORONARY ARTERY BYPASS GRAFT  04/28/2012   Procedure: CORONARY ARTERY BYPASS GRAFTING (CABG);  Surgeon: Gaye Pollack, MD;  Location: [place];  Service: Open Heart Surgery;  Laterality: N/A;  . HERNIA REPAIR Left   . INGUINAL HERNIA REPAIR Right 08/06/2016   Procedure: HERNIA REPAIR INGUINAL ADULT;  Surgeon: Clayburn Pert, MD;  Location: [place];  Service: General;  Laterality: Right;  . LEFT AND RIGHT HEART CATHETERIZATION WITH CORONARY ANGIOGRAM N/A  04/25/2012   Procedure: LEFT AND RIGHT HEART CATHETERIZATION WITH CORONARY ANGIOGRAM;  Surgeon: Leonie Man, MD;  Location[facility] CATH LAB;  Service: Cardiovascular;  Laterality: N/A;  . LEFT HEART CATHETERIZATION WITH CORONARY/GRAFT ANGIOGRAM  07/29/2013   Procedure: LEFT HEART CATHETERIZATION WITH Beatrix Fetters;  Surgeon: Blane Ohara, MD;  [facility] C[facility] LAB;  Service: Cardiovascular; ; 95% stenosis with percent mid occlusion of SVG-RPDA; 90% anastomotic SVG-diagonal, moderate 50% disease in the native circumflex with patent OM 1 OM 2. 80-90% proximal LAD with patent LIMA to LAD , EF 50%  . MOHS SURGERY     FOREHEAD  . NM MYOVIEW LTD  Jan 2015   Moderate Inferior infarct (as expected), but no ischemia & EF ~45-50%  . PERCUTANEOUS CORONARY STENT INTERVENTION (PCI-S)  07/29/2013   Procedure: PERCUTANEOUS CORONARY STENT INTERVENTION (PCI-S);  Surgeon: Blane Ohara, MD;  Location: [facility] CATH LAB;  Service: Cardiovascular;; PCI to SVG-RPDA: Promus DES - 3 mm x 38 mm, 3 mm x 38 mm, 3 mm x 20 mm, 2.75 mm x 38 mm -- all postdilated to ~3.3 mm;  . PERCUTANEOUS CORONARY STENT INTERVENTION (PCI-S) N/A 08/01/2013   Procedure: PERCUTANEOUS CORONARY STENT INTERVENTION (PCI-S);  Surgeon: Leonie Man, MD;  Location: [facility] CATH LAB;  Service: Cardiovascular;staged PCI to distal SVG-diagonal: Xience Xpedition 2.5 mm x 20 mm (2.66 mm)    . TRANSTHORACIC ECHOCARDIOGRAM  12/'13; 07/30/2013   a) Post CABG: EF 50-55%, moderate HK of anteroseptal wall, septal dyskinesis/dyssynergy due to poststernotomy;  grade 1 diastolic dysfunction. Mildly dilated LA, mildly reduced RV function-likely due to septal dyssynergy.; b) 11/'14 Post Inferior STEMI: EF 50-55% mild LVH. Mild hypokinesis of the distal lateral, and basal to mid inferior lateral and inferior walls -- consistent with RCA infarct    Prior to Admission medications   Medication Sig Start Date End Date Taking? Authorizing Provider  acetaminophen  (TYLENOL) 325 MG tablet Take 2 tablets (650 mg total) by mouth every 4 (four) hours as needed for headache or mild pain. 08/02/13   Erlene Quan, PA-C  Ascorbic Acid (VITAMIN C) 1000 MG tablet Take 1,000 mg by mouth daily.    [provider]  aspirin 81 MG tablet Take 81 mg by mouth daily.    [provider]  atorvastatin (LIPITOR) 10 MG tablet Take 1 tablet (10 mg total) by mouth daily. 11/03/17 02/03/18  Wellington Hampshire, MD  cetirizine (ZYRTEC) 10 MG tablet Take 10 mg by mouth daily as needed for allergies. PM    [provider]  clopidogrel (PLAVIX) 75 MG tablet Take 1 tablet (75 mg total) by mouth daily with breakfast. 08/02/13   Erlene Quan, PA-C  ezetimibe (ZETIA) 10 MG tablet Take 1 tablet (10 mg total) daily by mouth. 07/20/17  02/03/18  Wellington Hampshire, MD  metoprolol succinate (TOPROL-XL) 50 MG 24 hr tablet Take 1 tablet (50 mg total) by mouth daily. Take with or immediately following a meal. 06/01/17   Arida, Mertie Clause, MD  NITROSTAT 0.4 MG SL tablet PLACE 1 TABLET UNDER THE TONGUE EVERY 5 MINUTES X 3 DOSES AS NEEDED FOR CHEST PAIN. 04/01/17   Leonie Man, MD  pantoprazole (PROTONIX) 40 MG tablet Take 1 tablet (40 mg total) by mouth daily at 6 (six) AM. Patient taking differently: Take 40 mg by mouth daily at 6 (six) AM. PM 08/02/13   Kilroy, Doreene Burke, PA-C  traZODone (DESYREL) 50 MG tablet Take 50 mg by mouth at bedtime.    [provider]  valsartan (DIOVAN) 320 MG tablet Take 1 tablet (320 mg total) by mouth daily. PM 06/01/17   Wellington Hampshire, MD    Family History  Problem Relation Age of Onset  . Alzheimer's disease Mother   . Heart failure Father   . Coronary artery disease Father   . Valvular heart disease Sister      Social History   Tobacco Use  . Smoking status: Former Smoker    Packs/day: 1.00    Years: 10.00    Pack years: 10.00    Types: Cigarettes    Last attempt to quit: 09/01/1976    Years since quitting: 41.4  .  Smokeless tobacco: Never Used  Substance Use Topics  . Alcohol use: Yes    Alcohol/week: 1.2 oz    Types: 2 Shots of liquor per week    Comment: 1-2 shots of jack daniels a day.  . Drug use: No    Allergies as of 02/04/2018 - Review Complete 02/03/2018  Allergen Reaction Noted  . Norvasc [amlodipine besylate]  08/02/2013  . Ace inhibitors Other (See Comments) 04/30/2012  . Amoxicillin Rash 04/25/2012    Review of Systems:    All systems reviewed and negative except where noted in HPI.   Physical Exam:  There were no vitals taken for this visit. No LMP for male patient. Psych:  Alert and cooperative. Normal mood and affect. General:   Alert,  Well-developed, well-nourished, pleasant and cooperative in NAD Head:  Normocephalic and atraumatic. Eyes:  Sclera clear, no icterus.   Conjunctiva pink. Ears:  Normal auditory acuity. Nose:  No deformity, discharge, or lesions. Mouth:  No deformity or lesions,oropharynx pink & moist. Neck:  Supple; no masses or thyromegaly. Lungs:  Respirations even and unlabored.  Clear throughout to auscultation.   No wheezes, crackles, or rhonchi. No acute distress. Heart:  Regular rate and rhythm; no murmurs, clicks, rubs, or gallops. Abdomen:  Normal bowel sounds.  No bruits.  Soft, non-tender and non-distended without masses, hepatosplenomegaly or hernias noted.  No guarding or rebound tenderness.  Negative Carnett sign.   Rectal:  Deferred.  Msk:  Symmetrical without gross deformities.  Good, equal movement & strength bilaterally. Pulses:  Normal pulses noted. Extremities:  No clubbing or edema.  No cyanosis. Neurologic:  Alert and oriented x3;  grossly normal neurologically. Skin:  Intact without significant lesions or rashes.  No jaundice. Lymph Nodes:  No significant cervical adenopathy. Psych:  Alert and cooperative. Normal mood and affect.  Imaging Studies: Ct Abdomen Pelvis W Contrast  Result Date: 01/19/2018 CLINICAL DATA:  72 year old  male with a history of left lower quadrant pain EXAM: CT ABDOMEN AND PELVIS WITH CONTRAST TECHNIQUE: Multidetector CT imaging of the abdomen and pelvis was performed using  the standard protocol following bolus administration of intravenous contrast. CONTRAST:  164mL ISOVUE-300 IOPAMIDOL (ISOVUE-300) INJECTION 61% COMPARISON:  None. FINDINGS: Lower chest: No acute abnormality. Surgical changes of median sternotomy and CABG Hepatobiliary: There are 2 small hypodense/hypoenhancing foci within the right liver measuring 6 mm, compatible with benign cysts. Unremarkable gallbladder Pancreas: Unremarkable pancreas Spleen: Unremarkable spleen Adrenals/Urinary Tract: Unremarkable adrenal glands. Right kidney without hydronephrosis or nephrolithiasis. No perinephric stranding. Hypodense region in the posterior cortex of the right kidney is too small to characterized, though likely benign. Left kidney without hydronephrosis or nephrolithiasis. No perinephric stranding. The proximal left ureter traverses the inflammatory changes of the left abdomen. Stomach/Bowel: Unremarkable stomach. Unremarkable small bowel. No abnormal distention. No small bowel or transition. No inflammatory changes adjacent to small bowel loops. Normal appendix. Decompressed proximal colon. No significant stool burden. Significant inflammatory changes of the sigmoid colon associated with diverticular change. No extraluminal air or focal fluid collection. Thickening of the fascial planes with local edema extending to the superior left urinary bladder. The urinary bladder is thickened at this site. Vascular/Lymphatic: Atherosclerotic changes of the abdominal aorta. Mesenteric arteries are patent. Bilateral renal arteries are patent. Bilateral iliac arteries and proximal femoral arteries are patent. Reproductive: Transverse diameter of the prostate measures 5.7 cm. Other: Bilateral fat containing inguinal hernia. Fat containing umbilical hernia.  Musculoskeletal: No acute displaced fracture. Multilevel degenerative changes. No bony canal narrowing. IMPRESSION: Acute diverticulitis of the sigmoid colon. Negative for abscess or free intraperitoneal air. Focal wall thickening of the superior left urinary bladder, likely reactive given the adjacent diverticulitis. Aortic Atherosclerosis (ICD10-I70.0). These results were called by telephone at the time of interpretation on 01/19/2018 at 8:52 pm to Dr. Thersa Salt , who verbally acknowledged these results. Electronically Signed   By: Corrie Mckusick D.O.   On: 01/19/2018 20:53    Assessment and Plan:   Jonathan Escobar is a 72 y.o. y/o male who had a recent episode of diverticulitis.  The patient states that it was his first episode of diverticulitis.  There is no rectal bleeding or change in bowel habits except one soft bowel movement per day which is likely due to his change in his bowel flora from his diverticulitis and antibiotic use.  The patient has been given samples of VSL#3 and has told to try yogurt or Dannon Activa to help his loose bowel movements.  The patient is also been encouraged to try fiber once a day to see if that box up his stools if the others do not work.  The patient has been explained the plan and agrees with it.  The patient is also been told to check the pathology of his last colonoscopy to see if he needs another colonoscopy in 3 years if he had adenomatous polyps.  Lucilla Lame, MD. Marval Regal   Note: This dictation was prepared with Dragon dictation along with smaller phrase technology. Any transcriptional errors that result from this process are unintentional.

## 2018-02-08 ENCOUNTER — Encounter: Payer: Self-pay | Admitting: Cardiovascular Disease

## 2018-02-12 ENCOUNTER — Ambulatory Visit: Payer: 59 | Admitting: Family Medicine

## 2018-02-16 DIAGNOSIS — M65312 Trigger thumb, left thumb: Secondary | ICD-10-CM | POA: Diagnosis not present

## 2018-03-24 DIAGNOSIS — M25562 Pain in left knee: Secondary | ICD-10-CM | POA: Diagnosis not present

## 2018-04-12 ENCOUNTER — Other Ambulatory Visit: Payer: Self-pay | Admitting: Cardiovascular Disease

## 2018-04-14 ENCOUNTER — Telehealth: Payer: Self-pay | Admitting: Cardiovascular Disease

## 2018-04-14 MED ORDER — ATORVASTATIN CALCIUM 10 MG PO TABS: 10.0000 mg | ORAL_TABLET | Freq: Every day | ORAL | 3 refills | Status: DC

## 2018-04-14 MED ORDER — METOPROLOL SUCCINATE ER 50 MG PO TB24: 50.0000 mg | ORAL_TABLET | Freq: Every day | ORAL | 3 refills | Status: DC

## 2018-04-14 NOTE — Telephone Encounter (Signed)
°*  STAT* If patient is at the pharmacy, call can be transferred to refill team.   1. Which medications need to be refilled? (please list name of each medication and dose if kno metoprolol succinate 50 MG 1 tablet daily Atorvastatin (LIPITOR) 10 MG - 1 tablet daily  2. Which pharmacy/location (including street and city if local pharmacy) is medication to be sent to? [place]   3. Do they need a 30 day or 90 day supply? 90 day

## 2018-06-17 ENCOUNTER — Encounter: Payer: Self-pay | Admitting: Internal Medicine

## 2018-06-26 ENCOUNTER — Telehealth: Payer: Self-pay | Admitting: Family Medicine

## 2018-06-26 MED ORDER — AMOXICILLIN-POT CLAVULANATE 875-125 MG PO TABS: 1.0000 | ORAL_TABLET | Freq: Two times a day (BID) | ORAL | 0 refills | Status: DC

## 2018-06-26 NOTE — Telephone Encounter (Signed)
Sent to wrong pharmacy. Sending to [facility] [address].

## 2018-06-26 NOTE — Telephone Encounter (Signed)
Ongoing issue with tooth abscess. Sending in Augmentin.  [facility]

## 2018-07-14 DIAGNOSIS — R29898 Other symptoms and signs involving the musculoskeletal system: Secondary | ICD-10-CM | POA: Diagnosis not present

## 2018-07-14 DIAGNOSIS — L821 Other seborrheic keratosis: Secondary | ICD-10-CM | POA: Diagnosis not present

## 2018-07-14 DIAGNOSIS — L111 Transient acantholytic dermatosis [Grover]: Secondary | ICD-10-CM | POA: Diagnosis not present

## 2018-07-14 DIAGNOSIS — R21 Rash and other nonspecific skin eruption: Secondary | ICD-10-CM | POA: Diagnosis not present

## 2018-07-14 DIAGNOSIS — L309 Dermatitis, unspecified: Secondary | ICD-10-CM | POA: Diagnosis not present

## 2018-07-23 ENCOUNTER — Ambulatory Visit: Payer: 59 | Admitting: Cardiovascular Disease

## 2018-07-23 ENCOUNTER — Ambulatory Visit (INDEPENDENT_AMBULATORY_CARE_PROVIDER_SITE_OTHER): Payer: 59

## 2018-07-23 ENCOUNTER — Telehealth: Payer: Self-pay | Admitting: Cardiovascular Disease

## 2018-07-23 ENCOUNTER — Encounter: Payer: Self-pay | Admitting: Cardiovascular Disease

## 2018-07-23 VITALS — BP 144/80 | HR 59 | Ht 69.0 in | Wt 221.8 lb

## 2018-07-23 DIAGNOSIS — I2510 Atherosclerotic heart disease of native coronary artery without angina pectoris: Secondary | ICD-10-CM | POA: Diagnosis not present

## 2018-07-23 DIAGNOSIS — E785 Hyperlipidemia, unspecified: Secondary | ICD-10-CM

## 2018-07-23 DIAGNOSIS — I10 Essential (primary) hypertension: Secondary | ICD-10-CM

## 2018-07-23 DIAGNOSIS — R002 Palpitations: Secondary | ICD-10-CM

## 2018-07-23 NOTE — Progress Notes (Signed)
Cardiology Office Note   Date:  07/23/2018   ID:  Jonathan Escobar, Jonathan Escobar Mar 30, 1946, MRN [id number]  PCP:  Glean Hess, MD  Cardiologist:   Kathlyn Sacramento, MD   Chief Complaint  Patient presents with  . other    Pt. c/o elevated BP, arrhythmia and feeling lightheaded; symptoms started about two days ago. Meds reviewed by the pt. verbally.       History of Present Illness: Jonathan Escobar is a 72 y.o. male who presents for a follow up visit Regarding coronary artery disease.  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  Inferior STEMI 04/25/2012 - 95% ISR followed by 5% stenosis post stent with lesions in the PL and PDA. Also noted 70% mid LAD; after aspiration thrombectomy, balloon angioplasty performed and he was sent for urgent CABG after stabilization on Integrelin (LIMA-LAD, SVG-PDA and PLA, SVG-Diag)   Inferior STEMI in November 2014 with occluded SVG-PDA --> extensive PCI (full metal jacket) of SVG followed by staged PCI of anastomotic SVG-diagonal lesion. No cardiac events since then.   He had a treadmill nuclear stress test done at the [place] in 2017 which showed no evidence of ischemia with normal ejection fraction.  He was added to my schedule today due to palpitations that started recently.  He feels that he is having bigeminy.  He has been checking his blood pressure and noted his heart rate to be in the 50s and blood pressure has been elevated at home.  No chest pain or shortness of breath.  Past Medical History:  Diagnosis Date  . CAD (coronary artery disease), autologous vein bypass graft Nov 2014   Inf STEMI - PCI to SVG-RPDA:   . CAD S/P percutaneous coronary angioplasty 1999   BMS to prox RCA 1999; Brachytherapy ~2003, followed by DES  PCI (Taxus) for ISR.  Marland Kitchen Coronary stent  restenosis due to progression of disease 2000; 2005   (While Still In [place]) Status post Brachytherapy to RCA ISR - 2000; Redo PCI with DES 2005  . Diverticulitis   . Essential hypertension    CONTROLLED ON MEDS  . GERD (gastroesophageal reflux disease)   . History of GI bleed     while on Plavix  . History of viral meningitis    following GI bleed   . History of: ST elevation myocardial infarction (STEMI) of inferior wall, subsequent episode of care April 25, 2012;   RCA: 95% ISR followed by 100% stenosis post-stent with significant RPL lesions; LAD: Tandem 80 and 70% lesions involving D1 (1, 1, 1) --> initial angioplasty to RCA aspiration thrombectomy --> urgent CABG;  . Hyperlipidemia LDL goal <70   . Obesity (BMI 30.0-34.9)   . S/P CABG x 4: LIMA-LAD, seq SVG-PD-PLA; SVG-diag. 04/28/2012  . S/P coronary artery stent placement Nov-Dec 2014   a) Inf STEMIL 11/28/'14 --> extensive (full metal Jacket) of SVG-RPDA Promus DES - 3 mm x 38 mm, 3 mm x 38 mm, 3 mm x 20 mm, 2.75 mm x 38 mm -- all postdilated to ~3.3 mm;; b) 12/1/'14: staged PCI to distal SVG-diagonal: Xience Xpedition 2.5 mm x 20 mm (2.66 mm)  . ST elevation myocardial infarction (STEMI) of inferior wall, subsequent episode of care [facility])  Jul 29, 2013  Diffuse near occlusion of SVG-rPDA, 90% SVG-Diag    Past Surgical History:  Procedure Laterality Date  . CARDIAC CATHETERIZATION  04/28/12   RCA: 95% ISR followed by 100% stenosis post-stent with significant RPL lesions; LAD: Tandem 80 and 70% lesions involving D1 (1, 1, 1) --> initial angioplasty to RCA aspiration thrombectomy --> urgent CABG;  . CORONARY ARTERY BYPASS GRAFT  04/28/2012   Procedure: CORONARY ARTERY BYPASS GRAFTING (CABG);  Surgeon: Gaye Pollack, MD;  Location: [place];  Service: Open Heart Surgery;  Laterality: N/A;  . HERNIA REPAIR Left   . INGUINAL HERNIA REPAIR Right 08/06/2016   Procedure: HERNIA REPAIR INGUINAL ADULT;  Surgeon: Clayburn Pert, MD;   Location: [place];  Service: General;  Laterality: Right;  . LEFT AND RIGHT HEART CATHETERIZATION WITH CORONARY ANGIOGRAM N/A 04/25/2012   Procedure: LEFT AND RIGHT HEART CATHETERIZATION WITH CORONARY ANGIOGRAM;  Surgeon: Leonie Man, MD;  Location: [facility] C[facility] LAB;  Service: Cardiovascular;  Laterality: N/A;  . LEFT HEART CATHETERIZATION WITH CORONARY/GRAFT ANGIOGRAM  07/29/2013   Procedure: LEFT HEART CATHETERIZATION WITH Beatrix Fetters;  Surgeon: Blane Ohara, MD;  Location: [facility] CATH LAB;  Service: Cardiovascular; ; 95% stenosis with percent mid occlusion of SVG-RPDA; 90% anastomotic SVG-diagonal, moderate 50% disease in the native circumflex with patent OM 1 OM 2. 80-90% proximal LAD with patent LIMA to LAD , EF 50%  . MOHS SURGERY     FOREHEAD  . NM MYOVIEW LTD  Jan 2015   Moderate Inferior infarct (as expected), but no ischemia & EF ~45-50%  . PERCUTANEOUS CORONARY STENT INTERVENTION (PCI-S)  07/29/2013   Procedure: PERCUTANEOUS CORONARY STENT INTERVENTION (PCI-S);  Surgeon: Blane Ohara, MD;  Location: [facility] CATH LAB;  Service: Cardiovascular;; PCI to SVG-RPDA: Promus DES - 3 mm x 38 mm, 3 mm x 38 mm, 3 mm x 20 mm, 2.75 mm x 38 mm -- all postdilated to ~3.3 mm;  . PERCUTANEOUS CORONARY STENT INTERVENTION (PCI-S) N/A 08/01/2013   Procedure: PERCUTANEOUS CORONARY STENT INTERVENTION (PCI-S);  Surgeon: Leonie Man, MD;  Location: [facility] CATH LAB;  Service: Cardiovascular;staged PCI to distal SVG-diagonal: Xience Xpedition 2.5 mm x 20 mm (2.66 mm)    . TRANSTHORACIC ECHOCARDIOGRAM  12/'13; 07/30/2013   a) Post CABG: EF 50-55%, moderate HK of anteroseptal wall, septal dyskinesis/dyssynergy due to poststernotomy;  grade 1 diastolic dysfunction. Mildly dilated LA, mildly reduced RV function-likely due to septal dyssynergy.; b) 11/'14 Post Inferior STEMI: EF 50-55% mild LVH. Mild hypokinesis of the distal lateral, and basal to mid inferior lateral and inferior walls -- consistent  with RCA infarct     Current Outpatient Medications  Medication Sig Dispense Refill  . acetaminophen (TYLENOL) 325 MG tablet Take 2 tablets (650 mg total) by mouth every 4 (four) hours as needed for headache or mild pain.    . Ascorbic Acid (VITAMIN C) 1000 MG tablet Take 1,000 mg by mouth daily.    Marland Kitchen aspirin 81 MG tablet Take 81 mg by mouth daily.    Marland Kitchen atorvastatin (LIPITOR) 10 MG tablet Take 1 tablet (10 mg total) by mouth daily. 90 tablet 3  . cetirizine (ZYRTEC) 10 MG tablet Take 10 mg by mouth daily as needed for allergies. PM    . clopidogrel (PLAVIX) 75 MG tablet Take 1 tablet (75 mg total) by mouth daily with breakfast. 90 tablet 3  . ezetimibe (ZETIA) 10 MG tablet Take 1 tablet (10 mg total) daily by mouth. 90 tablet 3  . metoprolol  succinate (TOPROL-XL) 50 MG 24 hr tablet Take 1 tablet (50 mg total) by mouth daily. Take with or immediately following a meal. 90 tablet 3  . NITROSTAT 0.4 MG SL tablet PLACE 1 TABLET UNDER THE TONGUE EVERY 5 MINUTES X 3 DOSES AS NEEDED FOR CHEST PAIN. 25 tablet 3  . pantoprazole (PROTONIX) 40 MG tablet Take 1 tablet (40 mg total) by mouth daily at 6 (six) AM. (Patient taking differently: Take 40 mg by mouth daily at 6 (six) AM. PM) 90 tablet 3  . traZODone (DESYREL) 50 MG tablet Take 50 mg by mouth at bedtime.    . valsartan (DIOVAN) 320 MG tablet Take 1 tablet (320 mg total) by mouth daily. PM 90 tablet 3   No current facility-administered medications for this visit.     Allergies:   Norvasc [amlodipine besylate] and Ace inhibitors    Social History:  The patient  reports that he quit smoking about 41 years ago. His smoking use included cigarettes. He has a 10.00 pack-year smoking history. He has never used smokeless tobacco. He reports that he drinks about 2.0 standard drinks of alcohol per week. He reports that he does not use drugs.   Family History:  The patient's family history includes Alzheimer's disease in his mother; Coronary artery disease  in his father; Heart failure in his father; Valvular heart disease in his sister.    ROS:  Please see the history of present illness.   Otherwise, review of systems are positive for none.   All other systems are reviewed and negative.    PHYSICAL EXAM: VS:  BP (!) 144/80 (BP Location: Left Arm, Patient Position: Sitting, Cuff Size: Normal)   Pulse (!) 59   Ht 5\' 9"  (1.753 m)   Wt 221 lb 12 oz (100.6 kg)   BMI 32.75 kg/m  , BMI Body mass index is 32.75 kg/m. GEN: Well nourished, well developed, in no acute distress  HEENT: normal  Neck: no JVD,  or masses. Faint bilateral carotid bruits Cardiac: RRR; no murmurs, rubs, or gallops,no edema  Respiratory:  clear to auscultation bilaterally, normal work of breathing GI: soft, nontender, nondistended, + BS MS: no deformity or atrophy  Skin: warm and dry, no rash Neuro:  Strength and sensation are intact Psych: euthymic mood, full affect   EKG:  EKG is ordered today. The ekg ordered today demonstrates sinus bradycardia with PACs and left axis deviation  Recent Labs: 01/19/2018: ALT 17 01/21/2018: Hemoglobin 12.3; Magnesium 2.2; Platelets 188 02/03/2018: BUN 9; Creatinine, Ser 0.88; Potassium 4.5; Sodium 138    Lipid Panel    Component Value Date/Time   CHOL 126 12/11/2017 1048   CHOL 153 09/07/2012 1201   TRIG 137 12/11/2017 1048   TRIG 185 09/07/2012 1201   HDL 39 (L) 12/11/2017 1048   HDL 31 (L) 09/07/2012 1201   CHOLHDL 3.2 12/11/2017 1048   CHOLHDL 3.2 07/30/2013 0403   VLDL 14 07/30/2013 0403   VLDL 37 09/07/2012 1201   LDLCALC 60 12/11/2017 1048   LDLCALC 85 09/07/2012 1201      Wt Readings from Last 3 Encounters:  07/23/18 221 lb 12 oz (100.6 kg)  02/04/18 220 lb (99.8 kg)  02/03/18 214 lb (97.1 kg)      No flowsheet data found.    ASSESSMENT AND PLAN:  1.  Recent palpitations: Seems to be due to PACs.  Does have history of PVCs in the past that correlated with ischemic events.  Due to that,  I requested in  1 week outpatient Zio patch monitor.   2. Coronary artery disease involving bypass graft without angina: Continue indefinite dual antiplatelet therapy.  He has no anginal symptoms at the present time.  3. Hyperlipidemia: He is tolerating small dose of atorvastatin and Zetia.  Most recent lipid profile showed an LDL of 60.  3. Essential hypertension: Blood pressure is elevated.  I asked him to monitor blood pressure at home and if it remains elevated, we can consider adding small dose of amlodipine.  It is listed that he had dizziness with it in the past.  We can also consider switching metoprolol to carvedilol.   Disposition:   FU with me in 6 months  Signed,  Kathlyn Sacramento, MD  07/23/2018 1:48 PM    [place]

## 2018-07-23 NOTE — Telephone Encounter (Signed)
Patient c/o Palpitations:  High priority if patient c/o lightheadedness, shortness of breath, or chest pain  1) How long have you had palpitations/irregular HR/ Afib? Are you having the symptoms now? 2 days yes  2) Are you currently experiencing lightheadedness, SOB or CP?  Interim lightheadedness denies cp/ sob   3) Do you have a history of afib (atrial fibrillation) or irregular heart rhythm? yes  4) Have you checked your BP or HR? (document readings if available): 160's /90 's HR 49 usually 50's - 60's   5) Are you experiencing any other symptoms? Feels like bigeminy    Wants asap to check up on this added to same day appt Arida at 120

## 2018-07-23 NOTE — Patient Instructions (Signed)
Medication Instructions:  No changes  If you need a refill on your cardiac medications before your next appointment, please call your pharmacy.   Lab work: None ordered  Testing/Procedures: Your physician has recommended that you wear a 7 day Zio event monitor. Event monitors are medical devices that record the heart's electrical activity. Doctors most often [place] these monitors to diagnose arrhythmias. Arrhythmias are problems with the speed or rhythm of the heartbeat. The monitor is a small, portable device. You can wear one while you do your normal daily activities. This is usually used to diagnose what is causing palpitations/syncope (passing out).    Follow-Up: At [facility], you and your health needs are our priority.  As part of our continuing mission to provide you with exceptional heart care, we have created designated Provider Care Teams.  These Care Teams include your primary Cardiologist (physician) and Advanced Practice Providers (APPs -  Physician Assistants and Nurse Practitioners) who all work together to provide you with the care you need, when you need it. You will need a follow up appointment in 6 months.  Please call our office 2 months in advance to schedule this appointment.  You may see Dr. Fletcher Anon or one of the following Advanced Practice Providers on your designated Care Team:   Murray Hodgkins, NP Christell Faith, PA-C . Marrianne Mood, PA-C  Any Other Special Instructions Will Be Listed Below (If Applicable). Please keep a log of your blood pressure and heart rates and call in one week with the readings at [phone]

## 2018-07-23 NOTE — Telephone Encounter (Signed)
Patient scheduled to see Dr. Fletcher Anon today at 1:20 pm.

## 2018-08-02 DIAGNOSIS — K649 Unspecified hemorrhoids: Secondary | ICD-10-CM | POA: Diagnosis not present

## 2018-08-02 DIAGNOSIS — K5732 Diverticulitis of large intestine without perforation or abscess without bleeding: Secondary | ICD-10-CM | POA: Diagnosis not present

## 2018-08-06 DIAGNOSIS — R002 Palpitations: Secondary | ICD-10-CM | POA: Diagnosis not present

## 2018-09-10 DIAGNOSIS — Z719 Counseling, unspecified: Secondary | ICD-10-CM | POA: Diagnosis not present

## 2018-09-15 LAB — HM COLONOSCOPY

## 2018-09-21 ENCOUNTER — Telehealth: Payer: Self-pay | Admitting: *Deleted

## 2018-09-21 NOTE — Telephone Encounter (Signed)
   [facility] Pre-operative Risk Assessment    Request for surgical clearance:  1. What type of surgery is being performed? Colonoscopy   2. When is this surgery scheduled? 10/25/2018   3. What type of clearance is required (medical clearance vs. Pharmacy clearance to hold med vs. Both)? Both  4. Are there any medications that need to be held prior to surgery and how long? Plavix and aspirin (they are requesting holding Plavix for 7 days prior)  5. Practice name and name of physician performing surgery? Dr. Wille Celeste, [place]  6. What is your office phone number: [phone]   7.   What is your office fax number: [phone]  8.   Anesthesia type (None, local, MAC, general) ?   Ricci Barker 09/21/2018, 10:47 AM  _________________________________________________________________   (provider comments below)

## 2018-09-23 NOTE — Telephone Encounter (Signed)
Low risk. Hold plavix for only 5 days before given extensive stenting.

## 2018-09-24 NOTE — Telephone Encounter (Signed)
Clearance successfully faxed to [phone].   Patient has been notified via MyChart per his request.

## 2018-10-22 ENCOUNTER — Telehealth: Payer: Self-pay | Admitting: Family Medicine

## 2018-10-22 MED ORDER — AMOXICILLIN-POT CLAVULANATE 875-125 MG PO TABS: 1.0000 | ORAL_TABLET | Freq: Two times a day (BID) | ORAL | 0 refills | Status: DC

## 2018-10-22 NOTE — Telephone Encounter (Signed)
Rx sent for Augmentin for recurrent diverticulitis.   [facility]

## 2018-10-22 NOTE — Telephone Encounter (Signed)
Sent to wrong pharmacy. Resent.

## 2018-10-25 DIAGNOSIS — K635 Polyp of colon: Secondary | ICD-10-CM | POA: Diagnosis not present

## 2018-10-25 DIAGNOSIS — D122 Benign neoplasm of ascending colon: Secondary | ICD-10-CM | POA: Diagnosis not present

## 2018-10-25 DIAGNOSIS — K648 Other hemorrhoids: Secondary | ICD-10-CM | POA: Diagnosis not present

## 2018-10-25 DIAGNOSIS — K5730 Diverticulosis of large intestine without perforation or abscess without bleeding: Secondary | ICD-10-CM | POA: Diagnosis not present

## 2018-10-27 DIAGNOSIS — E785 Hyperlipidemia, unspecified: Secondary | ICD-10-CM | POA: Diagnosis not present

## 2018-10-27 DIAGNOSIS — I10 Essential (primary) hypertension: Secondary | ICD-10-CM | POA: Diagnosis not present

## 2018-11-18 ENCOUNTER — Encounter: Payer: Self-pay | Admitting: Internal Medicine

## 2018-11-18 NOTE — Telephone Encounter (Signed)
Patient response.  Benedict Needy, CMA

## 2018-11-18 NOTE — Telephone Encounter (Signed)
Please advise patient message regarding FMLA?

## 2018-12-09 ENCOUNTER — Emergency Department
Admission: EM | Admit: 2018-12-09 | Discharge: 2018-12-09 | Disposition: A | Discharge status: Home / Self Care | Payer: 59 | Attending: Emergency Medicine | Admitting: Emergency Medicine

## 2018-12-09 ENCOUNTER — Encounter: Payer: Self-pay | Admitting: Emergency Medicine

## 2018-12-09 ENCOUNTER — Other Ambulatory Visit: Payer: Self-pay

## 2018-12-09 ENCOUNTER — Emergency Department: Payer: 59

## 2018-12-09 DIAGNOSIS — Z87891 Personal history of nicotine dependence: Secondary | ICD-10-CM | POA: Insufficient documentation

## 2018-12-09 DIAGNOSIS — J029 Acute pharyngitis, unspecified: Secondary | ICD-10-CM | POA: Insufficient documentation

## 2018-12-09 DIAGNOSIS — I2510 Atherosclerotic heart disease of native coronary artery without angina pectoris: Secondary | ICD-10-CM | POA: Diagnosis not present

## 2018-12-09 DIAGNOSIS — Z79899 Other long term (current) drug therapy: Secondary | ICD-10-CM | POA: Insufficient documentation

## 2018-12-09 DIAGNOSIS — I10 Essential (primary) hypertension: Secondary | ICD-10-CM | POA: Insufficient documentation

## 2018-12-09 DIAGNOSIS — R509 Fever, unspecified: Secondary | ICD-10-CM | POA: Diagnosis not present

## 2018-12-09 DIAGNOSIS — R05 Cough: Secondary | ICD-10-CM | POA: Insufficient documentation

## 2018-12-09 DIAGNOSIS — Z7982 Long term (current) use of aspirin: Secondary | ICD-10-CM | POA: Diagnosis not present

## 2018-12-09 DIAGNOSIS — Z951 Presence of aortocoronary bypass graft: Secondary | ICD-10-CM | POA: Diagnosis not present

## 2018-12-09 DIAGNOSIS — Z7902 Long term (current) use of antithrombotics/antiplatelets: Secondary | ICD-10-CM | POA: Insufficient documentation

## 2018-12-09 DIAGNOSIS — R000 Tachycardia, unspecified: Secondary | ICD-10-CM | POA: Insufficient documentation

## 2018-12-09 HISTORY — DX: Supraventricular tachycardia, unspecified: I47.10

## 2018-12-09 HISTORY — DX: Supraventricular tachycardia: I47.1

## 2018-12-09 LAB — BASIC METABOLIC PANEL
Anion gap: 12 (ref 5–15)
BUN: 10 mg/dL (ref 8–23)
CO2: 25 mmol/L (ref 22–32)
Calcium: 8.7 mg/dL — ABNORMAL LOW (ref 8.9–10.3)
Chloride: 96 mmol/L — ABNORMAL LOW (ref 98–111)
Creatinine, Ser: 0.81 mg/dL (ref 0.61–1.24)
GFR calc Af Amer: >60 mL/min (ref >60)
GFR calc non Af Amer: >60 mL/min (ref >60)
Glucose, Bld: 98 mg/dL (ref 70–99)
Potassium: 3.4 mmol/L — ABNORMAL LOW (ref 3.5–5.1)
Sodium: 133 mmol/L — ABNORMAL LOW (ref 135–145)

## 2018-12-09 LAB — CBC
HCT: 39.2 % (ref 39.0–52.0)
Hemoglobin: 13.2 g/dL (ref 13.0–17.0)
MCH: 30.3 pg (ref 26.0–34.0)
MCHC: 33.7 g/dL (ref 30.0–36.0)
MCV: 90.1 fL (ref 80.0–100.0)
Platelets: 205 10*3/uL (ref 150–400)
RBC: 4.35 MIL/uL (ref 4.22–5.81)
RDW: 13 % (ref 11.5–15.5)
WBC: 10.6 10*3/uL — ABNORMAL HIGH (ref 4.0–10.5)
nRBC: 0 % (ref 0.0–0.2)

## 2018-12-09 MED ORDER — ACETAMINOPHEN 500 MG PO TABS
ORAL_TABLET | ORAL | Status: AC
  Filled 2018-12-09: qty 2

## 2018-12-09 MED ORDER — ACETAMINOPHEN 500 MG PO TABS: 1000.0000 mg | ORAL_TABLET | Freq: Once | ORAL | Status: DC

## 2018-12-09 NOTE — ED Triage Notes (Signed)
Says he was tested for covid at [facility] today for sore throat.  Went home and says went into svt which he has history of for about 10 minutes.  He says he just feels "uneasy" now.  No chest pain.

## 2018-12-09 NOTE — Discharge Instructions (Addendum)
Please seek medical attention for any high fevers, chest pain, shortness of breath, change in behavior, persistent vomiting, bloody stool or any other new or concerning symptoms.  

## 2018-12-09 NOTE — ED Provider Notes (Signed)
[facility] Emergency Department Provider Note    ____________________________________________   I have reviewed the triage vital signs and the nursing notes.   HISTORY  Chief Complaint SVT  History limited by: Not Limited   HPI Jonathan Escobar is a 73 y.o. male who presents to the emergency department today because of concern for episode of SVT that occurred today. Patient states he has a history of SVT although it has been a long time since he last had an episode. His heart rate went into the 130s. Tried two valsalva maneuvers and it broke after the second one.  He is on metoprolol for it and denies any recent medication changes. He states that for the past couple of days he has been having a sore throat and cough. Today he did develop a fever. Went to [place] [facility] and got a covid test performed.    Records reviewed. Per medical record review patient has a history of CAD.  Past Medical History:  Diagnosis Date  . CAD (coronary artery disease), autologous vein bypass graft Nov 2014   Inf STEMI - PCI to SVG-RPDA:   . CAD S/P percutaneous coronary angioplasty 1999   BMS to prox RCA 1999; Brachytherapy ~2003, followed by DES  PCI ([facility]) for ISR.  Marland Kitchen Coronary stent restenosis due to progression of disease 2000; 2005   (While Still In [place]) Status post Brachytherapy to RCA ISR - 2000; Redo PCI with DES 2005  . Diverticulitis   . Essential hypertension    CONTROLLED ON MEDS  . GERD (gastroesophageal reflux disease)   . History of GI bleed     while on Plavix  . History of viral meningitis    following GI bleed   . History of: ST elevation myocardial infarction (STEMI) of inferior wall, subsequent episode of care April 25, 2012;   RCA: 95% ISR followed by 100% stenosis post-stent with significant RPL lesions; LAD: Tandem 80 and 70% lesions involving D1 (1, 1, 1) --> initial angioplasty to RCA aspiration thrombectomy --> urgent CABG;  . Hyperlipidemia  LDL goal <70   . Obesity (BMI 30.0-34.9)   . S/P CABG x 4: LIMA-LAD, seq SVG-PD-PLA; SVG-diag. 04/28/2012  . S/P coronary artery stent placement Nov-Dec 2014   a) Inf STEMIL 11/28/'14 --> extensive (full metal Jacket) of SVG-RPDA Promus DES - 3 mm x 38 mm, 3 mm x 38 mm, 3 mm x 20 mm, 2.75 mm x 38 mm -- all postdilated to ~3.3 mm;; b) 12/1/'14: staged PCI to distal SVG-diagonal: Xience Xpedition 2.5 mm x 20 mm (2.66 mm)  . ST elevation myocardial infarction (STEMI) of inferior wall, subsequent episode of care [facility])  Jul 29, 2013   Diffuse near occlusion of SVG-rPDA, 90% SVG-Diag  . Supraventricular tachycardia [facility])     Patient Active Problem List   Diagnosis Date Noted  . Diverticulitis 01/19/2018  . GERD (gastroesophageal reflux disease) 01/19/2018  . Non-recurrent unilateral inguinal hernia without obstruction or gangrene   . Right inguinal hernia 07/02/2016  . Personal history of other malignant neoplasm of skin 10/08/2015  . Essential hypertension 07/19/2014  . Fatigue 01/05/2014  . Heart palpitations 09/10/2013  . Obesity (BMI 30-39.9) 08/09/2013  . STEMI 07/29/13- successful complex full metal jacket stenting of the SVG-PDA/PLA -07/29/2013, staged SVG-Dx PCI 12/01 07/29/2013  . CAD (coronary artery disease), autologous vein bypass graft --> full metal jacket PCI of SVG-RCA; PCI of SVG-D1 anastomosis 07/02/2013  . Hyperlipidemia LDL goal <70   . S/P  CABG x 4, 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 04/29/2012  . ST elevation myocardial infarction (STEMI) of inferior wall, initial episode of care - s/p PTCA of 95% ISR to 70% and 100% distal stent occlusion to ~40% (8/25) 04/25/2012  . H/O: GI bleed - 2007 on Plavix 04/25/2012  . CAD in native artery - prior Inferior MI - RCA PCI; 04/2012 - Inferior STEMI - RCA occluded (unable to dilate lesion adequately) + LAD & D1 disease --> referred for CABG 04/07/2012    Past Surgical History:  Procedure Laterality Date  . CARDIAC  CATHETERIZATION  04/28/12   RCA: 95% ISR followed by 100% stenosis post-stent with significant RPL lesions; LAD: Tandem 80 and 70% lesions involving D1 (1, 1, 1) --> initial angioplasty to RCA aspiration thrombectomy --> urgent CABG;  . CORONARY ARTERY BYPASS GRAFT  04/28/2012   Procedure: CORONARY ARTERY BYPASS GRAFTING (CABG);  Surgeon: Gaye Pollack, MD;  Location: [place];  Service: Open Heart Surgery;  Laterality: N/A;  . HERNIA REPAIR Left   . INGUINAL HERNIA REPAIR Right 08/06/2016   Procedure: HERNIA REPAIR INGUINAL ADULT;  Surgeon: Clayburn Pert, MD;  Location: [place];  Service: General;  Laterality: Right;  . LEFT AND RIGHT HEART CATHETERIZATION WITH CORONARY ANGIOGRAM N/A 04/25/2012   Procedure: LEFT AND RIGHT HEART CATHETERIZATION WITH CORONARY ANGIOGRAM;  Surgeon: Leonie Man, MD;  Location: [facility] CATH LAB;  Service: Cardiovascular;  Laterality: N/A;  . LEFT HEART CATHETERIZATION WITH CORONARY/GRAFT ANGIOGRAM  07/29/2013   Procedure: LEFT HEART CATHETERIZATION WITH Beatrix Fetters;  Surgeon: Blane Ohara, MD;  Location: [facility] CATH LAB;  Service: Cardiovascular; ; 95% stenosis with percent mid occlusion of SVG-RPDA; 90% anastomotic SVG-diagonal, moderate 50% disease in the native circumflex with patent OM 1 OM 2. 80-90% proximal LAD with patent LIMA to LAD , EF 50%  . MOHS SURGERY     FOREHEAD  . NM MYOVIEW LTD  Jan 2015   Moderate Inferior infarct (as expected), but no ischemia & EF ~45-50%  . PERCUTANEOUS CORONARY STENT INTERVENTION (PCI-S)  07/29/2013   Procedure: PERCUTANEOUS CORONARY STENT INTERVENTION (PCI-S);  Surgeon: Blane Ohara, MD;  Location: [facility] CATH LAB;  Service: Cardiovascular;; PCI to SVG-RPDA: Promus DES - 3 mm x 38 mm, 3 mm x 38 mm, 3 mm x 20 mm, 2.75 mm x 38 mm -- all postdilated to ~3.3 mm;  . PERCUTANEOUS CORONARY STENT INTERVENTION (PCI-S) N/A 08/01/2013   Procedure: PERCUTANEOUS CORONARY STENT INTERVENTION (PCI-S);  Surgeon: Leonie Man, MD;  Location: [facility] CATH LAB;  Service: Cardiovascular;staged PCI to distal SVG-diagonal: Xience Xpedition 2.5 mm x 20 mm (2.66 mm)    . TRANSTHORACIC ECHOCARDIOGRAM  12/'13; 07/30/2013   a) Post CABG: EF 50-55%, moderate HK of anteroseptal wall, septal dyskinesis/dyssynergy due to poststernotomy;  grade 1 diastolic dysfunction. Mildly dilated LA, mildly reduced RV function-likely due to septal dyssynergy.; b) 11/'14 Post Inferior STEMI: EF 50-55% mild LVH. Mild hypokinesis of the distal lateral, and basal to mid inferior lateral and inferior walls -- consistent with RCA infarct    Prior to Admission medications   Medication Sig Start Date End Date Taking? Authorizing Provider  acetaminophen (TYLENOL) 325 MG tablet Take 2 tablets (650 mg total) by mouth every 4 (four) hours as needed for headache or mild pain. 08/02/13   Erlene Quan, PA-C  amoxicillin-clavulanate (AUGMENTIN) 875-125 MG tablet Take 1 tablet by mouth every 12 (twelve) hours. 10/22/18   Coral Spikes, DO  amoxicillin-clavulanate (AUGMENTIN)  875-125 MG tablet Take 1 tablet by mouth every 12 (twelve) hours. 10/22/18   Coral Spikes, DO  Ascorbic Acid (VITAMIN C) 1000 MG tablet Take 1,000 mg by mouth daily.    [provider]  aspirin 81 MG tablet Take 81 mg by mouth daily.    [provider]  atorvastatin (LIPITOR) 10 MG tablet Take 1 tablet (10 mg total) by mouth daily. 04/14/18 07/23/18  Wellington Hampshire, MD  cetirizine (ZYRTEC) 10 MG tablet Take 10 mg by mouth daily as needed for allergies. PM    [provider]  clopidogrel (PLAVIX) 75 MG tablet Take 1 tablet (75 mg total) by mouth daily with breakfast. 08/02/13   Erlene Quan, PA-C  ezetimibe (ZETIA) 10 MG tablet Take 1 tablet (10 mg total) daily by mouth. 07/20/17 07/23/18  Wellington Hampshire, MD  metoprolol succinate (TOPROL-XL) 50 MG 24 hr tablet Take 1 tablet (50 mg total) by mouth daily. Take with or immediately following a meal. 04/14/18    Arida, Mertie Clause, MD  NITROSTAT 0.4 MG SL tablet PLACE 1 TABLET UNDER THE TONGUE EVERY 5 MINUTES X 3 DOSES AS NEEDED FOR CHEST PAIN. 04/01/17   Leonie Man, MD  pantoprazole (PROTONIX) 40 MG tablet Take 1 tablet (40 mg total) by mouth daily at 6 (six) AM. Patient taking differently: Take 40 mg by mouth daily at 6 (six) AM. PM 08/02/13   Kilroy, Doreene Burke, PA-C  traZODone (DESYREL) 50 MG tablet Take 50 mg by mouth at bedtime.    [provider]  valsartan (DIOVAN) 320 MG tablet Take 1 tablet (320 mg total) by mouth daily. PM 06/01/17   Wellington Hampshire, MD    Allergies Norvasc [amlodipine besylate] and Ace inhibitors  Family History  Problem Relation Age of Onset  . Alzheimer's disease Mother   . Heart failure Father   . Coronary artery disease Father   . Valvular heart disease Sister     Social History Social History   Tobacco Use  . Smoking status: Former Smoker    Packs/day: 1.00    Years: 10.00    Pack years: 10.00    Types: Cigarettes    Last attempt to quit: 09/01/1976    Years since quitting: 42.2  . Smokeless tobacco: Never Used  Substance Use Topics  . Alcohol use: Yes    Alcohol/week: 2.0 standard drinks    Types: 2 Shots of liquor per week    Comment: 1-2 shots of jack daniels a day.  . Drug use: No    Review of Systems Constitutional: Positive for fever. Eyes: No visual changes. ENT: Positive for sore throat.  Cardiovascular: Denies chest pain. Positive for fast heart rate.  Respiratory: Denies shortness of breath. Positive for cough.  Gastrointestinal: No abdominal pain.  No nausea, no vomiting.  No diarrhea.   Genitourinary: Negative for dysuria. Musculoskeletal: Negative for back pain. Skin: Negative for rash. Neurological: Negative for headaches, focal weakness or numbness.  ____________________________________________   PHYSICAL EXAM:  VITAL SIGNS: ED Triage Vitals [12/09/18 1315]  Enc Vitals Group     BP      Pulse      Resp       Temp      Temp src      SpO2      Weight 214 lb (97.1 kg)     Height 5\' 9"  (1.753 m)     Head Circumference      Peak Flow  Pain Score 0    Constitutional: Alert and oriented.  Eyes: Conjunctivae are normal.  ENT      Head: Normocephalic and atraumatic.      Nose: No congestion/rhinnorhea.      Mouth/Throat: Mucous membranes are moist.      Neck: No stridor. Hematological/Lymphatic/Immunilogical: No cervical lymphadenopathy. Cardiovascular: Normal rate, regular rhythm.  Respiratory: Normal respiratory effort without tachypnea nor retractions. Genitourinary: Deferred Musculoskeletal: Normal range of motion in all extremities. No lower extremity edema. Neurologic:  Normal speech and language. No gross focal neurologic deficits are appreciated.  Skin:  Skin is warm, dry and intact. No rash noted. Psychiatric: Mood and affect are normal. Speech and behavior are normal. Patient exhibits appropriate insight and judgment.  ____________________________________________    LABS (pertinent positives/negatives)  CBC wbc 10.6, hgb 13.2, plt 205 BMP na 133, k 3.4, glu 98, cr 0.81  ____________________________________________   EKG  I, Nance Pear, attending physician, personally viewed and interpreted this EKG  EKG Time: 1321 Rate: 100 Rhythm: normal sinus rhythm Axis: left axis deviation Intervals: qtc 451 QRS: LAFB ST changes: no st elevation Impression: abnormal ekg   ____________________________________________    RADIOLOGY  CXR No acute finding  ____________________________________________   PROCEDURES  Procedures  ____________________________________________   INITIAL IMPRESSION / ASSESSMENT AND PLAN / ED COURSE  Pertinent labs & imaging results that were available during my care of the patient were reviewed by me and considered in my medical decision making (see chart for details).   Patient presented to the emergency department because of  concern for an episode of SVT. Patient was seen earlier today at [facility] and had COVID test performed. Patient blood work without any obvious electrolyte abnormality that would explain the svt. No pneumonia on CXR. Patient in no respiratory distress here. Will have patient follow up with pcp.   ____________________________________________   FINAL CLINICAL IMPRESSION(S) / ED DIAGNOSES  Final diagnoses:  Tachycardia     Note: This dictation was prepared with Dragon dictation. Any transcriptional errors that result from this process are unintentional     Nance Pear, MD 12/09/18 [phone]

## 2018-12-09 NOTE — ED Notes (Signed)
Patient sent to ed from clinic after being swabbed for covid 19. Reports fever started today productive cough x 5 days. Patient c/o of fast heart rate. md at bedside to assess c/o. Labs ordered. Awaiting results for further plan of care.

## 2018-12-14 ENCOUNTER — Encounter: Payer: Self-pay | Admitting: Internal Medicine

## 2018-12-30 DIAGNOSIS — Z85828 Personal history of other malignant neoplasm of skin: Secondary | ICD-10-CM | POA: Diagnosis not present

## 2018-12-30 DIAGNOSIS — R21 Rash and other nonspecific skin eruption: Secondary | ICD-10-CM | POA: Diagnosis not present

## 2019-01-10 ENCOUNTER — Other Ambulatory Visit: Payer: Self-pay | Admitting: *Deleted

## 2019-01-10 ENCOUNTER — Other Ambulatory Visit: Payer: Self-pay | Admitting: Cardiology

## 2019-01-10 MED ORDER — LOSARTAN POTASSIUM 100 MG PO TABS: 100.0000 mg | ORAL_TABLET | Freq: Every day | ORAL | 0 refills | Status: DC

## 2019-01-12 DIAGNOSIS — B09 Unspecified viral infection characterized by skin and mucous membrane lesions: Secondary | ICD-10-CM | POA: Diagnosis not present

## 2019-01-12 DIAGNOSIS — Z85828 Personal history of other malignant neoplasm of skin: Secondary | ICD-10-CM | POA: Diagnosis not present

## 2019-01-12 DIAGNOSIS — R21 Rash and other nonspecific skin eruption: Secondary | ICD-10-CM | POA: Diagnosis not present

## 2019-02-03 ENCOUNTER — Telehealth: Payer: Self-pay

## 2019-02-03 NOTE — Telephone Encounter (Signed)
Virtual Visit Pre-Appointment Phone Call  "Jonathan Escobar, I am calling you today to discuss your upcoming appointment. We are currently trying to limit exposure to the virus that causes COVID-19 by seeing patients at home rather than in the office."  1. "What is the BEST phone number to call the day of the visit?" - include this in appointment notes  2. "Do you have or have access to (through a family member/friend) a smartphone with video capability that we can use for your visit?" a. If yes - list this number in appt notes as "cell" (if different from BEST phone #) and list the appointment type as a VIDEO visit in appointment notes b. If no - list the appointment type as a PHONE visit in appointment notes  3. Confirm consent - "In the setting of the current Covid19 crisis, you are scheduled for a video visit with your provider on 03/29/2019 at 2:00PM.  Just as we do with many in-office visits, in order for you to participate in this visit, we must obtain consent.  If you'd like, I can send this to your mychart (if signed up) or email for you to review.  Otherwise, I can obtain your verbal consent now.  All virtual visits are billed to your insurance company just like a normal visit would be.  By agreeing to a virtual visit, we'd like you to understand that the technology does not allow for your provider to perform an examination, and thus may limit your provider's ability to fully assess your condition. If your provider identifies any concerns that need to be evaluated in person, we will make arrangements to do so.  Finally, though the technology is pretty good, we cannot assure that it will always work on either your or our end, and in the setting of a video visit, we may have to convert it to a phone-only visit.  In either situation, we cannot ensure that we have a secure connection.  Are you willing to proceed?" STAFF: Did the patient verbally acknowledge consent to telehealth visit? Document YES/NO here:  YES  4. Advise patient to be prepared - "Two hours prior to your appointment, go ahead and check your blood pressure, pulse, oxygen saturation, and your weight (if you have the equipment to check those) and write them all down. When your visit starts, your provider will ask you for this information. If you have an Apple Watch or Kardia device, please plan to have heart rate information ready on the day of your appointment. Please have a pen and paper handy nearby the day of the visit as well."  5. Give patient instructions for MyChart download to smartphone OR Doximity/[email or web address] as below if video visit (depending on what platform provider is using)  6. Inform patient they will receive a phone call 15 minutes prior to their appointment time (may be from unknown caller ID) so they should be prepared to answer    TELEPHONE CALL NOTE  Jonathan Escobar has been deemed a candidate for a follow-up tele-health visit to limit community exposure during the Covid-19 pandemic. I spoke with the patient via phone to ensure availability of phone/video source, confirm preferred email & phone number, and discuss instructions and expectations.  I reminded Jonathan Escobar to be prepared with any vital sign and/or heart rhythm information that could potentially be obtained via home monitoring, at the time of his visit. I reminded Jonathan Escobar to expect a phone call prior to his visit.  [name] 02/03/2019 10:22 AM   INSTRUCTIONS FOR DOWNLOADING THE MYCHART APP TO SMARTPHONE  - The patient must first make sure to have activated MyChart and know their login information - If Apple, go to [employer] and type in MyChart in the search bar and download the app. If Android, ask patient to go to [employer] and type in [place] in the search bar and download the app. The app is free but as with any other app downloads, their phone may require them to verify saved payment information or Apple/Android  password.  - The patient will need to then log into the app with their MyChart username and password, and select [facility] as their healthcare provider to link the account. When it is time for your visit, go to the MyChart app, find appointments, and click Begin Video Visit. Be sure to Select Allow for your device to access the Microphone and Camera for your visit. You will then be connected, and your provider will be with you shortly.  **If they have any issues connecting, or need assistance please contact MyChart service desk [phone])**  **If using a computer, in order to ensure the best quality for their visit they will need to use either of the following Internet Browsers: [employer], or Google Chrome**  IF USING DOXIMITY or DOXY.ME - The patient will receive a link just prior to their visit by text.     FULL LENGTH CONSENT FOR TELE-HEALTH VISIT   I hereby voluntarily request, consent and authorize [facility] and its employed or contracted physicians, physician assistants, nurse practitioners or other licensed health care professionals (the Practitioner), to provide me with telemedicine health care services (the "Services") as deemed necessary by the treating Practitioner. I acknowledge and consent to receive the Services by the Practitioner via telemedicine. I understand that the telemedicine visit will involve communicating with the Practitioner through live audiovisual communication technology and the disclosure of certain medical information by electronic transmission. I acknowledge that I have been given the opportunity to request an in-person assessment or other available alternative prior to the telemedicine visit and am voluntarily participating in the telemedicine visit.  I understand that I have the right to withhold or withdraw my consent to the use of telemedicine in the course of my care at any time, without affecting my right to future care or treatment,  and that the Practitioner or I may terminate the telemedicine visit at any time. I understand that I have the right to inspect all information obtained and/or recorded in the course of the telemedicine visit and may receive copies of available information for a reasonable fee.  I understand that some of the potential risks of receiving the Services via telemedicine include:  Marland Kitchen Delay or interruption in medical evaluation due to technological equipment failure or disruption; . Information transmitted may not be sufficient (e.g. poor resolution of images) to allow for appropriate medical decision making by the Practitioner; and/or  . In rare instances, security protocols could fail, causing a breach of personal health information.  Furthermore, I acknowledge that it is my responsibility to provide information about my medical history, conditions and care that is complete and accurate to the best of my ability. I acknowledge that Practitioner's advice, recommendations, and/or decision may be based on factors not within their control, such as incomplete or inaccurate data provided by me or distortions of diagnostic images or specimens that may result from electronic transmissions. I understand that  the practice of medicine is not an exact science and that Practitioner makes no warranties or guarantees regarding treatment outcomes. I acknowledge that I will receive a copy of this consent concurrently upon execution via email to the email address I last provided but may also request a printed copy by calling the office of [facility].    I understand that my insurance will be billed for this visit.   I have read or had this consent read to me. . I understand the contents of this consent, which adequately explains the benefits and risks of the Services being provided via telemedicine.  . I have been provided ample opportunity to ask questions regarding this consent and the Services and have had my questions  answered to my satisfaction. . I give my informed consent for the services to be provided through the use of telemedicine in my medical care  By participating in this telemedicine visit I agree to the above.

## 2019-02-09 ENCOUNTER — Encounter: Payer: 59 | Admitting: Internal Medicine

## 2019-03-07 ENCOUNTER — Encounter: Payer: Self-pay | Admitting: Internal Medicine

## 2019-03-07 ENCOUNTER — Ambulatory Visit (INDEPENDENT_AMBULATORY_CARE_PROVIDER_SITE_OTHER): Payer: 59 | Admitting: Internal Medicine

## 2019-03-07 ENCOUNTER — Other Ambulatory Visit: Payer: Self-pay

## 2019-03-07 VITALS — BP 132/78 | HR 54 | Ht 69.0 in | Wt 221.0 lb

## 2019-03-07 DIAGNOSIS — Z1159 Encounter for screening for other viral diseases: Secondary | ICD-10-CM | POA: Diagnosis not present

## 2019-03-07 DIAGNOSIS — I700 Atherosclerosis of aorta: Secondary | ICD-10-CM | POA: Insufficient documentation

## 2019-03-07 DIAGNOSIS — K219 Gastro-esophageal reflux disease without esophagitis: Secondary | ICD-10-CM

## 2019-03-07 DIAGNOSIS — E785 Hyperlipidemia, unspecified: Secondary | ICD-10-CM

## 2019-03-07 DIAGNOSIS — Z1211 Encounter for screening for malignant neoplasm of colon: Secondary | ICD-10-CM

## 2019-03-07 DIAGNOSIS — Z0000 Encounter for general adult medical examination without abnormal findings: Secondary | ICD-10-CM | POA: Diagnosis not present

## 2019-03-07 DIAGNOSIS — Z125 Encounter for screening for malignant neoplasm of prostate: Secondary | ICD-10-CM | POA: Diagnosis not present

## 2019-03-07 DIAGNOSIS — I10 Essential (primary) hypertension: Secondary | ICD-10-CM

## 2019-03-07 LAB — POCT URINALYSIS DIPSTICK
Bilirubin, UA: NEGATIVE
Glucose, UA: NEGATIVE
Ketones, UA: NEGATIVE
Leukocytes, UA: NEGATIVE
Nitrite, UA: NEGATIVE
Protein, UA: NEGATIVE
Spec Grav, UA: 1.02 (ref 1.010–1.025)
Urobilinogen, UA: 0.2 E.U./dL
pH, UA: 5 (ref 5.0–8.0)

## 2019-03-07 NOTE — Progress Notes (Signed)
Date:  03/07/2019   Name:  Jonathan Escobar   DOB:  [birth year]   MRN:  [id number]   Chief Complaint: Annual Exam Jonathan Escobar Pimenta is a 73 y.o. male who presents today for his Complete Annual Exam. He feels well. He reports exercising very little. He reports he is sleeping well.    Colonoscopy:  09/2018 Hep C testing - done at [facility]  Hypertension This is a chronic problem. The problem is controlled. Pertinent negatives include no chest pain, headaches, palpitations or shortness of breath. Past treatments include angiotensin blockers and beta blockers. The current treatment provides significant improvement. Hypertensive end-organ damage includes CAD/MI.  Gastroesophageal Reflux He reports no abdominal pain, no chest pain, no choking or no wheezing. This is a recurrent problem. The problem occurs occasionally. Pertinent negatives include no fatigue. He has tried a PPI for the symptoms.  Hyperlipidemia This is a chronic problem. The problem is controlled. Pertinent negatives include no chest pain, myalgias or shortness of breath. Current antihyperlipidemic treatment includes statins and ezetimibe. The current treatment provides significant improvement of lipids.  CAD - doing well, has hx aortic atherosclerosis as well.  On beta blocker, aspirin, statin and Plavix.  He had an episodes of SVT several months ago that lasted 30 minutes.  He had ER evaluation after with no abnormality noted.  It is has not recurred.  Lab Results  Component Value Date   CREATININE 0.81 12/09/2018   BUN 10 12/09/2018   NA 133 (L) 12/09/2018   K 3.4 (L) 12/09/2018   CL 96 (L) 12/09/2018   CO2 25 12/09/2018   Lab Results  Component Value Date   CHOL 126 12/11/2017   HDL 39 (L) 12/11/2017   LDLCALC 60 12/11/2017   TRIG 137 12/11/2017   CHOLHDL 3.2 12/11/2017   Lab Results  Component Value Date   WBC 10.6 (H) 12/09/2018   HGB 13.2 12/09/2018   HCT 39.2 12/09/2018   MCV 90.1 12/09/2018   PLT 205 12/09/2018      Review of Systems  Constitutional: Negative for appetite change, chills, diaphoresis, fatigue and unexpected weight change.  HENT: Negative for hearing loss, tinnitus, trouble swallowing and voice change.   Eyes: Negative for visual disturbance.  Respiratory: Negative for choking, shortness of breath and wheezing.   Cardiovascular: Negative for chest pain, palpitations and leg swelling.  Gastrointestinal: Negative for abdominal pain, blood in stool, constipation and diarrhea.  Genitourinary: Negative for difficulty urinating, dysuria and frequency.  Musculoskeletal: Positive for arthralgias (left knee). Negative for back pain and myalgias.  Skin: Positive for rash (from viral xanthem this spring). Negative for color change.  Neurological: Negative for dizziness, syncope and headaches.  Hematological: Negative for adenopathy.  Psychiatric/Behavioral: Negative for dysphoric mood and sleep disturbance.    Patient Active Problem List   Diagnosis Date Noted  . Diverticulitis 01/19/2018  . GERD (gastroesophageal reflux disease) 01/19/2018  . Non-recurrent unilateral inguinal hernia without obstruction or gangrene   . Right inguinal hernia 07/02/2016  . Personal history of other malignant neoplasm of skin 10/08/2015  . Essential hypertension 07/19/2014  . Fatigue 01/05/2014  . Heart palpitations 09/10/2013  . Obesity (BMI 30-39.9) 08/09/2013  . STEMI 07/29/13- successful complex full metal jacket stenting of the SVG-PDA/PLA -07/29/2013, staged SVG-Dx PCI 12/01 07/29/2013  . CAD (coronary artery disease), autologous vein bypass graft --> full metal jacket PCI of SVG-RCA; PCI of SVG-D1 anastomosis 07/02/2013  . Hyperlipidemia LDL goal <70   . 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 04/29/2012  . ST elevation myocardial infarction (STEMI) of inferior wall, initial episode of care - s/p PTCA of 95% ISR to 70% and 100% distal stent occlusion to ~40% (8/25) 04/25/2012  .  H/O: GI bleed - 2007 on Plavix 04/25/2012  . CAD in native artery - prior Inferior MI - RCA PCI; 04/2012 - Inferior STEMI - RCA occluded (unable to dilate lesion adequately) + LAD & D1 disease --> referred for CABG 04/07/2012    Allergies  Allergen Reactions  . Norvasc [Amlodipine Besylate]     Dizzy  . Ace Inhibitors Other (See Comments)    Cough     Past Surgical History:  Procedure Laterality Date  . CARDIAC CATHETERIZATION  04/28/12   RCA: 95% ISR followed by 100% stenosis post-stent with significant RPL lesions; LAD: Tandem 80 and 70% lesions involving D1 (1, 1, 1) --> initial angioplasty to RCA aspiration thrombectomy --> urgent CABG;  . CORONARY ARTERY BYPASS GRAFT  04/28/2012   Procedure: CORONARY ARTERY BYPASS GRAFTING (CABG);  Surgeon: Gaye Pollack, MD;  Location: [place];  Service: Open Heart Surgery;  Laterality: N/A;  . HERNIA REPAIR Left   . INGUINAL HERNIA REPAIR Right 08/06/2016   Procedure: HERNIA REPAIR INGUINAL ADULT;  Surgeon: Clayburn Pert, MD;  Location: [place];  Service: General;  Laterality: Right;  . LEFT AND RIGHT HEART CATHETERIZATION WITH CORONARY ANGIOGRAM N/A 04/25/2012   Procedure: LEFT AND RIGHT HEART CATHETERIZATION WITH CORONARY ANGIOGRAM;  Surgeon: Leonie Man, MD;  Location: [facility] CATH LAB;  Service: Cardiovascular;  Laterality: N/A;  . LEFT HEART CATHETERIZATION WITH CORONARY/GRAFT ANGIOGRAM  07/29/2013   Procedure: LEFT HEART CATHETERIZATION WITH Beatrix Fetters;  Surgeon: Blane Ohara, MD;  Location: [facility] CATH LAB;  Service: Cardiovascular; ; 95% stenosis with percent mid occlusion of SVG-RPDA; 90% anastomotic SVG-diagonal, moderate 50% disease in the native circumflex with patent OM 1 OM 2. 80-90% proximal LAD with patent LIMA to LAD , EF 50%  . MOHS SURGERY     FOREHEAD  . NM MYOVIEW LTD  Jan 2015   Moderate Inferior infarct (as expected), but no ischemia & EF ~45-50%  . PERCUTANEOUS CORONARY STENT INTERVENTION (PCI-S)   07/29/2013   Procedure: PERCUTANEOUS CORONARY STENT INTERVENTION (PCI-S);  Surgeon: Blane Ohara, MD;  Location: [facility] CATH LAB;  Service: Cardiovascular;; PCI to SVG-RPDA: Promus DES - 3 mm x 38 mm, 3 mm x 38 mm, 3 mm x 20 mm, 2.75 mm x 38 mm -- all postdilated to ~3.3 mm;  . PERCUTANEOUS CORONARY STENT INTERVENTION (PCI-S) N/A 08/01/2013   Procedure: PERCUTANEOUS CORONARY STENT INTERVENTION (PCI-S);  Surgeon: Leonie Man, MD;  Location: [facility] CATH LAB;  Service: Cardiovascular;staged PCI to distal SVG-diagonal: Xience Xpedition 2.5 mm x 20 mm (2.66 mm)    . TRANSTHORACIC ECHOCARDIOGRAM  12/'13; 07/30/2013   a) Post CABG: EF 50-55%, moderate HK of anteroseptal wall, septal dyskinesis/dyssynergy due to poststernotomy;  grade 1 diastolic dysfunction. Mildly dilated LA, mildly reduced RV function-likely due to septal dyssynergy.; b) 11/'14 Post Inferior STEMI: EF 50-55% mild LVH. Mild hypokinesis of the distal lateral, and basal to mid inferior lateral and inferior walls -- consistent with RCA infarct    Social History   Tobacco Use  . Smoking status: Former Smoker    Packs/day: 1.00    Years: 10.00    Pack years: 10.00    Types: Cigarettes    Quit date: 09/01/1976    Years since quitting:  42.5  . Smokeless tobacco: Never Used  Substance Use Topics  . Alcohol use: Yes    Alcohol/week: 2.0 standard drinks    Types: 2 Shots of liquor per week    Comment: 1-2 shots of jack daniels a day.  . Drug use: No     Medication list has been reviewed and updated.  Current Meds  Medication Sig  . acetaminophen (TYLENOL) 325 MG tablet Take 2 tablets (650 mg total) by mouth every 4 (four) hours as needed for headache or mild pain.  . Ascorbic Acid (VITAMIN C) 1000 MG tablet Take 1,000 mg by mouth daily.  Marland Kitchen aspirin 81 MG tablet Take 81 mg by mouth daily.  Marland Kitchen atorvastatin (LIPITOR) 10 MG tablet Take 1 tablet (10 mg total) by mouth daily.  . cetirizine (ZYRTEC) 10 MG tablet Take 10 mg by mouth daily as  needed for allergies. PM  . clopidogrel (PLAVIX) 75 MG tablet Take 1 tablet (75 mg total) by mouth daily with breakfast.  . ezetimibe (ZETIA) 10 MG tablet Take 1 tablet (10 mg total) daily by mouth.  . losartan (COZAAR) 100 MG tablet Take 1 tablet (100 mg total) by mouth daily.  . metoprolol succinate (TOPROL-XL) 50 MG 24 hr tablet Take 1 tablet (50 mg total) by mouth daily. Take with or immediately following a meal.  . NITROSTAT 0.4 MG SL tablet PLACE 1 TABLET UNDER THE TONGUE EVERY 5 MINUTES X 3 DOSES AS NEEDED FOR CHEST PAIN.  Marland Kitchen pantoprazole (PROTONIX) 40 MG tablet Take 1 tablet (40 mg total) by mouth daily at 6 (six) AM. (Patient taking differently: Take 40 mg by mouth daily at 6 (six) AM. PM)  . traZODone (DESYREL) 50 MG tablet Take 50 mg by mouth at bedtime.  . triamcinolone cream (KENALOG) 0.1 %     PHQ 2/9 Scores 03/07/2019 02/03/2018  PHQ - 2 Score 0 0  PHQ- 9 Score 0 -    BP Readings from Last 3 Encounters:  03/07/19 132/78  12/09/18 (!) 151/101  07/23/18 (!) 144/80    Physical Exam Vitals signs and nursing note reviewed.  Constitutional:      Appearance: Normal appearance. He is well-developed.  HENT:     Head: Normocephalic.     Right Ear: Tympanic membrane, ear canal and external ear normal.     Left Ear: Tympanic membrane, ear canal and external ear normal.     Nose: Nose normal.     Mouth/Throat:     Pharynx: Uvula midline.  Eyes:     Conjunctiva/sclera: Conjunctivae normal.     Pupils: Pupils are equal, round, and reactive to light.  Neck:     Musculoskeletal: Normal range of motion and neck supple.     Thyroid: No thyromegaly.     Vascular: No carotid bruit.  Cardiovascular:     Rate and Rhythm: Normal rate and regular rhythm.     Heart sounds: Normal heart sounds.  Pulmonary:     Effort: Pulmonary effort is normal.     Breath sounds: Normal breath sounds. No wheezing.  Chest:     Breasts:        Right: No mass.        Left: No mass.  Abdominal:      General: Bowel sounds are normal.     Palpations: Abdomen is soft.     Tenderness: There is no abdominal tenderness.  Musculoskeletal: Normal range of motion.     Left knee: He exhibits effusion. He exhibits  normal range of motion and no swelling. No tenderness found.     Right lower leg: No edema.     Left lower leg: No edema.  Lymphadenopathy:     Cervical: No cervical adenopathy.  Skin:    General: Skin is warm and dry.     Capillary Refill: Capillary refill takes less than 2 seconds.       Neurological:     Mental Status: He is alert and oriented to person, place, and time.     Deep Tendon Reflexes: Reflexes are normal and symmetric.  Psychiatric:        Speech: Speech normal.        Behavior: Behavior normal.        Thought Content: Thought content normal.        Judgment: Judgment normal.     Wt Readings from Last 3 Encounters:  03/07/19 221 lb (100.2 kg)  12/09/18 214 lb (97.1 kg)  07/23/18 221 lb 12 oz (100.6 kg)    BP 132/78   Pulse (!) 54   Ht 5\' 9"  (1.753 m)   Wt 221 lb (100.2 kg)   SpO2 98%   BMI 32.64 kg/m   Assessment and Plan: 1. Annual physical exam Normal exam except for weight Regular exercise recommended - POCT urinalysis dipstick - UA essentially normal  2. Prostate cancer screening DRE deferred - PSA  3. Colon cancer screening Done this year by [facility]  4. Need for hepatitis C screening test - Hepatitis C antibody  5. Essential hypertension controlled - Comprehensive metabolic panel  6. Gastroesophageal reflux disease without esophagitis On PPI - CBC with Differential/Platelet  7. Hyperlipidemia LDL goal <70 Continue statin and zetia - Lipid panel  8. Atherosclerosis of aorta (HCC) On statin, aspirin and Plavix   Partially dictated using Editor, commissioning. Any errors are unintentional.  Halina Maidens, MD [facility]  03/07/2019

## 2019-03-08 LAB — CBC WITH DIFFERENTIAL/PLATELET
Basophils Absolute: 0.1 10*3/uL (ref 0.0–0.2)
Basos: 1 %
EOS (ABSOLUTE): 0.3 10*3/uL (ref 0.0–0.4)
Eos: 4 %
Hematocrit: 41.9 % (ref 37.5–51.0)
Hemoglobin: 14.1 g/dL (ref 13.0–17.7)
Immature Grans (Abs): 0.1 10*3/uL (ref 0.0–0.1)
Immature Granulocytes: 1 %
Lymphocytes Absolute: 1.2 10*3/uL (ref 0.7–3.1)
Lymphs: 18 %
MCH: 30.6 pg (ref 26.6–33.0)
MCHC: 33.7 g/dL (ref 31.5–35.7)
MCV: 91 fL (ref 79–97)
Monocytes Absolute: 0.9 10*3/uL (ref 0.1–0.9)
Monocytes: 13 %
Neutrophils Absolute: 4.1 10*3/uL (ref 1.4–7.0)
Neutrophils: 63 %
Platelets: 241 10*3/uL (ref 150–450)
RBC: 4.61 x10E6/uL (ref 4.14–5.80)
RDW: 12.7 % (ref 11.6–15.4)
WBC: 6.5 10*3/uL (ref 3.4–10.8)

## 2019-03-08 LAB — COMPREHENSIVE METABOLIC PANEL
ALT: 24 IU/L (ref 0–44)
AST: 20 IU/L (ref 0–40)
Albumin/Globulin Ratio: 1.5 (ref 1.2–2.2)
Albumin: 4.2 g/dL (ref 3.7–4.7)
Alkaline Phosphatase: 66 IU/L (ref 39–117)
BUN/Creatinine Ratio: 13 (ref 10–24)
BUN: 12 mg/dL (ref 8–27)
Bilirubin Total: 0.6 mg/dL (ref 0.0–1.2)
CO2: 21 mmol/L (ref 20–29)
Calcium: 9.3 mg/dL (ref 8.6–10.2)
Chloride: 99 mmol/L (ref 96–106)
Creatinine, Ser: 0.95 mg/dL (ref 0.76–1.27)
GFR calc Af Amer: 92 mL/min/{1.73_m2} (ref >59)
GFR calc non Af Amer: 80 mL/min/{1.73_m2} (ref >59)
Globulin, Total: 2.8 g/dL (ref 1.5–4.5)
Glucose: 105 mg/dL — ABNORMAL HIGH (ref 65–99)
Potassium: 4.2 mmol/L (ref 3.5–5.2)
Sodium: 134 mmol/L (ref 134–144)
Total Protein: 7 g/dL (ref 6.0–8.5)

## 2019-03-08 LAB — LIPID PANEL
Chol/HDL Ratio: 3 ratio (ref 0.0–5.0)
Cholesterol, Total: 136 mg/dL (ref 100–199)
HDL: 45 mg/dL (ref >39)
LDL Calculated: 68 mg/dL (ref 0–99)
Triglycerides: 114 mg/dL (ref 0–149)
VLDL Cholesterol Cal: 23 mg/dL (ref 5–40)

## 2019-03-08 LAB — HEPATITIS C ANTIBODY: Hep C Virus Ab: <0.1 s/co ratio (ref 0.0–0.9)

## 2019-03-08 LAB — PSA: Prostate Specific Ag, Serum: 2.4 ng/mL (ref 0.0–4.0)

## 2019-03-29 ENCOUNTER — Other Ambulatory Visit: Payer: Self-pay

## 2019-03-29 ENCOUNTER — Telehealth (INDEPENDENT_AMBULATORY_CARE_PROVIDER_SITE_OTHER): Payer: 59 | Admitting: Cardiovascular Disease

## 2019-03-29 ENCOUNTER — Encounter: Payer: Self-pay | Admitting: Cardiovascular Disease

## 2019-03-29 VITALS — BP 138/89 | HR 65 | Ht 69.0 in | Wt 214.0 lb

## 2019-03-29 DIAGNOSIS — I25810 Atherosclerosis of coronary artery bypass graft(s) without angina pectoris: Secondary | ICD-10-CM | POA: Diagnosis not present

## 2019-03-29 DIAGNOSIS — R002 Palpitations: Secondary | ICD-10-CM

## 2019-03-29 DIAGNOSIS — I10 Essential (primary) hypertension: Secondary | ICD-10-CM

## 2019-03-29 DIAGNOSIS — E785 Hyperlipidemia, unspecified: Secondary | ICD-10-CM

## 2019-03-29 DIAGNOSIS — I2510 Atherosclerotic heart disease of native coronary artery without angina pectoris: Secondary | ICD-10-CM

## 2019-03-29 MED ORDER — LOSARTAN POTASSIUM 100 MG PO TABS: 100.0000 mg | ORAL_TABLET | Freq: Every day | ORAL | 0 refills | Status: DC

## 2019-03-29 MED ORDER — METOPROLOL SUCCINATE ER 50 MG PO TB24: 50.0000 mg | ORAL_TABLET | Freq: Every day | ORAL | 3 refills | Status: DC

## 2019-03-29 NOTE — Patient Instructions (Signed)
Medication Instructions:  Continue same medications If you need a refill on your cardiac medications before your next appointment, please call your pharmacy.   Lab work: None If you have labs (blood work) drawn today and your tests are completely normal, you will receive your results only by: . MyChart Message (if you have MyChart) OR . A paper copy in the mail If you have any lab test that is abnormal or we need to change your treatment, we will call you to review the results.  Testing/Procedures: None  Follow-Up: At [facility], you and your health needs are our priority.  As part of our continuing mission to provide you with exceptional heart care, we have created designated Provider Care Teams.  These Care Teams include your primary Cardiologist (physician) and Advanced Practice Providers (APPs -  Physician Assistants and Nurse Practitioners) who all work together to provide you with the care you need, when you need it. You will need a follow up appointment in 6 months.  Please call our office 2 months in advance to schedule this appointment.  You may see Shell Blanchette, MD or one of the following Advanced Practice Providers on your designated Care Team:   Christopher Berge, NP Ryan Dunn, PA-C . Jacquelyn Visser, PA-C   

## 2019-03-29 NOTE — Progress Notes (Signed)
Virtual Visit via Video Note   This visit type was conducted due to national recommendations for restrictions regarding the COVID-19 Pandemic (e.g. social distancing) in an effort to limit this patient's exposure and mitigate transmission in our community.  Due to his co-morbid illnesses, this patient is at least at moderate risk for complications without adequate follow up.  This format is felt to be most appropriate for this patient at this time.  All issues noted in this document were discussed and addressed.  A limited physical exam was performed with this format.  Please refer to the patient's chart for his consent to telehealth for [facility].   Date:  03/29/2019   ID:  Jonathan Escobar, DOB 06-06-1946, MRN [id number]  Patient Location: Home Provider Location: Office  PCP:  Glean Hess, MD  Cardiologist:  Kathlyn Sacramento, MD  Electrophysiologist:  None   Evaluation Performed:  Follow-Up Visit  Chief Complaint: Doing well with no complaints  History of Present Illness:    Jonathan Escobar is a 73 y.o. male was seen via video visit for follow-up regarding coronary artery disease. 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  Inferior STEMI 04/25/2012 - 95% ISR followed by 5% stenosis post stent with lesions in the PL and PDA. Also noted 70% mid LAD; after aspiration thrombectomy, balloon angioplasty performed and he was sent for urgent CABG  (LIMA-LAD, SVG-PDA and PLA, SVG-Diag)   Inferior STEMI in November 2014 with occluded SVG-PDA --> extensive PCI (full metal jacket) of SVG followed by staged PCI of anastomotic SVG-diagonal lesion. No cardiac events since then.   He had a treadmill nuclear stress test done at the [place] in 2017 which showed no evidence of ischemia with normal ejection fraction.   He has known history of intermittent palpitations due to suspected paroxysmal supraventricular tachycardia.  A ZIO monitor last year showed only short runs.  He has been doing well with no recent chest pain, shortness of breath or dizziness.  He has occasional palpitations.  He did have an episode of tachycardia in April and went to the emergency room but was back in sinus rhythm after Valsalva maneuvers. 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   The patient does not have symptoms concerning for COVID-19 infection (fever, chills, cough, or new shortness of breath).    Past Medical History:  Diagnosis Date  . CAD (coronary artery disease), autologous vein bypass graft Nov 2014   Inf STEMI - PCI to SVG-RPDA:   . CAD S/P percutaneous coronary angioplasty 1999   BMS to prox RCA 1999; Brachytherapy ~2003, followed by DES  PCI (Taxus) for ISR.  Marland Kitchen Coronary stent restenosis due to progression of disease 2000; 2005   (While Still In [place]) Status post Brachytherapy to RCA ISR - 2000; Redo PCI with DES 2005  . Diverticulitis   . Essential hypertension    CONTROLLED ON MEDS  . GERD (gastroesophageal reflux disease)   . History of GI bleed     while on Plavix  . History of viral meningitis    following GI bleed   . History of: ST elevation myocardial infarction (STEMI) of inferior wall, subsequent episode of care April 25, 2012;  RCA: 95% ISR followed by 100% stenosis post-stent with significant RPL lesions; LAD: Tandem 80 and 70% lesions involving D1 (1, 1, 1) --> initial angioplasty to RCA aspiration thrombectomy --> urgent CABG;  . Hyperlipidemia LDL goal <70   . Obesity (BMI 30.0-34.9)   . S/P CABG x 4: LIMA-LAD, seq SVG-PD-PLA; SVG-diag. 04/28/2012  . S/P coronary artery stent placement Nov-Dec 2014   a) Inf STEMIL 11/28/'14 --> extensive (full metal Jacket) of SVG-RPDA Promus DES - 3 mm x 38 mm, 3 mm x 38 mm, 3 mm x 20 mm, 2.75  mm x 38 mm -- all postdilated to ~3.3 mm;; b) 12/1/'14: staged PCI to distal SVG-diagonal: Xience Xpedition 2.5 mm x 20 mm (2.66 mm)  . ST elevation myocardial infarction (STEMI) of inferior wall, subsequent episode of care [facility])  Jul 29, 2013   Diffuse near occlusion of SVG-rPDA, 90% SVG-Diag  . Supraventricular tachycardia [facility])    Past Surgical History:  Procedure Laterality Date  . CARDIAC CATHETERIZATION  04/28/12   RCA: 95% ISR followed by 100% stenosis post-stent with significant RPL lesions; LAD: Tandem 80 and 70% lesions involving D1 (1, 1, 1) --> initial angioplasty to RCA aspiration thrombectomy --> urgent CABG;  . CORONARY ARTERY BYPASS GRAFT  04/28/2012   Procedure: CORONARY ARTERY BYPASS GRAFTING (CABG);  Surgeon: Gaye Pollack, MD;  Location: [place];  Service: Open Heart Surgery;  Laterality: N/A;  . HERNIA REPAIR Left   . INGUINAL HERNIA REPAIR Right 08/06/2016   Procedure: HERNIA REPAIR INGUINAL ADULT;  Surgeon: Clayburn Pert, MD;  Location: [place];  Service: General;  Laterality: Right;  . LEFT AND RIGHT HEART CATHETERIZATION WITH CORONARY ANGIOGRAM N/A 04/25/2012   Procedure: LEFT AND RIGHT HEART CATHETERIZATION WITH CORONARY ANGIOGRAM;  Surgeon: Leonie Man, MD;  Location: [facility] CATH LAB;  Service: Cardiovascular;  Laterality: N/A;  . LEFT HEART CATHETERIZATION WITH CORONARY/GRAFT ANGIOGRAM  07/29/2013   Procedure: LEFT HEART CATHETERIZATION WITH Beatrix Fetters;  Surgeon: Blane Ohara, MD;  Location: [facility] CATH LAB;  Service: Cardiovascular; ; 95% stenosis with percent mid occlusion of SVG-RPDA; 90% anastomotic SVG-diagonal, moderate 50% disease in the native circumflex with patent OM 1 OM 2. 80-90% proximal LAD with patent LIMA to LAD , EF 50%  . MOHS SURGERY     FOREHEAD  . NM MYOVIEW LTD  Jan 2015   Moderate Inferior infarct (as expected), but no ischemia & EF ~45-50%  . PERCUTANEOUS CORONARY STENT INTERVENTION (PCI-S)  07/29/2013   Procedure:  PERCUTANEOUS CORONARY STENT INTERVENTION (PCI-S);  Surgeon: Blane Ohara, MD;  Location: [facility] CATH LAB;  Service: Cardiovascular;; PCI to SVG-RPDA: Promus DES - 3 mm x 38 mm, 3 mm x 38 mm, 3 mm x 20 mm, 2.75 mm x 38 mm -- all postdilated to ~3.3 mm;  . PERCUTANEOUS CORONARY STENT INTERVENTION (PCI-S) N/A 08/01/2013   Procedure: PERCUTANEOUS CORONARY STENT INTERVENTION (PCI-S);  Surgeon: Leonie Man, MD;  Location[facility] C[facility] LAB;  Service: Cardiovascular;staged PCI to distal SVG-diagonal: Xience Xpedition 2.5 mm x 20 mm (2.66 mm)    . TRANSTHORACIC ECHOCARDIOGRAM  12/'13; 07/30/2013   a) Post CABG: EF 50-55%, moderate HK of anteroseptal wall, septal dyskinesis/dyssynergy due to poststernotomy;  grade 1 diastolic dysfunction. Mildly dilated LA, mildly reduced RV function-likely due to septal dyssynergy.; b) 11/'14 Post Inferior STEMI: EF 50-55% mild LVH. Mild hypokinesis of the distal lateral, and basal to mid inferior lateral and inferior walls -- consistent with RCA infarct  Current Meds  Medication Sig  . acetaminophen (TYLENOL) 325 MG tablet Take 2 tablets (650 mg total) by mouth every 4 (four) hours as needed for headache or mild pain.  . Ascorbic Acid (VITAMIN C) 1000 MG tablet Take 1,000 mg by mouth daily.  Marland Kitchen aspirin 81 MG tablet Take 81 mg by mouth daily.  Marland Kitchen atorvastatin (LIPITOR) 10 MG tablet Take 1 tablet (10 mg total) by mouth daily.  . cetirizine (ZYRTEC) 10 MG tablet Take 10 mg by mouth daily as needed for allergies. PM  . clopidogrel (PLAVIX) 75 MG tablet Take 1 tablet (75 mg total) by mouth daily with breakfast.  . ezetimibe (ZETIA) 10 MG tablet Take 1 tablet (10 mg total) daily by mouth.  . losartan (COZAAR) 100 MG tablet Take 1 tablet (100 mg total) by mouth daily.  . metoprolol succinate (TOPROL-XL) 50 MG 24 hr tablet Take 1 tablet (50 mg total) by mouth daily. Take with or immediately following a meal.  . NITROSTAT 0.4 MG SL tablet PLACE 1 TABLET UNDER THE TONGUE EVERY 5  MINUTES X 3 DOSES AS NEEDED FOR CHEST PAIN.  Marland Kitchen pantoprazole (PROTONIX) 40 MG tablet Take 1 tablet (40 mg total) by mouth daily at 6 (six) AM. (Patient taking differently: Take 40 mg by mouth daily at 6 (six) AM. PM)  . traZODone (DESYREL) 50 MG tablet Take 50 mg by mouth at bedtime.  . triamcinolone cream (KENALOG) 0.1 % Apply 1 application topically as needed.      Allergies:   Norvasc [amlodipine besylate] and Ace inhibitors   Social History   Tobacco Use  . Smoking status: Former Smoker    Packs/day: 1.00    Years: 10.00    Pack years: 10.00    Types: Cigarettes    Quit date: 09/01/1976    Years since quitting: 42.6  . Smokeless tobacco: Never Used  Substance Use Topics  . Alcohol use: Yes    Alcohol/week: 2.0 standard drinks    Types: 2 Shots of liquor per week    Comment: 1-2 shots of jack daniels a day.  . Drug use: No     Family Hx: The patient's family history includes Alzheimer's disease in his mother; Coronary artery disease in his father; Heart failure in his father; Valvular heart disease in his sister.  ROS:   Please see the history of present illness.     All other systems reviewed and are negative.   Prior CV studies:   The following studies were reviewed today:    Labs/Other Tests and Data Reviewed:    EKG:  An ECG dated April 2020 was personally reviewed today and demonstrated:  Sinus rhythm with left anterior fascicular block  Recent Labs: 03/07/2019: ALT 24; BUN 12; Creatinine, Ser 0.95; Hemoglobin 14.1; Platelets 241; Potassium 4.2; Sodium 134   Recent Lipid Panel Lab Results  Component Value Date/Time   CHOL 136 03/07/2019 08:52 AM   CHOL 153 09/07/2012 12:01 PM   TRIG 114 03/07/2019 08:52 AM   TRIG 185 09/07/2012 12:01 PM   HDL 45 03/07/2019 08:52 AM   HDL 31 (L) 09/07/2012 12:01 PM   CHOLHDL 3.0 03/07/2019 08:52 AM   CHOLHDL 3.2 07/30/2013 04:03 AM   LDLCALC 68 03/07/2019 08:52 AM   LDLCALC 85 09/07/2012 12:01 PM    Wt Readings from  Last 3 Encounters:  03/29/19 214 lb (97.1 kg)  03/07/19 221 lb (100.2 kg)  12/09/18 214 lb (97.1 kg)     Objective:  Vital Signs:  BP 138/89 (BP Location: Left Arm, Patient Position: Sitting, Cuff Size: Normal)   Pulse 65   Ht 5\' 9"  (1.753 m)   Wt 214 lb (97.1 kg)   BMI 31.60 kg/m    VITAL SIGNS:  reviewed GEN:  no acute distress EYES:  sclerae anicteric, EOMI - Extraocular Movements Intact RESPIRATORY:  normal respiratory effort, symmetric expansion SKIN:  no rash, lesions or ulcers. MUSCULOSKELETAL:  no obvious deformities. NEURO:  alert and oriented x 3, no obvious focal deficit PSYCH:  normal affect  ASSESSMENT & PLAN:     1.  Palpitations: These were thought to be due to short runs of SVT as well as PACs.  His symptoms are overall stable with minimal palpitations at the present time.    2. Coronary artery disease involving bypass graft without angina: Continue indefinite dual antiplatelet therapy.  He has no anginal symptoms at the present time.  3. Hyperlipidemia: He is tolerating small dose of atorvastatin and Zetia.  I reviewed most recent lipid profile showed an LDL of 68  3. Essential hypertension: Blood pressure is mildly elevated.  Continue to monitor for now. we can consider adding small dose of amlodipine.  It is listed that he had dizziness with it in the past.  We can also consider switching metoprolol to carvedilol.     COVID-19 Education: The signs and symptoms of COVID-19 were discussed with the patient and how to seek care for testing (follow up with PCP or arrange E-visit).  The importance of social distancing was discussed today.  Time:   Today, I have spent 12 minutes with the patient with telehealth technology discussing the above problems.     Medication Adjustments/Labs and Tests Ordered: Current medicines are reviewed at length with the patient today.  Concerns regarding medicines are outlined above.   Tests Ordered: No orders of the  defined types were placed in this encounter.   Medication Changes: No orders of the defined types were placed in this encounter.   Follow Up:  In Person in 6 month(s)  Signed, Kathlyn Sacramento, MD  03/29/2019 2:23 PM    [place]

## 2019-05-16 ENCOUNTER — Other Ambulatory Visit: Payer: Self-pay | Admitting: Cardiovascular Disease

## 2019-07-14 ENCOUNTER — Encounter: Payer: Self-pay | Admitting: Gastroenterology

## 2019-07-14 ENCOUNTER — Ambulatory Visit (INDEPENDENT_AMBULATORY_CARE_PROVIDER_SITE_OTHER): Payer: Medicare HMO | Admitting: Gastroenterology

## 2019-07-14 ENCOUNTER — Other Ambulatory Visit: Payer: Self-pay

## 2019-07-14 VITALS — BP 129/75 | HR 56 | Temp 98.3°F | Ht 69.0 in | Wt 227.4 lb

## 2019-07-14 DIAGNOSIS — R110 Nausea: Secondary | ICD-10-CM | POA: Diagnosis not present

## 2019-07-14 DIAGNOSIS — R1013 Epigastric pain: Secondary | ICD-10-CM

## 2019-07-14 NOTE — Progress Notes (Signed)
Primary Care Physician: Glean Hess, MD  Primary Gastroenterologist:  Dr. Lucilla Lame  No chief complaint on file.   HPI: Jonathan Escobar is a 73 y.o. male here with a history of indigestion.  The patient had seen me in the past for diarrhea and had previously had a colonoscopy 2 years prior to seeing me last year but was not sure when he needed his next colonoscopy.  The patient was started on probiotics and told to get the pathology report from his last colonoscopy to see if he needed another colonoscopy within 3 to 5 years.  The patient had a CT scan done in May of last year that showed:  IMPRESSION: Acute diverticulitis of the sigmoid colon. Negative for abscess or free intraperitoneal air. Focal wall thickening of the superior left urinary bladder, likely reactive given the adjacent diverticulitis. Aortic Atherosclerosis (ICD10-I70.0).  The patient now reports that he had a sharp pain in his right upper quadrant that occurred about a month ago.  It lasted for a week but then resolved.  He is now reporting that he has had nausea that is usually in the morning.  The patient takes Protonix and takes it right before he goes to sleep.  The nausea is worse when he wakes up and then gets better throughout the day.  The patient was started on Zofran but he states that gave him constipation.  The patient has had a repeat colonoscopy at the [place] and was found to have 1 polyp last year.  The patient's nausea and abdominal pain was not related to fatty foods or greasy foods.  Current Outpatient Medications  Medication Sig Dispense Refill  . acetaminophen (TYLENOL) 325 MG tablet Take 2 tablets (650 mg total) by mouth every 4 (four) hours as needed for headache or mild pain.    . Ascorbic Acid (VITAMIN C) 1000 MG tablet Take 1,000 mg by mouth daily.    Marland Kitchen aspirin 81 MG tablet Take 81 mg by mouth daily.    Marland Kitchen atorvastatin (LIPITOR) 10 MG tablet TAKE 1 TABLET (10 MG TOTAL) BY MOUTH DAILY. 90  tablet 3  . cetirizine (ZYRTEC) 10 MG tablet Take 10 mg by mouth daily as needed for allergies. PM    . clopidogrel (PLAVIX) 75 MG tablet Take 1 tablet (75 mg total) by mouth daily with breakfast. 90 tablet 3  . ezetimibe (ZETIA) 10 MG tablet Take 1 tablet (10 mg total) daily by mouth. 90 tablet 3  . losartan (COZAAR) 100 MG tablet Take 1 tablet (100 mg total) by mouth daily. 90 tablet 0  . metoprolol succinate (TOPROL-XL) 50 MG 24 hr tablet Take 1 tablet (50 mg total) by mouth daily. Take with or immediately following a meal. 90 tablet 3  . NITROSTAT 0.4 MG SL tablet PLACE 1 TABLET UNDER THE TONGUE EVERY 5 MINUTES X 3 DOSES AS NEEDED FOR CHEST PAIN. 25 tablet 3  . pantoprazole (PROTONIX) 40 MG tablet Take 1 tablet (40 mg total) by mouth daily at 6 (six) AM. (Patient taking differently: Take 40 mg by mouth daily at 6 (six) AM. PM) 90 tablet 3  . traZODone (DESYREL) 50 MG tablet Take 50 mg by mouth at bedtime.    . triamcinolone cream (KENALOG) 0.1 % Apply 1 application topically as needed.      No current facility-administered medications for this visit.     Allergies as of 07/14/2019 - Review Complete 03/29/2019  Allergen Reaction Noted  . Norvasc [amlodipine  besylate]  08/02/2013  . Ace inhibitors Other (See Comments) 04/30/2012    ROS:  General: Negative for anorexia, weight loss, fever, chills, fatigue, weakness. ENT: Negative for hoarseness, difficulty swallowing , nasal congestion. CV: Negative for chest pain, angina, palpitations, dyspnea on exertion, peripheral edema.  Respiratory: Negative for dyspnea at rest, dyspnea on exertion, cough, sputum, wheezing.  GI: See history of present illness. GU:  Negative for dysuria, hematuria, urinary incontinence, urinary frequency, nocturnal urination.  Endo: Negative for unusual weight change.    Physical Examination:   There were no vitals taken for this visit.  General: Well-nourished, well-developed in no acute distress.  Eyes: No  icterus. Conjunctivae pink. Lungs: Clear to auscultation bilaterally. Non-labored. Heart: Regular rate and rhythm, no murmurs rubs or gallops.  Abdomen: Bowel sounds are normal, nontender, nondistended, no hepatosplenomegaly or masses, no abdominal bruits or hernia , no rebound or guarding.   Extremities: No lower extremity edema. No clubbing or deformities. Neuro: Alert and oriented x 3.  Grossly intact. Skin: Warm and dry, no jaundice.   Psych: Alert and cooperative, normal mood and affect.  Labs:    Imaging Studies: No results found.  Assessment and Plan:   Jonathan Escobar is a 73 y.o. y/o male who comes in today with a report of nausea.  The patient's abdominal pain has resolved and only lasted a week and was about a month ago.  The patient feels better after he burps and has dyspepsia associated with his nausea.  The patient will be switched to Dexilant to be taken in the evening to see if this is acid mediated.  If the symptoms do not improve the patient has been told to call my office and be set up for an upper endoscopy.  The patient has been explained the plan and agrees with it.     Lucilla Lame, MD. Marval Regal    Note: This dictation was prepared with Dragon dictation along with smaller phrase technology. Any transcriptional errors that result from this process are unintentional.

## 2019-07-18 ENCOUNTER — Other Ambulatory Visit: Payer: Self-pay

## 2019-07-18 ENCOUNTER — Ambulatory Visit
Admission: RE | Admit: 2019-07-18 | Discharge: 2019-07-18 | Disposition: A | Discharge status: Home / Self Care | Payer: Medicare HMO | Source: Ambulatory Visit | Attending: Gastroenterology | Admitting: Gastroenterology

## 2019-07-18 DIAGNOSIS — R1032 Left lower quadrant pain: Secondary | ICD-10-CM

## 2019-07-18 DIAGNOSIS — K5732 Diverticulitis of large intestine without perforation or abscess without bleeding: Secondary | ICD-10-CM | POA: Diagnosis not present

## 2019-07-18 LAB — POCT I-STAT CREATININE: Creatinine, Ser: 0.9 mg/dL (ref 0.61–1.24)

## 2019-07-18 MED ORDER — AMOXICILLIN-POT CLAVULANATE 875-125 MG PO TABS: 1.0000 | ORAL_TABLET | Freq: Two times a day (BID) | ORAL | 0 refills | Status: DC

## 2019-07-18 MED ORDER — IOHEXOL 300 MG/ML  SOLN
100.0000 mL | Freq: Once | INTRAMUSCULAR | Status: AC | PRN
  Administered 2019-07-18: 100 mL via INTRAVENOUS

## 2019-07-26 MED ORDER — METOPROLOL SUCCINATE ER 50 MG PO TB24: 50.0000 mg | ORAL_TABLET | Freq: Every day | ORAL | 0 refills | Status: DC

## 2019-07-26 MED ORDER — CLOPIDOGREL BISULFATE 75 MG PO TABS: 75.0000 mg | ORAL_TABLET | Freq: Every day | ORAL | 0 refills | Status: DC

## 2019-08-09 ENCOUNTER — Other Ambulatory Visit: Payer: Self-pay

## 2019-08-09 ENCOUNTER — Telehealth: Payer: Self-pay | Admitting: Cardiovascular Disease

## 2019-08-09 MED ORDER — EZETIMIBE 10 MG PO TABS: 10.0000 mg | ORAL_TABLET | Freq: Every day | ORAL | 3 refills | Status: DC

## 2019-08-09 NOTE — Telephone Encounter (Signed)
°*  STAT* If patient is at the pharmacy, call can be transferred to refill team.   1. Which medications need to be refilled? (please list name of each medication and dose if known)  ezetimibe (ZETIA) 10 MG tablet(Expired) traZODone (DESYREL) 50 MG tablet  2. Which pharmacy/location (including street and city if local pharmacy) is medication to be sent to? [facility] [address] [place]   3. Do they need a 30 day or 90 day supply? 90 day  His original refill was sent to mail order and was left sitting at the post office for about a month.

## 2019-08-09 NOTE — Telephone Encounter (Signed)
ezetimibe (ZETIA) 10 MG tablet 90 tablet 3 08/09/2019 11/07/2019   Sig - Route: Take 1 tablet (10 mg total) by mouth daily. - Oral   Sent to pharmacy as: ezetimibe (ZETIA) 10 MG tablet   E-Prescribing Status: Transmission to pharmacy in progress (08/09/2019 11:33 AM EST)   Pharmacy  [facility] [place], [place]    Trazodone will need to be filled by PCP

## 2019-08-23 DIAGNOSIS — M25562 Pain in left knee: Secondary | ICD-10-CM | POA: Insufficient documentation

## 2019-08-31 DIAGNOSIS — M25562 Pain in left knee: Secondary | ICD-10-CM | POA: Diagnosis not present

## 2019-08-31 DIAGNOSIS — M238X2 Other internal derangements of left knee: Secondary | ICD-10-CM | POA: Diagnosis not present

## 2019-08-31 DIAGNOSIS — M2392 Unspecified internal derangement of left knee: Secondary | ICD-10-CM | POA: Diagnosis not present

## 2019-09-14 DIAGNOSIS — H524 Presbyopia: Secondary | ICD-10-CM | POA: Diagnosis not present

## 2019-10-20 ENCOUNTER — Other Ambulatory Visit: Payer: Self-pay | Admitting: Cardiovascular Disease

## 2019-10-21 ENCOUNTER — Telehealth: Payer: Self-pay

## 2019-10-21 MED ORDER — ATORVASTATIN CALCIUM 10 MG PO TABS: 10.0000 mg | ORAL_TABLET | Freq: Every day | ORAL | 0 refills | Status: DC

## 2019-10-21 NOTE — Telephone Encounter (Signed)
Requested Prescriptions   Signed Prescriptions Disp Refills  . atorvastatin (LIPITOR) 10 MG tablet 30 tablet 0    Sig: Take 1 tablet (10 mg total) by mouth daily. TAKE 1 TABLET (10 MG TOTAL) BY MOUTH DAILY. Please call to schedule office visit for further refills. Thank you!    Authorizing Provider: Kathlyn Sacramento A    Ordering User: Raelene Bott, Talisa Petrak L

## 2019-10-25 ENCOUNTER — Other Ambulatory Visit: Payer: Self-pay | Admitting: *Deleted

## 2019-10-25 MED ORDER — ATORVASTATIN CALCIUM 10 MG PO TABS: 10.0000 mg | ORAL_TABLET | Freq: Every day | ORAL | 0 refills | Status: DC

## 2019-10-25 NOTE — Telephone Encounter (Signed)
Requested Prescriptions   Signed Prescriptions Disp Refills  . atorvastatin (LIPITOR) 10 MG tablet 30 tablet 0    Sig: Take 1 tablet (10 mg total) by mouth daily. TAKE 1 TABLET (10 MG TOTAL) BY MOUTH DAILY.    Authorizing Provider: Kathlyn Sacramento A    Ordering User: Britt Bottom

## 2019-11-13 ENCOUNTER — Other Ambulatory Visit: Payer: Self-pay | Admitting: Cardiovascular Disease

## 2019-12-13 ENCOUNTER — Other Ambulatory Visit: Payer: Self-pay | Admitting: Cardiovascular Disease

## 2019-12-13 NOTE — Telephone Encounter (Signed)
Please schedule overdue 6 month F/U appointment. Thank you! °

## 2019-12-20 ENCOUNTER — Encounter: Payer: Self-pay | Admitting: Physician Assistant

## 2019-12-20 ENCOUNTER — Ambulatory Visit (INDEPENDENT_AMBULATORY_CARE_PROVIDER_SITE_OTHER): Payer: Medicare HMO | Admitting: Physician Assistant

## 2019-12-20 ENCOUNTER — Other Ambulatory Visit: Payer: Self-pay

## 2019-12-20 VITALS — BP 142/84 | HR 69 | Ht 69.0 in | Wt 229.0 lb

## 2019-12-20 DIAGNOSIS — I2510 Atherosclerotic heart disease of native coronary artery without angina pectoris: Secondary | ICD-10-CM

## 2019-12-20 DIAGNOSIS — K219 Gastro-esophageal reflux disease without esophagitis: Secondary | ICD-10-CM | POA: Diagnosis not present

## 2019-12-20 DIAGNOSIS — I252 Old myocardial infarction: Secondary | ICD-10-CM | POA: Diagnosis not present

## 2019-12-20 DIAGNOSIS — E785 Hyperlipidemia, unspecified: Secondary | ICD-10-CM | POA: Diagnosis not present

## 2019-12-20 DIAGNOSIS — Z951 Presence of aortocoronary bypass graft: Secondary | ICD-10-CM | POA: Diagnosis not present

## 2019-12-20 DIAGNOSIS — I10 Essential (primary) hypertension: Secondary | ICD-10-CM

## 2019-12-20 DIAGNOSIS — R002 Palpitations: Secondary | ICD-10-CM | POA: Diagnosis not present

## 2019-12-20 DIAGNOSIS — E669 Obesity, unspecified: Secondary | ICD-10-CM

## 2019-12-20 DIAGNOSIS — Z85828 Personal history of other malignant neoplasm of skin: Secondary | ICD-10-CM

## 2019-12-20 DIAGNOSIS — Z8719 Personal history of other diseases of the digestive system: Secondary | ICD-10-CM | POA: Diagnosis not present

## 2019-12-20 MED ORDER — ATORVASTATIN CALCIUM 10 MG PO TABS: 10.0000 mg | ORAL_TABLET | Freq: Every day | ORAL | 0 refills | Status: DC

## 2019-12-20 MED ORDER — CARVEDILOL 12.5 MG PO TABS: 12.5000 mg | ORAL_TABLET | Freq: Two times a day (BID) | ORAL | 3 refills | Status: DC

## 2019-12-20 NOTE — Patient Instructions (Addendum)
Medication Instructions:   Your physician has recommended you make the following change in your medication:   1. STOP Metoprolol  2. START Carvedilol 12.5MG  - Take one tablet my mouth TWICE a day  Refills for your Atrovastatin have been sent in.      *If you need a refill on your cardiac medications before your next appointment, please call your pharmacy*   Lab Work:  1. None Ordered  If you have labs (blood work) drawn today and your tests are completely normal, you will receive your results only by: Marland Kitchen MyChart Message (if you have MyChart) OR . A paper copy in the mail If you have any lab test that is abnormal or we need to change your treatment, we will call you to review the results.   Testing/Procedures:  1. None Order  Follow-Up: At [facility], you and your health needs are our priority.  As part of our continuing mission to provide you with exceptional heart care, we have created designated Provider Care Teams.  These Care Teams include your primary Cardiologist (physician) and Advanced Practice Providers (APPs -  Physician Assistants and Nurse Practitioners) who all work together to provide you with the care you need, when you need it.  We recommend signing up for the patient portal called "MyChart".  Sign up information is provided on this After Visit Summary.  MyChart is used to connect with patients for Virtual Visits (Telemedicine).  Patients are able to view lab/test results, encounter notes, upcoming appointments, etc.  Non-urgent messages can be sent to your provider as well.   To learn more about what you can do with MyChart, go to [email or web address].    Your next appointment:   6 month(s)  The format for your next appointment:   In Person  Provider:    You may see Kathlyn Sacramento, MD or one of the following Advanced Practice Providers on your designated Care Team:    Murray Hodgkins, NP  Christell Faith, PA-C  Marrianne Mood, PA-C    Other  Instructions Please monitor your Blood Pressure.  If your blood pressure is staying consistantly above 140/90 please call the office [phone].

## 2019-12-20 NOTE — Progress Notes (Signed)
Office Visit    Patient Name: Jonathan Escobar Date of Encounter: 12/20/2019  Primary Care Provider:  Glean Hess, MD Primary Cardiologist:  Kathlyn Sacramento, MD  Chief Complaint    Chief Complaint  Patient presents with  . Other    6 month follow up. Meds reviewed verbally with patient.     74 yo male with history of CAD as outlined below and including initial cardiac event in 1999 with BMS to the proximal RCA, brachytherapy 2000, 2003, redo PCI with DES in 2005 for ISR, inferior STEMI 2013, CABG 2013, inferior STEMI 2014, and here for 6 month follow-up.   Past Medical History    Past Medical History:  Diagnosis Date  . CAD (coronary artery disease), autologous vein bypass graft Nov 2014   Inf STEMI - PCI to SVG-RPDA:   . CAD S/P percutaneous coronary angioplasty 1999   BMS to prox RCA 1999; Brachytherapy ~2003, followed by DES  PCI (Taxus) for ISR.  Marland Kitchen Coronary stent restenosis due to progression of disease 2000; 2005   (While Still In [place]) Status post Brachytherapy to RCA ISR - 2000; Redo PCI with DES 2005  . Diverticulitis   . Essential hypertension    CONTROLLED ON MEDS  . GERD (gastroesophageal reflux disease)   . History of GI bleed     while on Plavix  . History of viral meningitis    following GI bleed   . History of: ST elevation myocardial infarction (STEMI) of inferior wall, subsequent episode of care April 25, 2012;   RCA: 95% ISR followed by 100% stenosis post-stent with significant RPL lesions; LAD: Tandem 80 and 70% lesions involving D1 (1, 1, 1) --> initial angioplasty to RCA aspiration thrombectomy --> urgent CABG;  . Hyperlipidemia LDL goal <70   . Obesity (BMI 30.0-34.9)   . S/P CABG x 4: LIMA-LAD, seq SVG-PD-PLA; SVG-diag. 04/28/2012  . S/P coronary artery stent placement Nov-Dec 2014   a) Inf STEMIL 11/28/'14 --> extensive (full metal Jacket) of SVG-RPDA Promus DES - 3 mm x 38 mm, 3 mm x 38 mm, 3 mm x 20 mm, 2.75 mm x 38 mm -- all postdilated  to ~3.3 mm;; b) 12/1/'14: staged PCI to distal SVG-diagonal: Xience Xpedition 2.5 mm x 20 mm (2.66 mm)  . ST elevation myocardial infarction (STEMI) of inferior wall, subsequent episode of care [facility])  Jul 29, 2013   Diffuse near occlusion of SVG-rPDA, 90% SVG-Diag  . Supraventricular tachycardia [facility])    Past Surgical History:  Procedure Laterality Date  . CARDIAC CATHETERIZATION  04/28/12   RCA: 95% ISR followed by 100% stenosis post-stent with significant RPL lesions; LAD: Tandem 80 and 70% lesions involving D1 (1, 1, 1) --> initial angioplasty to RCA aspiration thrombectomy --> urgent CABG;  . CORONARY ARTERY BYPASS GRAFT  04/28/2012   Procedure: CORONARY ARTERY BYPASS GRAFTING (CABG);  Surgeon: Gaye Pollack, MD;  Location: [place];  Service: Open Heart Surgery;  Laterality: N/A;  . HERNIA REPAIR Left   . INGUINAL HERNIA REPAIR Right 08/06/2016   Procedure: HERNIA REPAIR INGUINAL ADULT;  Surgeon: Clayburn Pert, MD;  Location: [place];  Service: General;  Laterality: Right;  . LEFT AND RIGHT HEART CATHETERIZATION WITH CORONARY ANGIOGRAM N/A 04/25/2012   Procedure: LEFT AND RIGHT HEART CATHETERIZATION WITH CORONARY ANGIOGRAM;  Surgeon: Leonie Man, MD;  Location: [facility] CATH LAB;  Service: Cardiovascular;  Laterality: N/A;  . LEFT HEART CATHETERIZATION WITH CORONARY/GRAFT ANGIOGRAM  07/29/2013  Procedure: LEFT HEART CATHETERIZATION WITH Beatrix Fetters;  Surgeon: Blane Ohara, MD;  Location: [facility] CATH LAB;  Service: Cardiovascular; ; 95% stenosis with percent mid occlusion of SVG-RPDA; 90% anastomotic SVG-diagonal, moderate 50% disease in the native circumflex with patent OM 1 OM 2. 80-90% proximal LAD with patent LIMA to LAD , EF 50%  . MOHS SURGERY     FOREHEAD  . NM MYOVIEW LTD  Jan 2015   Moderate Inferior infarct (as expected), but no ischemia & EF ~45-50%  . PERCUTANEOUS CORONARY STENT INTERVENTION (PCI-S)  07/29/2013   Procedure: PERCUTANEOUS CORONARY STENT  INTERVENTION (PCI-S);  Surgeon: Blane Ohara, MD;  Location: [facility] CATH LAB;  Service: Cardiovascular;; PCI to SVG-RPDA: Promus DES - 3 mm x 38 mm, 3 mm x 38 mm, 3 mm x 20 mm, 2.75 mm x 38 mm -- all postdilated to ~3.3 mm;  . PERCUTANEOUS CORONARY STENT INTERVENTION (PCI-S) N/A 08/01/2013   Procedure: PERCUTANEOUS CORONARY STENT INTERVENTION (PCI-S);  Surgeon: Leonie Man, MD;  Location: [facility] CATH LAB;  Service: Cardiovascular;staged PCI to distal SVG-diagonal: Xience Xpedition 2.5 mm x 20 mm (2.66 mm)    . TRANSTHORACIC ECHOCARDIOGRAM  12/'13; 07/30/2013   a) Post CABG: EF 50-55%, moderate HK of anteroseptal wall, septal dyskinesis/dyssynergy due to poststernotomy;  grade 1 diastolic dysfunction. Mildly dilated LA, mildly reduced RV function-likely due to septal dyssynergy.; b) 11/'14 Post Inferior STEMI: EF 50-55% mild LVH. Mild hypokinesis of the distal lateral, and basal to mid inferior lateral and inferior walls -- consistent with RCA infarct    Allergies  Allergies  Allergen Reactions  . Norvasc [Amlodipine Besylate]     Dizzy  . Ace Inhibitors Other (See Comments)    Cough     History of Present Illness    [name] is a 74 y.o. male with PMH as above.  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  He suffered his initial cardiac event 1999 with BMS to the proximal RCA.  He had brachytherapy in 2000 and 2003. He underwent redo PCI with Taxus DES in 2005 for in-stent restenosis.  He suffered an inferior STEMI 04/25/2012 with 95% in-stent restenosis, followed by 5% stenosis post stent with lesions to the PL and PDA.  Also noted with 70% mid LAD stenosis.  After aspiration thrombectomy, balloon angioplasty was performed and he was sent for urgent CABG (LIMA to LAD, SVG to PDA and PLA, SVG to diagonal).  He then suffered an inferior STEMI in November 2014 with occluded SVG to PDA.  He underwent extensive PCI (full metal jacket) of SVG, followed by staged PCI of  anastomotic SVG to diagonal lesion.  Since then, he has not had further cardiac events.  He underwent a nuclear stress test done at the [place] in 2017, which showed no ischemia and normal EF.  He has a known history of intermittent palpitations due to suspected paroxysmal SVT.  He underwent a ZIO monitor in 2019 that showed only short runs.  When last seen via telemedicine on 03/29/2019, he was noted to be doing well.  He had occasional palpitations.  He had a brief episode of tachycardia in April 2019 and went to the emergency room but was back in sinus rhythm after Valsalva maneuvers.  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  Today, he returns to clinic and notes that he is doing well.  He continues to suffer from the  palpitations, at which time he sometimes feels lightheaded.  He reports the palpitations are frequent but not sustained. No syncope or falls. No chest pain, SOB, or DOE. No feelings of abdominal distention, orthopnea, PND, or weight gain due to volume overload. He denies significant edema or claudication sx.  He is eating and sleeping well. He reports medication compliance and denies any signs or symptoms of bleeding. He is looking forward to retirement from urgent care (in 1 month).  Home Medications    Prior to Admission medications   Medication Sig Start Date End Date Taking? Authorizing Provider  acetaminophen (TYLENOL) 325 MG tablet Take 2 tablets (650 mg total) by mouth every 4 (four) hours as needed for headache or mild pain. 08/02/13  Yes Kilroy, Luke K, PA-C  amoxicillin-clavulanate (AUGMENTIN) 875-125 MG tablet Take 1 tablet by mouth 2 (two) times daily. 07/18/19  Yes Lucilla Lame, MD  Ascorbic Acid (VITAMIN C) 1000 MG tablet Take 1,000 mg by mouth daily.   Yes [provider]  aspirin 81 MG tablet Take 81 mg by mouth daily.   Yes [provider]  atorvastatin (LIPITOR) 10 MG tablet Take 1 tablet (10 mg total) by mouth  daily. 12/13/19  Yes Wellington Hampshire, MD  cetirizine (ZYRTEC) 10 MG tablet Take 10 mg by mouth daily as needed for allergies. PM   Yes [provider]  clopidogrel (PLAVIX) 75 MG tablet TAKE 1 TABLET (75 MG TOTAL) BY MOUTH DAILY WITH BREAKFAST. 10/20/19  Yes Wellington Hampshire, MD  ezetimibe (ZETIA) 10 MG tablet Take 1 tablet (10 mg total) by mouth daily. 08/09/19 12/20/19 Yes Wellington Hampshire, MD  metoprolol succinate (TOPROL-XL) 50 MG 24 hr tablet TAKE 1 TABLET (50 MG TOTAL) BY MOUTH DAILY. TAKE WITH OR IMMEDIATELY FOLLOWING A MEAL. 10/20/19  Yes Arida, Mertie Clause, MD  NITROSTAT 0.4 MG SL tablet PLACE 1 TABLET UNDER THE TONGUE EVERY 5 MINUTES X 3 DOSES AS NEEDED FOR CHEST PAIN. 04/01/17  Yes Leonie Man, MD  pantoprazole (PROTONIX) 40 MG tablet Take 1 tablet (40 mg total) by mouth daily at 6 (six) AM. Patient taking differently: Take 40 mg by mouth daily at 6 (six) AM. PM 08/02/13  Yes Kilroy, Doreene Burke, PA-C  traZODone (DESYREL) 50 MG tablet Take 50 mg by mouth at bedtime.   Yes [provider]  triamcinolone cream (KENALOG) 0.1 % Apply 1 application topically as needed.  01/12/19  Yes [provider]  valsartan (DIOVAN) 320 MG tablet Take 320 mg by mouth daily.   Yes [provider]    Review of Systems    He denies chest pain, dyspnea, pnd, orthopnea, n, v, dizziness, syncope, edema, weight gain, or early satiety. He reports frequent palpitations with occasional associated lightheadedness. Palpitations are not sustained. All other systems reviewed and are otherwise negative except as noted above.  Physical Exam    VS:  BP (!) 142/84 (BP Location: Left Arm, Patient Position: Sitting, Cuff Size: Normal)   Pulse 69   Ht 5\' 9"  (1.753 m)   Wt 229 lb (103.9 kg)   BMI 33.82 kg/m  , BMI Body mass index is 33.82 kg/m. GEN: Well nourished, well developed, in no acute distress. HEENT: normal. Neck: Supple, no JVD, carotid bruits, or masses. Cardiac: RRR with  extrasystole, no murmurs, rubs, or gallops. No clubbing, cyanosis, significant edema.  Radials/DP/PT 2+ and equal bilaterally.  Respiratory:  Respirations regular and unlabored, clear to auscultation bilaterally. GI: Soft, nontender, nondistended, BS +  x 4. MS: no deformity or atrophy. Skin: warm and dry, no rash. Neuro:  Strength and sensation are intact. Psych: Normal affect.  Accessory Clinical Findings    ECG personally reviewed by me today - SR with PACs, LAD, prior inferior infarct - no acute ST/T changes when compared to that of his previous EKG.  VITALS Reviewed today   Temp Readings from Last 3 Encounters:  07/14/19 98.3 F (36.8 C) (Oral)  12/09/18 100.1 F (37.8 C) (Oral)  02/03/18 98.2 F (36.8 C) (Oral)   BP Readings from Last 3 Encounters:  12/20/19 (!) 142/84  07/14/19 129/75  03/29/19 138/89   Pulse Readings from Last 3 Encounters:  12/20/19 69  07/14/19 (!) 56  03/29/19 65    Wt Readings from Last 3 Encounters:  12/20/19 229 lb (103.9 kg)  07/14/19 227 lb 6.4 oz (103.1 kg)  03/29/19 214 lb (97.1 kg)     LABS  reviewed today   CareEverwhere Labs present and most recent? Yes/No: No  Lab Results  Component Value Date   WBC 6.5 03/07/2019   HGB 14.1 03/07/2019   HCT 41.9 03/07/2019   MCV 91 03/07/2019   PLT 241 03/07/2019   Lab Results  Component Value Date   CREATININE 0.90 07/18/2019   BUN 12 03/07/2019   NA 134 03/07/2019   K 4.2 03/07/2019   CL 99 03/07/2019   CO2 21 03/07/2019   Lab Results  Component Value Date   ALT 24 03/07/2019   AST 20 03/07/2019   ALKPHOS 66 03/07/2019   BILITOT 0.6 03/07/2019   Lab Results  Component Value Date   CHOL 136 03/07/2019   HDL 45 03/07/2019   LDLCALC 68 03/07/2019   TRIG 114 03/07/2019   CHOLHDL 3.0 03/07/2019    Lab Results  Component Value Date   HGBA1C 6.0 (H) 07/29/2013   Lab Results  Component Value Date   TSH 0.925 04/25/2012     STUDIES/PROCEDURES reviewed today    07/23/2018 Normal sinus rhythm with an average heart rate of 61 bpm. 3 episodes of supraventricular tachycardia were noted with the fastest lasting 5 beats with a heart rate of 171 bpm. Frequent PACs were noted with a burden of 16%. Very rare PVCs.  2017 Doppler Scanned results but ruled nl study  09/2013 NM Study Overall Impression:  Low risk stress nuclear study with a fixed basal inferolateral scar. No ischemia. LV Wall Motion:  EF 49%, basal inferolateral akinesis.  Echo 2014 - Left ventricle: The cavity size was normal. Wall thickness  was increased in a pattern of mild LVH. Systolic function  was normal. The estimated ejection fraction was in the  range of 50% to 55%. Mild hypokinesis of the distallateral  myocardium. Mild hypokinesis of the basal-midinferolateral  and inferior myocardium. Left ventricular diastolic  function parameters were normal.  - Right ventricle: The cavity size was mildly dilated. Wall  thickness was normal. Systolic function was mildly  reduced.  - Atrial septum: No defect or patent foramen ovale was  identified.    Assessment & Plan    Palpitations, pSVT --Previous monitor as above in 2019.  Palpitations thought to be 2/2 short runs of SVT, as well as PACs.  He reports stable symptoms with frequent palpitations that are nonsustained but do bother him and interfere at times with his life.  We discussed the previous documented suggestions from his primary cardiologist regarding changes to his current BB with patient agreeable to transition from metoprolol succinate  50mg  qd to carvedilol 12.5mg  BID to see if this offers any additional relief in the frequency of his palpitations, along with BP support as HR allows.    We will discontinue Toprol and start carvedilol 12.5 mg twice daily. He will let us know how he responds to this medication. Recommend uptitration of this carvedilol for optimal palpitation control /  BP control as HR  allows.  HLD --Continue current statin and Zetia.  03/07/2019 LDL 68 and at goal LDL <70. Continue annual lipid checks for aggressive risk factor modification.  HTN --Blood pressure mildly elevated at 142/84 today.  Of note, he discontinued losartan 100mg  and started on valsartan 320mg  earlier this year, due to some research // oncology recommendations.  He is happy with this change in ARB and has sufficient refills, We will update his medications list. We discussed the possible addition of amlodipine for additional BP support; however, he has reacted poorly to this medication in the past, and his preference is for trial carvedilol over that of re-trial of amlodipine to improve his BP.    We will transition his current metoprolol to carvedilol 12.5 mg twice daily and up- titrate as needed for control of palpitations/BP as heart rate allows. Continue with his valsartan.  CAD --No chest pain. No symptoms concerning for angina.  He has not needed his nitro.  No indication for further ischemic work-up at this time.  Continue current ASA, ARB, BB/carvedilol, statin, Zetia, and as needed sublingual nitro.  Continue aggressive risk factor modification.   GERD --No s/sx of GIB on ASA 81mg  daily. Continue current Protonix.   Medication changes: Discontinue Toprol.  Start carvedilol 12.5 mg twice daily and up-titrate as needed. Add valsartan 320mg  daily to medications list.  Labs ordered: None Studies / Imaging ordered: None Future considerations: Up titration of carvedilol Disposition: RTC 6 months   Arvil Chaco, PA-C 12/20/2019

## 2020-01-06 ENCOUNTER — Other Ambulatory Visit: Payer: Self-pay | Admitting: Cardiovascular Disease

## 2020-01-12 ENCOUNTER — Other Ambulatory Visit: Payer: Self-pay | Admitting: Cardiovascular Disease

## 2020-03-11 ENCOUNTER — Encounter: Payer: Self-pay | Admitting: Internal Medicine

## 2020-03-11 NOTE — Progress Notes (Signed)
Date:  03/12/2020   Name:  Jonathan Escobar   DOB:  1946/01/08   MRN:  [id number]   Chief Complaint: Annual Exam (Pt got pfizer vaccine. Will send a picture on my chart to confirm dates.)  Jonathan Escobar is a 74 y.o. male who presents today for his Complete Annual Exam. He feels well. He reports exercising - works in his garden but otherwise no exercise at this time. He reports he is sleeping well. He recently retired.  He just started [employer] with his wife.  Colonoscopy:  09/2018 Immunization History  Administered Date(s) Administered   Influenza-Unspecified 06/01/2013   Pneumococcal Conjugate-13 11/17/2014   Pneumococcal Polysaccharide-23 03/26/2016    Hypertension This is a chronic problem. The problem is controlled. Associated symptoms include palpitations (Cardiology follows this.) and shortness of breath (wiuth exercise- Cardiology follows.). Pertinent negatives include no chest pain or headaches. Past treatments include angiotensin blockers and beta blockers. The current treatment provides significant improvement. Hypertensive end-organ damage includes CAD/MI. recently changed toprol to coreg.  Gastroesophageal Reflux He complains of heartburn. He reports no abdominal pain, no chest pain, no choking or no wheezing. This is a recurrent problem. The problem occurs rarely. Pertinent negatives include no fatigue. He has tried a PPI for the symptoms. The treatment provided significant relief.  Hyperlipidemia The problem is controlled. Associated symptoms include shortness of breath (wiuth exercise- Cardiology follows.). Pertinent negatives include no chest pain or myalgias. Current antihyperlipidemic treatment includes statins. The current treatment provides significant improvement of lipids.  Diabetes He presents for his follow-up diabetic visit. Diabetes type: prediabetes. Disease course: last A1C was 6 years ago. Pertinent negatives for hypoglycemia include no dizziness or headaches.  Pertinent negatives for diabetes include no chest pain and no fatigue. An ACE inhibitor/angiotensin II receptor blocker is being taken.  CAD - seen by cardiology in April.  Was doing well except for frequent PVCs.  Toprol was changed to Coreg to help control both the PVCs and BP which was slightly elevated.   Lab Results  Component Value Date   CREATININE 0.96 03/12/2020   BUN 13 03/12/2020   NA 135 03/12/2020   K 4.1 03/12/2020   CL 104 03/12/2020   CO2 23 03/12/2020   Lab Results  Component Value Date   CHOL 136 03/07/2019   HDL 45 03/07/2019   LDLCALC 68 03/07/2019   TRIG 114 03/07/2019   CHOLHDL 3.0 03/07/2019   Lab Results  Component Value Date   TSH 0.925 04/25/2012   Lab Results  Component Value Date   HGBA1C 6.0 (H) 07/29/2013   Lab Results  Component Value Date   WBC 5.4 03/12/2020   HGB 13.6 03/12/2020   HCT 38.9 (L) 03/12/2020   MCV 89.8 03/12/2020   PLT 204 03/12/2020   Lab Results  Component Value Date   ALT 26 03/12/2020   AST 23 03/12/2020   ALKPHOS 63 03/12/2020   BILITOT 1.0 03/12/2020     Review of Systems  Constitutional: Negative for appetite change, chills, diaphoresis, fatigue and unexpected weight change.  HENT: Positive for tinnitus. Negative for hearing loss, trouble swallowing and voice change.   Eyes: Negative for visual disturbance.  Respiratory: Positive for shortness of breath (wiuth exercise- Cardiology follows.). Negative for choking and wheezing.   Cardiovascular: Positive for palpitations (Cardiology follows this.). Negative for chest pain and leg swelling.  Gastrointestinal: Positive for heartburn. Negative for abdominal pain, blood in stool, constipation and diarrhea.  Genitourinary: Negative for difficulty urinating,  dysuria and frequency.  Musculoskeletal: Negative for arthralgias, back pain and myalgias.  Skin: Negative for color change and rash.  Neurological: Negative for dizziness, syncope and headaches.   Hematological: Negative for adenopathy.  Psychiatric/Behavioral: Negative for dysphoric mood and sleep disturbance.    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 04/29/2012   ST elevation myocardial infarction (STEMI) of inferior wall, initial episode of care - s/p PTCA of 95% ISR to 70% and 100% distal stent occlusion to ~40% (8/25) 04/25/2012   H/O: GI bleed - 2007 on Plavix 04/25/2012    Allergies  Allergen Reactions   Norvasc [Amlodipine Besylate]     Dizzy   Ace Inhibitors Other (See Comments)    Cough     Past Surgical History:  Procedure Laterality Date   CARDIAC CATHETERIZATION  04/28/12   RCA: 95% ISR followed by 100% stenosis post-stent with significant RPL lesions; LAD: Tandem 80 and 70% lesions involving D1 (1, 1, 1) --> initial angioplasty to RCA aspiration thrombectomy --> urgent CABG;   CORONARY ARTERY BYPASS GRAFT  04/28/2012   Procedure: CORONARY ARTERY BYPASS GRAFTING (CABG);  Surgeon: Gaye Pollack, MD;  Location: [facility];  Service: Open Heart Surgery;  Laterality: N/A;   HERNIA REPAIR Left    INGUINAL HERNIA REPAIR Right 08/06/2016   Procedure: HERNIA REPAIR INGUINAL ADULT;  Surgeon: Clayburn Pert, MD;  Location: [place] [facility];  Service: General;  Laterality: Right;   LEFT AND RIGHT HEART CATHETERIZATION WITH CORONARY ANGIOGRAM N/A 04/25/2012   Procedure: LEFT AND RIGHT HEART  CATHETERIZATION WITH CORONARY ANGIOGRAM;  Surgeon: Leonie Man, MD;  Location: [facility] CATH LAB;  Service: Cardiovascular;  Laterality: N/A;   LEFT HEART CATHETERIZATION WITH CORONARY/GRAFT ANGIOGRAM  07/29/2013   Procedure: LEFT HEART CATHETERIZATION WITH Beatrix Fetters;  Surgeon: Blane Ohara, MD;  Location: [facility] CATH LAB;  Service: Cardiovascular; ; 95% stenosis with percent mid occlusion of SVG-RPDA; 90% anastomotic SVG-diagonal, moderate 50% disease in the native circumflex with patent OM 1 OM 2. 80-90% proximal LAD with patent LIMA to LAD , EF 50%   MOHS SURGERY     FOREHEAD   NM MYOVIEW LTD  Jan 2015   Moderate Inferior infarct (as expected), but no ischemia & EF ~45-50%   PERCUTANEOUS CORONARY STENT INTERVENTION (PCI-S)  07/29/2013   Procedure: PERCUTANEOUS CORONARY STENT INTERVENTION (PCI-S);  Surgeon: Blane Ohara, MD;  Location: [facility] CATH LAB;  Service: Cardiovascular;; PCI to SVG-RPDA: Promus DES - 3 mm x 38 mm, 3 mm x 38 mm, 3 mm x 20 mm, 2.75 mm x 38 mm -- all postdilated to ~3.3 mm;   PERCUTANEOUS CORONARY STENT INTERVENTION (PCI-S) N/A 08/01/2013   Procedure: PERCUTANEOUS CORONARY STENT INTERVENTION (PCI-S);  Surgeon: Leonie Man, MD;  Location: [facility] CATH LAB;  Service: Cardiovascular;staged PCI to distal SVG-diagonal: Xience Xpedition 2.5 mm x 20 mm (2.66 mm)     TRANSTHORACIC ECHOCARDIOGRAM  12/'13; 07/30/2013   a) Post CABG:  EF 50-55%, moderate HK of anteroseptal wall, septal dyskinesis/dyssynergy due to poststernotomy;  grade 1 diastolic dysfunction. Mildly dilated LA, mildly reduced RV function-likely due to septal dyssynergy.; b) 11/'14 Post Inferior STEMI: EF 50-55% mild LVH. Mild hypokinesis of the distal lateral, and basal to mid inferior lateral and inferior walls -- consistent with RCA infarct    Social History   Tobacco Use   Smoking status: Former Smoker    Packs/day: 1.00    Years: 10.00    Pack years: 10.00    Types: Cigarettes    Quit date:  09/01/1976    Years since quitting: 43.5   Smokeless tobacco: Never Used  Vaping Use   Vaping Use: Never used  Substance Use Topics   Alcohol use: Yes    Alcohol/week: 2.0 standard drinks    Types: 2 Shots of liquor per week    Comment: 1-2 shots of jack daniels a day.   Drug use: No     Medication list has been reviewed and updated.  Current Meds  Medication Sig   acetaminophen (TYLENOL) 325 MG tablet Take 2 tablets (650 mg total) by mouth every 4 (four) hours as needed for headache or mild pain.   Ascorbic Acid (VITAMIN C) 1000 MG tablet Take 1,000 mg by mouth daily.   aspirin 81 MG tablet Take 81 mg by mouth daily.   atorvastatin (LIPITOR) 10 MG tablet Take 1 tablet (10 mg total) by mouth daily.   carvedilol (COREG) 12.5 MG tablet Take 1 tablet (12.5 mg total) by mouth 2 (two) times daily.   cetirizine (ZYRTEC) 10 MG tablet Take 10 mg by mouth daily as needed for allergies. PM   clopidogrel (PLAVIX) 75 MG tablet TAKE 1 TABLET (75 MG TOTAL) BY MOUTH DAILY WITH BREAKFAST.   ezetimibe (ZETIA) 10 MG tablet Take 1 tablet (10 mg total) by mouth daily.   NITROSTAT 0.4 MG SL tablet PLACE 1 TABLET UNDER THE TONGUE EVERY 5 MINUTES X 3 DOSES AS NEEDED FOR CHEST PAIN.   pantoprazole (PROTONIX) 40 MG tablet Take 1 tablet (40 mg total) by mouth daily at 6 (six) AM. (Patient taking differently: Take 40 mg by mouth daily at 6 (six) AM. PM)   traZODone (DESYREL) 50 MG tablet Take 50 mg by mouth at bedtime.   triamcinolone cream (KENALOG) 0.1 % Apply 1 application topically as needed.    valsartan (DIOVAN) 320 MG tablet Take 320 mg by mouth daily.    PHQ 2/9 Scores 03/12/2020 03/07/2019 02/03/2018  PHQ - 2 Score 0 0 0  PHQ- 9 Score 0 0 -    GAD 7 : Generalized Anxiety Score 03/12/2020  Nervous, Anxious, on Edge 0  Control/stop worrying 0  Worry too much - different things 0  Trouble relaxing 0  Restless 0  Easily annoyed or irritable 0  Afraid - awful might happen 0  Total GAD  7 Score 0  Anxiety Difficulty Not difficult at all    BP Readings from Last 3 Encounters:  03/12/20 132/84  12/20/19 (!) 142/84  07/14/19 129/75    Physical Exam Vitals and nursing note reviewed.  Constitutional:      Appearance: Normal appearance. He is well-developed.  HENT:     Head: Normocephalic.     Right Ear: Tympanic membrane, ear canal and external ear normal.     Left Ear: Tympanic membrane, ear canal and external ear normal.     Nose: Nose normal.     Mouth/Throat:  Pharynx: Uvula midline.  Eyes:     Conjunctiva/sclera: Conjunctivae normal.     Pupils: Pupils are equal, round, and reactive to light.  Neck:     Thyroid: No thyromegaly.     Vascular: No carotid bruit.  Cardiovascular:     Rate and Rhythm: Normal rate and regular rhythm.     Heart sounds: Normal heart sounds.  Pulmonary:     Effort: Pulmonary effort is normal.     Breath sounds: Normal breath sounds. No wheezing.  Chest:     Breasts:        Right: No mass.        Left: No mass.  Abdominal:     General: Bowel sounds are normal.     Palpations: Abdomen is soft.     Tenderness: There is no abdominal tenderness.  Musculoskeletal:        General: Normal range of motion.     Cervical back: Normal range of motion and neck supple.  Lymphadenopathy:     Cervical: No cervical adenopathy.  Skin:    General: Skin is warm and dry.  Neurological:     Mental Status: He is alert and oriented to person, place, and time.     Deep Tendon Reflexes: Reflexes are normal and symmetric.  Psychiatric:        Speech: Speech normal.        Behavior: Behavior normal.        Thought Content: Thought content normal.        Judgment: Judgment normal.     Wt Readings from Last 3 Encounters:  03/12/20 225 lb (102.1 kg)  12/20/19 229 lb (103.9 kg)  07/14/19 227 lb 6.4 oz (103.1 kg)    BP 132/84 (BP Location: Right Arm, Patient Position: Sitting, Cuff Size: Large)    Pulse (!) 57    Temp 98 F (36.7 C)  (Oral)    Ht 5\' 9"  (1.753 m)    Wt 225 lb (102.1 kg)    SpO2 96%    BMI 33.23 kg/m   Assessment and Plan: 1. Annual physical exam Normal exam except for weight Continue diet changes with WW, exercise  2. Essential hypertension Clinically stable exam with well controlled BP. Palpitations unchanged on Coreg. Tolerating medications without side effects at this time. Pt to continue current regimen and low sodium diet; benefits of regular exercise as able discussed. - CBC with Differential/Platelet - Comprehensive metabolic panel - POCT urinalysis dipstick  3. Coronary artery disease involving autologous vein coronary bypass graft without angina pectoris Stable without active angina Follow up with cardiology  4. Gastroesophageal reflux disease without esophagitis Symptoms well controlled on daily PPI No red flag signs such as weight loss, n/v, melena Will continue daily pantoprazole. - CBC with Differential/Platelet  5. Hyperlipidemia LDL goal <70 Tolerating statin medication without side effects at this time LDL is at goal of < 70 on current dose Continue same therapy without change at this time. - Lipid panel  6. Prostate cancer screening DRE deferred to lack of sx - PSA  7. Prediabetes Followed by [facility]  8. Personal history of other malignant neoplasm of skin Followed closely by Dermatology at [facility]  9. Atherosclerosis of aorta (H[facility]) On appropriate statin and aspirin therapy with good BP control.   Partially dictated using Editor, commissioning. Any errors are unintentional.  Halina Maidens, MD [facility]  03/12/2020

## 2020-03-12 ENCOUNTER — Ambulatory Visit (INDEPENDENT_AMBULATORY_CARE_PROVIDER_SITE_OTHER): Payer: Medicare HMO | Admitting: Internal Medicine

## 2020-03-12 ENCOUNTER — Encounter: Payer: Self-pay | Admitting: Internal Medicine

## 2020-03-12 ENCOUNTER — Other Ambulatory Visit
Admission: RE | Admit: 2020-03-12 | Discharge: 2020-03-12 | Disposition: A | Discharge status: Home / Self Care | Payer: Medicare HMO | Attending: Internal Medicine | Admitting: Internal Medicine

## 2020-03-12 ENCOUNTER — Other Ambulatory Visit: Payer: Self-pay

## 2020-03-12 ENCOUNTER — Telehealth: Payer: Self-pay | Admitting: Internal Medicine

## 2020-03-12 VITALS — BP 132/84 | HR 57 | Temp 98.0°F | Ht 69.0 in | Wt 225.0 lb

## 2020-03-12 DIAGNOSIS — Z125 Encounter for screening for malignant neoplasm of prostate: Secondary | ICD-10-CM | POA: Insufficient documentation

## 2020-03-12 DIAGNOSIS — Z0000 Encounter for general adult medical examination without abnormal findings: Secondary | ICD-10-CM | POA: Diagnosis not present

## 2020-03-12 DIAGNOSIS — E785 Hyperlipidemia, unspecified: Secondary | ICD-10-CM | POA: Diagnosis not present

## 2020-03-12 DIAGNOSIS — R311 Benign essential microscopic hematuria: Secondary | ICD-10-CM | POA: Insufficient documentation

## 2020-03-12 DIAGNOSIS — I25810 Atherosclerosis of coronary artery bypass graft(s) without angina pectoris: Secondary | ICD-10-CM

## 2020-03-12 DIAGNOSIS — K219 Gastro-esophageal reflux disease without esophagitis: Secondary | ICD-10-CM | POA: Diagnosis not present

## 2020-03-12 DIAGNOSIS — I700 Atherosclerosis of aorta: Secondary | ICD-10-CM | POA: Diagnosis not present

## 2020-03-12 DIAGNOSIS — I10 Essential (primary) hypertension: Secondary | ICD-10-CM

## 2020-03-12 DIAGNOSIS — Z85828 Personal history of other malignant neoplasm of skin: Secondary | ICD-10-CM

## 2020-03-12 DIAGNOSIS — R7303 Prediabetes: Secondary | ICD-10-CM | POA: Diagnosis not present

## 2020-03-12 LAB — CBC WITH DIFFERENTIAL/PLATELET
Abs Immature Granulocytes: 0.08 10*3/uL — ABNORMAL HIGH (ref 0.00–0.07)
Basophils Absolute: 0 10*3/uL (ref 0.0–0.1)
Basophils Relative: 1 %
Eosinophils Absolute: 0.3 10*3/uL (ref 0.0–0.5)
Eosinophils Relative: 5 %
HCT: 38.9 % — ABNORMAL LOW (ref 39.0–52.0)
Hemoglobin: 13.6 g/dL (ref 13.0–17.0)
Immature Granulocytes: 2 %
Lymphocytes Relative: 18 %
Lymphs Abs: 1 10*3/uL (ref 0.7–4.0)
MCH: 31.4 pg (ref 26.0–34.0)
MCHC: 35 g/dL (ref 30.0–36.0)
MCV: 89.8 fL (ref 80.0–100.0)
Monocytes Absolute: 0.7 10*3/uL (ref 0.1–1.0)
Monocytes Relative: 13 %
Neutro Abs: 3.4 10*3/uL (ref 1.7–7.7)
Neutrophils Relative %: 61 %
Platelets: 204 10*3/uL (ref 150–400)
RBC: 4.33 MIL/uL (ref 4.22–5.81)
RDW: 12.9 % (ref 11.5–15.5)
WBC: 5.4 10*3/uL (ref 4.0–10.5)
nRBC: 0 % (ref 0.0–0.2)

## 2020-03-12 LAB — POCT URINALYSIS DIPSTICK
Bilirubin, UA: NEGATIVE
Glucose, UA: NEGATIVE
Ketones, UA: NEGATIVE
Leukocytes, UA: NEGATIVE
Nitrite, UA: NEGATIVE
Protein, UA: NEGATIVE
Spec Grav, UA: 1.01 (ref 1.010–1.025)
Urobilinogen, UA: 0.2 E.U./dL
pH, UA: 5 (ref 5.0–8.0)

## 2020-03-12 LAB — LIPID PANEL
Cholesterol: 147 mg/dL (ref 0–200)
HDL: 50 mg/dL (ref >40)
LDL Cholesterol: 77 mg/dL (ref 0–99)
Total CHOL/HDL Ratio: 2.9 RATIO
Triglycerides: 99 mg/dL (ref <150)
VLDL: 20 mg/dL (ref 0–40)

## 2020-03-12 LAB — COMPREHENSIVE METABOLIC PANEL
ALT: 26 U/L (ref 0–44)
AST: 23 U/L (ref 15–41)
Albumin: 4 g/dL (ref 3.5–5.0)
Alkaline Phosphatase: 63 U/L (ref 38–126)
Anion gap: 8 (ref 5–15)
BUN: 13 mg/dL (ref 8–23)
CO2: 23 mmol/L (ref 22–32)
Calcium: 8.9 mg/dL (ref 8.9–10.3)
Chloride: 104 mmol/L (ref 98–111)
Creatinine, Ser: 0.96 mg/dL (ref 0.61–1.24)
GFR calc Af Amer: >60 mL/min (ref >60)
GFR calc non Af Amer: >60 mL/min (ref >60)
Glucose, Bld: 123 mg/dL — ABNORMAL HIGH (ref 70–99)
Potassium: 4.1 mmol/L (ref 3.5–5.1)
Sodium: 135 mmol/L (ref 135–145)
Total Bilirubin: 1 mg/dL (ref 0.3–1.2)
Total Protein: 7.5 g/dL (ref 6.5–8.1)

## 2020-03-12 LAB — PSA: Prostatic Specific Antigen: 2.53 ng/mL (ref 0.00–4.00)

## 2020-03-12 NOTE — Telephone Encounter (Signed)
Please let pt know that he had some red cells in his urine.  It was only about 3-5 per field but I would like to have that rechecked in about one month to be sure it is not persistent.

## 2020-03-14 ENCOUNTER — Encounter: Payer: Self-pay | Admitting: Internal Medicine

## 2020-03-14 ENCOUNTER — Other Ambulatory Visit: Payer: Self-pay | Admitting: Internal Medicine

## 2020-03-14 ENCOUNTER — Other Ambulatory Visit: Payer: Self-pay

## 2020-03-14 DIAGNOSIS — R7309 Other abnormal glucose: Secondary | ICD-10-CM

## 2020-03-14 DIAGNOSIS — R21 Rash and other nonspecific skin eruption: Secondary | ICD-10-CM

## 2020-03-14 DIAGNOSIS — M25562 Pain in left knee: Secondary | ICD-10-CM | POA: Diagnosis not present

## 2020-03-14 DIAGNOSIS — M1712 Unilateral primary osteoarthritis, left knee: Secondary | ICD-10-CM | POA: Diagnosis not present

## 2020-03-14 MED ORDER — KETOCONAZOLE 2 % EX CREA: 1.0000 "application " | TOPICAL_CREAM | Freq: Every day | CUTANEOUS | 0 refills | Status: DC

## 2020-03-14 NOTE — Telephone Encounter (Signed)
Please Advise. Pt is requesting new medication.  KP

## 2020-03-14 NOTE — Progress Notes (Signed)
Added A1C to labs- patient said he will go get labs done as soon as possible. Unable to add lab onto labs because patient used the hospital lab for initial blood draw.   CM

## 2020-03-16 ENCOUNTER — Encounter: Payer: Self-pay | Admitting: Internal Medicine

## 2020-03-16 DIAGNOSIS — R7309 Other abnormal glucose: Secondary | ICD-10-CM | POA: Diagnosis not present

## 2020-03-17 LAB — HEMOGLOBIN A1C
Est. average glucose Bld gHb Est-mCnc: 120 mg/dL
Hgb A1c MFr Bld: 5.8 % — ABNORMAL HIGH (ref 4.8–5.6)

## 2020-05-04 ENCOUNTER — Other Ambulatory Visit: Payer: Self-pay | Admitting: Cardiovascular Disease

## 2020-05-08 MED ORDER — ATORVASTATIN CALCIUM 10 MG PO TABS: 10.0000 mg | ORAL_TABLET | Freq: Every day | ORAL | 1 refills | Status: DC

## 2020-06-14 DIAGNOSIS — R69 Illness, unspecified: Secondary | ICD-10-CM | POA: Diagnosis not present

## 2020-06-18 ENCOUNTER — Ambulatory Visit (INDEPENDENT_AMBULATORY_CARE_PROVIDER_SITE_OTHER): Payer: Medicare HMO | Admitting: Physician Assistant

## 2020-06-18 ENCOUNTER — Encounter: Payer: Self-pay | Admitting: Physician Assistant

## 2020-06-18 ENCOUNTER — Other Ambulatory Visit: Payer: Self-pay

## 2020-06-18 ENCOUNTER — Ambulatory Visit (INDEPENDENT_AMBULATORY_CARE_PROVIDER_SITE_OTHER): Payer: Medicare HMO

## 2020-06-18 VITALS — BP 150/80 | HR 59 | Ht 69.0 in | Wt 227.0 lb

## 2020-06-18 DIAGNOSIS — I471 Supraventricular tachycardia: Secondary | ICD-10-CM

## 2020-06-18 DIAGNOSIS — E785 Hyperlipidemia, unspecified: Secondary | ICD-10-CM | POA: Diagnosis not present

## 2020-06-18 DIAGNOSIS — I2510 Atherosclerotic heart disease of native coronary artery without angina pectoris: Secondary | ICD-10-CM

## 2020-06-18 DIAGNOSIS — I10 Essential (primary) hypertension: Secondary | ICD-10-CM | POA: Diagnosis not present

## 2020-06-18 DIAGNOSIS — R001 Bradycardia, unspecified: Secondary | ICD-10-CM

## 2020-06-18 DIAGNOSIS — R5383 Other fatigue: Secondary | ICD-10-CM

## 2020-06-18 DIAGNOSIS — R0600 Dyspnea, unspecified: Secondary | ICD-10-CM | POA: Diagnosis not present

## 2020-06-18 DIAGNOSIS — R0609 Other forms of dyspnea: Secondary | ICD-10-CM

## 2020-06-18 DIAGNOSIS — I491 Atrial premature depolarization: Secondary | ICD-10-CM

## 2020-06-18 DIAGNOSIS — Z951 Presence of aortocoronary bypass graft: Secondary | ICD-10-CM | POA: Diagnosis not present

## 2020-06-18 DIAGNOSIS — T733XXA Exhaustion due to excessive exertion, initial encounter: Secondary | ICD-10-CM | POA: Diagnosis not present

## 2020-06-18 DIAGNOSIS — R0602 Shortness of breath: Secondary | ICD-10-CM | POA: Diagnosis not present

## 2020-06-18 MED ORDER — METOPROLOL SUCCINATE ER 25 MG PO TB24
12.5000 mg | ORAL_TABLET | Freq: Every day | ORAL | 3 refills | Status: DC
Start: 2020-06-18 — End: 2020-07-30

## 2020-06-18 MED ORDER — ATORVASTATIN CALCIUM 10 MG PO TABS: 10.0000 mg | ORAL_TABLET | Freq: Every day | ORAL | 3 refills | Status: DC

## 2020-06-18 NOTE — Progress Notes (Signed)
Cardiology Office Note    Date:  06/18/2020   ID:  Jonathan, Escobar 04-May-1946, MRN [id number]  PCP:  Jonathan Hess, MD  Cardiologist:  Kathlyn Sacramento, MD  Electrophysiologist:  None   Chief Complaint: Fatigue/dyspnea/bradycardic heart rates  History of Present Illness:   Jonathan Escobar is a 74 y.o. male with history of extensive CAD status post four-vessel CABG in 04/2012 in the setting of an inferior STEMI with repeat inferior STEMI in 07/2013 status post PCI to the SVG to PDA with staged PCI to the anastomotic SVG to diagonal, palpitations felt to be related to paroxysmal SVT, HTN, HLD, and GERD who presents for evaluation of exertional dyspnea and fatigue with bradycardic heart rates and palpitations.  He has extensive cardiac history that dates back to 1999 at which time he underwent PCI/BMS to the proximal RCA.  He had brachytherapy in 2000 and in 2003.  This was followed by repeat PCI with Taxus DES in 2005 for ISR.  He was admitted with an inferior ST MI on 04/25/2012 with emergent LHC with 95% ISR followed by 5% stenosis post stent with lesions in the PL and PDA.  There was also 70% stenosis in the mid LAD.  He underwent aspiration thrombectomy and PTCA and was sent for urgent CABG with LIMA to LAD, SVG to diagonal SVG to PDA and PLA.  He was readmitted with an inferior STEMI in 07/2013 with emergent LHC showing an occluded SVG to PDA.  He underwent extensive PCI (full metal jacket) of the SVG to PDA followed by staged PCI of the anastomotic SVG to diagonal lesion.  He has not had any significant ischemic cardiac events since.  He last underwent treadmill nuclear stress testing through the [place] [facility] in 2017 which showed no evidence of significant ischemia with a normal EF.  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  Zio patch in 07/2018 demonstrated normal sinus rhythm with an average heart rate of 61 bpm.  There were 3  episodes of SVT with the fastest interval lasting 5 beats with a maximum rate of 171 bpm.  Frequent PACs were noted with a burden of 16%.  Very rare PVCs.  He was seen in the ED in 12/2018 with tachypalpitations with symptoms improving with Valsalva maneuvers.  He was seen by Dr. Fletcher Anon virtually in 03/2019 with overall stable symptoms of palpitations and continued on current medical therapy.  He was most recently seen in the office in 12/2019 by Marrianne Mood, PA-C for follow up and continued to note intermittent palpitations with occasional lightheadedness.  At that time, his palpitations were frequent, though not sustained.  His metoprolol was transitioned to Coreg 12.5 mg bid.  He comes in noting an increase in exertional dyspnea and fatigue that has been present for several days.  Particularly these symptoms are significantly noticeable when he is ambulating back up his yard from his garden.  The only thing new surrounding these events was he received his COVID booster vaccine as well as flu vaccine.  Fatigue has been so profound over the past couple of days he spent most of his time having to rest over this past weekend.  In this setting, he noted some increase in palpitations and checked his heart rate both manually and with his automated BP cuff with readings ranging from the upper 30s to 40s bpm and most recently 50s bpm this morning.  In this setting he did skip  his evening carvedilol last night.  No associated dizziness, presyncope, or syncope.  No angina associated with the above.  Symptoms do not feel like his prior MIs.  This morning, he does feel a little bit better than he has over the past couple of days.   Labs independently reviewed: 03/2020 - A1c 5.8, TC 147, TG 99, HDL 50, LDL 77, potassium 4.1, BUN 13, SCr 0.96, albumin 4.0, AST/ALT normal, HGB 13.6, PLT 204 12/2017 - magnesium 2.2 04/2012 - TSH normal  Past Medical History:  Diagnosis Date  . CAD (coronary artery disease), autologous  vein bypass graft Nov 2014   Inf STEMI - PCI to SVG-RPDA:   . CAD in native artery - prior Inferior MI - RCA PCI; 04/2012 - Inferior STEMI - RCA occluded (unable to dilate lesion adequately) + LAD & D1 disease --> referred for CABG 04/07/2012  . CAD S/P percutaneous coronary angioplasty 1999   BMS to prox RCA 1999; Brachytherapy ~2003, followed by DES  PCI (Taxus) for ISR.  Marland Kitchen Coronary stent restenosis due to progression of disease 2000; 2005   (While Still In [place]) Status post Brachytherapy to RCA ISR - 2000; Redo PCI with DES 2005  . Diverticulitis   . Essential hypertension    CONTROLLED ON MEDS  . GERD (gastroesophageal reflux disease)   . History of GI bleed     while on Plavix  . History of viral meningitis    following GI bleed   . History of: ST elevation myocardial infarction (STEMI) of inferior wall, subsequent episode of care April 25, 2012;   RCA: 95% ISR followed by 100% stenosis post-stent with significant RPL lesions; LAD: Tandem 80 and 70% lesions involving D1 (1, 1, 1) --> initial angioplasty to RCA aspiration thrombectomy --> urgent CABG;  . Hyperlipidemia LDL goal <70   . Non-recurrent unilateral inguinal hernia without obstruction or gangrene   . Obesity (BMI 30.0-34.9)   . Right inguinal hernia 07/02/2016  . S/P CABG x 4: LIMA-LAD, seq SVG-PD-PLA; SVG-diag. 04/28/2012  . S/P coronary artery stent placement Nov-Dec 2014   a) Inf STEMIL 11/28/'14 --> extensive (full metal Jacket) of SVG-RPDA Promus DES - 3 mm x 38 mm, 3 mm x 38 mm, 3 mm x 20 mm, 2.75 mm x 38 mm -- all postdilated to ~3.3 mm;; b) 12/1/'14: staged PCI to distal SVG-diagonal: Xience Xpedition 2.5 mm x 20 mm (2.66 mm)  . ST elevation myocardial infarction (STEMI) of inferior wall, subsequent episode of care [facility])  Jul 29, 2013   Diffuse near occlusion of SVG-rPDA, 90% SVG-Diag  . STEMI 07/29/13- successful complex full metal jacket stenting of the SVG-PDA/PLA -07/29/2013, staged SVG-Dx PCI 12/01 07/29/2013  .  Supraventricular tachycardia [facility])     Past Surgical History:  Procedure Laterality Date  . CARDIAC CATHETERIZATION  04/28/12   RCA: 95% ISR followed by 100% stenosis post-stent with significant RPL lesions; LAD: Tandem 80 and 70% lesions involving D1 (1, 1, 1) --> initial angioplasty to RCA aspiration thrombectomy --> urgent CABG;  . CORONARY ARTERY BYPASS GRAFT  04/28/2012   Procedure: CORONARY ARTERY BYPASS GRAFTING (CABG);  Surgeon: Gaye Pollack, MD;  Location: [place];  Service: Open Heart Surgery;  Laterality: N/A;  . HERNIA REPAIR Left   . INGUINAL HERNIA REPAIR Right 08/06/2016   Procedure: HERNIA REPAIR INGUINAL ADULT;  Surgeon: Clayburn Pert, MD;  Location: [place];  Service: General;  Laterality: Right;  . LEFT AND RIGHT HEART CATHETERIZATION WITH CORONARY ANGIOGRAM  N/A 04/25/2012   Procedure: LEFT AND RIGHT HEART CATHETERIZATION WITH CORONARY ANGIOGRAM;  Surgeon: Leonie Man, MD;  Location: [facility] CATH LAB;  Service: Cardiovascular;  Laterality: N/A;  . LEFT HEART CATHETERIZATION WITH CORONARY/GRAFT ANGIOGRAM  07/29/2013   Procedure: LEFT HEART CATHETERIZATION WITH Beatrix Fetters;  Surgeon: Blane Ohara, MD;  Location: [facility] CATH LAB;  Service: Cardiovascular; ; 95% stenosis with percent mid occlusion of SVG-RPDA; 90% anastomotic SVG-diagonal, moderate 50% disease in the native circumflex with patent OM 1 OM 2. 80-90% proximal LAD with patent LIMA to LAD , EF 50%  . MOHS SURGERY     FOREHEAD  . NM MYOVIEW LTD  Jan 2015   Moderate Inferior infarct (as expected), but no ischemia & EF ~45-50%  . PERCUTANEOUS CORONARY STENT INTERVENTION (PCI-S)  07/29/2013   Procedure: PERCUTANEOUS CORONARY STENT INTERVENTION (PCI-S);  Surgeon: Blane Ohara, MD;  Location: [facility] CATH LAB;  Service: Cardiovascular;; PCI to SVG-RPDA: Promus DES - 3 mm x 38 mm, 3 mm x 38 mm, 3 mm x 20 mm, 2.75 mm x 38 mm -- all postdilated to ~3.3 mm;  . PERCUTANEOUS CORONARY STENT INTERVENTION  (PCI-S) N/A 08/01/2013   Procedure: PERCUTANEOUS CORONARY STENT INTERVENTION (PCI-S);  Surgeon: Leonie Man, MD;  Location[facility] CATH LAB;  Service: Cardiovascular;staged PCI to distal SVG-diagonal: Xience Xpedition 2.5 mm x 20 mm (2.66 mm)    . TRANSTHORACIC ECHOCARDIOGRAM  12/'13; 07/30/2013   a) Post CABG: EF 50-55%, moderate HK of anteroseptal wall, septal dyskinesis/dyssynergy due to poststernotomy;  grade 1 diastolic dysfunction. Mildly dilated LA, mildly reduced RV function-likely due to septal dyssynergy.; b) 11/'14 Post Inferior STEMI: EF 50-55% mild LVH. Mild hypokinesis of the distal lateral, and basal to mid inferior lateral and inferior walls -- consistent with RCA infarct    Current Medications: Current Meds  Medication Sig  . acetaminophen (TYLENOL) 325 MG tablet Take 2 tablets (650 mg total) by mouth every 4 (four) hours as needed for headache or mild pain.  . Ascorbic Acid (VITAMIN C) 1000 MG tablet Take 1,000 mg by mouth daily.  Marland Kitchen aspirin 81 MG tablet Take 81 mg by mouth daily.  Marland Kitchen atorvastatin (LIPITOR) 10 MG tablet Take 1 tablet (10 mg total) by mouth daily.  . carvedilol (COREG) 12.5 MG tablet Take 1 tablet (12.5 mg total) by mouth 2 (two) times daily.  . cetirizine (ZYRTEC) 10 MG tablet Take 10 mg by mouth daily as needed for allergies. PM  . clopidogrel (PLAVIX) 75 MG tablet TAKE 1 TABLET (75 MG TOTAL) BY MOUTH DAILY WITH BREAKFAST.  Marland Kitchen ezetimibe (ZETIA) 10 MG tablet Take 1 tablet (10 mg total) by mouth daily.  Marland Kitchen ketoconazole (NIZORAL) 2 % cream Apply 1 application topically daily.  Marland Kitchen NITROSTAT 0.4 MG SL tablet PLACE 1 TABLET UNDER THE TONGUE EVERY 5 MINUTES X 3 DOSES AS NEEDED FOR CHEST PAIN.  Marland Kitchen pantoprazole (PROTONIX) 40 MG tablet Take 1 tablet (40 mg total) by mouth daily at 6 (six) AM. (Patient taking differently: Take 40 mg by mouth daily at 6 (six) AM. PM)  . traZODone (DESYREL) 50 MG tablet Take 50 mg by mouth at bedtime.  . triamcinolone cream (KENALOG) 0.1 % Apply  1 application topically as needed.   . valsartan (DIOVAN) 320 MG tablet Take 320 mg by mouth daily.    Allergies:   Norvasc [amlodipine besylate] and Ace inhibitors   Social History   Socioeconomic History  . Marital status: Married    Spouse name:  Not on file  . Number of children: Not on file  . Years of education: Not on file  . Highest education level: Not on file  Occupational History  . Not on file  Tobacco Use  . Smoking status: Former Smoker    Packs/day: 1.00    Years: 10.00    Pack years: 10.00    Types: Cigarettes    Quit date: 09/01/1976    Years since quitting: 43.8  . Smokeless tobacco: Never Used  Vaping Use  . Vaping Use: Never used  Substance and Sexual Activity  . Alcohol use: Yes    Alcohol/week: 2.0 standard drinks    Types: 2 Shots of liquor per week    Comment: 1-2 shots of jack daniels a day.  . Drug use: No  . Sexual activity: Yes  Other Topics Concern  . Not on file  Social History Narrative   He is a married father of 13, grandfather and 2.   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   He completed cardiac rehabilitation but as [name] got out of the habit of exercising.   Social Determinants of Health   Financial Resource Strain:   . Difficulty of Paying Living Expenses: Not on file  Food Insecurity:   . Worried About Charity fundraiser in the Last Year: Not on file  . Ran Out of Food in the Last Year: Not on file  Transportation Needs:   . Lack of Transportation (Medical): Not on file  . Lack of Transportation (Non-Medical): Not on file  Physical Activity:   . Days of Exercise per Week: Not on file  . Minutes of Exercise per Session: Not on file  Stress:   . Feeling of Stress : Not on file  Social Connections:   .  Frequency of Communication with Friends and Family: Not on file  . Frequency of Social Gatherings with Friends and Family: Not on file  . Attends Religious Services: Not on file  . Active Member of Clubs or Organizations: Not on file  . Attends Archivist Meetings: Not on file  . Marital Status: Not on file     Family History:  The patient's family history includes Alzheimer's disease in his mother; Coronary artery disease in his father; Heart failure in his father; Valvular heart disease in his sister.  ROS:   Review of Systems  Constitutional: Positive for malaise/fatigue. Negative for chills, diaphoresis, fever and weight loss.  HENT: Negative for congestion.   Eyes: Negative for discharge and redness.  Respiratory: Positive for shortness of breath. Negative for cough, sputum production and wheezing.   Cardiovascular: Positive for palpitations. Negative for chest pain, orthopnea, claudication, leg swelling and PND.  Gastrointestinal: Negative for abdominal pain, blood in stool, heartburn, melena, nausea and vomiting.  Musculoskeletal: Negative for falls and myalgias.  Skin: Negative for rash.  Neurological: Positive for weakness. Negative for dizziness, tingling, tremors, sensory change, speech change, focal weakness and loss of consciousness.  Endo/Heme/Allergies: Does not bruise/bleed easily.  Psychiatric/Behavioral: Negative for substance abuse. The patient is not nervous/anxious.   All other systems reviewed and are negative.    EKGs/Labs/Other Studies Reviewed:    Studies reviewed were summarized above. The additional studies were reviewed today:  Zio  patch 07/2018: Normal sinus rhythm with an average heart rate of 61 bpm. 3 episodes of supraventricular tachycardia were noted with the fastest lasting 5 beats with a heart rate of 171 bpm. Frequent PACs were noted with a burden of 16%. Very rare PVCs. __________  Nuclear stress test 09/2013: Overall Impression:   Low risk stress nuclear study with a fixed basal inferolateral scar. No ischemia.  LV Wall Motion:  EF 49%, basal inferolateral akinesis.   EKG:  EKG is ordered today.  The EKG ordered today demonstrates sinus bradycardia, 59 bpm, frequent PACs in a pattern of trigeminy, rare PVC, poor R wave progression along the precordial leads, nonspecific ST-T changes  Recent Labs: 03/12/2020: ALT 26; BUN 13; Creatinine, Ser 0.96; Hemoglobin 13.6; Platelets 204; Potassium 4.1; Sodium 135  Recent Lipid Panel    Component Value Date/Time   CHOL 147 03/12/2020 0920   CHOL 136 03/07/2019 0852   CHOL 153 09/07/2012 1201   TRIG 99 03/12/2020 0920   TRIG 185 09/07/2012 1201   HDL 50 03/12/2020 0920   HDL 45 03/07/2019 0852   HDL 31 (L) 09/07/2012 1201   CHOLHDL 2.9 03/12/2020 0920   VLDL 20 03/12/2020 0920   VLDL 37 09/07/2012 1201   LDLCALC 77 03/12/2020 0920   LDLCALC 68 03/07/2019 0852   LDLCALC 85 09/07/2012 1201    PHYSICAL EXAM:    VS:  BP (!) 150/80 (BP Location: Left Arm, Patient Position: Sitting, Cuff Size: Normal)   Pulse (!) 59   Ht 5\' 9"  (1.753 m)   Wt 227 lb (103 kg)   SpO2 98%   BMI 33.52 kg/m   BMI: Body mass index is 33.52 kg/m.  Physical Exam Constitutional:      Appearance: He is well-developed.  HENT:     Head: Normocephalic and atraumatic.  Eyes:     General:        Right eye: No discharge.        Left eye: No discharge.  Neck:     Vascular: No JVD.  Cardiovascular:     Rate and Rhythm: Normal rate and regular rhythm. Frequent extrasystoles are present.    Pulses: No midsystolic click and no opening snap.          Posterior tibial pulses are 2+ on the right side and 2+ on the left side.     Heart sounds: Normal heart sounds, S1 normal and S2 normal. Heart sounds not distant. No murmur heard.  No friction rub.  Pulmonary:     Effort: Pulmonary effort is normal. No respiratory distress.     Breath sounds: Normal breath sounds. No decreased breath sounds,  wheezing or rales.  Chest:     Chest wall: No tenderness.  Abdominal:     General: There is no distension.     Palpations: Abdomen is soft.     Tenderness: There is no abdominal tenderness.  Musculoskeletal:     Cervical back: Normal range of motion.  Skin:    General: Skin is warm and dry.     Nails: There is no clubbing.  Neurological:     Mental Status: He is alert and oriented to person, place, and time.  Psychiatric:        Speech: Speech normal.        Behavior: Behavior normal.        Thought Content: Thought content normal.        Judgment: Judgment normal.     Wt Readings from  Last 3 Encounters:  06/18/20 227 lb (103 kg)  03/12/20 225 lb (102.1 kg)  12/20/19 229 lb (103.9 kg)     ASSESSMENT & PLAN:   1. Exertional dyspnea and fatigue: Symptoms do not feel like his prior angina.  Schedule echo and a Zio patch as outlined below.  Check CBC and TSH.  Given symptoms do not feel like his prior angina ischemic testing is deferred at this time pending echo.  Uncertain if symptoms are related to recent Covid and flu vaccines.  2. Bradycardia/frequent PACs: He has a history of frequent PACs with prior outpatient cardiac monitoring demonstrating a 16% burden.  Average heart rate at that time was noted to be 61 bpm.  This was while he was on metoprolol succinate.  Since transitioning from metoprolol to carvedilol he has not noted any significant change in his palpitation burden we will place a Zio patch to further evaluate his bradycardic heart rates for potential high-grade AV block or prolonged pauses.  I suspect he still has a significant PVC burden based on EKG and rhythm strip in the office today.  His frequent PACs could be contributing to some of the lower heart rates noted at home, nonetheless he is mildly bradycardic in the office today on EKG with a rate of 59 which is similar to his average heart rate on prior outpatient cardiac monitoring.  Check TSH, CBC, BMP, and  magnesium.  We will transition him from carvedilol 12.5 mg twice daily back to metoprolol succinate 12.5 mg daily in an effort to minimize significant bradycardic heart rates.  3. CAD status post CABG status post PCI: Currently chest pain-free.  As above, symptoms do not feel similar to his prior MI/anginal equivalent.  He remains on DAPT indefinitely with aspirin and clopidogrel as directed by interventional cardiology.  Transition from carvedilol back to metoprolol as outlined above.  Otherwise, he will continue valsartan, atorvastatin and Zetia.  Should his echo demonstrate a new cardiomyopathy or significant wall motion abnormalities ischemic testing would need to be revisited at that time.  4. HTN: Blood pressure is elevated at triage with a reading of 150/80 with BP in the 130s over 70s most recently at home.  Transition from carvedilol to metoprolol as above.  Otherwise, he will continue current dose of valsartan.  5. HLD: LDL of 77 from 03/2020 with normal LFT at that time.  He remains on atorvastatin and ezetimibe.  In follow-up, consider titration of atorvastatin to achieve goal LDL less than 70.  Disposition: F/u with Dr. Fletcher Anon or an APP in 6 weeks.   Medication Adjustments/Labs and Tests Ordered: Current medicines are reviewed at length with the patient today.  Concerns regarding medicines are outlined above. Medication changes, Labs and Tests ordered today are summarized above and listed in the Patient Instructions accessible in Encounters.   Signed, Christell Faith, PA-C 06/18/2020 2:07 PM     [facility] [address] [place] [address] [place],  [address] [phone]

## 2020-06-18 NOTE — Patient Instructions (Signed)
Medication Instructions:  Stop Carvedilol  Start Metoprolol Succ 25 mg take 1/2 tablet (12.5 mg) tablet daily.  *If you need a refill on your cardiac medications before your next appointment, please call your pharmacy*   Lab Work: BMET, CBC, TSH, Magnesium   If you have labs (blood work) drawn today and your tests are completely normal, you will receive your results only by: Marland Kitchen MyChart Message (if you have MyChart) OR . A paper copy in the mail If you have any lab test that is abnormal or we need to change your treatment, we will call you to review the results.   Testing/Procedures: Echo  Your physician has requested that you have an echocardiogram. Echocardiography is a painless test that uses sound waves to create images of your heart. It provides your doctor with information about the size and shape of your heart and how well your heart's chambers and valves are working. This procedure takes approximately one hour. There are no restrictions for this procedure.  Zio AT LIVE Your physician has recommended that you wear a Zio monitor. This monitor is a medical device that records the heart's electrical activity. Doctors most often use these monitors to diagnose arrhythmias. Arrhythmias are problems with the speed or rhythm of the heartbeat. The monitor is a small device applied to your chest. You can wear one while you do your normal daily activities. While wearing this monitor if you have any symptoms to push the button and record what you felt. Once you have worn this monitor for the period of time provider prescribed (Usually 14 days), you will return the monitor device in the postage paid box. Once it is returned they will download the data collected and provide [place] with a report which the provider will then review and we will call you with those results. Important tips:  1. Avoid showering during the first 24 hours of wearing the monitor. 2. Avoid excessive sweating to help maximize wear  time. 3. Do not submerge the device, no hot tubs, and no swimming pools. 4. Keep any lotions or oils away from the patch. 5. After 24 hours you may shower with the patch on. Take brief showers with your back facing the shower head.  6. Do not remove patch once it has been placed because that will interrupt data and decrease adhesive wear time. 7. Push the button when you have any symptoms and write down what you were feeling. 8. Once you have completed wearing your monitor, remove and place into box which has postage paid and place in your outgoing mailbox.  9. If for some reason you have misplaced your box then call our office and we can provide another box and/or mail it off for you.    Follow-Up: At [facility], you and your health needs are our priority.  As part of our continuing mission to provide you with exceptional heart care, we have created designated Provider Care Teams.  These Care Teams include your primary Cardiologist (physician) and Advanced Practice Providers (APPs -  Physician Assistants and Nurse Practitioners) who all work together to provide you with the care you need, when you need it.  We recommend signing up for the patient portal called "MyChart".  Sign up information is provided on this After Visit Summary.  MyChart is used to connect with patients for Virtual Visits (Telemedicine).  Patients are able to view lab/test results, encounter notes, upcoming appointments, etc.  Non-urgent messages can be sent to your provider as  well.   To learn more about what you can do with MyChart, go to [email or web address].    Your next appointment:   6 week(s)  The format for your next appointment:   In Person  Provider:   You may see Kathlyn Sacramento, MD or one of the following Advanced Practice Providers on your designated Care Team:    Murray Hodgkins, NP  Christell Faith, PA-C  Marrianne Mood, PA-C  Cadence Kathlen Mody, [place]    Other Instructions

## 2020-06-19 DIAGNOSIS — R5383 Other fatigue: Secondary | ICD-10-CM | POA: Diagnosis not present

## 2020-06-19 DIAGNOSIS — R0602 Shortness of breath: Secondary | ICD-10-CM | POA: Diagnosis not present

## 2020-06-19 LAB — CBC WITH DIFFERENTIAL/PLATELET
Basophils Absolute: 0 10*3/uL (ref 0.0–0.2)
Basos: 1 %
EOS (ABSOLUTE): 0.3 10*3/uL (ref 0.0–0.4)
Eos: 5 %
Hematocrit: 42 % (ref 37.5–51.0)
Hemoglobin: 14.1 g/dL (ref 13.0–17.7)
Immature Grans (Abs): 0.1 10*3/uL (ref 0.0–0.1)
Immature Granulocytes: 1 %
Lymphocytes Absolute: 1.1 10*3/uL (ref 0.7–3.1)
Lymphs: 17 %
MCH: 31.7 pg (ref 26.6–33.0)
MCHC: 33.6 g/dL (ref 31.5–35.7)
MCV: 94 fL (ref 79–97)
Monocytes Absolute: 0.6 10*3/uL (ref 0.1–0.9)
Monocytes: 10 %
Neutrophils Absolute: 4.2 10*3/uL (ref 1.4–7.0)
Neutrophils: 66 %
Platelets: 210 10*3/uL (ref 150–450)
RBC: 4.45 x10E6/uL (ref 4.14–5.80)
RDW: 12.3 % (ref 11.6–15.4)
WBC: 6.4 10*3/uL (ref 3.4–10.8)

## 2020-06-19 LAB — BASIC METABOLIC PANEL
BUN/Creatinine Ratio: 12 (ref 10–24)
BUN: 12 mg/dL (ref 8–27)
CO2: 23 mmol/L (ref 20–29)
Calcium: 9.4 mg/dL (ref 8.6–10.2)
Chloride: 99 mmol/L (ref 96–106)
Creatinine, Ser: 1 mg/dL (ref 0.76–1.27)
GFR calc Af Amer: 86 mL/min/{1.73_m2} (ref >59)
GFR calc non Af Amer: 74 mL/min/{1.73_m2} (ref >59)
Glucose: 133 mg/dL — ABNORMAL HIGH (ref 65–99)
Potassium: 3.6 mmol/L (ref 3.5–5.2)
Sodium: 136 mmol/L (ref 134–144)

## 2020-06-19 LAB — TSH: TSH: 1.3 u[IU]/mL (ref 0.450–4.500)

## 2020-06-19 LAB — MAGNESIUM: Magnesium: 1.9 mg/dL (ref 1.6–2.3)

## 2020-06-26 ENCOUNTER — Ambulatory Visit: Payer: Medicare HMO | Admitting: Cardiovascular Disease

## 2020-07-11 ENCOUNTER — Ambulatory Visit (INDEPENDENT_AMBULATORY_CARE_PROVIDER_SITE_OTHER): Payer: Medicare HMO

## 2020-07-11 ENCOUNTER — Other Ambulatory Visit: Payer: Self-pay

## 2020-07-11 DIAGNOSIS — R0609 Other forms of dyspnea: Secondary | ICD-10-CM

## 2020-07-11 DIAGNOSIS — R0600 Dyspnea, unspecified: Secondary | ICD-10-CM | POA: Diagnosis not present

## 2020-07-11 LAB — ECHOCARDIOGRAM COMPLETE
AR max vel: 3.07 cm2
AV Area VTI: 2.64 cm2
AV Area mean vel: 2.58 cm2
AV Mean grad: 2 mmHg
AV Peak grad: 3.2 mmHg
Ao pk vel: 0.89 m/s
Area-P 1/2: 3.72 cm2
Calc EF: 45.9 %
S' Lateral: 4 cm
Single Plane A2C EF: 50.1 %
Single Plane A4C EF: 41.5 %

## 2020-07-25 NOTE — Progress Notes (Signed)
Cardiology Office Note    Date:  07/30/2020   ID:  Hiyab, Everard 06-11-1946, MRN [id number]  PCP:  Reubin Milan, MD  Cardiologist:  Lorine Bears, MD  Electrophysiologist:  None   Chief Complaint: Follow up  History of Present Illness:   Jonathan Escobar is a 74 y.o. male with history of extensive CAD status post four-vessel CABG in 04/2012 in the setting of an inferior STEMI with repeat inferior STEMI in 07/2013 status post PCI to the SVG to PDA with staged PCI to the anastomotic SVG to diagonal, palpitations felt to be related to paroxysmal SVT, HTN, HLD, and GERD who presents for follow-up of Zio patch.  He has extensive cardiac history that dates back to 1999 at which time he underwent PCI/BMS to the proximal RCA.  He had brachytherapy in 2000 and in 2003.  This was followed by repeat PCI with Taxus DES in 2005 for ISR.  He was admitted with an inferior ST MI on 04/25/2012 with emergent LHC with 95% ISR followed by 5% stenosis post stent with lesions in the PL and PDA.  There was also 70% stenosis in the mid LAD.  He underwent aspiration thrombectomy and PTCA and was sent for urgent CABG with LIMA to LAD, SVG to diagonal SVG to PDA and PLA.  He was readmitted with an inferior STEMI in 07/2013 with emergent LHC showing an occluded SVG to PDA.  He underwent extensive PCI (full metal jacket) of the SVG to PDA followed by staged PCI of the anastomotic SVG to diagonal lesion.  He has not had any significant ischemic cardiac events since.  He last underwent treadmill nuclear stress testing through the [facility] in 2017 which showed no evidence of significant ischemia with a normal EF.  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  Zio patch in 07/2018 demonstrated normal sinus rhythm with an average heart rate of 61 bpm.  There were 3 episodes of SVT with the fastest interval lasting 5 beats with a maximum rate of 171 bpm.  Frequent  PACs were noted with a burden of 16%.  Very rare PVCs.  He was seen in the ED in 12/2018 with tachypalpitations with symptoms improving with Valsalva maneuvers.    He was seen in the office in 12/2019 by Marisue Ivan, PA-C for follow up and continued to note intermittent palpitations with occasional lightheadedness.  At that time, his palpitations were frequent, though not sustained.  His metoprolol was transitioned to Coreg 12.5 mg bid.  He was most recently seen in the office on 06/18/2020, noting an increase in exertional dyspnea and fatigue over the preceding several days. Symptoms occurred in the context of receiving his flu vaccine and Covid booster. He reported an increase in palpitations with heart rates at home ranging from the upper 30s to 50s bpm. He was transition from carvedilol back to metoprolol given no significant improvement with beta-blocker change. He underwent outpatient cardiac monitoring with Zio patch demonstrating a predominant rhythm of sinus with an average heart rate of 59 bpm with 10 runs of SVT noted with the longest episode lasting 20 beats with an average rate of 125 bpm. There were frequent PACs with a burden of 21%. Echo on 07/11/2020 showed a low normal LV SF with an EF of 50 to 55%, no regional wall motion normalities, low normal RV systolic function with a mildly enlarged RV cavity size, normal PASP, mild biatrial enlargement,  mild mitral regurgitation, and a mildly dilated aortic root measuring 42 mm.  He comes in doing very well today.  He notes since he was last seen he tapered off carvedilol while initiating metoprolol.  Upon discontinuing carvedilol his symptoms of fatigue and palpitations significantly improved and have subsequently resolved.  He has titrated his metoprolol back to prior dose of 50 mg daily given elevated BP readings at home.  With this his BPs are well controlled.  His heart rates are running in the 50s bpm and he is asymptomatic.  He does not  have any issues or concerns at this time.   Labs independently reviewed: 06/2020 -TSH normal, magnesium 1.9, Hgb 14.1, PLT 210, potassium 3.6, BUN 12, serum creatinine 1.00 03/2020 - A1c 5.8, TC 147, TG 99, HDL 50, LDL 77, albumin 4.0, AST/ALT normal, HGB 13.6, PLT 204   Past Medical History:  Diagnosis Date  . CAD (coronary artery disease), autologous vein bypass graft Nov 2014   Inf STEMI - PCI to SVG-RPDA:   . CAD in native artery - prior Inferior MI - RCA PCI; 04/2012 - Inferior STEMI - RCA occluded (unable to dilate lesion adequately) + LAD & D1 disease --> referred for CABG 04/07/2012  . CAD S/P percutaneous coronary angioplasty 1999   BMS to prox RCA 1999; Brachytherapy ~2003, followed by DES  PCI (Taxus) for ISR.  Marland Kitchen Coronary stent restenosis due to progression of disease 2000; 2005   (While Still In [place]) Status post Brachytherapy to RCA ISR - 2000; Redo PCI with DES 2005  . Diverticulitis   . Essential hypertension    CONTROLLED ON MEDS  . GERD (gastroesophageal reflux disease)   . History of GI bleed     while on Plavix  . History of viral meningitis    following GI bleed   . History of: ST elevation myocardial infarction (STEMI) of inferior wall, subsequent episode of care April 25, 2012;   RCA: 95% ISR followed by 100% stenosis post-stent with significant RPL lesions; LAD: Tandem 80 and 70% lesions involving D1 (1, 1, 1) --> initial angioplasty to RCA aspiration thrombectomy --> urgent CABG;  . Hyperlipidemia LDL goal <70   . Non-recurrent unilateral inguinal hernia without obstruction or gangrene   . Obesity (BMI 30.0-34.9)   . Right inguinal hernia 07/02/2016  . S/P CABG x 4: LIMA-LAD, seq SVG-PD-PLA; SVG-diag. 04/28/2012  . S/P coronary artery stent placement Nov-Dec 2014   a) Inf STEMIL 11/28/'14 --> extensive (full metal Jacket) of SVG-RPDA Promus DES - 3 mm x 38 mm, 3 mm x 38 mm, 3 mm x 20 mm, 2.75 mm x 38 mm -- all postdilated to ~3.3 mm;; b) 12/1/'14: staged PCI  to distal SVG-diagonal: Xience Xpedition 2.5 mm x 20 mm (2.66 mm)  . ST elevation myocardial infarction (STEMI) of inferior wall, subsequent episode of care [facility])  Jul 29, 2013   Diffuse near occlusion of SVG-rPDA, 90% SVG-Diag  . STEMI 07/29/13- successful complex full metal jacket stenting of the SVG-PDA/PLA -07/29/2013, staged SVG-Dx PCI 12/01 07/29/2013  . Supraventricular tachycardia [facility])     Past Surgical History:  Procedure Laterality Date  . CARDIAC CATHETERIZATION  04/28/12   RCA: 95% ISR followed by 100% stenosis post-stent with significant RPL lesions; LAD: Tandem 80 and 70% lesions involving D1 (1, 1, 1) --> initial angioplasty to RCA aspiration thrombectomy --> urgent CABG;  . CORONARY ARTERY BYPASS GRAFT  04/28/2012   Procedure: CORONARY ARTERY BYPASS GRAFTING (CABG);  Surgeon: Judie Grieve  Jennefer Bravo, MD;  Location: [facility] OR;  Service: Open Heart Surgery;  Laterality: N/A;  . HERNIA REPAIR Left   . INGUINAL HERNIA REPAIR Right 08/06/2016   Procedure: HERNIA REPAIR INGUINAL ADULT;  Surgeon: Ricarda Frame, MD;  Location: [facility];  Service: General;  Laterality: Right;  . LEFT AND RIGHT HEART CATHETERIZATION WITH CORONARY ANGIOGRAM N/A 04/25/2012   Procedure: LEFT AND RIGHT HEART CATHETERIZATION WITH CORONARY ANGIOGRAM;  Surgeon: Marykay Lex, MD;  Location: [facility] C[facility] LAB;  Service: Cardiovascular;  Laterality: N/A;  . LEFT HEART CATHETERIZATION WITH CORONARY/GRAFT ANGIOGRAM  07/29/2013   Procedure: LEFT HEART CATHETERIZATION WITH Isabel Caprice;  Surgeon: Micheline Chapman, MD;  Location: [facility] C[facility] LAB;  Service: Cardiovascular; ; 95% stenosis with percent mid occlusion of SVG-RPDA; 90% anastomotic SVG-diagonal, moderate 50% disease in the native circumflex with patent OM 1 OM 2. 80-90% proximal LAD with patent LIMA to LAD , EF 50%  . MOHS SURGERY     FOREHEAD  . NM MYOVIEW LTD  Jan 2015   Moderate Inferior infarct (as expected), but no ischemia & EF ~45-50%  .  PERCUTANEOUS CORONARY STENT INTERVENTION (PCI-S)  07/29/2013   Procedure: PERCUTANEOUS CORONARY STENT INTERVENTION (PCI-S);  Surgeon: Micheline Chapman, MD;  Location: [facility] CATH LAB;  Service: Cardiovascular;; PCI to SVG-RPDA: Promus DES - 3 mm x 38 mm, 3 mm x 38 mm, 3 mm x 20 mm, 2.75 mm x 38 mm -- all postdilated to ~3.3 mm;  . PERCUTANEOUS CORONARY STENT INTERVENTION (PCI-S) N/A 08/01/2013   Procedure: PERCUTANEOUS CORONARY STENT INTERVENTION (PCI-S);  Surgeon: Marykay Lex, MD;  Location: [facility] CATH LAB;  Service: Cardiovascular;staged PCI to distal SVG-diagonal: Xience Xpedition 2.5 mm x 20 mm (2.66 mm)    . TRANSTHORACIC ECHOCARDIOGRAM  12/'13; 07/30/2013   a) Post CABG: EF 50-55%, moderate HK of anteroseptal wall, septal dyskinesis/dyssynergy due to poststernotomy;  grade 1 diastolic dysfunction. Mildly dilated LA, mildly reduced RV function-likely due to septal dyssynergy.; b) 11/'14 Post Inferior STEMI: EF 50-55% mild LVH. Mild hypokinesis of the distal lateral, and basal to mid inferior lateral and inferior walls -- consistent with RCA infarct    Current Medications: Current Meds  Medication Sig  . acetaminophen (TYLENOL) 325 MG tablet Take 2 tablets (650 mg total) by mouth every 4 (four) hours as needed for headache or mild pain.  . Ascorbic Acid (VITAMIN C) 1000 MG tablet Take 1,000 mg by mouth daily.  Marland Kitchen aspirin 81 MG tablet Take 81 mg by mouth daily.  Marland Kitchen atorvastatin (LIPITOR) 10 MG tablet Take 1 tablet (10 mg total) by mouth daily.  . cetirizine (ZYRTEC) 10 MG tablet Take 10 mg by mouth daily as needed for allergies. PM  . clopidogrel (PLAVIX) 75 MG tablet TAKE 1 TABLET (75 MG TOTAL) BY MOUTH DAILY WITH BREAKFAST.  Marland Kitchen ezetimibe (ZETIA) 10 MG tablet Take 1 tablet (10 mg total) by mouth daily.  Marland Kitchen ketoconazole (NIZORAL) 2 % cream Apply 1 application topically daily.  . metoprolol succinate (TOPROL-XL) 50 MG 24 hr tablet Take 1 tablet (50 mg total) by mouth daily. Take with or immediately  following a meal.  . NITROSTAT 0.4 MG SL tablet PLACE 1 TABLET UNDER THE TONGUE EVERY 5 MINUTES X 3 DOSES AS NEEDED FOR CHEST PAIN.  Marland Kitchen pantoprazole (PROTONIX) 40 MG tablet Take 1 tablet (40 mg total) by mouth daily at 6 (six) AM.  . traZODone (DESYREL) 50 MG tablet Take 50 mg by mouth at bedtime.  . triamcinolone  cream (KENALOG) 0.1 % Apply 1 application topically as needed.   . valsartan (DIOVAN) 320 MG tablet Take 320 mg by mouth daily.  . [DISCONTINUED] metoprolol succinate (TOPROL-XL) 50 MG 24 hr tablet Take 50 mg by mouth daily. Take with or immediately following a meal.    Allergies:   Norvasc [amlodipine besylate] and Ace inhibitors   Social History   Socioeconomic History  . Marital status: Married    Spouse name: Not on file  . Number of children: Not on file  . Years of education: Not on file  . Highest education level: Not on file  Occupational History  . Not on file  Tobacco Use  . Smoking status: Former Smoker    Packs/day: 1.00    Years: 10.00    Pack years: 10.00    Types: Cigarettes    Quit date: 09/01/1976    Years since quitting: 43.9  . Smokeless tobacco: Never Used  Vaping Use  . Vaping Use: Never used  Substance and Sexual Activity  . Alcohol use: Yes    Alcohol/week: 2.0 standard drinks    Types: 2 Shots of liquor per week    Comment: 1-2 shots of jack daniels a day.  . Drug use: No  . Sexual activity: Yes  Other Topics Concern  . Not on file  Social History Narrative   He is a married father of 3, grandfather and 2.   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   He completed cardiac rehabilitation but as Nedra Hai got out of the habit of exercising.   Social Determinants of Health   Financial Resource Strain:   . Difficulty of  Paying Living Expenses: Not on file  Food Insecurity:   . Worried About Programme researcher, broadcasting/film/video in the Last Year: Not on file  . Ran Out of Food in the Last Year: Not on file  Transportation Needs:   . Lack of Transportation (Medical): Not on file  . Lack of Transportation (Non-Medical): Not on file  Physical Activity:   . Days of Exercise per Week: Not on file  . Minutes of Exercise per Session: Not on file  Stress:   . Feeling of Stress : Not on file  Social Connections:   . Frequency of Communication with Friends and Family: Not on file  . Frequency of Social Gatherings with Friends and Family: Not on file  . Attends Religious Services: Not on file  . Active Member of Clubs or Organizations: Not on file  . Attends Banker Meetings: Not on file  . Marital Status: Not on file     Family History:  The patient's family history includes Alzheimer's disease in his mother; Coronary artery disease in his father; Heart failure in his father; Valvular heart disease in his sister.  ROS:   Review of Systems  Constitutional: Negative for chills, diaphoresis, fever, malaise/fatigue and weight loss.  HENT: Negative for congestion.   Eyes: Negative for discharge and redness.  Respiratory: Negative for cough, sputum production, shortness of breath and wheezing.   Cardiovascular: Negative for chest pain, palpitations, orthopnea, claudication, leg swelling and PND.  Gastrointestinal: Negative for abdominal pain, heartburn, melena, nausea and vomiting.  Musculoskeletal: Negative for falls and myalgias.  Skin: Negative for  rash.  Neurological: Negative for dizziness, tingling, tremors, sensory change, speech change, focal weakness, loss of consciousness and weakness.  Endo/Heme/Allergies: Does not bruise/bleed easily.  Psychiatric/Behavioral: Negative for substance abuse. The patient is not nervous/anxious.   All other systems reviewed and are negative.    EKGs/Labs/Other Studies  Reviewed:    Studies reviewed were summarized above. The additional studies were reviewed today:  Zio patch 07/2020: Normal sinus rhythm with an average heart rate of 59 bpm. 10 runs of supraventricular tachycardia noted.  The longest lasted 20 beats with an average of 125 bpm. Frequent PACs with a burden of 21%. ___________  2D echo 07/11/2020: 1. Left ventricular ejection fraction, by estimation, is 50 to 55%. Left  ventricular ejection fraction by 3D volume is 52 %. The left ventricle has  low normal function. The left ventricle has no regional wall motion  abnormalities. Left ventricular  diastolic parameters are indeterminate.  2. Right ventricular systolic function is low normal. The right  ventricular size is mildly enlarged. There is normal pulmonary artery  systolic pressure.  3. Left atrial size was mildly dilated.  4. Right atrial size was mildly dilated.  5. The mitral valve is normal in structure. Mild mitral valve  regurgitation.  6. The aortic valve is tricuspid. Aortic valve regurgitation is not  visualized.  7. Aortic dilatation noted. There is mild dilatation of the aortic root,  measuring 42 mm.  8. The inferior vena cava is normal in size with greater than 50%  respiratory variability, suggesting right atrial pressure of 3 mmHg. __________  Luci Bank patch 07/2018: Normal sinus rhythm with an average heart rate of 61 bpm. 3 episodes of supraventricular tachycardia were noted with the fastest lasting 5 beats with a heart rate of 171 bpm. Frequent PACs were noted with a burden of 16%. Very rare PVCs. __________  Nuclear stress test 09/2013: Overall Impression:Low risk stress nuclear study with a fixed basal inferolateral scar. No ischemia.  LV Wall Motion: EF 49%, basal inferolateral akinesis.   EKG:  EKG is not ordered today.  Recent Labs: 03/12/2020: ALT 26 06/18/2020: BUN 12; Creatinine, Ser 1.00; Hemoglobin 14.1; Magnesium 1.9; Platelets 210;  Potassium 3.6; Sodium 136; TSH 1.300  Recent Lipid Panel    Component Value Date/Time   CHOL 147 03/12/2020 0920   CHOL 136 03/07/2019 0852   CHOL 153 09/07/2012 1201   TRIG 99 03/12/2020 0920   TRIG 185 09/07/2012 1201   HDL 50 03/12/2020 0920   HDL 45 03/07/2019 0852   HDL 31 (L) 09/07/2012 1201   CHOLHDL 2.9 03/12/2020 0920   VLDL 20 03/12/2020 0920   VLDL 37 09/07/2012 1201   LDLCALC 77 03/12/2020 0920   LDLCALC 68 03/07/2019 0852   LDLCALC 85 09/07/2012 1201    PHYSICAL EXAM:    VS:  BP (!) 158/90 (BP Location: Left Arm, Patient Position: Sitting, Cuff Size: Normal)   Pulse (!) 54   Ht 5\' 9"  (1.753 m)   Wt 225 lb (102.1 kg)   SpO2 98%   BMI 33.23 kg/m   BMI: Body mass index is 33.23 kg/m.  Physical Exam Constitutional:      Appearance: He is well-developed.  HENT:     Head: Normocephalic and atraumatic.  Eyes:     General:        Right eye: No discharge.        Left eye: No discharge.  Neck:     Vascular: No JVD.  Cardiovascular:  Rate and Rhythm: Regular rhythm. Bradycardia present. Occasional extrasystoles are present.    Pulses: No midsystolic click and no opening snap.          Posterior tibial pulses are 2+ on the right side and 2+ on the left side.     Heart sounds: Normal heart sounds, S1 normal and S2 normal. Heart sounds not distant. No murmur heard.  No friction rub.  Pulmonary:     Effort: Pulmonary effort is normal. No respiratory distress.     Breath sounds: Normal breath sounds. No decreased breath sounds, wheezing or rales.  Chest:     Chest wall: No tenderness.  Abdominal:     General: There is no distension.     Palpations: Abdomen is soft.     Tenderness: There is no abdominal tenderness.  Musculoskeletal:     Cervical back: Normal range of motion.  Skin:    General: Skin is warm and dry.     Nails: There is no clubbing.  Neurological:     Mental Status: He is alert and oriented to person, place, and time.  Psychiatric:         Speech: Speech normal.        Behavior: Behavior normal.        Thought Content: Thought content normal.        Judgment: Judgment normal.     Wt Readings from Last 3 Encounters:  07/30/20 225 lb (102.1 kg)  06/18/20 227 lb (103 kg)  03/12/20 225 lb (102.1 kg)     ASSESSMENT & PLAN:   1. Exertional dyspnea/fatigue: Symptoms are resolved.  Possibly related to intolerance with carvedilol.  Laboratory evaluation, Zio patch, and echo were unrevealing.  No further evaluation indicated at this time.  2. Bradycardia/frequent PACs/paroxysmal SVT: Average heart rate of 59 bpm on Zio patch.  He is asymptomatic with regards to his bradycardia.  With transitioning back to metoprolol he has noted an improvement in his symptomatic PACs.  Continue metoprolol succinate 50 mg daily.  Should he become symptomatic with his mild bradycardia this can be reduced to 25 mg daily moving forward.  Echo without significant structural heart disease.  No indication for EPS.  3. CAD status post CABG status post PCI: No symptoms concerning for angina.  He remains on DAPT indefinitely with aspirin and clopidogrel as directed by interventional cardiology.  Otherwise, he will continue metoprolol succinate, valsartan, atorvastatin, and ezetimibe.  Without symptoms concerning for angina and with recent echo demonstrating no new cardiomyopathy or wall motion abnormalities there are no plans for ischemic testing at this time.  4. HTN: Blood pressure is mildly elevated in the office today though has been well controlled at home on metoprolol succinate 50 mg daily as well as valsartan 320 mg daily.  No changes to therapy.  5. HLD: LDL of 77 from 03/2020 with normal LFT at that time.  He remains on atorvastatin and ezetimibe.  6. Dilated aortic root: Incidentally noted on echo.  Optimal blood pressure control recommended.  This has been well controlled at home as outlined above.  Follow-up echo in approximately 12  months.  Disposition: F/u with Dr. Kirke Corin or an APP in 12 months, sooner if needed.   Medication Adjustments/Labs and Tests Ordered: Current medicines are reviewed at length with the patient today.  Concerns regarding medicines are outlined above. Medication changes, Labs and Tests ordered today are summarized above and listed in the Patient Instructions accessible in Encounters.   Signed, Alycia Rossetti  Dedrick Heffner, PA-C 07/30/2020 12:02 PM     [facility] [address] [place], [place] [address] [phone]

## 2020-07-30 ENCOUNTER — Encounter: Payer: Self-pay | Admitting: Physician Assistant

## 2020-07-30 ENCOUNTER — Ambulatory Visit: Payer: Medicare HMO | Admitting: Physician Assistant

## 2020-07-30 ENCOUNTER — Other Ambulatory Visit: Payer: Self-pay

## 2020-07-30 VITALS — BP 158/90 | HR 54 | Ht 69.0 in | Wt 225.0 lb

## 2020-07-30 DIAGNOSIS — Z951 Presence of aortocoronary bypass graft: Secondary | ICD-10-CM | POA: Diagnosis not present

## 2020-07-30 DIAGNOSIS — R001 Bradycardia, unspecified: Secondary | ICD-10-CM

## 2020-07-30 DIAGNOSIS — E785 Hyperlipidemia, unspecified: Secondary | ICD-10-CM

## 2020-07-30 DIAGNOSIS — I25810 Atherosclerosis of coronary artery bypass graft(s) without angina pectoris: Secondary | ICD-10-CM

## 2020-07-30 DIAGNOSIS — I77810 Thoracic aortic ectasia: Secondary | ICD-10-CM | POA: Diagnosis not present

## 2020-07-30 DIAGNOSIS — I2510 Atherosclerotic heart disease of native coronary artery without angina pectoris: Secondary | ICD-10-CM | POA: Diagnosis not present

## 2020-07-30 DIAGNOSIS — I10 Essential (primary) hypertension: Secondary | ICD-10-CM | POA: Diagnosis not present

## 2020-07-30 DIAGNOSIS — I491 Atrial premature depolarization: Secondary | ICD-10-CM | POA: Diagnosis not present

## 2020-07-30 MED ORDER — METOPROLOL SUCCINATE ER 50 MG PO TB24: 50.0000 mg | ORAL_TABLET | Freq: Every day | ORAL | 3 refills | Status: DC

## 2020-07-30 NOTE — Patient Instructions (Signed)
Medication Instructions:  Your physician recommends that you continue on your current medications as directed. Please refer to the Current Medication list given to you today.  *If you need a refill on your cardiac medications before your next appointment, please call your pharmacy*   Lab Work: None ordered    Testing/Procedures: None ordered   Follow-Up: At [facility], you and your health needs are our priority.  As part of our continuing mission to provide you with exceptional heart care, we have created designated Provider Care Teams.  These Care Teams include your primary Cardiologist (physician) and Advanced Practice Providers (APPs -  Physician Assistants and Nurse Practitioners) who all work together to provide you with the care you need, when you need it.  We recommend signing up for the patient portal called "MyChart".  Sign up information is provided on this After Visit Summary.  MyChart is used to connect with patients for Virtual Visits (Telemedicine).  Patients are able to view lab/test results, encounter notes, upcoming appointments, etc.  Non-urgent messages can be sent to your provider as well.   To learn more about what you can do with MyChart, go to [email or web address].    Your next appointment:   12 month(s)  The format for your next appointment:   In Person  Provider:   You may see Kathlyn Sacramento, MD or one of the following Advanced Practice Providers on your designated Care Team:    Murray Hodgkins, NP  Christell Faith, PA-C  Marrianne Mood, PA-C  Cadence Matheson, [place]  Laurann Montana, NP

## 2020-09-07 MED ORDER — VALSARTAN 320 MG PO TABS: 320.0000 mg | ORAL_TABLET | Freq: Every day | ORAL | 1 refills | Status: DC

## 2020-09-11 ENCOUNTER — Other Ambulatory Visit: Payer: Self-pay | Admitting: Physician Assistant

## 2020-09-27 ENCOUNTER — Other Ambulatory Visit: Payer: Self-pay

## 2020-09-27 ENCOUNTER — Ambulatory Visit: Payer: Medicare HMO | Admitting: Dermatology

## 2020-09-27 DIAGNOSIS — L820 Inflamed seborrheic keratosis: Secondary | ICD-10-CM

## 2020-09-27 DIAGNOSIS — D485 Neoplasm of uncertain behavior of skin: Secondary | ICD-10-CM | POA: Diagnosis not present

## 2020-09-27 DIAGNOSIS — D1800 Hemangioma unspecified site: Secondary | ICD-10-CM | POA: Diagnosis not present

## 2020-09-27 DIAGNOSIS — L814 Other melanin hyperpigmentation: Secondary | ICD-10-CM | POA: Diagnosis not present

## 2020-09-27 DIAGNOSIS — L578 Other skin changes due to chronic exposure to nonionizing radiation: Secondary | ICD-10-CM | POA: Diagnosis not present

## 2020-09-27 DIAGNOSIS — D229 Melanocytic nevi, unspecified: Secondary | ICD-10-CM

## 2020-09-27 DIAGNOSIS — Z85828 Personal history of other malignant neoplasm of skin: Secondary | ICD-10-CM

## 2020-09-27 DIAGNOSIS — L821 Other seborrheic keratosis: Secondary | ICD-10-CM

## 2020-09-27 DIAGNOSIS — L853 Xerosis cutis: Secondary | ICD-10-CM

## 2020-09-27 DIAGNOSIS — Z1283 Encounter for screening for malignant neoplasm of skin: Secondary | ICD-10-CM | POA: Diagnosis not present

## 2020-09-27 DIAGNOSIS — L570 Actinic keratosis: Secondary | ICD-10-CM

## 2020-09-27 DIAGNOSIS — D3611 Benign neoplasm of peripheral nerves and autonomic nervous system of face, head, and neck: Secondary | ICD-10-CM | POA: Diagnosis not present

## 2020-09-27 NOTE — Patient Instructions (Signed)

## 2020-09-27 NOTE — Progress Notes (Signed)
Follow-Up Visit   Subjective  Jonathan Escobar is a 75 y.o. male who presents for the following: Annual Exam (Total body exam today. Pt has hx of BCC and SCC. He has a spot on the back of his neck that has been there for years but has recently been growing in size.  ). The patient presents for Total-Body Skin Exam (TBSE) for skin cancer screening and mole check.  The following portions of the chart were reviewed this encounter and updated as appropriate:  Tobacco  Allergies  Meds  Problems  Med Hx  Surg Hx  Fam Hx     Review of Systems: No other skin or systemic complaints except as noted in HPI or Assessment and Plan.  Objective  Well appearing patient in no apparent distress; mood and affect are within normal limits.  A full examination was performed including scalp, head, eyes, ears, nose, lips, neck, chest, axillae, abdomen, back, buttocks, bilateral upper extremities, bilateral lower extremities, hands, feet, fingers, toes, fingernails, and toenails. All findings within normal limits unless otherwise noted below.  Objective  Left Forearm: Erythematous keratotic or waxy stuck-on papule or plaque.   Objective  right posterior neck: 1.1cm irritated nevus R/o dysplasia  Objective  face (8): Erythematous thin papules/macules with gritty scale.   Assessment & Plan  Inflamed seborrheic keratosis Left Forearm Prior to procedure, discussed risks of blister formation, small wound, skin dyspigmentation, or rare scar following cryotherapy.   Destruction of lesion - Left Forearm Complexity: simple   Destruction method: cryotherapy   Informed consent: discussed and consent obtained   Timeout:  patient name, date of birth, surgical site, and procedure verified Lesion destroyed using liquid nitrogen: Yes   Region frozen until ice ball extended beyond lesion: Yes   Outcome: patient tolerated procedure well with no complications   Post-procedure details: wound care instructions  given    Neoplasm of uncertain behavior of skin right posterior neck Epidermal / dermal shaving  Lesion diameter (cm):  1.1 Informed consent: discussed and consent obtained   Timeout: patient name, date of birth, surgical site, and procedure verified   Procedure prep:  Patient was prepped and draped in usual sterile fashion Prep type:  Isopropyl alcohol Anesthesia: the lesion was anesthetized in a standard fashion   Anesthetic:  1% lidocaine w/ epinephrine 1-100,000 buffered w/ 8.4% NaHCO3 Instrument used: flexible razor blade   Hemostasis achieved with: pressure, aluminum chloride and electrodesiccation   Outcome: patient tolerated procedure well   Post-procedure details: sterile dressing applied and wound care instructions given   Dressing type: bandage and petrolatum    Specimen 1 - Surgical pathology Differential Diagnosis: R/o dysplasia Check Margins: Yes 1.1cm irritated nevus  AK (actinic keratosis) (8) face Prior to procedure, discussed risks of blister formation, small wound, skin dyspigmentation, or rare scar following cryotherapy.   Destruction of lesion - face Complexity: simple   Destruction method: cryotherapy   Informed consent: discussed and consent obtained   Timeout:  patient name, date of birth, surgical site, and procedure verified Lesion destroyed using liquid nitrogen: Yes   Region frozen until ice ball extended beyond lesion: Yes   Outcome: patient tolerated procedure well with no complications   Post-procedure details: wound care instructions given     Lentigines - Scattered tan macules - Discussed due to sun exposure - Benign, observe - Call for any changes  Seborrheic Keratoses - Stuck-on, waxy, tan-brown papules and plaques  - Discussed benign etiology and prognosis. - Observe -  Call for any changes  Melanocytic Nevi - Tan-brown and/or pink-flesh-colored symmetric macules and papules - Benign appearing on exam today - Observation - Call  clinic for new or changing moles - Recommend daily use of broad spectrum spf 30+ sunscreen to sun-exposed areas.   Hemangiomas - Red papules - Discussed benign nature - Observe - Call for any changes  Xerosis - diffuse xerotic patches - recommend gentle, hydrating skin care - gentle skin care handout given  Actinic Damage - Severe, chronic, secondary to cumulative UV radiation exposure over time - diffuse scaly erythematous macules and papules with underlying dyspigmentation - Discussed Prescription "Field Treatment" for Severe, Chronic Confluent Actinic Changes with Pre-Cancerous Actinic Keratoses Field treatment involves treatment of an entire area of skin that has confluent Actinic Changes (Sun/ Ultraviolet light damage) and PreCancerous Actinic Keratoses by method of PhotoDynamic Therapy (PDT) and/or prescription Topical Chemotherapy agents such as 5-fluorouracil, 5-fluorouracil/calcipotriene, and/or imiquimod.  The purpose is to decrease the number of clinically evident and subclinical PreCancerous lesions to prevent progression to development of skin cancer by chemically destroying early precancer changes that may or may not be visible.  It has been shown to reduce the risk of developing skin cancer in the treated area. As a result of treatment, redness, scaling, crusting, and open sores may occur during treatment course. One or more than one of these methods may be used and may have to be used several times to control, suppress and eliminate the PreCancerous changes. Discussed treatment course, expected reaction, and possible side effects. - Recommend daily broad spectrum sunscreen SPF 30+ to sun-exposed areas, reapply every 2 hours as needed.  - Call for new or changing lesions.  Skin cancer screening performed today.  Return in about 1 month (around 10/28/2020) for 1 month for PDT to face, 4 months f/u with Dr. Nehemiah Massed.   IHarriett Sine, CMA, am acting as scribe for Sarina Ser, MD.  Documentation: I have reviewed the above documentation for accuracy and completeness, and I agree with the above.  Sarina Ser, MD

## 2020-10-01 ENCOUNTER — Telehealth: Payer: Self-pay

## 2020-10-01 NOTE — Telephone Encounter (Signed)
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

## 2020-10-01 NOTE — Telephone Encounter (Signed)
Advised patient of results/hd  

## 2020-10-09 ENCOUNTER — Encounter: Payer: Self-pay | Admitting: Dermatology

## 2020-10-25 ENCOUNTER — Ambulatory Visit: Payer: Medicare HMO

## 2021-01-02 ENCOUNTER — Ambulatory Visit: Payer: Self-pay | Admitting: Dermatology

## 2021-03-13 ENCOUNTER — Other Ambulatory Visit: Payer: Self-pay | Admitting: Cardiovascular Disease

## 2021-03-18 ENCOUNTER — Encounter: Payer: Medicare HMO | Admitting: Internal Medicine

## 2021-03-18 NOTE — Progress Notes (Deleted)
Date:  03/18/2021   Name:  Jonathan Escobar   DOB:  1946/01/12   MRN:  [id number]   Chief Complaint: No chief complaint on file. Jonathan Escobar is a 75 y.o. male who presents today for his Complete Annual Exam. He feels {DESC; WELL/FAIRLY WELL/POORLY:18703}. He reports exercising ***. He reports he is sleeping {DESC; WELL/FAIRLY WELL/POORLY:18703}.   Colonoscopy: 09/2018  Immunization History  Administered Date(s) Administered   Influenza-Unspecified 07/02/2009, 07/26/2009, 06/01/2010, 07/14/2011, 06/01/2013, 06/30/2013, 06/30/2014, 06/23/2015, 06/16/2016, 06/08/2017, 05/02/2018, 06/02/2019, 06/01/2020   PFIZER(Purple Top)SARS-COV-2 Vaccination 08/23/2019, 09/13/2019   Pneumococcal Conjugate-13 11/17/2014   Pneumococcal Polysaccharide-23 12/31/2010, 03/26/2016   Pneumococcal-Unspecified 08/12/2011   Tdap 02/06/2008, 11/28/2009, 12/31/2014    Hypertension This is a chronic problem. The problem is controlled. Past treatments include angiotensin blockers and beta blockers. The current treatment provides significant improvement. Hypertensive end-organ damage includes CAD/MI.  Hyperlipidemia This is a chronic problem. The problem is controlled. Current antihyperlipidemic treatment includes statins.  Gastroesophageal Reflux He complains of heartburn. This is a recurrent problem. The problem occurs rarely. He has tried a PPI for the symptoms.   Lab Results  Component Value Date   CREATININE 1.00 06/18/2020   BUN 12 06/18/2020   NA 136 06/18/2020   K 3.6 06/18/2020   CL 99 06/18/2020   CO2 23 06/18/2020   Lab Results  Component Value Date   CHOL 147 03/12/2020   HDL 50 03/12/2020   LDLCALC 77 03/12/2020   TRIG 99 03/12/2020   CHOLHDL 2.9 03/12/2020   Lab Results  Component Value Date   TSH 1.300 06/18/2020   Lab Results  Component Value Date   HGBA1C 5.8 (H) 03/16/2020   Lab Results  Component Value Date   WBC 6.4 06/18/2020   HGB 14.1 06/18/2020   HCT 42.0  06/18/2020   MCV 94 06/18/2020   PLT 210 06/18/2020   Lab Results  Component Value Date   ALT 26 03/12/2020   AST 23 03/12/2020   ALKPHOS 63 03/12/2020   BILITOT 1.0 03/12/2020     Review of Systems  Gastrointestinal:  Positive for heartburn.   Patient Active Problem List   Diagnosis Date Noted   Benign essential microscopic hematuria 03/12/2020   Pain in left knee 08/23/2019   Atherosclerosis of aorta ([place]) 03/07/2019   GERD (gastroesophageal reflux disease) 01/19/2018   Personal history of other malignant neoplasm of skin 10/08/2015   Essential hypertension 07/19/2014   Fatigue 01/05/2014   Heart palpitations 09/10/2013   Obesity (BMI 30-39.9) 08/09/2013   CAD (coronary artery disease), autologous vein bypass graft --> full metal jacket PCI of SVG-RCA; PCI of SVG-D1 anastomosis 07/02/2013   Hyperlipidemia LDL goal <70    S/P CABG x 4, 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 04/29/2012   ST elevation myocardial infarction (STEMI) of inferior wall, initial episode of care - s/p PTCA of 95% ISR to 70% and 100% distal stent occlusion to ~40% (8/25) 04/25/2012   H/O: GI bleed - 2007 on Plavix 04/25/2012    Allergies  Allergen Reactions   Norvasc [Amlodipine Besylate]     Dizzy   Ace Inhibitors Other (See Comments)    Cough     Past Surgical History:  Procedure Laterality Date   CARDIAC CATHETERIZATION  04/28/12   RCA: 95% ISR followed by 100% stenosis post-stent with significant RPL lesions; LAD: Tandem 80 and 70% lesions involving D1 (1, 1, 1) --> initial angioplasty to RCA aspiration thrombectomy --> urgent CABG;   CORONARY ARTERY  BYPASS GRAFT  04/28/2012   Procedure: CORONARY ARTERY BYPASS GRAFTING (CABG);  Surgeon: Gaye Pollack, MD;  Location: [place];  Service: Open Heart Surgery;  Laterality: N/A;   HERNIA REPAIR Left    INGUINAL HERNIA REPAIR Right 08/06/2016   Procedure: HERNIA REPAIR INGUINAL ADULT;  Surgeon: Clayburn Pert, MD;  Location: [facility];  Service: General;  Laterality: Right;   LEFT AND RIGHT HEART CATHETERIZATION WITH CORONARY ANGIOGRAM N/A 04/25/2012   Procedure: LEFT AND RIGHT HEART CATHETERIZATION WITH CORONARY ANGIOGRAM;  Surgeon: Leonie Man, MD;  Location: [facility] CATH LAB;  Service: Cardiovascular;  Laterality: N/A;   LEFT HEART CATHETERIZATION WITH CORONARY/GRAFT ANGIOGRAM  07/29/2013   Procedure: LEFT HEART CATHETERIZATION WITH Beatrix Fetters;  Surgeon: Blane Ohara, MD;  Location: [facility] CATH LAB;  Service: Cardiovascular; ; 95% stenosis with percent mid occlusion of SVG-RPDA; 90% anastomotic SVG-diagonal, moderate 50% disease in the native circumflex with patent OM 1 OM 2. 80-90% proximal LAD with patent LIMA to LAD , EF 50%   MOHS SURGERY     FOREHEAD   NM MYOVIEW LTD  Jan 2015   Moderate Inferior infarct (as expected), but no ischemia & EF ~45-50%   PERCUTANEOUS CORONARY STENT INTERVENTION (PCI-S)  07/29/2013   Procedure: PERCUTANEOUS CORONARY STENT INTERVENTION (PCI-S);  Surgeon: Blane Ohara, MD;  Location: [facility] CATH LAB;  Service: Cardiovascular;; PCI to SVG-RPDA: Promus DES - 3 mm x 38 mm, 3 mm x 38 mm, 3 mm x 20 mm, 2.75 mm x 38 mm -- all postdilated to ~3.3 mm;   PERCUTANEOUS CORONARY STENT INTERVENTION (PCI-S) N/A 08/01/2013   Procedure: PERCUTANEOUS CORONARY STENT INTERVENTION (PCI-S);  Surgeon: Leonie Man, MD;  Location: [facility] CATH LAB;  Service: Cardiovascular;staged PCI to distal SVG-diagonal: Xience Xpedition 2.5 mm x 20 mm (2.66 mm)     TRANSTHORACIC ECHOCARDIOGRAM  12/'13; 07/30/2013   a) Post CABG: EF 50-55%, moderate HK of anteroseptal wall, septal dyskinesis/dyssynergy due to poststernotomy;  grade 1 diastolic dysfunction. Mildly dilated LA, mildly reduced RV function-likely due to septal dyssynergy.; b) 11/'14 Post Inferior STEMI: EF 50-55% mild LVH. Mild hypokinesis of the distal lateral, and basal to mid inferior lateral and inferior walls -- consistent with RCA infarct    Social  History   Tobacco Use   Smoking status: Former    Packs/day: 1.00    Years: 10.00    Pack years: 10.00    Types: Cigarettes    Quit date: 09/01/1976    Years since quitting: 44.5   Smokeless tobacco: Never  Vaping Use   Vaping Use: Never used  Substance Use Topics   Alcohol use: Yes    Alcohol/week: 2.0 standard drinks    Types: 2 Shots of liquor per week    Comment: 1-2 shots of jack daniels a day.   Drug use: No     Medication list has been reviewed and updated.  Current Meds  Medication Sig   sildenafil (VIAGRA) 100 MG tablet Take by mouth.    PHQ 2/9 Scores 03/12/2020 03/07/2019 02/03/2018  PHQ - 2 Score 0 0 0  PHQ- 9 Score 0 0 -    GAD 7 : Generalized Anxiety Score 03/12/2020  Nervous, Anxious, on Edge 0  Control/stop worrying 0  Worry too much - different things 0  Trouble relaxing 0  Restless 0  Easily annoyed or irritable 0  Afraid - awful might happen 0  Total GAD 7 Score 0  Anxiety Difficulty Not difficult at  all    BP Readings from Last 3 Encounters:  07/30/20 (!) 158/90  06/18/20 (!) 150/80  03/12/20 132/84    Physical Exam  Wt Readings from Last 3 Encounters:  07/30/20 225 lb (102.1 kg)  06/18/20 227 lb (103 kg)  03/12/20 225 lb (102.1 kg)    There were no vitals taken for this visit.  Assessment and Plan:

## 2021-05-30 DIAGNOSIS — M3501 Sicca syndrome with keratoconjunctivitis: Secondary | ICD-10-CM | POA: Diagnosis not present

## 2021-06-24 ENCOUNTER — Other Ambulatory Visit: Payer: Self-pay | Admitting: Physician Assistant

## 2021-06-25 ENCOUNTER — Other Ambulatory Visit: Payer: Self-pay | Admitting: Internal Medicine

## 2021-06-25 DIAGNOSIS — R21 Rash and other nonspecific skin eruption: Secondary | ICD-10-CM

## 2021-06-25 NOTE — Telephone Encounter (Signed)
Requested medication (s) are due for refill today:   Yes  Requested medication (s) are on the active medication list:   Yes  Future visit scheduled:   Yes in 1 mo.   Last ordered: 03/14/2020 15 g, 0 refills  Returned because Rx is expired.     Requested Prescriptions  Pending Prescriptions Disp Refills   ketoconazole (NIZORAL) 2 % cream [Pharmacy Med Name: KETOCONAZOLE 2% CREAM] 15 g 0    Sig: APPLY TO AFFECTED AREA EVERY DAY     Over the Counter:  OTC Failed - 06/25/2021  9:43 AM      Failed - Valid encounter within last 12 months    Recent Outpatient Visits           1 year ago Annual physical exam   [facility] Glean Hess, MD   2 years ago Annual physical exam   [facility] Glean Hess, MD   3 years ago Annual physical exam   [facility] Glean Hess, MD       Future Appointments             In 1 month Army Melia, Jesse Sans, MD [facility]

## 2021-06-26 NOTE — Telephone Encounter (Signed)
Okay to fill?  Patient has CPE 08/12/21, but has not been seen since 03/2020.

## 2021-07-01 ENCOUNTER — Ambulatory Visit: Payer: Medicare HMO

## 2021-07-09 DIAGNOSIS — M65312 Trigger thumb, left thumb: Secondary | ICD-10-CM | POA: Diagnosis not present

## 2021-07-23 ENCOUNTER — Encounter: Payer: Self-pay | Admitting: Cardiovascular Disease

## 2021-07-23 ENCOUNTER — Other Ambulatory Visit: Payer: Self-pay

## 2021-07-23 ENCOUNTER — Ambulatory Visit: Payer: Medicare HMO | Admitting: Cardiovascular Disease

## 2021-07-23 VITALS — BP 158/84 | HR 62 | Ht 69.0 in | Wt 225.2 lb

## 2021-07-23 DIAGNOSIS — I10 Essential (primary) hypertension: Secondary | ICD-10-CM | POA: Diagnosis not present

## 2021-07-23 DIAGNOSIS — I25718 Atherosclerosis of autologous vein coronary artery bypass graft(s) with other forms of angina pectoris: Secondary | ICD-10-CM | POA: Diagnosis not present

## 2021-07-23 DIAGNOSIS — M79671 Pain in right foot: Secondary | ICD-10-CM

## 2021-07-23 DIAGNOSIS — E785 Hyperlipidemia, unspecified: Secondary | ICD-10-CM

## 2021-07-23 MED ORDER — METOPROLOL SUCCINATE ER 25 MG PO TB24: 25.0000 mg | ORAL_TABLET | Freq: Every day | ORAL | 3 refills | Status: DC

## 2021-07-23 NOTE — Progress Notes (Signed)
Cardiology Office Note   Date:  07/23/2021   ID:  Raquon, Milledge [birth year], MRN [id number]  PCP:  Glean Hess, MD  Cardiologist:   Kathlyn Sacramento, MD   Chief Complaint  Patient presents with   Other    12 month f/u c/o pulsation/numbness in right foot is "driving him crazy." Reduced his Metoprolol 100 mg 1/2 qd. Meds reviewed verbally with pt.      History of Present Illness: RONDA KAZMI is a 75 y.o. male who presents for a follow up visit regarding coronary artery disease. He is a former CT surgical PA with extensive cardiac history:  Initial cardiac event was in 1999 with bare-metal stent to the proximal RCA -> had brachytherapy in 2000 and 2003 then redo PCI with a Taxus DES in 2005 for ISR. Inferior STEMI 04/25/2012 - 95% ISR followed by 5% stenosis post stent with lesions in the PL and PDA. Also noted 70% mid LAD; after aspiration thrombectomy, balloon angioplasty performed and he was sent for urgent CABG  (LIMA-LAD, SVG-PDA and PLA, SVG-Diag)  Inferior STEMI in November 2014 with occluded SVG-PDA --> extensive PCI (full metal jacket) of SVG followed by staged PCI of anastomotic SVG-diagonal lesion. No cardiac events since then.   He is known to have short runs of SVT and PACs controlled with metoprolol.  Carvedilol was tried for elevated blood pressure but he did not tolerate the medication was switched back to metoprolol. His blood pressure has been elevated and most recently prescribed a small dose hydrochlorothiazide at the [place] but has not started the medication yet. He has been doing well overall with no chest pain or shortness of breath.  He reports unusual sensation and discomfort in the right foot that started recently.  No calf claudication.  He takes his medications regularly including dual antiplatelet therapy.   Past Medical History:  Diagnosis Date   CAD (coronary artery disease), autologous vein bypass graft Nov 2014   Inf STEMI - PCI to  SVG-RPDA:    CAD in native artery - prior Inferior MI - RCA PCI; 04/2012 - Inferior STEMI - RCA occluded (unable to dilate lesion adequately) + LAD & D1 disease --> referred for CABG 04/07/2012   CAD S/P percutaneous coronary angioplasty 1999   BMS to prox RCA 1999; Brachytherapy ~2003, followed by DES  PCI (Taxus) for ISR.   Coronary stent restenosis due to progression of disease 2000; 2005   (While Still In [place]) Status post Brachytherapy to RCA ISR - 2000; Redo PCI with DES 2005   Diverticulitis    Essential hypertension    CONTROLLED ON MEDS   GERD (gastroesophageal reflux disease)    History of GI bleed     while on Plavix   History of viral meningitis    following GI bleed    History of: ST elevation myocardial infarction (STEMI) of inferior wall, subsequent episode of care April 25, 2012;   RCA: 95% ISR followed by 100% stenosis post-stent with significant RPL lesions; LAD: Tandem 80 and 70% lesions involving D1 (1, 1, 1) --> initial angioplasty to RCA aspiration thrombectomy --> urgent CABG;   Hyperlipidemia LDL goal <70    Non-recurrent unilateral inguinal hernia without obstruction or gangrene    Obesity (BMI 30.0-34.9)    Right inguinal hernia 07/02/2016   S/P CABG x 4: LIMA-LAD, seq SVG-PD-PLA; SVG-diag. 04/28/2012   S/P coronary artery stent placement Nov-Dec 2014   a) Inf STEMIL 11/28/'14 --> extensive (full  metal Jacket) of SVG-RPDA Promus DES - 3 mm x 38 mm, 3 mm x 38 mm, 3 mm x 20 mm, 2.75 mm x 38 mm -- all postdilated to ~3.3 mm;; b) 12/1/'14: staged PCI to distal SVG-diagonal: Xience Xpedition 2.5 mm x 20 mm (2.66 mm)   ST elevation myocardial infarction (STEMI) of inferior wall, subsequent episode of care [facility])  Jul 29, 2013   Diffuse near occlusion of SVG-rPDA, 90% SVG-Diag   STEMI 07/29/13- successful complex full metal jacket stenting of the SVG-PDA/PLA -07/29/2013, staged SVG-Dx PCI 12/01 07/29/2013   Supraventricular tachycardia ([place])     Past Surgical History:   Procedure Laterality Date   CARDIAC CATHETERIZATION  04/28/12   RCA: 95% ISR followed by 100% stenosis post-stent with significant RPL lesions; LAD: Tandem 80 and 70% lesions involving D1 (1, 1, 1) --> initial angioplasty to RCA aspiration thrombectomy --> urgent CABG;   CORONARY ARTERY BYPASS GRAFT  04/28/2012   Procedure: CORONARY ARTERY BYPASS GRAFTING (CABG);  Surgeon: Gaye Pollack, MD;  Location: [place];  Service: Open Heart Surgery;  Laterality: N/A;   HERNIA REPAIR Left    INGUINAL HERNIA REPAIR Right 08/06/2016   Procedure: HERNIA REPAIR INGUINAL ADULT;  Surgeon: Clayburn Pert, MD;  Location: [place];  Service: General;  Laterality: Right;   LEFT AND RIGHT HEART CATHETERIZATION WITH CORONARY ANGIOGRAM N/A 04/25/2012   Procedure: LEFT AND RIGHT HEART CATHETERIZATION WITH CORONARY ANGIOGRAM;  Surgeon: Leonie Man, MD;  Location: [facility] CATH LAB;  Service: Cardiovascular;  Laterality: N/A;   LEFT HEART CATHETERIZATION WITH CORONARY/GRAFT ANGIOGRAM  07/29/2013   Procedure: LEFT HEART CATHETERIZATION WITH Beatrix Fetters;  Surgeon: Blane Ohara, MD;  Location[facility] C[facility] LAB;  Service: Cardiovascular; ; 95% stenosis with percent mid occlusion of SVG-RPDA; 90% anastomotic SVG-diagonal, moderate 50% disease in the native circumflex with patent OM 1 OM 2. 80-90% proximal LAD with patent LIMA to LAD , EF 50%   MOHS SURGERY     FOREHEAD   NM MYOVIEW LTD  Jan 2015   Moderate Inferior infarct (as expected), but no ischemia & EF ~45-50%   PERCUTANEOUS CORONARY STENT INTERVENTION (PCI-S)  07/29/2013   Procedure: PERCUTANEOUS CORONARY STENT INTERVENTION (PCI-S);  Surgeon: Blane Ohara, MD;  Location: [facility] CATH LAB;  Service: Cardiovascular;; PCI to SVG-RPDA: Promus DES - 3 mm x 38 mm, 3 mm x 38 mm, 3 mm x 20 mm, 2.75 mm x 38 mm -- all postdilated to ~3.3 mm;   PERCUTANEOUS CORONARY STENT INTERVENTION (PCI-S) N/A 08/01/2013   Procedure: PERCUTANEOUS CORONARY STENT INTERVENTION  (PCI-S);  Surgeon: Leonie Man, MD;  Location: [facility] CATH LAB;  Service: Cardiovascular;staged PCI to distal SVG-diagonal: Xience Xpedition 2.5 mm x 20 mm (2.66 mm)     TRANSTHORACIC ECHOCARDIOGRAM  12/'13; 07/30/2013   a) Post CABG: EF 50-55%, moderate HK of anteroseptal wall, septal dyskinesis/dyssynergy due to poststernotomy;  grade 1 diastolic dysfunction. Mildly dilated LA, mildly reduced RV function-likely due to septal dyssynergy.; b) 11/'14 Post Inferior STEMI: EF 50-55% mild LVH. Mild hypokinesis of the distal lateral, and basal to mid inferior lateral and inferior walls -- consistent with RCA infarct     Current Outpatient Medications  Medication Sig Dispense Refill   acetaminophen (TYLENOL) 325 MG tablet Take 2 tablets (650 mg total) by mouth every 4 (four) hours as needed for headache or mild pain.     Ascorbic Acid (VITAMIN C) 1000 MG tablet Take 1,000 mg by mouth daily.  aspirin 81 MG tablet Take 81 mg by mouth daily.     atorvastatin (LIPITOR) 10 MG tablet TAKE 1 TABLET BY MOUTH EVERY DAY 90 tablet 0   cetirizine (ZYRTEC) 10 MG tablet Take 10 mg by mouth daily as needed for allergies. PM     clopidogrel (PLAVIX) 75 MG tablet TAKE 1 TABLET (75 MG TOTAL) BY MOUTH DAILY WITH BREAKFAST. 90 tablet 0   ezetimibe (ZETIA) 10 MG tablet Take 1 tablet (10 mg total) by mouth daily. 90 tablet 3   ketoconazole (NIZORAL) 2 % cream APPLY TO AFFECTED AREA EVERY DAY 15 g 0   metoprolol succinate (TOPROL-XL) 50 MG 24 hr tablet Take 1 tablet (50 mg total) by mouth daily. Take with or immediately following a meal. (Patient taking differently: Take 25 mg by mouth daily. Take with or immediately following a meal.) 90 tablet 3   NITROSTAT 0.4 MG SL tablet PLACE 1 TABLET UNDER THE TONGUE EVERY 5 MINUTES X 3 DOSES AS NEEDED FOR CHEST PAIN. 25 tablet 3   Omega-3 Fatty Acids (FISH OIL PO) Take 2 capsules by mouth daily.     pantoprazole (PROTONIX) 40 MG tablet Take 1 tablet (40 mg total) by mouth daily at  6 (six) AM. 90 tablet 3   sildenafil (VIAGRA) 100 MG tablet Take by mouth.     traZODone (DESYREL) 50 MG tablet Take 50 mg by mouth at bedtime.     valsartan (DIOVAN) 320 MG tablet TAKE 1 TABLET BY MOUTH EVERY DAY 90 tablet 1   No current facility-administered medications for this visit.    Allergies:   Norvasc [amlodipine besylate] and Ace inhibitors    Social History:  The patient  reports that he quit smoking about 44 years ago. His smoking use included cigarettes. He has a 10.00 pack-year smoking history. He has never used smokeless tobacco. He reports current alcohol use of about 2.0 standard drinks per week. He reports that he does not use drugs.   Family History:  The patient's family history includes Alzheimer's disease in his mother; Coronary artery disease in his father; Heart failure in his father; Valvular heart disease in his sister.    ROS:  Please see the history of present illness.   Otherwise, review of systems are positive for none.   All other systems are reviewed and negative.    PHYSICAL EXAM: VS:  BP (!) 158/84 (BP Location: Left Arm, Patient Position: Sitting, Cuff Size: Normal)   Pulse 62   Ht 5\' 9"  (1.753 m)   Wt 225 lb 4 oz (102.2 kg)   SpO2 99%   BMI 33.26 kg/m  , BMI Body mass index is 33.26 kg/m. GEN: Well nourished, well developed, in no acute distress  HEENT: normal  Neck: no JVD,  or masses. Faint bilateral carotid bruits Cardiac: RRR; no murmurs, rubs, or gallops,no edema  Respiratory:  clear to auscultation bilaterally, normal work of breathing GI: soft, nontender, nondistended, + BS MS: no deformity or atrophy  Skin: warm and dry, no rash Neuro:  Strength and sensation are intact Psych: euthymic mood, full affect Vascular: Dorsalis pedis and posterior tibial is palpable bilaterally.  EKG:  EKG is ordered today. The ekg ordered today demonstrates sinus rhythm with PACs and left axis deviation  Recent Labs: No results found for requested  labs within last 8760 hours.    Lipid Panel    Component Value Date/Time   CHOL 147 03/12/2020 0920   CHOL 136 03/07/2019 0852  CHOL 153 09/07/2012 1201   TRIG 99 03/12/2020 0920   TRIG 185 09/07/2012 1201   HDL 50 03/12/2020 0920   HDL 45 03/07/2019 0852   HDL 31 (L) 09/07/2012 1201   CHOLHDL 2.9 03/12/2020 0920   VLDL 20 03/12/2020 0920   VLDL 37 09/07/2012 1201   LDLCALC 77 03/12/2020 0920   LDLCALC 68 03/07/2019 0852   LDLCALC 85 09/07/2012 1201      Wt Readings from Last 3 Encounters:  07/23/21 225 lb 4 oz (102.2 kg)  07/30/20 225 lb (102.1 kg)  06/18/20 227 lb (103 kg)      No flowsheet data found.    ASSESSMENT AND PLAN:  1.  Palpitations: These were thought to be due to short runs of SVT as well as PACs.  Symptoms are well controlled with metoprolol.  He did not tolerate carvedilol very well.  He did cut down the dose of Toprol back to 25 mg daily given bradycardia.  2. Coronary artery disease involving bypass graft without angina: Continue indefinite dual antiplatelet therapy.  He has no anginal symptoms at the present time.  3. Hyperlipidemia: He is tolerating small dose of atorvastatin and Zetia.  Most recent lipid profile showed an LDL of 77.  4. Essential hypertension: Blood pressure is mildly elevated.  I agree with the addition of small dose hydrochlorothiazide.  5.  Right foot discomfort of unclear etiology.  The description is suggestive of vascular etiology but his distal pulses are palpable.  He does not have diabetes to cause peripheral neuropathy.  Small vessel disease cannot be excluded.  I requested lower extremity arterial Doppler.   Disposition:   FU with me in 12 months  Signed,  Kathlyn Sacramento, MD  07/23/2021 4:40 PM    [facility]

## 2021-07-23 NOTE — Patient Instructions (Signed)
Medication Instructions:  Your physician recommends that you continue on your current medications as directed. Please refer to the Current Medication list given to you today.  A refill for Metoprolol Succinate 25 mg daily  has been sent to your pharmacy.  *If you need a refill on your cardiac medications before your next appointment, please call your pharmacy*   Lab Work: None ordered If you have labs (blood work) drawn today and your tests are completely normal, you will receive your results only by: [place] (if you have MyChart) OR A paper copy in the mail If you have any lab test that is abnormal or we need to change your treatment, we will call you to review the results.   Testing/Procedures: Your physician has requested that you have a lower extremity arterial segmental doppler    information is provided on this After Visit Summary.  MyChart is used to connect with patients for Virtual Visits (Telemedicine).  Patients are able to view lab/test results, encounter notes, upcoming appointments, etc.  Non-urgent messages can be sent to your provider as well.   To learn more about what you can do with MyChart, go to [email or web address].    Your next appointment:   Your physician wants you to follow-up in: 1 year You will receive a reminder letter in the mail two months in advance. If you don't receive a letter, please call our office to schedule the follow-up appointment.   The format for your next appointment:   In Person  Provider:   You may see Kathlyn Sacramento, MD or one of the following Advanced Practice Providers on your designated Care Team:   Murray Hodgkins, NP Christell Faith, PA-C Cadence Kathlen Mody, [place]  Other Instructions N/A

## 2021-07-25 ENCOUNTER — Other Ambulatory Visit: Payer: Self-pay | Admitting: Physician Assistant

## 2021-08-12 ENCOUNTER — Other Ambulatory Visit: Payer: Self-pay

## 2021-08-12 ENCOUNTER — Ambulatory Visit (INDEPENDENT_AMBULATORY_CARE_PROVIDER_SITE_OTHER): Payer: Medicare HMO | Admitting: Internal Medicine

## 2021-08-12 ENCOUNTER — Encounter: Payer: Self-pay | Admitting: Internal Medicine

## 2021-08-12 ENCOUNTER — Telehealth: Payer: Self-pay | Admitting: *Deleted

## 2021-08-12 VITALS — BP 138/70 | HR 75 | Temp 97.4°F | Ht 69.0 in | Wt 227.4 lb

## 2021-08-12 DIAGNOSIS — Z125 Encounter for screening for malignant neoplasm of prostate: Secondary | ICD-10-CM

## 2021-08-12 DIAGNOSIS — R7309 Other abnormal glucose: Secondary | ICD-10-CM | POA: Diagnosis not present

## 2021-08-12 DIAGNOSIS — E669 Obesity, unspecified: Secondary | ICD-10-CM

## 2021-08-12 DIAGNOSIS — I10 Essential (primary) hypertension: Secondary | ICD-10-CM | POA: Diagnosis not present

## 2021-08-12 DIAGNOSIS — I77810 Thoracic aortic ectasia: Secondary | ICD-10-CM | POA: Diagnosis not present

## 2021-08-12 DIAGNOSIS — K219 Gastro-esophageal reflux disease without esophagitis: Secondary | ICD-10-CM | POA: Diagnosis not present

## 2021-08-12 DIAGNOSIS — D6869 Other thrombophilia: Secondary | ICD-10-CM | POA: Diagnosis not present

## 2021-08-12 DIAGNOSIS — I2510 Atherosclerotic heart disease of native coronary artery without angina pectoris: Secondary | ICD-10-CM

## 2021-08-12 DIAGNOSIS — H9313 Tinnitus, bilateral: Secondary | ICD-10-CM | POA: Diagnosis not present

## 2021-08-12 DIAGNOSIS — H9319 Tinnitus, unspecified ear: Secondary | ICD-10-CM | POA: Insufficient documentation

## 2021-08-12 DIAGNOSIS — Z0000 Encounter for general adult medical examination without abnormal findings: Secondary | ICD-10-CM

## 2021-08-12 DIAGNOSIS — E785 Hyperlipidemia, unspecified: Secondary | ICD-10-CM | POA: Diagnosis not present

## 2021-08-12 DIAGNOSIS — M65312 Trigger thumb, left thumb: Secondary | ICD-10-CM | POA: Insufficient documentation

## 2021-08-12 LAB — POCT URINALYSIS DIPSTICK
Bilirubin, UA: NEGATIVE
Blood, UA: NEGATIVE
Glucose, UA: NEGATIVE
Ketones, UA: NEGATIVE
Leukocytes, UA: NEGATIVE
Nitrite, UA: NEGATIVE
Protein, UA: NEGATIVE
Spec Grav, UA: 1.01 (ref 1.010–1.025)
Urobilinogen, UA: 0.2 E.U./dL
pH, UA: 6.5 (ref 5.0–8.0)

## 2021-08-12 NOTE — Telephone Encounter (Signed)
-----   Message from Rise Mu, PA-C sent at 08/12/2021 12:46 PM EST ----- Please schedule limited 2D echo to evaluate aortic root dilatation.

## 2021-08-12 NOTE — Telephone Encounter (Signed)
Thurmond Butts would like limited echocardiogram for this patient. Order placed and will have scheduling reach out.

## 2021-08-12 NOTE — Progress Notes (Signed)
Date:  08/12/2021   Name:  Jonathan Escobar   DOB:  11/25/1945   MRN:  [id number]   Chief Complaint: Annual Exam Jonathan Escobar is a 75 y.o. male who presents today for his Complete Annual Exam. He feels well. He reports not exercising . He reports he is sleeping well.   Colonoscopy: 09/2018  Immunization History  Administered Date(s) Administered   Influenza-Unspecified 07/02/2009, 07/26/2009, 06/01/2010, 07/14/2011, 06/01/2013, 06/30/2013, 06/30/2014, 06/23/2015, 06/16/2016, 06/08/2017, 05/02/2018, 06/02/2019, 06/01/2020, 06/01/2021   PFIZER(Purple Top)SARS-COV-2 Vaccination 08/23/2019, 09/13/2019   Pneumococcal Conjugate-13 11/17/2014   Pneumococcal Polysaccharide-23 12/31/2010, 03/26/2016   Pneumococcal-Unspecified 08/12/2011   Tdap 02/06/2008, 11/28/2009, 12/31/2014    Lab Results  Component Value Date   PSA1 2.4 03/07/2019    Hypertension This is a chronic problem. The problem is controlled. Pertinent negatives include no chest pain, headaches, palpitations or shortness of breath. Past treatments include diuretics, beta blockers and angiotensin blockers. There are no compliance problems.  Hypertensive end-organ damage includes CAD/MI.  Hyperlipidemia This is a chronic problem. The problem is controlled. Pertinent negatives include no chest pain, myalgias or shortness of breath. Current antihyperlipidemic treatment includes statins and ezetimibe. The current treatment provides significant improvement of lipids.  Gastroesophageal Reflux He complains of heartburn. He reports no abdominal pain, no chest pain, no choking or no wheezing. This is a recurrent problem. The problem occurs occasionally. Pertinent negatives include no fatigue. He has tried a PPI for the symptoms. The treatment provided significant relief.   Lab Results  Component Value Date   NA 136 06/18/2020   K 3.6 06/18/2020   CO2 23 06/18/2020   GLUCOSE 133 (H) 06/18/2020   BUN 12 06/18/2020   CREATININE  1.00 06/18/2020   CALCIUM 9.4 06/18/2020   GFRNONAA 74 06/18/2020   Lab Results  Component Value Date   CHOL 147 03/12/2020   HDL 50 03/12/2020   LDLCALC 77 03/12/2020   TRIG 99 03/12/2020   CHOLHDL 2.9 03/12/2020   Lab Results  Component Value Date   TSH 1.300 06/18/2020   Lab Results  Component Value Date   HGBA1C 5.8 (H) 03/16/2020   Lab Results  Component Value Date   WBC 6.4 06/18/2020   HGB 14.1 06/18/2020   HCT 42.0 06/18/2020   MCV 94 06/18/2020   PLT 210 06/18/2020   Lab Results  Component Value Date   ALT 26 03/12/2020   AST 23 03/12/2020   ALKPHOS 63 03/12/2020   BILITOT 1.0 03/12/2020   No results found for: 25OHVITD2, 25OHVITD3, VD25OH   Review of Systems  Constitutional:  Negative for appetite change, chills, diaphoresis, fatigue and unexpected weight change.  HENT:  Positive for hearing loss and tinnitus. Negative for trouble swallowing and voice change.   Eyes:  Negative for visual disturbance.  Respiratory:  Negative for choking, shortness of breath and wheezing.   Cardiovascular:  Negative for chest pain, palpitations and leg swelling.  Gastrointestinal:  Positive for heartburn. Negative for abdominal pain, blood in stool, constipation and diarrhea.  Genitourinary:  Negative for difficulty urinating, dysuria, frequency and urgency.  Musculoskeletal:  Negative for arthralgias, back pain and myalgias.  Skin:  Negative for color change and rash.  Neurological:  Negative for dizziness, syncope and headaches.  Hematological:  Negative for adenopathy.  Psychiatric/Behavioral:  Negative for dysphoric mood and sleep disturbance. The patient is not nervous/anxious.    Patient Active Problem List   Diagnosis Date Noted   Tinnitus 08/12/2021   Trigger thumb, left  thumb 08/12/2021   Benign essential microscopic hematuria 03/12/2020   Pain in left knee 08/23/2019   Atherosclerosis of aorta ([place]) 03/07/2019   GERD (gastroesophageal reflux disease)  01/19/2018   Personal history of other malignant neoplasm of skin 10/08/2015   Essential hypertension 07/19/2014   Fatigue 01/05/2014   Heart palpitations 09/10/2013   Obesity (BMI 30-39.9) 08/09/2013   CAD (coronary artery disease), autologous vein bypass graft --> full metal jacket PCI of SVG-RCA; PCI of SVG-D1 anastomosis 07/02/2013   Hyperlipidemia LDL goal <70    S/P CABG x 4, 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 04/29/2012   H/O: GI bleed - 2007 on Plavix 04/25/2012    Allergies  Allergen Reactions   Norvasc [Amlodipine Besylate]     Dizzy   Ace Inhibitors Other (See Comments)    Cough     Past Surgical History:  Procedure Laterality Date   CARDIAC CATHETERIZATION  04/28/12   RCA: 95% ISR followed by 100% stenosis post-stent with significant RPL lesions; LAD: Tandem 80 and 70% lesions involving D1 (1, 1, 1) --> initial angioplasty to RCA aspiration thrombectomy --> urgent CABG;   CORONARY ARTERY BYPASS GRAFT  04/28/2012   Procedure: CORONARY ARTERY BYPASS GRAFTING (CABG);  Surgeon: Gaye Pollack, MD;  Location: [place];  Service: Open Heart Surgery;  Laterality: N/A;   HERNIA REPAIR Left    INGUINAL HERNIA REPAIR Right 08/06/2016   Procedure: HERNIA REPAIR INGUINAL ADULT;  Surgeon: Clayburn Pert, MD;  Location: [place];  Service: General;  Laterality: Right;   LEFT AND RIGHT HEART CATHETERIZATION WITH CORONARY ANGIOGRAM N/A 04/25/2012   Procedure: LEFT AND RIGHT HEART CATHETERIZATION WITH CORONARY ANGIOGRAM;  Surgeon: Leonie Man, MD;  Location: [facility] CATH LAB;  Service: Cardiovascular;  Laterality: N/A;   LEFT HEART CATHETERIZATION WITH CORONARY/GRAFT ANGIOGRAM  07/29/2013   Procedure: LEFT HEART CATHETERIZATION WITH Beatrix Fetters;  Surgeon: Blane Ohara, MD;  Location: [facility] CATH LAB;  Service: Cardiovascular; ; 95% stenosis with percent mid occlusion of SVG-RPDA; 90% anastomotic SVG-diagonal, moderate 50% disease in the native circumflex with  patent OM 1 OM 2. 80-90% proximal LAD with patent LIMA to LAD , EF 50%   MOHS SURGERY     FOREHEAD   NM MYOVIEW LTD  Jan 2015   Moderate Inferior infarct (as expected), but no ischemia & EF ~45-50%   PERCUTANEOUS CORONARY STENT INTERVENTION (PCI-S)  07/29/2013   Procedure: PERCUTANEOUS CORONARY STENT INTERVENTION (PCI-S);  Surgeon: Blane Ohara, MD;  Location: [facility] CATH LAB;  Service: Cardiovascular;; PCI to SVG-RPDA: Promus DES - 3 mm x 38 mm, 3 mm x 38 mm, 3 mm x 20 mm, 2.75 mm x 38 mm -- all postdilated to ~3.3 mm;   PERCUTANEOUS CORONARY STENT INTERVENTION (PCI-S) N/A 08/01/2013   Procedure: PERCUTANEOUS CORONARY STENT INTERVENTION (PCI-S);  Surgeon: Leonie Man, MD;  Location: [facility] CATH LAB;  Service: Cardiovascular;staged PCI to distal SVG-diagonal: Xience Xpedition 2.5 mm x 20 mm (2.66 mm)     TRANSTHORACIC ECHOCARDIOGRAM  12/'13; 07/30/2013   a) Post CABG: EF 50-55%, moderate HK of anteroseptal wall, septal dyskinesis/dyssynergy due to poststernotomy;  grade 1 diastolic dysfunction. Mildly dilated LA, mildly reduced RV function-likely due to septal dyssynergy.; b) 11/'14 Post Inferior STEMI: EF 50-55% mild LVH. Mild hypokinesis of the distal lateral, and basal to mid inferior lateral and inferior walls -- consistent with RCA infarct    Social History   Tobacco Use   Smoking status: Former    Packs/day: 1.00  Years: 10.00    Pack years: 10.00    Types: Cigarettes    Quit date: 09/01/1976    Years since quitting: 44.9   Smokeless tobacco: Never  Vaping Use   Vaping Use: Never used  Substance Use Topics   Alcohol use: Yes    Alcohol/week: 2.0 standard drinks    Types: 2 Shots of liquor per week    Comment: 1-2 shots of jack daniels a day.   Drug use: No     Medication list has been reviewed and updated.  Current Meds  Medication Sig   acetaminophen (TYLENOL) 325 MG tablet Take 2 tablets (650 mg total) by mouth every 4 (four) hours as needed for headache or mild pain.    Ascorbic Acid (VITAMIN C) 1000 MG tablet Take 1,000 mg by mouth daily.   aspirin 81 MG tablet Take 81 mg by mouth daily.   atorvastatin (LIPITOR) 10 MG tablet TAKE 1 TABLET BY MOUTH EVERY DAY   cetirizine (ZYRTEC) 10 MG tablet Take 10 mg by mouth daily as needed for allergies. PM   clopidogrel (PLAVIX) 75 MG tablet TAKE 1 TABLET (75 MG TOTAL) BY MOUTH DAILY WITH BREAKFAST.   ezetimibe (ZETIA) 10 MG tablet Take 1 tablet (10 mg total) by mouth daily.   hydrochlorothiazide (MICROZIDE) 12.5 MG capsule Take 12.5 mg by mouth daily.   ketoconazole (NIZORAL) 2 % cream APPLY TO AFFECTED AREA EVERY DAY   metoprolol succinate (TOPROL-XL) 25 MG 24 hr tablet Take 1 tablet (25 mg total) by mouth daily. Take with or immediately following a meal.   NITROSTAT 0.4 MG SL tablet PLACE 1 TABLET UNDER THE TONGUE EVERY 5 MINUTES X 3 DOSES AS NEEDED FOR CHEST PAIN.   Omega-3 Fatty Acids (FISH OIL PO) Take 2 capsules by mouth daily.   pantoprazole (PROTONIX) 40 MG tablet Take 1 tablet (40 mg total) by mouth daily at 6 (six) AM.   sildenafil (VIAGRA) 100 MG tablet Take by mouth.   traZODone (DESYREL) 50 MG tablet Take 50 mg by mouth at bedtime.   valsartan (DIOVAN) 320 MG tablet TAKE 1 TABLET BY MOUTH EVERY DAY    PHQ 2/9 Scores 08/12/2021 03/12/2020 03/07/2019 02/03/2018  PHQ - 2 Score 0 0 0 0  PHQ- 9 Score 0 0 0 -    GAD 7 : Generalized Anxiety Score 08/12/2021 03/12/2020  Nervous, Anxious, on Edge 0 0  Control/stop worrying 0 0  Worry too much - different things 0 0  Trouble relaxing 0 0  Restless 0 0  Easily annoyed or irritable 0 0  Afraid - awful might happen 0 0  Total GAD 7 Score 0 0  Anxiety Difficulty Not difficult at all Not difficult at all    BP Readings from Last 3 Encounters:  08/12/21 138/70  07/23/21 (!) 158/84  07/30/20 (!) 158/90    Physical Exam Vitals and nursing note reviewed.  Constitutional:      Appearance: Normal appearance. He is well-developed.  HENT:     Head: Normocephalic.      Right Ear: Tympanic membrane, ear canal and external ear normal.     Left Ear: Tympanic membrane, ear canal and external ear normal.     Nose: Nose normal.  Eyes:     Conjunctiva/sclera: Conjunctivae normal.     Pupils: Pupils are equal, round, and reactive to light.  Neck:     Thyroid: No thyromegaly.     Vascular: No carotid bruit.  Cardiovascular:  Rate and Rhythm: Normal rate and regular rhythm.     Heart sounds: Normal heart sounds.  Pulmonary:     Effort: Pulmonary effort is normal.     Breath sounds: Normal breath sounds. No wheezing.  Chest:  Breasts:    Right: No mass.     Left: No mass.  Abdominal:     General: Bowel sounds are normal.     Palpations: Abdomen is soft.     Tenderness: There is no abdominal tenderness.  Musculoskeletal:        General: Normal range of motion.     Cervical back: Normal range of motion and neck supple.  Lymphadenopathy:     Cervical: No cervical adenopathy.  Skin:    General: Skin is warm and dry.  Neurological:     Mental Status: He is alert and oriented to person, place, and time.     Deep Tendon Reflexes: Reflexes are normal and symmetric.  Psychiatric:        Attention and Perception: Attention normal.        Mood and Affect: Mood normal.        Thought Content: Thought content normal.    Wt Readings from Last 3 Encounters:  08/12/21 227 lb 6.4 oz (103.1 kg)  07/23/21 225 lb 4 oz (102.2 kg)  07/30/20 225 lb (102.1 kg)    BP 138/70   Pulse 75   Temp (!) 97.4 F (36.3 C) (Oral)   Ht 5\' 9"  (1.753 m)   Wt 227 lb 6.4 oz (103.1 kg)   SpO2 99%   BMI 33.58 kg/m   Assessment and Plan: 1. Annual physical exam Exam is normal except for weight. Encourage regular exercise and appropriate dietary changes. Up to date on screenings and immunizations.  2. Prostate cancer screening DRE deferred - PSA  3. Essential hypertension Clinically stable exam with well controlled BP. Tolerating medications without side effects  at this time. Pt to continue current regimen and low sodium diet; benefits of regular exercise as able discussed. - Comprehensive metabolic panel - POCT urinalysis dipstick  4. Gastroesophageal reflux disease without esophagitis Symptoms well controlled on daily PPI No red flag signs such as weight loss, n/v, melena Will continue pantoprazole. - CBC with Differential/Platelet  5. Obesity (BMI 30-39.9) Check A1C; work on diet changes and exercise for weight loss - Hemoglobin A1c  6. Hyperlipidemia LDL goal <70 Tolerating statin medication without side effects at this time - atorvastatin and Zetia LDL is at goal of < 70 on current dose Continue same therapy without change at this time. - Lipid panel  7. Dilated aortic root ([place]) Seen last year on ECHO ordered by Cardiology Will message the PA and ask them to review and order follow up.  8. 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 No bleeding issues noted.  He has hx of microscopic hematuria but UA today was negative.   Partially dictated using Editor, commissioning. Any errors are unintentional.  Halina Maidens, MD [facility]  08/12/2021

## 2021-08-13 LAB — COMPREHENSIVE METABOLIC PANEL
ALT: 26 IU/L (ref 0–44)
AST: 22 IU/L (ref 0–40)
Albumin/Globulin Ratio: 1.5 (ref 1.2–2.2)
Albumin: 4.4 g/dL (ref 3.7–4.7)
Alkaline Phosphatase: 74 IU/L (ref 44–121)
BUN/Creatinine Ratio: 11 (ref 10–24)
BUN: 11 mg/dL (ref 8–27)
Bilirubin Total: 0.5 mg/dL (ref 0.0–1.2)
CO2: 23 mmol/L (ref 20–29)
Calcium: 9.5 mg/dL (ref 8.6–10.2)
Chloride: 91 mmol/L — ABNORMAL LOW (ref 96–106)
Creatinine, Ser: 1.02 mg/dL (ref 0.76–1.27)
Globulin, Total: 3 g/dL (ref 1.5–4.5)
Glucose: 97 mg/dL (ref 70–99)
Potassium: 4.3 mmol/L (ref 3.5–5.2)
Sodium: 128 mmol/L — ABNORMAL LOW (ref 134–144)
Total Protein: 7.4 g/dL (ref 6.0–8.5)
eGFR: 77 mL/min/{1.73_m2} (ref >59)

## 2021-08-13 LAB — CBC WITH DIFFERENTIAL/PLATELET
Basophils Absolute: 0.1 10*3/uL (ref 0.0–0.2)
Basos: 1 %
EOS (ABSOLUTE): 0.4 10*3/uL (ref 0.0–0.4)
Eos: 5 %
Hematocrit: 39.2 % (ref 37.5–51.0)
Hemoglobin: 13.8 g/dL (ref 13.0–17.7)
Immature Grans (Abs): 0.2 10*3/uL — ABNORMAL HIGH (ref 0.0–0.1)
Immature Granulocytes: 2 %
Lymphocytes Absolute: 1.3 10*3/uL (ref 0.7–3.1)
Lymphs: 16 %
MCH: 31.7 pg (ref 26.6–33.0)
MCHC: 35.2 g/dL (ref 31.5–35.7)
MCV: 90 fL (ref 79–97)
Monocytes Absolute: 0.9 10*3/uL (ref 0.1–0.9)
Monocytes: 12 %
Neutrophils Absolute: 5.1 10*3/uL (ref 1.4–7.0)
Neutrophils: 64 %
Platelets: 264 10*3/uL (ref 150–450)
RBC: 4.35 x10E6/uL (ref 4.14–5.80)
RDW: 12 % (ref 11.6–15.4)
WBC: 8 10*3/uL (ref 3.4–10.8)

## 2021-08-13 LAB — PSA: Prostate Specific Ag, Serum: 3.9 ng/mL (ref 0.0–4.0)

## 2021-08-13 LAB — LIPID PANEL
Chol/HDL Ratio: 3 ratio (ref 0.0–5.0)
Cholesterol, Total: 146 mg/dL (ref 100–199)
HDL: 49 mg/dL (ref >39)
LDL Chol Calc (NIH): 76 mg/dL (ref 0–99)
Triglycerides: 114 mg/dL (ref 0–149)
VLDL Cholesterol Cal: 21 mg/dL (ref 5–40)

## 2021-08-13 LAB — HEMOGLOBIN A1C
Est. average glucose Bld gHb Est-mCnc: 123 mg/dL
Hgb A1c MFr Bld: 5.9 % — ABNORMAL HIGH (ref 4.8–5.6)

## 2021-08-14 NOTE — Telephone Encounter (Signed)
Noted. FYI update sent to Dr. Fletcher Anon.

## 2021-08-14 NOTE — Telephone Encounter (Signed)
Scheduled for echo 12-30 .   Jonathan Escobar - Patient cancelled LEA no longer having pain or issues with leg and doesn't feel needed at this time .

## 2021-08-15 NOTE — Telephone Encounter (Signed)
ok 

## 2021-08-30 ENCOUNTER — Ambulatory Visit (INDEPENDENT_AMBULATORY_CARE_PROVIDER_SITE_OTHER): Payer: Medicare HMO

## 2021-08-30 ENCOUNTER — Other Ambulatory Visit: Payer: Self-pay

## 2021-08-30 DIAGNOSIS — I2510 Atherosclerotic heart disease of native coronary artery without angina pectoris: Secondary | ICD-10-CM | POA: Diagnosis not present

## 2021-08-30 DIAGNOSIS — I77810 Thoracic aortic ectasia: Secondary | ICD-10-CM | POA: Diagnosis not present

## 2021-08-30 LAB — ECHOCARDIOGRAM LIMITED
AR max vel: 4.8 cm2
AV Area VTI: 4.51 cm2
AV Area mean vel: 4.23 cm2
AV Mean grad: 2 mmHg
AV Peak grad: 4.5 mmHg
Ao pk vel: 1.06 m/s
Area-P 1/2: 2.17 cm2
Calc EF: 48.5 %
S' Lateral: 3 cm
Single Plane A2C EF: 49.1 %
Single Plane A4C EF: 48.1 %

## 2021-08-31 ENCOUNTER — Other Ambulatory Visit: Payer: Self-pay | Admitting: Cardiovascular Disease

## 2021-09-11 ENCOUNTER — Ambulatory Visit: Payer: Self-pay

## 2021-09-11 ENCOUNTER — Telehealth: Payer: Self-pay

## 2021-09-11 ENCOUNTER — Telehealth (INDEPENDENT_AMBULATORY_CARE_PROVIDER_SITE_OTHER): Payer: Medicare PPO | Admitting: Internal Medicine

## 2021-09-11 ENCOUNTER — Encounter: Payer: Self-pay | Admitting: Internal Medicine

## 2021-09-11 VITALS — Temp 97.0°F | Ht 69.0 in

## 2021-09-11 DIAGNOSIS — U071 COVID-19: Secondary | ICD-10-CM | POA: Diagnosis not present

## 2021-09-11 MED ORDER — MOLNUPIRAVIR EUA 200MG CAPSULE: 4.0000 | ORAL_CAPSULE | Freq: Two times a day (BID) | ORAL | 0 refills | Status: AC

## 2021-09-11 MED ORDER — PROMETHAZINE-DM 6.25-15 MG/5ML PO SYRP
5.0000 mL | ORAL_SOLUTION | Freq: Four times a day (QID) | ORAL | 0 refills | Status: DC | PRN
Start: 2021-09-11 — End: 2022-05-15

## 2021-09-11 NOTE — Telephone Encounter (Signed)

## 2021-09-11 NOTE — Patient Instructions (Signed)
Take Tylenol 650 - 1000 mg every 6-8 hours for fever, body aches and headache. Drink plenty of fluids with electrolytes. Monitor for fever that does not decrease and/or shortness of breath that worsens or is present at rest. Quarantine for 5 days.  

## 2021-09-11 NOTE — Progress Notes (Signed)
Date:  09/11/2021   Name:  Jonathan Escobar   DOB:  Sep 14, 1945   MRN:  [id number]  This encounter was conducted via video encounter due to the need for social distancing in light of the Covid-19 pandemic.  The patient was correctly identified.  I advised that I am conducting the visit from a secure room in my office at [facility] clinic.  The patient is located at home. The limitations of this form of encounter were discussed with the patient and he/she agreed to proceed.  Some vital signs will be absent.  Chief Complaint: Covid Positive (Positive home test 1 day ago)  URI  This is a new problem. Episode onset: 2-3 days. The problem has been unchanged. The maximum temperature recorded prior to his arrival was 100.4 - 100.9 F. The fever has been present for 1 to 2 days. Associated symptoms include congestion, coughing, headaches, a plugged ear sensation, rhinorrhea, sinus pain, sneezing and a sore throat. Pertinent negatives include no chest pain, diarrhea, nausea, vomiting or wheezing. He has tried acetaminophen for the symptoms. The treatment provided mild relief.   Lab Results  Component Value Date   NA 128 (L) 08/12/2021   K 4.3 08/12/2021   CO2 23 08/12/2021   GLUCOSE 97 08/12/2021   BUN 11 08/12/2021   CREATININE 1.02 08/12/2021   CALCIUM 9.5 08/12/2021   EGFR 77 08/12/2021   GFRNONAA 74 06/18/2020   Lab Results  Component Value Date   CHOL 146 08/12/2021   HDL 49 08/12/2021   LDLCALC 76 08/12/2021   TRIG 114 08/12/2021   CHOLHDL 3.0 08/12/2021   Lab Results  Component Value Date   TSH 1.300 06/18/2020   Lab Results  Component Value Date   HGBA1C 5.9 (H) 08/12/2021   Lab Results  Component Value Date   WBC 8.0 08/12/2021   HGB 13.8 08/12/2021   HCT 39.2 08/12/2021   MCV 90 08/12/2021   PLT 264 08/12/2021   Lab Results  Component Value Date   ALT 26 08/12/2021   AST 22 08/12/2021   ALKPHOS 74 08/12/2021   BILITOT 0.5 08/12/2021   No results found for:  25OHVITD2, 25OHVITD3, VD25OH   Review of Systems  Constitutional:  Positive for fatigue and fever. Negative for chills.  HENT:  Positive for congestion, rhinorrhea, sinus pain, sneezing and sore throat.   Respiratory:  Positive for cough. Negative for chest tightness, shortness of breath and wheezing.   Cardiovascular:  Negative for chest pain.  Gastrointestinal:  Negative for diarrhea, nausea and vomiting.  Neurological:  Positive for headaches.   Patient Active Problem List   Diagnosis Date Noted   Tinnitus 08/12/2021   Trigger thumb, left thumb 08/12/2021   Benign essential microscopic hematuria 03/12/2020   Pain in left knee 08/23/2019   Atherosclerosis of aorta ([place]) 03/07/2019   GERD (gastroesophageal reflux disease) 01/19/2018   Personal history of other malignant neoplasm of skin 10/08/2015   Essential hypertension 07/19/2014   Fatigue 01/05/2014   Heart palpitations 09/10/2013   Obesity (BMI 30-39.9) 08/09/2013   CAD (coronary artery disease), autologous vein bypass graft --> full metal jacket PCI of SVG-RCA; PCI of SVG-D1 anastomosis 07/02/2013   Hyperlipidemia LDL goal <70    S/P CABG x 4, 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 04/29/2012   H/O: GI bleed - 2007 on Plavix 04/25/2012    Allergies  Allergen Reactions   Norvasc [Amlodipine Besylate]     Dizzy   Ace Inhibitors Other (See  Comments)    Cough     Past Surgical History:  Procedure Laterality Date   CARDIAC CATHETERIZATION  04/28/12   RCA: 95% ISR followed by 100% stenosis post-stent with significant RPL lesions; LAD: Tandem 80 and 70% lesions involving D1 (1, 1, 1) --> initial angioplasty to RCA aspiration thrombectomy --> urgent CABG;   CORONARY ARTERY BYPASS GRAFT  04/28/2012   Procedure: CORONARY ARTERY BYPASS GRAFTING (CABG);  Surgeon: Gaye Pollack, MD;  Location: [place];  Service: Open Heart Surgery;  Laterality: N/A;   HERNIA REPAIR Left    INGUINAL HERNIA REPAIR Right 08/06/2016   Procedure:  HERNIA REPAIR INGUINAL ADULT;  Surgeon: Clayburn Pert, MD;  Location: [place];  Service: General;  Laterality: Right;   LEFT AND RIGHT HEART CATHETERIZATION WITH CORONARY ANGIOGRAM N/A 04/25/2012   Procedure: LEFT AND RIGHT HEART CATHETERIZATION WITH CORONARY ANGIOGRAM;  Surgeon: Leonie Man, MD;  Location: [facility] CATH LAB;  Service: Cardiovascular;  Laterality: N/A;   LEFT HEART CATHETERIZATION WITH CORONARY/GRAFT ANGIOGRAM  07/29/2013   Procedure: LEFT HEART CATHETERIZATION WITH Beatrix Fetters;  Surgeon: Blane Ohara, MD;  Location: [facility] CATH LAB;  Service: Cardiovascular; ; 95% stenosis with percent mid occlusion of SVG-RPDA; 90% anastomotic SVG-diagonal, moderate 50% disease in the native circumflex with patent OM 1 OM 2. 80-90% proximal LAD with patent LIMA to LAD , EF 50%   MOHS SURGERY     FOREHEAD   NM MYOVIEW LTD  Jan 2015   Moderate Inferior infarct (as expected), but no ischemia & EF ~45-50%   PERCUTANEOUS CORONARY STENT INTERVENTION (PCI-S)  07/29/2013   Procedure: PERCUTANEOUS CORONARY STENT INTERVENTION (PCI-S);  Surgeon: Blane Ohara, MD;  Location: [facility] CATH LAB;  Service: Cardiovascular;; PCI to SVG-RPDA: Promus DES - 3 mm x 38 mm, 3 mm x 38 mm, 3 mm x 20 mm, 2.75 mm x 38 mm -- all postdilated to ~3.3 mm;   PERCUTANEOUS CORONARY STENT INTERVENTION (PCI-S) N/A 08/01/2013   Procedure: PERCUTANEOUS CORONARY STENT INTERVENTION (PCI-S);  Surgeon: Leonie Man, MD;  Location: [facility] CATH LAB;  Service: Cardiovascular;staged PCI to distal SVG-diagonal: Xience Xpedition 2.5 mm x 20 mm (2.66 mm)     TRANSTHORACIC ECHOCARDIOGRAM  12/'13; 07/30/2013   a) Post CABG: EF 50-55%, moderate HK of anteroseptal wall, septal dyskinesis/dyssynergy due to poststernotomy;  grade 1 diastolic dysfunction. Mildly dilated LA, mildly reduced RV function-likely due to septal dyssynergy.; b) 11/'14 Post Inferior STEMI: EF 50-55% mild LVH. Mild hypokinesis of the distal lateral, and basal to  mid inferior lateral and inferior walls -- consistent with RCA infarct    Social History   Tobacco Use   Smoking status: Former    Packs/day: 1.00    Years: 10.00    Pack years: 10.00    Types: Cigarettes    Quit date: 09/01/1976    Years since quitting: 45.0   Smokeless tobacco: Never  Vaping Use   Vaping Use: Never used  Substance Use Topics   Alcohol use: Yes    Alcohol/week: 2.0 standard drinks    Types: 2 Shots of liquor per week    Comment: 1-2 shots of jack daniels a day.   Drug use: No     Medication list has been reviewed and updated.  Current Meds  Medication Sig   acetaminophen (TYLENOL) 325 MG tablet Take 2 tablets (650 mg total) by mouth every 4 (four) hours as needed for headache or mild pain.   Ascorbic Acid (VITAMIN C) 1000  MG tablet Take 1,000 mg by mouth daily.   aspirin 81 MG tablet Take 81 mg by mouth daily.   atorvastatin (LIPITOR) 10 MG tablet TAKE 1 TABLET BY MOUTH EVERY DAY   cetirizine (ZYRTEC) 10 MG tablet Take 10 mg by mouth daily as needed for allergies. PM   clopidogrel (PLAVIX) 75 MG tablet TAKE 1 TABLET (75 MG TOTAL) BY MOUTH DAILY WITH BREAKFAST.   ezetimibe (ZETIA) 10 MG tablet Take 1 tablet (10 mg total) by mouth daily.   hydrochlorothiazide (MICROZIDE) 12.5 MG capsule Take 12.5 mg by mouth daily.   ketoconazole (NIZORAL) 2 % cream APPLY TO AFFECTED AREA EVERY DAY   metoprolol succinate (TOPROL-XL) 25 MG 24 hr tablet Take 1 tablet (25 mg total) by mouth daily. Take with or immediately following a meal.   molnupiravir EUA (LAGEVRIO) 200 mg CAPS capsule Take 4 capsules (800 mg total) by mouth 2 (two) times daily for 5 days.   NITROSTAT 0.4 MG SL tablet PLACE 1 TABLET UNDER THE TONGUE EVERY 5 MINUTES X 3 DOSES AS NEEDED FOR CHEST PAIN.   Omega-3 Fatty Acids (FISH OIL PO) Take 2 capsules by mouth daily.   pantoprazole (PROTONIX) 40 MG tablet Take 1 tablet (40 mg total) by mouth daily at 6 (six) AM.   promethazine-dextromethorphan  (PROMETHAZINE-DM) 6.25-15 MG/5ML syrup Take 5 mLs by mouth 4 (four) times daily as needed for cough.   sildenafil (VIAGRA) 100 MG tablet Take by mouth.   traZODone (DESYREL) 50 MG tablet Take 50 mg by mouth at bedtime.   valsartan (DIOVAN) 320 MG tablet TAKE 1 TABLET BY MOUTH EVERY DAY    PHQ 2/9 Scores 08/12/2021 03/12/2020 03/07/2019 02/03/2018  PHQ - 2 Score 0 0 0 0  PHQ- 9 Score 0 0 0 -    GAD 7 : Generalized Anxiety Score 08/12/2021 03/12/2020  Nervous, Anxious, on Edge 0 0  Control/stop worrying 0 0  Worry too much - different things 0 0  Trouble relaxing 0 0  Restless 0 0  Easily annoyed or irritable 0 0  Afraid - awful might happen 0 0  Total GAD 7 Score 0 0  Anxiety Difficulty Not difficult at all Not difficult at all    BP Readings from Last 3 Encounters:  08/12/21 138/70  07/23/21 (!) 158/84  07/30/20 (!) 158/90    Physical Exam Constitutional:      Appearance: He is ill-appearing.  Pulmonary:     Effort: Pulmonary effort is normal.     Breath sounds: No wheezing.  Neurological:     General: No focal deficit present.     Mental Status: He is alert and oriented to person, place, and time.  Psychiatric:        Mood and Affect: Mood normal.        Behavior: Behavior normal.    Wt Readings from Last 3 Encounters:  08/12/21 227 lb 6.4 oz (103.1 kg)  07/23/21 225 lb 4 oz (102.2 kg)  07/30/20 225 lb (102.1 kg)    Temp (!) 97 F (36.1 C) (Temporal)    Ht _0  (1.753 m)    BMI 33.58 kg/m   Assessment and Plan: 1. COVID-19 virus infection Take Tylenol 650 - 1000 mg every 6-8 hours for fever, body aches and headache. Drink plenty of fluids with electrolytes. Monitor for fever that does not decrease and/or shortness of breath that worsens or is present at rest. Quarantine for 5 days - molnupiravir EUA (LAGEVRIO) 200 mg CAPS capsule;  Take 4 capsules (800 mg total) by mouth 2 (two) times daily for 5 days.  Dispense: 40 capsule; Refill: 0 -  promethazine-dextromethorphan (PROMETHAZINE-DM) 6.25-15 MG/5ML syrup; Take 5 mLs by mouth 4 (four) times daily as needed for cough.  Dispense: 180 mL; Refill: 0  I spent 8 minutes on this encounter, 100% of the time via video. Partially dictated using Editor, commissioning. Any errors are unintentional.  Halina Maidens, MD [facility]  09/11/2021

## 2021-09-11 NOTE — Telephone Encounter (Signed)
Pt' wife stated pt tested positive for Covid yesterday 09/10/2021. Pt requesting medication as he is not feeling well. Stated has had an on off fever yesterday (102.00) today (101.00) sore throat.   No SOB.   Seeking clinical advice.    Chief Complaint: Positive home COVID test yesterday. Symptoms: Sore throat, cough, fatigue, fever 101. Frequency: Started Monday. Pertinent Negatives: Patient denies  Disposition: [] ED /[] Urgent Care (no appt availability in office) / [x] Appointment(In office/virtual)/ []  [facility]/ [] Home Care/ [] Refused Recommended Disposition /[] [facility]/ []  Follow-up with PCP Additional Notes: Pt. Requesting anti-viral medication.   Reason for Disposition  MILD difficulty breathing (e.g., minimal/no SOB at rest, SOB with walking, pulse <100)  Answer Assessment - Initial Assessment Questions 1. COVID-19 DIAGNOSIS: "Who made your COVID-19 diagnosis?" "Was it confirmed by a positive lab test or self-test?" If not diagnosed by a doctor (or NP/PA), ask "Are there lots of cases (community spread) where you live?" Note: See public health department website, if unsure.     Home test yesterday 2. COVID-19 EXPOSURE: "Was there any known exposure to COVID before the symptoms began?" CDC Definition of close contact: within 6 feet (2 meters) for a total of 15 minutes or more over a 24-hour period.      No 3. ONSET: "When did the COVID-19 symptoms start?"      2 days ago 4. WORST SYMPTOM: "What is your worst symptom?" (e.g., cough, fever, shortness of breath, muscle aches)     Sore throat 5. COUGH: "Do you have a cough?" If Yes, ask: "How bad is the cough?"       Yes 6. FEVER: "Do you have a fever?" If Yes, ask: "What is your temperature, how was it measured, and when did it start?"     101 7. RESPIRATORY STATUS: "Describe your breathing?" (e.g., shortness of breath, wheezing, unable to speak)      Feels tight 8. BETTER-SAME-WORSE: "Are you getting  better, staying the same or getting worse compared to yesterday?"  If getting worse, ask, "In what way?"     Worse  9. HIGH RISK DISEASE: "Do you have any chronic medical problems?" (e.g., asthma, heart or lung disease, weak immune system, obesity, etc.)     CAD 10. VACCINE: "Have you had the COVID-19 vaccine?" If Yes, ask: "Which one, how many shots, when did you get it?"       N/a 11. BOOSTER: "Have you received your COVID-19 booster?" If Yes, ask: "Which one and when did you get it?"       N/a 12. PREGNANCY: "Is there any chance you are pregnant?" "When was your last menstrual period?"       N/a 13. OTHER SYMPTOMS: "Do you have any other symptoms?"  (e.g., chills, fatigue, headache, loss of smell or taste, muscle pain, sore throat)       Sore throat, fatigue 14. O2 SATURATION MONITOR:  "Do you use an oxygen saturation monitor (pulse oximeter) at home?" If Yes, ask "What is your reading (oxygen level) today?" "What is your usual oxygen saturation reading?" (e.g., 95%)       94-95%  Protocols used: Coronavirus (COVID-19) Diagnosed or Suspected-A-AH

## 2021-09-19 ENCOUNTER — Other Ambulatory Visit: Payer: Self-pay | Admitting: Physician Assistant

## 2021-09-20 ENCOUNTER — Ambulatory Visit: Payer: Medicare HMO | Admitting: Primary Care

## 2021-11-13 DIAGNOSIS — M3501 Sicca syndrome with keratoconjunctivitis: Secondary | ICD-10-CM | POA: Diagnosis not present

## 2021-11-13 DIAGNOSIS — Z0100 Encounter for examination of eyes and vision without abnormal findings: Secondary | ICD-10-CM | POA: Diagnosis not present

## 2021-11-13 IMAGING — CT CT ABD-PELV W/ CM
2 of 5 series · 16 of 46 positions shown, 18 images · IV contrast (omnipaque)
Comparison: CT of the abdomen pelvis dated 01/20/2018.

CLINICAL DATA: 71-year-old male with left lower quadrant abdominal
pain and nausea.

EXAM:
CT ABDOMEN AND PELVIS WITH CONTRAST
TECHNIQUE: Multidetector CT imaging of the abdomen and pelvis was performed
using the standard protocol following bolus administration of
intravenous contrast.
CONTRAST:  100mL OMNIPAQUE IOHEXOL 300 MG/ML  SOLN

[Series 2: abd pelvis 5.00 · axial · 0.89mm/px · z∈[-1501,-1051]mm · 13 of 102 slices shown, 15 images]
[im 6/102  soft-tissue]
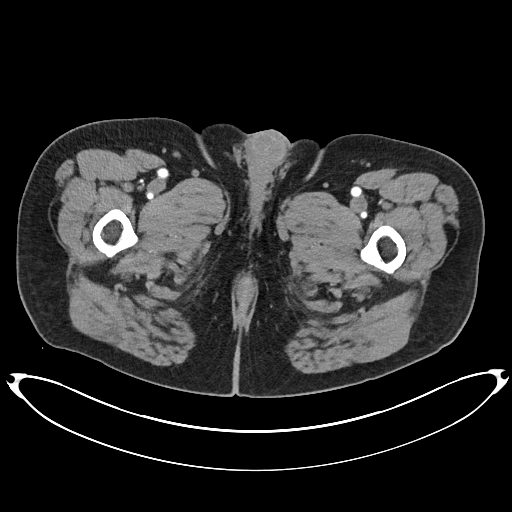
[im 6/102  bone]
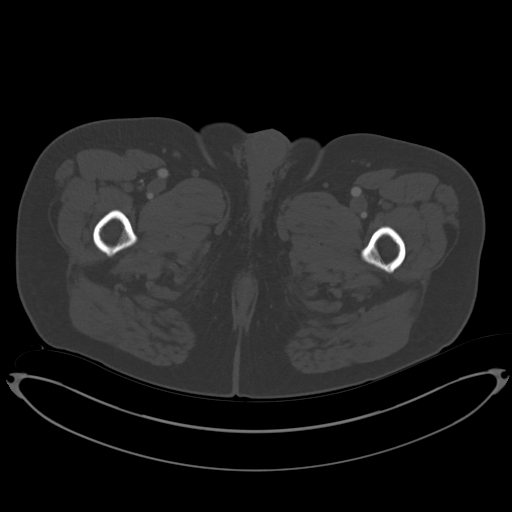
[im 12/102  soft-tissue]
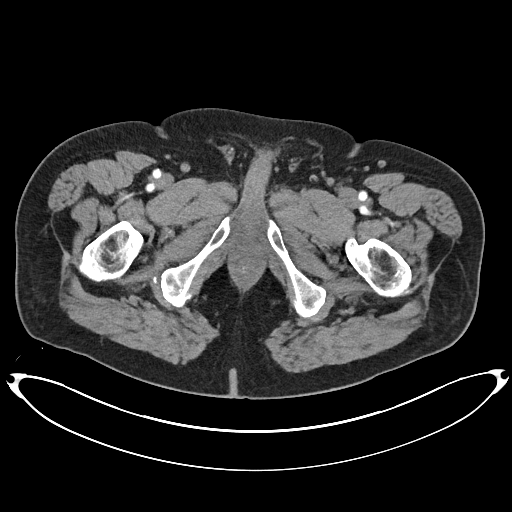
[im 24/102  soft-tissue]
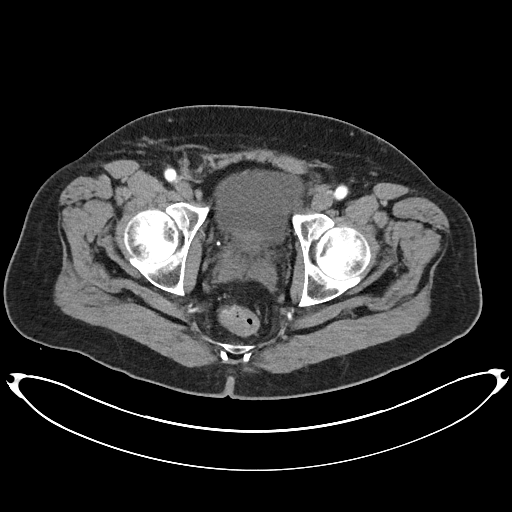
[im 30/102  soft-tissue]
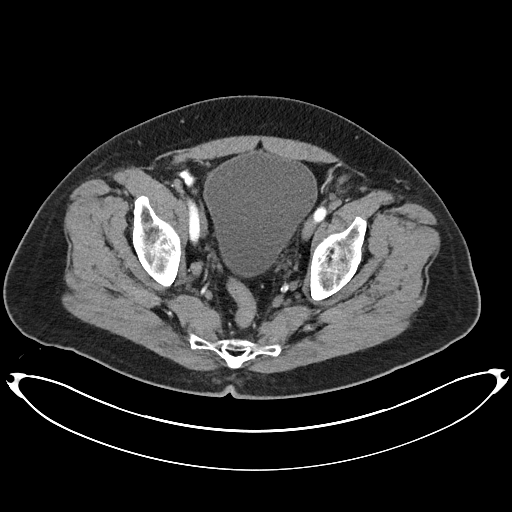
[im 36/102  soft-tissue]
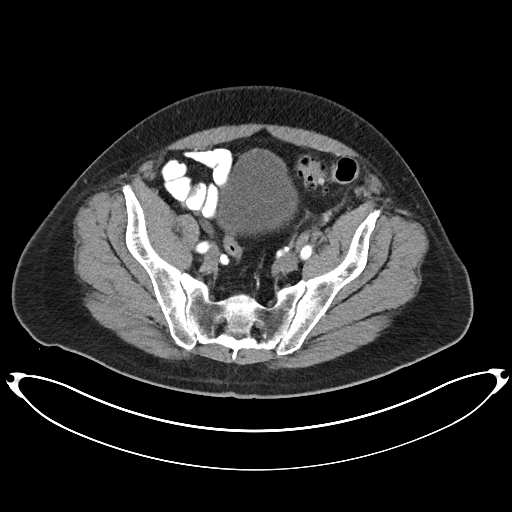
[im 42/102  soft-tissue]
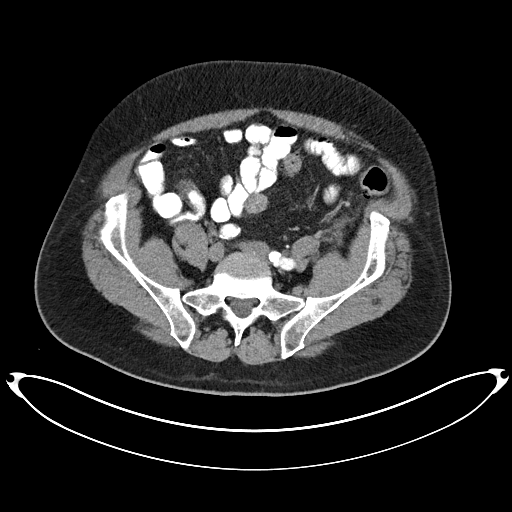
[im 54/102  soft-tissue]
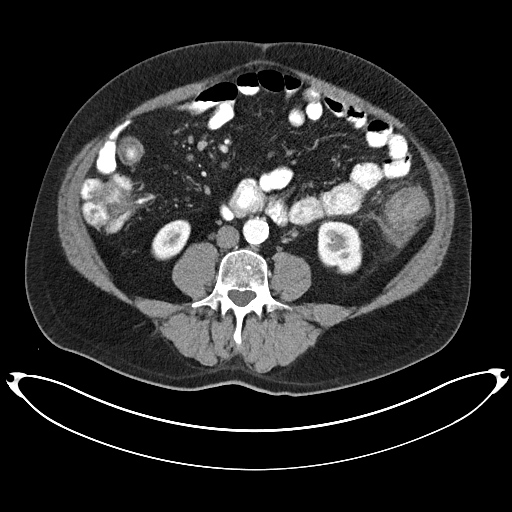
[im 60/102  soft-tissue]
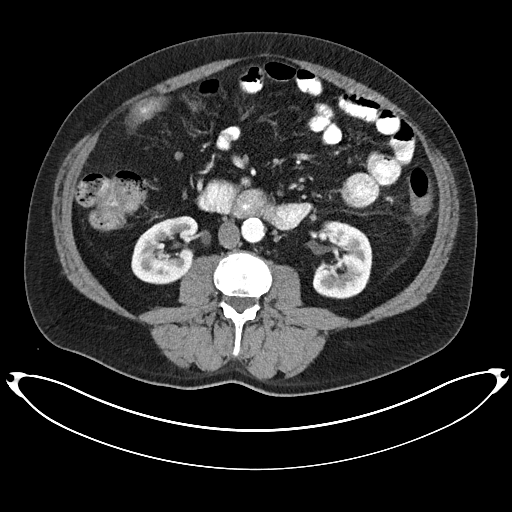
[im 66/102  soft-tissue]
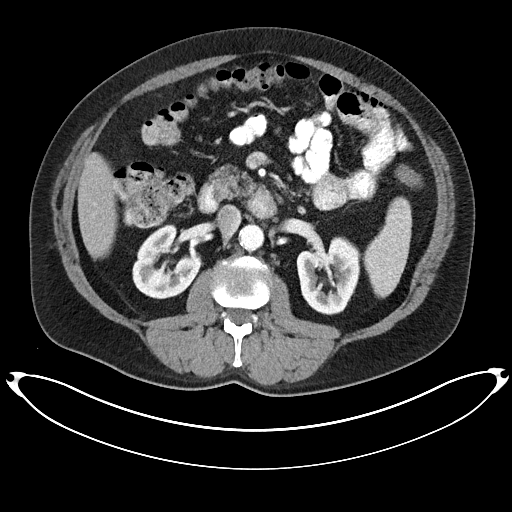
[im 66/102  bone]
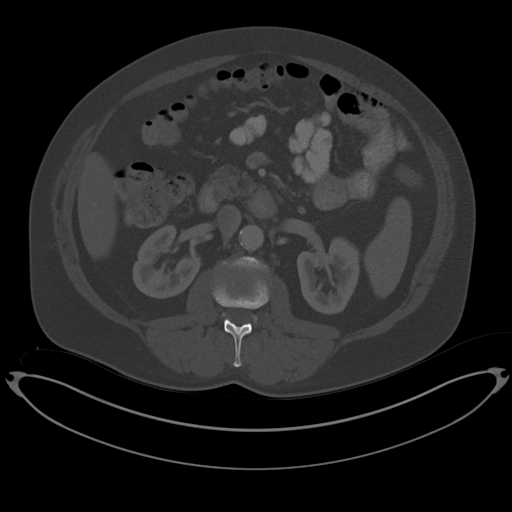
[im 72/102  soft-tissue]
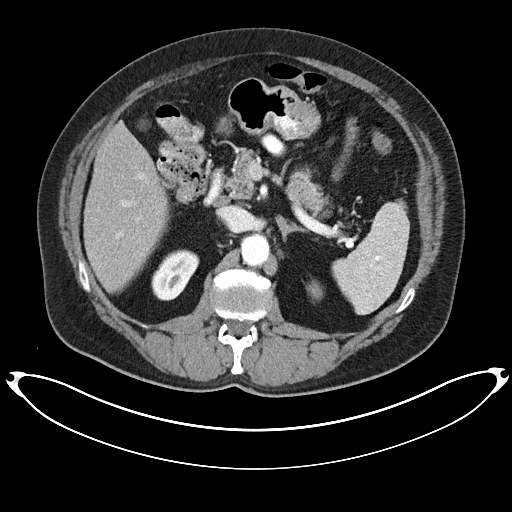
[im 78/102  soft-tissue]
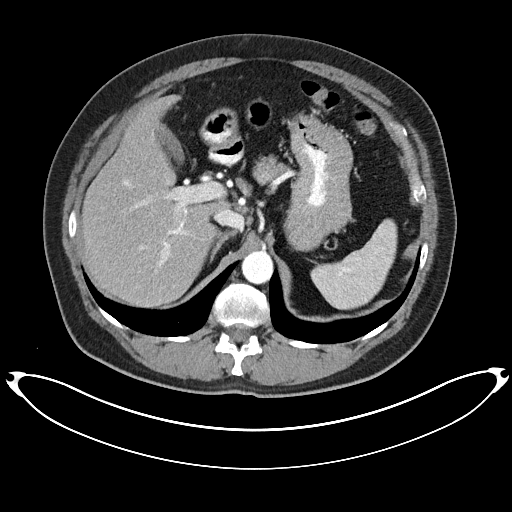
[im 90/102  soft-tissue]
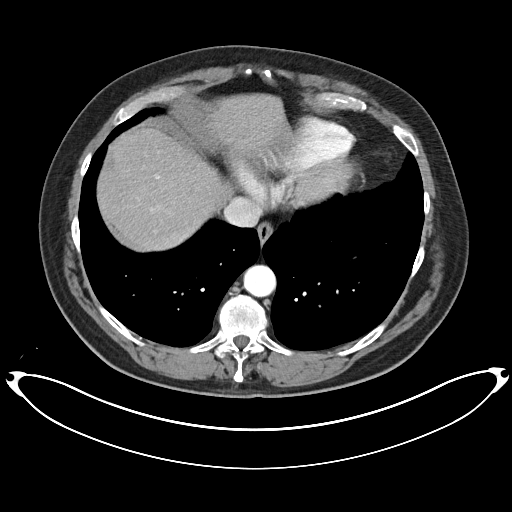
[im 96/102  soft-tissue]
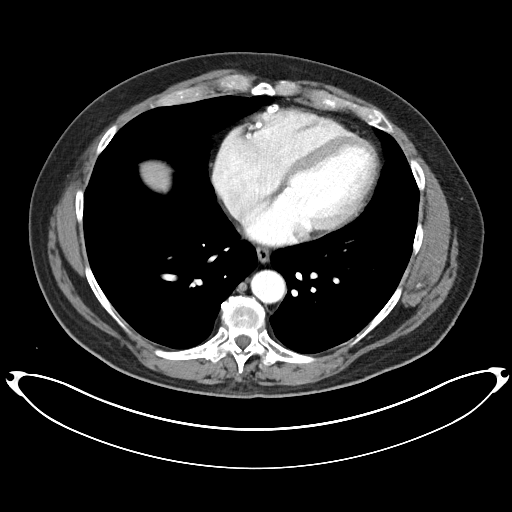

[Series 4: coronals abd pelvis 2.00 cor · coronal · 0.87mm/px · 3 of 169 slices shown]
[im 57/169  soft-tissue]
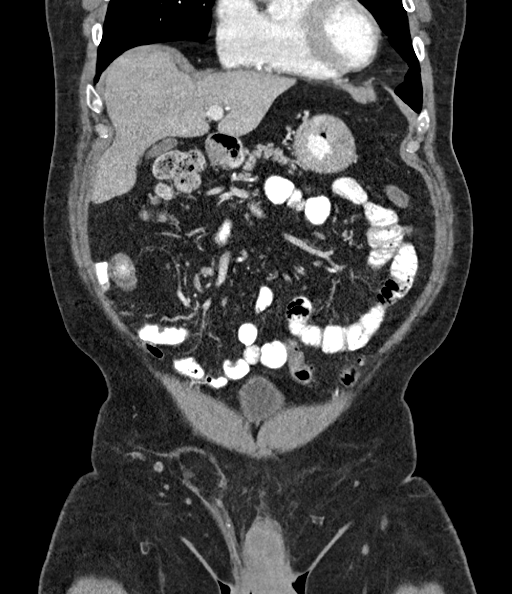
[im 75/169  soft-tissue]
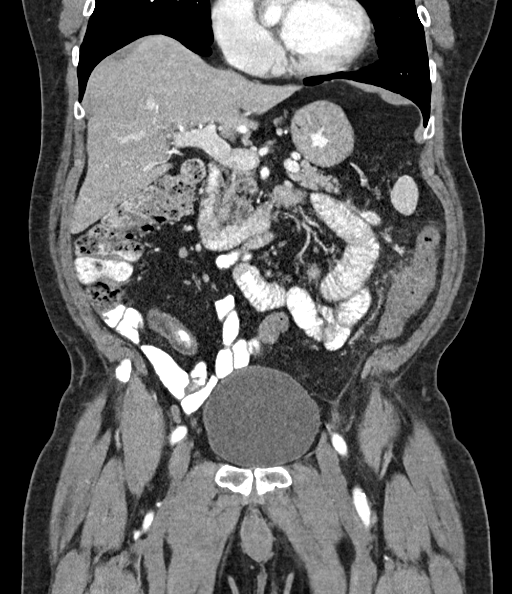
[im 94/169  soft-tissue]
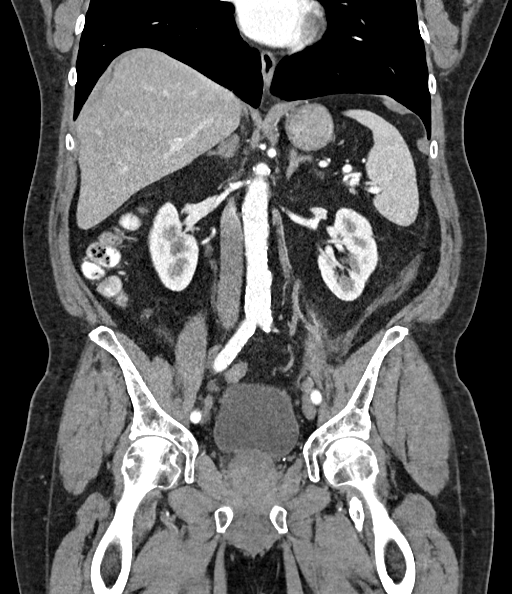

[16 of 46 positions shown; findings below may reference images not displayed]

FINDINGS: Lower chest: The visualized lung bases are clear. Multi vessel
coronary vascular calcification or stent noted.

No intra-abdominal free air or free fluid.

Hepatobiliary: Several subcentimeter hypodense lesions in the liver
are too small to characterize but appears similar prior CT and most
likely represent cysts. The liver is otherwise unremarkable. No
intrahepatic biliary ductal dilatation. The gallbladder is
unremarkable.

Pancreas: Unremarkable. No pancreatic ductal dilatation or
surrounding inflammatory changes.

Spleen: Normal in size without focal abnormality.

Adrenals/Urinary Tract: The adrenal glands are unremarkable. The
kidneys, visualized ureters, and urinary bladder appear
unremarkable. Subcentimeter bilateral renal hypodense lesions are
not well characterized but likely represent cysts.

Stomach/Bowel: There is distal colonic diverticulosis. There is
active inflammatory changes of descending colon centered around a
distal descending diverticula most consistent with acute
diverticulitis. No drainable fluid collection/abscess or evidence of
perforation. Chronic thickened appearance of the distal/terminal
ileum similar to prior CT likely related to chronic/recurrent
inflammation. Correlation with history of Crohn's recommended. There
is no bowel obstruction. The appendix is normal.

Vascular/Lymphatic: Moderate aortoiliac atherosclerotic disease. The
IVC is unremarkable. No portal venous gas. There is no adenopathy.

Reproductive: The prostate and seminal vesicles are grossly
unremarkable. No pelvic mass.

Other: Small fat containing umbilical hernia.

Musculoskeletal: Old right posterior rib fractures. No acute osseous
pathology.
IMPRESSION: 1. Acute diverticulitis of the descending colon. No abscess or
evidence of perforation.
2. Thickened appearance of the distal/terminal ileum similar to
prior CT likely related to chronic/recurrent inflammation.
Correlation with history of Crohn's recommended. No bowel
obstruction. Normal appendix.
3. Aortic atherosclerosis.

Aortic Atherosclerosis ([id number]).

## 2022-04-07 ENCOUNTER — Ambulatory Visit: Payer: Medicare HMO

## 2022-05-15 ENCOUNTER — Ambulatory Visit (INDEPENDENT_AMBULATORY_CARE_PROVIDER_SITE_OTHER): Payer: Medicare PPO | Admitting: Internal Medicine

## 2022-05-15 ENCOUNTER — Encounter: Payer: Self-pay | Admitting: Internal Medicine

## 2022-05-15 VITALS — BP 134/74 | HR 68 | Ht 69.0 in | Wt 221.8 lb

## 2022-05-15 DIAGNOSIS — K5792 Diverticulitis of intestine, part unspecified, without perforation or abscess without bleeding: Secondary | ICD-10-CM | POA: Diagnosis not present

## 2022-05-15 MED ORDER — ONDANSETRON HCL 4 MG PO TABS: 4.0000 mg | ORAL_TABLET | Freq: Three times a day (TID) | ORAL | 0 refills | Status: DC | PRN

## 2022-05-15 MED ORDER — AMOXICILLIN-POT CLAVULANATE 875-125 MG PO TABS: 1.0000 | ORAL_TABLET | Freq: Two times a day (BID) | ORAL | 0 refills | Status: AC

## 2022-05-15 NOTE — Progress Notes (Signed)
Date:  05/15/2022   Name:  Jonathan Escobar   DOB:  1946-03-26   MRN:  [id number]   Chief Complaint: Diverticulitis (Sxs of lethargy, chills, lower abdominal and back cramping, constipation, and nausea. Called Dr Allen Norris for augmentin, but did not hear back from their office. )  Abdominal Pain This is a new problem. The current episode started in the past 7 days. The onset quality is gradual. The problem has been rapidly worsening. The pain is located in the generalized abdominal region and RUQ. The pain is mild. The quality of the pain is colicky, cramping and a sensation of fullness. The abdominal pain does not radiate. Associated symptoms include flatus and nausea. Pertinent negatives include no diarrhea, fever or vomiting. Associated symptoms comments: Also fatigue and chills. Nothing aggravates the pain. The pain is relieved by Nothing. diverticulosis/diverticulitis    Lab Results  Component Value Date   NA 128 (L) 08/12/2021   K 4.3 08/12/2021   CO2 23 08/12/2021   GLUCOSE 97 08/12/2021   BUN 11 08/12/2021   CREATININE 1.02 08/12/2021   CALCIUM 9.5 08/12/2021   EGFR 77 08/12/2021   GFRNONAA 74 06/18/2020   Lab Results  Component Value Date   CHOL 146 08/12/2021   HDL 49 08/12/2021   LDLCALC 76 08/12/2021   TRIG 114 08/12/2021   CHOLHDL 3.0 08/12/2021   Lab Results  Component Value Date   TSH 1.300 06/18/2020   Lab Results  Component Value Date   HGBA1C 5.9 (H) 08/12/2021   Lab Results  Component Value Date   WBC 8.0 08/12/2021   HGB 13.8 08/12/2021   HCT 39.2 08/12/2021   MCV 90 08/12/2021   PLT 264 08/12/2021   Lab Results  Component Value Date   ALT 26 08/12/2021   AST 22 08/12/2021   ALKPHOS 74 08/12/2021   BILITOT 0.5 08/12/2021   No results found for: "25OHVITD2", "25OHVITD3", "VD25OH"   Review of Systems  Constitutional:  Positive for chills and fatigue. Negative for fever.  Respiratory:  Positive for shortness of breath. Negative for chest  tightness.   Gastrointestinal:  Positive for abdominal pain, flatus and nausea. Negative for blood in stool, diarrhea and vomiting.  Psychiatric/Behavioral:  Negative for dysphoric mood and sleep disturbance. The patient is not nervous/anxious.     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 04/29/2012   H/O: GI bleed - 2007 on Plavix 04/25/2012    Allergies  Allergen Reactions   Norvasc [Amlodipine Besylate]     Dizzy   Ace Inhibitors Other (See Comments)    Cough     Past Surgical History:  Procedure Laterality Date   CARDIAC CATHETERIZATION  04/28/12   RCA: 95% ISR followed by 100% stenosis post-stent with significant RPL lesions; LAD: Tandem 80 and 70% lesions involving D1 (1, 1, 1) -->  initial angioplasty to RCA aspiration thrombectomy --> urgent CABG;   CORONARY ARTERY BYPASS GRAFT  04/28/2012   Procedure: CORONARY ARTERY BYPASS GRAFTING (CABG);  Surgeon: Gaye Pollack, MD;  Location: [place];  Service: Open Heart Surgery;  Laterality: N/A;   HERNIA REPAIR Left    INGUINAL HERNIA REPAIR Right 08/06/2016   Procedure: HERNIA REPAIR INGUINAL ADULT;  Surgeon: Clayburn Pert, MD;  Location: [place];  Service: General;  Laterality: Right;   LEFT AND RIGHT HEART CATHETERIZATION WITH CORONARY ANGIOGRAM N/A  04/25/2012   Procedure: LEFT AND RIGHT HEART CATHETERIZATION WITH CORONARY ANGIOGRAM;  Surgeon: Leonie Man, MD;  Location: [facility] CATH LAB;  Service: Cardiovascular;  Laterality: N/A;   LEFT HEART CATHETERIZATION WITH CORONARY/GRAFT ANGIOGRAM  07/29/2013   Procedure: LEFT HEART CATHETERIZATION WITH Beatrix Fetters;  Surgeon: Blane Ohara, MD;  [facility];  Service: Cardiovascular; ; 95% stenosis with percent mid occlusion of SVG-RPDA; 90% anastomotic SVG-diagonal, moderate 50% disease in the native circumflex with patent OM 1 OM 2. 80-90% proximal LAD with patent LIMA to LAD , EF 50%   MOHS SURGERY     FOREHEAD   NM MYOVIEW LTD  Jan 2015   Moderate Inferior infarct (as expected), but no ischemia & EF ~45-50%   PERCUTANEOUS CORONARY STENT INTERVENTION (PCI-S)  07/29/2013   Procedure: PERCUTANEOUS CORONARY STENT INTERVENTION (PCI-S);  Surgeon: Blane Ohara, MD;  Location: [facility] CATH LAB;  Service: Cardiovascular;; PCI to SVG-RPDA: Promus DES - 3 mm x 38 mm, 3 mm x 38 mm, 3 mm x 20 mm, 2.75 mm x 38 mm -- all postdilated to ~3.3 mm;   PERCUTANEOUS CORONARY STENT INTERVENTION (PCI-S) N/A 08/01/2013   Procedure: PERCUTANEOUS CORONARY STENT INTERVENTION (PCI-S);  Surgeon: Leonie Man, MD;  Location: [facility] CATH LAB;  Service: Cardiovascular;staged PCI to distal SVG-diagonal: Xience Xpedition 2.5 mm x 20 mm (2.66 mm)     TRANSTHORACIC ECHOCARDIOGRAM  12/'13; 07/30/2013   a) Post CABG: EF 50-55%, moderate HK of anteroseptal wall, septal dyskinesis/dyssynergy due to poststernotomy;  grade 1 diastolic dysfunction. Mildly dilated LA, mildly reduced RV function-likely due to septal dyssynergy.; b) 11/'14 Post Inferior STEMI: EF 50-55% mild LVH. Mild hypokinesis of the distal lateral, and basal to mid inferior lateral and inferior walls -- consistent with RCA infarct    Social History   Tobacco Use   Smoking status: Former    Packs/day: 1.00    Years: 10.00    Total pack years: 10.00     Types: Cigarettes    Quit date: 09/01/1976    Years since quitting: 45.7   Smokeless tobacco: Never  Vaping Use   Vaping Use: Never used  Substance Use Topics   Alcohol use: Yes    Alcohol/week: 2.0 standard drinks of alcohol    Types: 2 Shots of liquor per week    Comment: 1-2 shots of jack daniels a day.   Drug use: No     Medication list has been reviewed and updated.  Current Meds  Medication Sig   acetaminophen (TYLENOL) 325 MG tablet Take 2 tablets (650 mg total) by mouth every 4 (four) hours as needed for headache or mild pain.   Ascorbic Acid (VITAMIN C) 1000 MG tablet Take 1,000 mg by mouth daily.   aspirin 81 MG tablet Take 81 mg by mouth daily.   atorvastatin (LIPITOR) 10 MG tablet TAKE 1 TABLET BY MOUTH EVERY DAY   cetirizine (ZYRTEC) 10 MG tablet Take 10 mg by  mouth daily as needed for allergies. PM   clopidogrel (PLAVIX) 75 MG tablet TAKE 1 TABLET (75 MG TOTAL) BY MOUTH DAILY WITH BREAKFAST.   ketoconazole (NIZORAL) 2 % cream APPLY TO AFFECTED AREA EVERY DAY   metoprolol succinate (TOPROL-XL) 25 MG 24 hr tablet Take 1 tablet (25 mg total) by mouth daily. Take with or immediately following a meal.   NITROSTAT 0.4 MG SL tablet PLACE 1 TABLET UNDER THE TONGUE EVERY 5 MINUTES X 3 DOSES AS NEEDED FOR CHEST PAIN.   pantoprazole (PROTONIX) 40 MG tablet Take 1 tablet (40 mg total) by mouth daily at 6 (six) AM.   sildenafil (VIAGRA) 100 MG tablet Take by mouth.   traZODone (DESYREL) 50 MG tablet Take 50 mg by mouth at bedtime.   valsartan (DIOVAN) 320 MG tablet TAKE 1 TABLET BY MOUTH EVERY DAY   [DISCONTINUED] hydrochlorothiazide (MICROZIDE) 12.5 MG capsule Take 12.5 mg by mouth daily.   [DISCONTINUED] Omega-3 Fatty Acids (FISH OIL PO) Take 2 capsules by mouth daily.   [DISCONTINUED] promethazine-dextromethorphan (PROMETHAZINE-DM) 6.25-15 MG/5ML syrup Take 5 mLs by mouth 4 (four) times daily as needed for cough.       08/12/2021   10:43 AM 03/12/2020    8:06 AM  GAD 7 :  Generalized Anxiety Score  Nervous, Anxious, on Edge 0 0  Control/stop worrying 0 0  Worry too much - different things 0 0  Trouble relaxing 0 0  Restless 0 0  Easily annoyed or irritable 0 0  Afraid - awful might happen 0 0  Total GAD 7 Score 0 0  Anxiety Difficulty Not difficult at all Not difficult at all       08/12/2021   10:42 AM 03/12/2020    8:06 AM 03/07/2019    8:06 AM  Depression screen PHQ 2/9  Decreased Interest 0 0 0  Down, Depressed, Hopeless 0 0 0  PHQ - 2 Score 0 0 0  Altered sleeping 0 0 0  Tired, decreased energy 0 0 0  Change in appetite 0 0 0  Feeling bad or failure about yourself  0 0 0  Trouble concentrating 0 0 0  Moving slowly or fidgety/restless 0 0 0  Suicidal thoughts 0 0 0  PHQ-9 Score 0 0 0  Difficult doing work/chores Not difficult at all Not difficult at all Not difficult at all    BP Readings from Last 3 Encounters:  05/15/22 134/74  08/12/21 138/70  07/23/21 (!) 158/84    Physical Exam Vitals and nursing note reviewed.  Constitutional:      General: He is not in acute distress.    Appearance: He is well-developed. He is not ill-appearing.  HENT:     Head: Normocephalic and atraumatic.  Cardiovascular:     Rate and Rhythm: Normal rate and regular rhythm.  Pulmonary:     Effort: Pulmonary effort is normal. No respiratory distress.     Breath sounds: No wheezing or rhonchi.  Abdominal:     General: Abdomen is flat. Bowel sounds are normal.     Palpations: Abdomen is soft.     Tenderness: There is abdominal tenderness in the left lower quadrant. There is no guarding or rebound.  Skin:    General: Skin is warm and dry.     Findings: No rash.  Neurological:     Mental Status: He is alert and oriented to person, place, and time.  Psychiatric:        Mood and Affect: Mood normal.  Behavior: Behavior normal.     Wt Readings from Last 3 Encounters:  05/15/22 221 lb 12.8 oz (100.6 kg)  08/12/21 227 lb 6.4 oz (103.1 kg)   07/23/21 225 lb 4 oz (102.2 kg)    BP 134/74   Pulse 68   Ht '5\' 9"'  (1.753 m)   Wt 221 lb 12.8 oz (100.6 kg)   SpO2 96%   BMI 32.75 kg/m   Assessment and Plan: 1. Diverticulitis Treat with rest, fluids, bland diet  Augmentin and zofran Go to ED if symptoms worsen or unable to take medications  - amoxicillin-clavulanate (AUGMENTIN) 875-125 MG tablet; Take 1 tablet by mouth 2 (two) times daily for 10 days.  Dispense: 20 tablet; Refill: 0 - ondansetron (ZOFRAN) 4 MG tablet; Take 1 tablet (4 mg total) by mouth every 8 (eight) hours as needed for nausea or vomiting.  Dispense: 20 tablet; Refill: 0   Partially dictated using Editor, commissioning. Any errors are unintentional.  Halina Maidens, MD [facility]  05/15/2022

## 2022-06-09 ENCOUNTER — Telehealth: Payer: Self-pay | Admitting: Internal Medicine

## 2022-06-09 NOTE — Telephone Encounter (Signed)
Patient declined the Medicare Wellness Visit with [place] he does not need to complete due to seeing two providers.

## 2022-08-14 ENCOUNTER — Encounter: Payer: Medicare HMO | Admitting: Internal Medicine

## 2022-10-07 ENCOUNTER — Ambulatory Visit: Payer: Medicare HMO | Attending: Cardiovascular Disease | Admitting: Cardiovascular Disease

## 2022-10-07 ENCOUNTER — Encounter: Payer: Self-pay | Admitting: Cardiovascular Disease

## 2022-10-07 VITALS — BP 160/92 | HR 59 | Ht 69.0 in | Wt 223.1 lb

## 2022-10-07 DIAGNOSIS — E785 Hyperlipidemia, unspecified: Secondary | ICD-10-CM | POA: Diagnosis not present

## 2022-10-07 DIAGNOSIS — R002 Palpitations: Secondary | ICD-10-CM | POA: Diagnosis not present

## 2022-10-07 DIAGNOSIS — I25118 Atherosclerotic heart disease of native coronary artery with other forms of angina pectoris: Secondary | ICD-10-CM

## 2022-10-07 DIAGNOSIS — I10 Essential (primary) hypertension: Secondary | ICD-10-CM | POA: Diagnosis not present

## 2022-10-07 MED ORDER — CARVEDILOL 6.25 MG PO TABS: 6.2500 mg | ORAL_TABLET | Freq: Two times a day (BID) | ORAL | 3 refills | Status: DC

## 2022-10-07 NOTE — Patient Instructions (Signed)
Medication Instructions:  STOP the Metoprolol  START Carvedilol 6.25 twice daily  *If you need a refill on your cardiac medications before your next appointment, please call your pharmacy*   Lab Work: None ordered If you have labs (blood work) drawn today and your tests are completely normal, you will receive your results only by: [place] (if you have MyChart) OR A paper copy in the mail If you have any lab test that is abnormal or we need to change your treatment, we will call you to review the results.   Testing/Procedures: Order for a [employer] has been provided   Follow-Up: At [facility], you and your health needs are our priority.  As part of our continuing mission to provide you with exceptional heart care, we have created designated Provider Care Teams.  These Care Teams include your primary Cardiologist (physician) and Advanced Practice Providers (APPs -  Physician Assistants and Nurse Practitioners) who all work together to provide you with the care you need, when you need it.  We recommend signing up for the patient portal called "MyChart".  Sign up information is provided on this After Visit Summary.  MyChart is used to connect with patients for Virtual Visits (Telemedicine).  Patients are able to view lab/test results, encounter notes, upcoming appointments, etc.  Non-urgent messages can be sent to your provider as well.   To learn more about what you can do with MyChart, go to [email or web address].    Your next appointment:   12 month(s)  Provider:   You may see Kathlyn Sacramento, MD or one of the following Advanced Practice Providers on your designated Care Team:   Murray Hodgkins, NP Christell Faith, PA-C Cadence Kathlen Mody, PA-C Gerrie Nordmann, NP

## 2022-10-07 NOTE — Progress Notes (Signed)
Cardiology Office Note   Date:  10/07/2022   ID:  Jonathan Escobar, Jonathan Escobar 04/11/1946, MRN [id number]  PCP:  Glean Hess, MD  Cardiologist:   Kathlyn Sacramento, MD   Chief Complaint  Patient presents with   Other    12 month f/u c/o fatigue, lightheaded and sob. Meds reviewed verbally with pt.      History of Present Illness: Jonathan Escobar is a 77 y.o. male who presents for a follow up visit regarding coronary artery disease. He is a former CT surgical PA with extensive cardiac history:  Initial cardiac event was in 1999 with bare-metal stent to the proximal RCA -> had brachytherapy in 2000 and 2003 then redo PCI with a Taxus DES in 2005 for ISR. Inferior STEMI 04/25/2012 - 95% ISR followed by 5% stenosis post stent with lesions in the PL and PDA. Also noted 70% mid LAD; after aspiration thrombectomy, balloon angioplasty performed and he was sent for urgent CABG  (LIMA-LAD, SVG-PDA and PLA, SVG-Diag)  Inferior STEMI in November 2014 with occluded SVG-PDA --> extensive PCI (full metal jacket) of SVG followed by staged PCI of anastomotic SVG-diagonal lesion. No cardiac events since then.   He is known to have short runs of SVT and PACs controlled with metoprolol.  Carvedilol was tried for elevated blood pressure but he did not tolerate the medication was switched back to metoprolol.  He has known history of essential hypertension with poor tolerance to medications.  He tried small dose amlodipine and small dose hydrochlorothiazide but did not feel well with both medications.  He reports having an upper respiratory tract infection recently and since then he has been more fatigued with occasional substernal discomfort with exertion.  He does not have as much stamina as before.  Blood pressure is more controlled at home but his diastolic blood pressure is frequently above 90.   Past Medical History:  Diagnosis Date   CAD (coronary artery disease), autologous vein bypass graft Nov  2014   Inf STEMI - PCI to SVG-RPDA:    CAD in native artery - prior Inferior MI - RCA PCI; 04/2012 - Inferior STEMI - RCA occluded (unable to dilate lesion adequately) + LAD & D1 disease --> referred for CABG 04/07/2012   CAD S/P percutaneous coronary angioplasty 1999   BMS to prox RCA 1999; Brachytherapy ~2003, followed by DES  PCI (Taxus) for ISR.   Coronary stent restenosis due to progression of disease 2000; 2005   (While Still In [place]) Status post Brachytherapy to RCA ISR - 2000; Redo PCI with DES 2005   Diverticulitis    Essential hypertension    CONTROLLED ON MEDS   GERD (gastroesophageal reflux disease)    History of GI bleed     while on Plavix   History of viral meningitis    following GI bleed    History of: ST elevation myocardial infarction (STEMI) of inferior wall, subsequent episode of care April 25, 2012;   RCA: 95% ISR followed by 100% stenosis post-stent with significant RPL lesions; LAD: Tandem 80 and 70% lesions involving D1 (1, 1, 1) --> initial angioplasty to RCA aspiration thrombectomy --> urgent CABG;   Hyperlipidemia LDL goal <70    Non-recurrent unilateral inguinal hernia without obstruction or gangrene    Obesity (BMI 30.0-34.9)    Right inguinal hernia 07/02/2016   S/P CABG x 4: LIMA-LAD, seq SVG-PD-PLA; SVG-diag. 04/28/2012   S/P coronary artery stent placement Nov-Dec 2014   a) Inf  STEMIL 11/28/'14 --> extensive (full metal Jacket) of SVG-RPDA Promus DES - 3 mm x 38 mm, 3 mm x 38 mm, 3 mm x 20 mm, 2.75 mm x 38 mm -- all postdilated to ~3.3 mm;; b) 12/1/'14: staged PCI to distal SVG-diagonal: Xience Xpedition 2.5 mm x 20 mm (2.66 mm)   ST elevation myocardial infarction (STEMI) of inferior wall, initial episode of care - s/p PTCA of 95% ISR to 70% and 100% distal stent occlusion to ~40% (8/25) 04/25/2012   ST elevation myocardial infarction (STEMI) of inferior wall, subsequent episode of care [facility])  Jul 29, 2013   Diffuse near occlusion of SVG-rPDA, 90% SVG-Diag    STEMI 07/29/13- successful complex full metal jacket stenting of the SVG-PDA/PLA -07/29/2013, staged SVG-Dx PCI 12/01 07/29/2013   Supraventricular tachycardia     Past Surgical History:  Procedure Laterality Date   CARDIAC CATHETERIZATION  04/28/12   RCA: 95% ISR followed by 100% stenosis post-stent with significant RPL lesions; LAD: Tandem 80 and 70% lesions involving D1 (1, 1, 1) --> initial angioplasty to RCA aspiration thrombectomy --> urgent CABG;   CORONARY ARTERY BYPASS GRAFT  04/28/2012   Procedure: CORONARY ARTERY BYPASS GRAFTING (CABG);  Surgeon: Gaye Pollack, MD;  Location: [place];  Service: Open Heart Surgery;  Laterality: N/A;   HERNIA REPAIR Left    INGUINAL HERNIA REPAIR Right 08/06/2016   Procedure: HERNIA REPAIR INGUINAL ADULT;  Surgeon: Clayburn Pert, MD;  Location: [place];  Service: General;  Laterality: Right;   LEFT AND RIGHT HEART CATHETERIZATION WITH CORONARY ANGIOGRAM N/A 04/25/2012   Procedure: LEFT AND RIGHT HEART CATHETERIZATION WITH CORONARY ANGIOGRAM;  Surgeon: Leonie Man, MD;  Location: [facility] CATH LAB;  Service: Cardiovascular;  Laterality: N/A;   LEFT HEART CATHETERIZATION WITH CORONARY/GRAFT ANGIOGRAM  07/29/2013   Procedure: LEFT HEART CATHETERIZATION WITH Beatrix Fetters;  Surgeon: Blane Ohara, MD;  Location: [facility] CATH LAB;  Service: Cardiovascular; ; 95% stenosis with percent mid occlusion of SVG-RPDA; 90% anastomotic SVG-diagonal, moderate 50% disease in the native circumflex with patent OM 1 OM 2. 80-90% proximal LAD with patent LIMA to LAD , EF 50%   MOHS SURGERY     FOREHEAD   NM MYOVIEW LTD  Jan 2015   Moderate Inferior infarct (as expected), but no ischemia & EF ~45-50%   PERCUTANEOUS CORONARY STENT INTERVENTION (PCI-S)  07/29/2013   Procedure: PERCUTANEOUS CORONARY STENT INTERVENTION (PCI-S);  Surgeon: Blane Ohara, MD;  Location: [facility] CATH LAB;  Service: Cardiovascular;; PCI to SVG-RPDA: Promus DES - 3 mm x 38 mm, 3 mm x  38 mm, 3 mm x 20 mm, 2.75 mm x 38 mm -- all postdilated to ~3.3 mm;   PERCUTANEOUS CORONARY STENT INTERVENTION (PCI-S) N/A 08/01/2013   Procedure: PERCUTANEOUS CORONARY STENT INTERVENTION (PCI-S);  Surgeon: Leonie Man, MD;  Location[facility] CATH LAB;  Service: Cardiovascular;staged PCI to distal SVG-diagonal: Xience Xpedition 2.5 mm x 20 mm (2.66 mm)     TRANSTHORACIC ECHOCARDIOGRAM  12/'13; 07/30/2013   a) Post CABG: EF 50-55%, moderate HK of anteroseptal wall, septal dyskinesis/dyssynergy due to poststernotomy;  grade 1 diastolic dysfunction. Mildly dilated LA, mildly reduced RV function-likely due to septal dyssynergy.; b) 11/'14 Post Inferior STEMI: EF 50-55% mild LVH. Mild hypokinesis of the distal lateral, and basal to mid inferior lateral and inferior walls -- consistent with RCA infarct     Current Outpatient Medications  Medication Sig Dispense Refill   acetaminophen (TYLENOL) 325 MG tablet Take 2 tablets (650  mg total) by mouth every 4 (four) hours as needed for headache or mild pain.     Ascorbic Acid (VITAMIN C) 1000 MG tablet Take 1,000 mg by mouth daily.     aspirin 81 MG tablet Take 81 mg by mouth daily.     atorvastatin (LIPITOR) 10 MG tablet TAKE 1 TABLET BY MOUTH EVERY DAY 90 tablet 3   cetirizine (ZYRTEC) 10 MG tablet Take 10 mg by mouth daily as needed for allergies. PM     clopidogrel (PLAVIX) 75 MG tablet TAKE 1 TABLET (75 MG TOTAL) BY MOUTH DAILY WITH BREAKFAST. 90 tablet 0   ezetimibe (ZETIA) 10 MG tablet Take 1 tablet (10 mg total) by mouth daily. 90 tablet 3   metoprolol succinate (TOPROL-XL) 25 MG 24 hr tablet Take 1 tablet (25 mg total) by mouth daily. Take with or immediately following a meal. 90 tablet 3   NITROSTAT 0.4 MG SL tablet PLACE 1 TABLET UNDER THE TONGUE EVERY 5 MINUTES X 3 DOSES AS NEEDED FOR CHEST PAIN. 25 tablet 3   pantoprazole (PROTONIX) 40 MG tablet Take 1 tablet (40 mg total) by mouth daily at 6 (six) AM. 90 tablet 3   sildenafil (VIAGRA) 100 MG  tablet Take by mouth.     traZODone (DESYREL) 50 MG tablet Take 50 mg by mouth at bedtime.     valsartan (DIOVAN) 320 MG tablet TAKE 1 TABLET BY MOUTH EVERY DAY 90 tablet 2   No current facility-administered medications for this visit.    Allergies:   Norvasc [amlodipine besylate] and Ace inhibitors    Social History:  The patient  reports that he quit smoking about 46 years ago. His smoking use included cigarettes. He has a 10.00 pack-year smoking history. He has never used smokeless tobacco. He reports current alcohol use of about 2.0 standard drinks of alcohol per week. He reports that he does not use drugs.   Family History:  The patient's family history includes Alzheimer's disease in his mother; Coronary artery disease in his father; Heart failure in his father; Valvular heart disease in his sister.    ROS:  Please see the history of present illness.   Otherwise, review of systems are positive for none.   All other systems are reviewed and negative.    PHYSICAL EXAM: VS:  BP (!) 160/92 (BP Location: Left Arm, Patient Position: Sitting, Cuff Size: Normal)   Pulse (!) 59   Ht '5\' 9"'$  (1.753 m)   Wt 223 lb 2 oz (101.2 kg)   SpO2 97%   BMI 32.95 kg/m  , BMI Body mass index is 32.95 kg/m. GEN: Well nourished, well developed, in no acute distress  HEENT: normal  Neck: no JVD,  or masses. Faint bilateral carotid bruits Cardiac: RRR; no murmurs, rubs, or gallops,no edema  Respiratory:  clear to auscultation bilaterally, normal work of breathing GI: soft, nontender, nondistended, + BS MS: no deformity or atrophy  Skin: warm and dry, no rash Neuro:  Strength and sensation are intact Psych: euthymic mood, full affect   EKG:  EKG is ordered today. The ekg ordered today demonstrates sinus bradycardia with PACs.  Recent Labs: No results found for requested labs within last 365 days.    Lipid Panel    Component Value Date/Time   CHOL 146 08/12/2021 1115   CHOL 153 09/07/2012  1201   TRIG 114 08/12/2021 1115   TRIG 185 09/07/2012 1201   HDL 49 08/12/2021 1115   HDL 31 (L)  09/07/2012 1201   CHOLHDL 3.0 08/12/2021 1115   CHOLHDL 2.9 03/12/2020 0920   VLDL 20 03/12/2020 0920   VLDL 37 09/07/2012 1201   LDLCALC 76 08/12/2021 1115   LDLCALC 85 09/07/2012 1201      Wt Readings from Last 3 Encounters:  10/07/22 223 lb 2 oz (101.2 kg)  05/15/22 221 lb 12.8 oz (100.6 kg)  08/12/21 227 lb 6.4 oz (103.1 kg)          No data to display            ASSESSMENT AND PLAN:  1.  Palpitations: These were thought to be due to short runs of SVT as well as PACs.  He is mildly bradycardic on Toprol with elevated blood pressure.  I decided to switch him from Toprol to small dose carvedilol 6.25 mg twice daily to see how he tolerates this medication.  2. Coronary artery disease involving bypass graft with other forms of angina: He reports worsening exertional dyspnea and some episodes of exertional substernal discomfort .  Given extensive cardiac history and revascularization, recommend evaluation with a Lexiscan Myoview.  He is not confident he will be able to get his heart rate up with exercise on a treadmill.  3. Hyperlipidemia: He is tolerating small dose of atorvastatin and Zetia.  Most recent lipid profile showed an LDL of 77.  4. Essential hypertension: His blood pressure is elevated.  I switched Toprol to carvedilol.     Disposition:   FU with me in 12 months  Signed,  Kathlyn Sacramento, MD  10/07/2022 4:08 PM    [facility]

## 2022-10-08 ENCOUNTER — Telehealth: Payer: Self-pay | Admitting: Cardiovascular Disease

## 2022-10-08 NOTE — Telephone Encounter (Signed)
Per char review, Dr. Fletcher Anon recommended a Lexiscan Myoview.  Pt calling for CPT code

## 2022-10-08 NOTE — Telephone Encounter (Signed)
[employer] called and is asking patient be called back to be given CPT codes for a nuclear test that is to be scheduled.

## 2022-10-11 ENCOUNTER — Encounter: Payer: Self-pay | Admitting: Cardiovascular Disease

## 2022-10-22 ENCOUNTER — Ambulatory Visit: Payer: Medicare HMO | Admitting: Dermatology

## 2022-10-22 ENCOUNTER — Encounter: Payer: Self-pay | Admitting: Dermatology

## 2022-10-22 VITALS — BP 147/80 | HR 60

## 2022-10-22 DIAGNOSIS — L304 Erythema intertrigo: Secondary | ICD-10-CM

## 2022-10-22 MED ORDER — KETOCONAZOLE 2 % EX CREA: 1.0000 | TOPICAL_CREAM | Freq: Two times a day (BID) | CUTANEOUS | 11 refills | Status: AC

## 2022-10-22 MED ORDER — FLUCONAZOLE 200 MG PO TABS: 200.0000 mg | ORAL_TABLET | Freq: Every day | ORAL | 0 refills | Status: AC

## 2022-10-22 MED ORDER — HYDROCORTISONE 2.5 % EX CREA: TOPICAL_CREAM | Freq: Two times a day (BID) | CUTANEOUS | 11 refills | Status: DC | PRN

## 2022-10-22 NOTE — Progress Notes (Signed)
   Follow-Up Visit   Subjective  Jonathan Escobar is a 77 y.o. male who presents for the following: Rash (Groin. 2-3 months. Was seen by dermatologist at [facility], not improving. Was given Miconazole 2%, has tried OTC spray for jock itch. More painful than itchy. Waxes and wanes ).    The following portions of the chart were reviewed this encounter and updated as appropriate:      Review of Systems: No other skin or systemic complaints except as noted in HPI or Assessment and Plan.   Objective  Well appearing patient in no apparent distress; mood and affect are within normal limits.  A focused examination was performed including groin. Relevant physical exam findings are noted in the Assessment and Plan.  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 Condition is bothersome/symptomatic for patient. Currently flared.  Intertrigo is a chronic recurrent rash that occurs in skin fold areas that may be associated with friction; heat; moisture; yeast; fungus; and bacteria.  It is exacerbated by increased movement / activity; sweating; and higher atmospheric temperature.   Start Diflucan 200 mg take one tablet daily for 7 days.   Start Ketoconazole 2% cream apply twice daily to affected areas   Start Hydrocortisone 2.5% cream twice daily up to 2 weeks as needed for rash. Caution skin atrophy with long-term use.   Recommend OTC Zeasorb AF powder to body folds daily after shower.  It is often found in the athlete's foot section in the pharmacy.  Avoid using powders that contain cornstarch.  Side effects of fluconazole (diflucan) include nausea, diarrhea, headache, dizziness, taste changes, rare risk of irritation of the liver, allergy, or decreased blood counts (which could show up as infection or  tiredness).  Topical steroids (such as triamcinolone, fluocinolone, fluocinonide, mometasone, clobetasol, halobetasol, betamethasone, hydrocortisone) can cause thinning and lightening of the skin if they are used for too long in the same area. Your physician has selected the right strength medicine for your problem and area affected on the body. Please use your medication only as directed by your physician to prevent side effects.     fluconazole (DIFLUCAN) 200 MG tablet - B/L Inguinal Area Take 1 tablet (200 mg total) by mouth daily for 7 days.  ketoconazole (NIZORAL) 2 % cream - B/L Inguinal Area Apply 1 Application topically 2 (two) times daily.  hydrocortisone 2.5 % cream - B/L Inguinal Area Apply topically 2 (two) times daily as needed (Rash). Use up to 2 weeks   Return if symptoms worsen or fail to improve.  I, Emelia Salisbury, CMA, am acting as scribe for Brendolyn Patty, MD.  Documentation: I have reviewed the above documentation for accuracy and completeness, and I agree with the above.  Brendolyn Patty MD

## 2022-10-22 NOTE — Patient Instructions (Addendum)
Intertrigo  Mix hydrocortisone with ketaconazole 2% twice a day. If improved, decrease to hydrocortisone and ketaconazole mixed once a day. If still clear, decrease to ketaconazole only.  Start Diflucan 200 mg take one tablet daily for 7 days.   Start Ketoconazole 2% cream apply twice daily to affected areas   Start Hydrocortisone 2.5% cream twice daily up to 2 weeks as needed for rash.  Recommend OTC Zeasorb AF powder to body folds daily after shower.  It is often found in the athlete's foot section in the pharmacy.  Avoid using powders that contain cornstarch.  Intertrigo is a chronic recurrent rash that occurs in skin fold areas that may be associated with friction; heat; moisture; yeast; fungus; and bacteria.  It is exacerbated by increased movement / activity; sweating; and higher atmospheric temperature.  Side effects of fluconazole (diflucan) include nausea, diarrhea, headache, dizziness, taste changes, rare risk of irritation of the liver, allergy, or decreased blood counts (which could show up as infection or tiredness).   Topical steroids (such as triamcinolone, fluocinolone, fluocinonide, mometasone, clobetasol, halobetasol, betamethasone, hydrocortisone) can cause thinning and lightening of the skin if they are used for too long in the same area. Your physician has selected the right strength medicine for your problem and area affected on the body. Please use your medication only as directed by your physician to prevent side effects.   Due to recent changes in healthcare laws, you may see results of your pathology and/or laboratory studies on MyChart before the doctors have had a chance to review them. We understand that in some cases there may be results that are confusing or concerning to you. Please understand that not all results are received at the same time and often the doctors may need to interpret multiple results in order to provide you with the best plan of care or course of  treatment. Therefore, we ask that you please give [place] 2 business days to thoroughly review all your results before contacting the office for clarification. Should we see a critical lab result, you will be contacted sooner.   If You Need Anything After Your Visit  If you have any questions or concerns for your doctor, please call our main line at [phone] and press option 4 to reach your doctor's medical assistant. If no one answers, please leave a voicemail as directed and we will return your call as soon as possible. Messages left after 4 pm will be answered the following business day.   You may also send [place] a message via [place]. We typically respond to MyChart messages within 1-2 business days.  For prescription refills, please ask your pharmacy to contact our office. Our fax number is [phone].  If you have an urgent issue when the clinic is closed that cannot wait until the next business day, you can page your doctor at the number below.    Please note that while we do our best to be available for urgent issues outside of office hours, we are not available 24/7.   If you have an urgent issue and are unable to reach [place], you may choose to seek medical care at your doctor's office, retail clinic, urgent care center, or emergency room.  If you have a medical emergency, please immediately call 911 or go to the emergency department.  Pager Numbers  - Dr. Nehemiah Massed: [phone]  - Dr. Laurence Ferrari: [phone]  - Dr. Nicole Kindred: [phone]  In the event of inclement weather, please call our main line at  [phone] for an update on the status of any delays or closures.  Dermatology Medication Tips: Please keep the boxes that topical medications come in in order to help keep track of the instructions about where and how to use these. Pharmacies typically print the medication instructions only on the boxes and not directly on the medication tubes.   If your medication is too expensive,  please contact our office at [phone] option 4 or send [place] a message through [place].   We are unable to tell what your co-pay for medications will be in advance as this is different depending on your insurance coverage. However, we may be able to find a substitute medication at lower cost or fill out paperwork to get insurance to cover a needed medication.   If a prior authorization is required to get your medication covered by your insurance company, please allow [place] 1-2 business days to complete this process.  Drug prices often vary depending on where the prescription is filled and some pharmacies may offer cheaper prices.  The website www.goodrx.com contains coupons for medications through different pharmacies. The prices here do not account for what the cost may be with help from insurance (it may be cheaper with your insurance), but the website can give you the price if you did not use any insurance.  - You can print the associated coupon and take it with your prescription to the pharmacy.  - You may also stop by our office during regular business hours and pick up a GoodRx coupon card.  - If you need your prescription sent electronically to a different pharmacy, notify our office through [facility] or by phone at [phone] option 4.     Si Usted Necesita Algo Despus de Su Visita  Tambin puede enviarnos un mensaje a travs de Pharmacist, community. Por lo general respondemos a los mensajes de MyChart en el transcurso de 1 a 2 das hbiles.  Para renovar recetas, por favor pida a su farmacia que se ponga en contacto con nuestra oficina. Harland Dingwall de fax es [place] [phone].  Si tiene un asunto urgente cuando la clnica est cerrada y que no puede esperar hasta el siguiente da hbil, puede llamar/localizar a su doctor(a) al nmero que aparece a continuacin.   Por favor, tenga en cuenta que aunque hacemos todo lo posible para estar disponibles para asuntos urgentes fuera del  horario de [place], no estamos disponibles las 24 horas del da, los 7 das de la [place].   Si tiene un problema urgente y no puede comunicarse con nosotros, puede optar por buscar atencin mdica  en el consultorio de su doctor(a), en una clnica privada, en un centro de atencin urgente o en una sala de emergencias.  Si tiene Engineering geologist, por favor llame inmediatamente al 911 o vaya a la sala de emergencias.  Nmeros de bper  - Dr. Nehemiah Massed: [phone]  - Dra. Moye: [phone]  - Dra. Nicole Kindred: [phone]  En caso de inclemencias del [place], por favor llame a Johnsie Kindred principal al [phone] para una actualizacin sobre el [place] de cualquier retraso o cierre.  Consejos para la medicacin en dermatologa: Por favor, guarde las cajas en las que vienen los medicamentos de uso tpico para ayudarle a seguir las instrucciones sobre dnde y cmo usarlos. Las farmacias generalmente imprimen las instrucciones del medicamento slo en las cajas y no directamente en los tubos del [place].   Si su medicamento es [employer], por favor, pngase en contacto con  nuestra oficina llamando al [phone] y presione la opcin 4 o envenos un mensaje a travs de Pharmacist, community.   No podemos decirle cul ser su copago por los medicamentos por adelantado ya que esto es diferente dependiendo de la cobertura de su seguro. Sin embargo, es posible que podamos encontrar un medicamento sustituto a Electrical engineer un formulario para que el seguro cubra el medicamento que se considera necesario.   Si se requiere una autorizacin previa para que su compaa de seguros [place] su medicamento, por favor permtanos de 1 a 2 das hbiles para completar este proceso.  Los precios de los medicamentos varan con frecuencia dependiendo del Environmental consultant de dnde se surte la receta y alguna farmacias pueden ofrecer precios ms baratos.  El sitio web www.goodrx.com tiene cupones para medicamentos de Office manager. Los precios aqu no tienen en cuenta lo que podra costar con la ayuda del seguro (puede ser ms barato con su seguro), pero el sitio web puede darle el precio si no utiliz Research scientist (physical sciences).  - Puede imprimir el cupn correspondiente y llevarlo con su receta a la farmacia.  - Tambin puede pasar por nuestra oficina durante el horario de atencin regular y Charity fundraiser una tarjeta de cupones de GoodRx.  - Si necesita que su receta se enve electrnicamente a una farmacia diferente, informe a nuestra oficina a travs de MyChart de [place] o por telfono llamando al [phone] y presione la opcin 4.

## 2022-11-06 ENCOUNTER — Ambulatory Visit (INDEPENDENT_AMBULATORY_CARE_PROVIDER_SITE_OTHER): Payer: Medicare HMO | Admitting: Internal Medicine

## 2022-11-06 ENCOUNTER — Encounter: Payer: Self-pay | Admitting: Internal Medicine

## 2022-11-06 VITALS — BP 126/68 | HR 65 | Ht 69.0 in | Wt 222.0 lb

## 2022-11-06 DIAGNOSIS — K219 Gastro-esophageal reflux disease without esophagitis: Secondary | ICD-10-CM

## 2022-11-06 DIAGNOSIS — D6869 Other thrombophilia: Secondary | ICD-10-CM | POA: Insufficient documentation

## 2022-11-06 DIAGNOSIS — I77810 Thoracic aortic ectasia: Secondary | ICD-10-CM

## 2022-11-06 DIAGNOSIS — Z0000 Encounter for general adult medical examination without abnormal findings: Secondary | ICD-10-CM | POA: Diagnosis not present

## 2022-11-06 DIAGNOSIS — Z125 Encounter for screening for malignant neoplasm of prostate: Secondary | ICD-10-CM

## 2022-11-06 DIAGNOSIS — E785 Hyperlipidemia, unspecified: Secondary | ICD-10-CM | POA: Diagnosis not present

## 2022-11-06 DIAGNOSIS — I10 Essential (primary) hypertension: Secondary | ICD-10-CM

## 2022-11-06 DIAGNOSIS — Z0100 Encounter for examination of eyes and vision without abnormal findings: Secondary | ICD-10-CM | POA: Diagnosis not present

## 2022-11-06 DIAGNOSIS — R7303 Prediabetes: Secondary | ICD-10-CM | POA: Insufficient documentation

## 2022-11-06 NOTE — Assessment & Plan Note (Signed)
Symptoms well controlled on daily PPI No red flag signs such as weight loss, n/v, melena

## 2022-11-06 NOTE — Assessment & Plan Note (Addendum)
Tolerating zetia and statin medications without concerns LDL is  Lab Results  Component Value Date   LDLCALC 76 08/12/2021   with a goal of < 55 Current dose will be adjusted if needed.

## 2022-11-06 NOTE — Assessment & Plan Note (Addendum)
Clinically stable exam with well controlled BP on coreg and Diovan. Metoprolol was stopped and Coreg started Tolerating medications without side effects. Pt to continue current regimen and low sodium diet.

## 2022-11-06 NOTE — Assessment & Plan Note (Signed)
On Plavix and ASA No bleeding issues noted

## 2022-11-06 NOTE — Progress Notes (Signed)
Date:  11/06/2022   Name:  Jonathan Escobar   DOB:  [birth year]   MRN:  [id number]   Chief Complaint: Annual Exam  Jonathan Escobar is a 77 y.o. male who presents today for his Complete Annual Exam. He feels well. 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 He reports he is sleeping well.   Colonoscopy: 09/15/2018 repeat 5 yrs  Immunization History  Administered Date(s) Administered   Influenza-Unspecified 07/02/2009, 07/26/2009, 06/01/2010, 07/14/2011, 06/01/2013, 06/30/2013, 06/30/2014, 06/23/2015, 06/16/2016, 06/08/2017, 05/02/2018, 06/01/2020, 06/01/2021, 06/12/2022   PFIZER(Purple Top)SARS-COV-2 Vaccination 08/23/2019, 09/13/2019   Pneumococcal Conjugate-13 11/17/2014   Pneumococcal Polysaccharide-23 12/31/2010, 03/26/2016   Pneumococcal-Unspecified 08/12/2011   Tdap 02/06/2008, 11/28/2009, 12/31/2014   There are no preventive care reminders to display for this patient.    Hypertension This is a chronic problem. The problem is controlled. Pertinent negatives include no chest pain, headaches, palpitations or shortness of breath.  Hyperlipidemia This is a chronic problem. The problem is controlled. Pertinent negatives include no chest pain, myalgias or shortness of breath. Current antihyperlipidemic treatment includes statins and ezetimibe.      Lab Results  Component Value Date   NA 128 (L) 08/12/2021   K 4.3 08/12/2021   CO2 23 08/12/2021   GLUCOSE 97 08/12/2021   BUN 11 08/12/2021   CREATININE 1.02 08/12/2021   CALCIUM 9.5 08/12/2021   EGFR 77 08/12/2021   GFRNONAA 74 06/18/2020   Lab Results  Component Value Date   CHOL 146 08/12/2021   HDL 49 08/12/2021   LDLCALC 76 08/12/2021   TRIG 114 08/12/2021   CHOLHDL 3.0 08/12/2021   Lab Results  Component Value Date   TSH 1.300 06/18/2020   Lab Results  Component Value Date   HGBA1C 5.9 (H) 08/12/2021   Lab Results  Component Value Date   WBC 8.0 08/12/2021   HGB 13.8 08/12/2021   HCT 39.2 08/12/2021    MCV 90 08/12/2021   PLT 264 08/12/2021   Lab Results  Component Value Date   ALT 26 08/12/2021   AST 22 08/12/2021   ALKPHOS 74 08/12/2021   BILITOT 0.5 08/12/2021   No results found for: "25OHVITD2", "25OHVITD3", "VD25OH"   Review of Systems  Constitutional:  Negative for appetite change, chills, diaphoresis, fatigue and unexpected weight change.  HENT:  Negative for hearing loss, tinnitus, trouble swallowing and voice change.   Eyes:  Negative for visual disturbance.  Respiratory:  Negative for choking, shortness of breath and wheezing.   Cardiovascular:  Negative for chest pain, palpitations and leg swelling.  Gastrointestinal:  Negative for abdominal pain, blood in stool, constipation and diarrhea.  Genitourinary:  Negative for difficulty urinating, dysuria and frequency.  Musculoskeletal:  Negative for arthralgias, back pain and myalgias.  Skin:  Negative for color change and rash.  Neurological:  Negative for dizziness, syncope and headaches.  Hematological:  Negative for adenopathy.  Psychiatric/Behavioral:  Negative for dysphoric mood and sleep disturbance. The patient is not nervous/anxious.     Patient Active Problem List   Diagnosis Date Noted   Prediabetes 11/06/2022   Acquired thrombophilia ([place]) 11/06/2022   Tinnitus 08/12/2021   Trigger thumb, left thumb 08/12/2021   Benign essential microscopic hematuria 03/12/2020   Pain in left knee 08/23/2019   Atherosclerosis of aorta ([place]) 03/07/2019   GERD (gastroesophageal reflux disease) 01/19/2018   Personal history of other malignant neoplasm of skin 10/08/2015   Essential hypertension 07/19/2014   Fatigue 01/05/2014   Heart palpitations 09/10/2013  Obesity (BMI 30-39.9) 08/09/2013   CAD (coronary artery disease), autologous vein bypass graft --> full metal jacket PCI of SVG-RCA; PCI of SVG-D1 anastomosis 07/02/2013   Hyperlipidemia LDL goal <70    S/P CABG x 4, 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  04/29/2012   H/O: GI bleed - 2007 on Plavix 04/25/2012    Allergies  Allergen Reactions   Norvasc [Amlodipine Besylate]     Dizzy   Ace Inhibitors Other (See Comments)    Cough    Crestor [Rosuvastatin] Other (See Comments)    Muscle pain    Past Surgical History:  Procedure Laterality Date   CARDIAC CATHETERIZATION  04/28/12   RCA: 95% ISR followed by 100% stenosis post-stent with significant RPL lesions; LAD: Tandem 80 and 70% lesions involving D1 (1, 1, 1) --> initial angioplasty to RCA aspiration thrombectomy --> urgent CABG;   CORONARY ARTERY BYPASS GRAFT  04/28/2012   Procedure: CORONARY ARTERY BYPASS GRAFTING (CABG);  Surgeon: Gaye Pollack, MD;  Location: [facility];  Service: Open Heart Surgery;  Laterality: N/A;   HERNIA REPAIR Left    INGUINAL HERNIA REPAIR Right 08/06/2016   Procedure: HERNIA REPAIR INGUINAL ADULT;  Surgeon: Clayburn Pert, MD;  Location: [facility];  Service: General;  Laterality: Right;   LEFT AND RIGHT HEART CATHETERIZATION WITH CORONARY ANGIOGRAM N/A 04/25/2012   Procedure: LEFT AND RIGHT HEART CATHETERIZATION WITH CORONARY ANGIOGRAM;  Surgeon: Leonie Man, MD;  Location: [facility] CATH LAB;  Service: Cardiovascular;  Laterality: N/A;   LEFT HEART CATHETERIZATION WITH CORONARY/GRAFT ANGIOGRAM  07/29/2013   Procedure: LEFT HEART CATHETERIZATION WITH Beatrix Fetters;  Surgeon: Blane Ohara, MD;  Location[facility] C[facility] LAB;  Service: Cardiovascular; ; 95% stenosis with percent mid occlusion of SVG-RPDA; 90% anastomotic SVG-diagonal, moderate 50% disease in the native circumflex with patent OM 1 OM 2. 80-90% proximal LAD with patent LIMA to LAD , EF 50%   MOHS SURGERY     FOREHEAD   NM MYOVIEW LTD  Jan 2015   Moderate Inferior infarct (as expected), but no ischemia & EF ~45-50%   PERCUTANEOUS CORONARY STENT INTERVENTION (PCI-S)  07/29/2013   Procedure: PERCUTANEOUS CORONARY STENT INTERVENTION (PCI-S);  Surgeon: Blane Ohara, MD;  Location: [facility]  CATH LAB;  Service: Cardiovascular;; PCI to SVG-RPDA: Promus DES - 3 mm x 38 mm, 3 mm x 38 mm, 3 mm x 20 mm, 2.75 mm x 38 mm -- all postdilated to ~3.3 mm;   PERCUTANEOUS CORONARY STENT INTERVENTION (PCI-S) N/A 08/01/2013   Procedure: PERCUTANEOUS CORONARY STENT INTERVENTION (PCI-S);  Surgeon: Leonie Man, MD;  Location: [facility] CATH LAB;  Service: Cardiovascular;staged PCI to distal SVG-diagonal: Xience Xpedition 2.5 mm x 20 mm (2.66 mm)     TRANSTHORACIC ECHOCARDIOGRAM  12/'13; 07/30/2013   a) Post CABG: EF 50-55%, moderate HK of anteroseptal wall, septal dyskinesis/dyssynergy due to poststernotomy;  grade 1 diastolic dysfunction. Mildly dilated LA, mildly reduced RV function-likely due to septal dyssynergy.; b) 11/'14 Post Inferior STEMI: EF 50-55% mild LVH. Mild hypokinesis of the distal lateral, and basal to mid inferior lateral and inferior walls -- consistent with RCA infarct    Social History   Tobacco Use   Smoking status: Former    Packs/day: 1.00    Years: 10.00    Total pack years: 10.00    Types: Cigarettes    Quit date: 09/01/1976    Years since quitting: 46.2   Smokeless tobacco: Never  Vaping Use   Vaping Use: Never used  Substance Use Topics   Alcohol use: Yes    Alcohol/week: 2.0 standard drinks of alcohol    Types: 2 Shots of liquor per week    Comment: 1-2 shots of jack daniels a day.   Drug use: No     Medication list has been reviewed and updated.  Current Meds  Medication Sig   acetaminophen (TYLENOL) 325 MG tablet Take 2 tablets (650 mg total) by mouth every 4 (four) hours as needed for headache or mild pain.   Ascorbic Acid (VITAMIN C) 1000 MG tablet Take 1,000 mg by mouth daily.   aspirin 81 MG tablet Take 81 mg by mouth daily.   atorvastatin (LIPITOR) 10 MG tablet TAKE 1 TABLET BY MOUTH EVERY DAY   carvedilol (COREG) 6.25 MG tablet Take 1 tablet (6.25 mg total) by mouth 2 (two) times daily.   cetirizine (ZYRTEC) 10 MG tablet Take 10 mg by mouth daily as  needed for allergies. PM   clopidogrel (PLAVIX) 75 MG tablet TAKE 1 TABLET (75 MG TOTAL) BY MOUTH DAILY WITH BREAKFAST.   ezetimibe (ZETIA) 10 MG tablet Take 1 tablet (10 mg total) by mouth daily.   hydrocortisone 2.5 % cream Apply topically 2 (two) times daily as needed (Rash). Use up to 2 weeks   ketoconazole (NIZORAL) 2 % cream Apply 1 Application topically 2 (two) times daily.   NITROSTAT 0.4 MG SL tablet PLACE 1 TABLET UNDER THE TONGUE EVERY 5 MINUTES X 3 DOSES AS NEEDED FOR CHEST PAIN.   pantoprazole (PROTONIX) 40 MG tablet Take 1 tablet (40 mg total) by mouth daily at 6 (six) AM.   sildenafil (VIAGRA) 100 MG tablet Take by mouth.   traZODone (DESYREL) 50 MG tablet Take 50 mg by mouth at bedtime.   valsartan (DIOVAN) 320 MG tablet TAKE 1 TABLET BY MOUTH EVERY DAY       11/06/2022   10:58 AM 08/12/2021   10:43 AM 03/12/2020    8:06 AM  GAD 7 : Generalized Anxiety Score  Nervous, Anxious, on Edge 0 0 0  Control/stop worrying 0 0 0  Worry too much - different things 0 0 0  Trouble relaxing 0 0 0  Restless 0 0 0  Easily annoyed or irritable 0 0 0  Afraid - awful might happen 0 0 0  Total GAD 7 Score 0 0 0  Anxiety Difficulty Not difficult at all Not difficult at all Not difficult at all       11/06/2022   10:57 AM 08/12/2021   10:42 AM 03/12/2020    8:06 AM  Depression screen PHQ 2/9  Decreased Interest 0 0 0  Down, Depressed, Hopeless 0 0 0  PHQ - 2 Score 0 0 0  Altered sleeping 0 0 0  Tired, decreased energy 0 0 0  Change in appetite 0 0 0  Feeling bad or failure about yourself  0 0 0  Trouble concentrating 0 0 0  Moving slowly or fidgety/restless 0 0 0  Suicidal thoughts 0 0 0  PHQ-9 Score 0 0 0  Difficult doing work/chores Not difficult at all Not difficult at all Not difficult at all    BP Readings from Last 3 Encounters:  11/06/22 126/68  10/22/22 (!) 147/80  10/07/22 (!) 160/92    Physical Exam Vitals and nursing note reviewed.  Constitutional:       Appearance: Normal appearance. He is well-developed.  HENT:     Head: Normocephalic.     Right Ear: Tympanic  membrane, ear canal and external ear normal.     Left Ear: Tympanic membrane, ear canal and external ear normal.     Nose: Nose normal.  Eyes:     Conjunctiva/sclera: Conjunctivae normal.     Pupils: Pupils are equal, round, and reactive to light.  Neck:     Thyroid: No thyromegaly.     Vascular: No carotid bruit.  Cardiovascular:     Rate and Rhythm: Normal rate and regular rhythm.     Heart sounds: Normal heart sounds.  Pulmonary:     Effort: Pulmonary effort is normal.     Breath sounds: Normal breath sounds. No wheezing.  Chest:  Breasts:    Right: No mass.     Left: No mass.  Abdominal:     General: Bowel sounds are normal.     Palpations: Abdomen is soft.     Tenderness: There is no abdominal tenderness.  Musculoskeletal:        General: Normal range of motion.     Cervical back: Normal range of motion and neck supple.  Lymphadenopathy:     Cervical: No cervical adenopathy.  Skin:    General: Skin is warm and dry.  Neurological:     Mental Status: He is alert and oriented to person, place, and time.     Deep Tendon Reflexes: Reflexes are normal and symmetric.  Psychiatric:        Attention and Perception: Attention normal.        Mood and Affect: Mood normal.        Thought Content: Thought content normal.     Wt Readings from Last 3 Encounters:  11/06/22 222 lb (100.7 kg)  10/07/22 223 lb 2 oz (101.2 kg)  05/15/22 221 lb 12.8 oz (100.6 kg)    BP 126/68   Pulse 65   Ht '5\' 9"'$  (1.753 m)   Wt 222 lb (100.7 kg)   SpO2 99%   BMI 32.78 kg/m   Assessment and Plan: Problem List Items Addressed This Visit       Cardiovascular and Mediastinum   Essential hypertension (Chronic)    Clinically stable exam with well controlled BP on coreg and Diovan. Metoprolol was stopped and Coreg started Tolerating medications without side effects. Pt to continue  current regimen and low sodium diet.       Relevant Orders   CBC with Differential/Platelet   Comprehensive metabolic panel   Urinalysis, Routine w reflex microscopic     Digestive   GERD (gastroesophageal reflux disease)    Symptoms well controlled on daily PPI No red flag signs such as weight loss, n/v, melena         Hematopoietic and Hemostatic   Acquired thrombophilia ([place])    On Plavix and ASA No bleeding issues noted        Other   Prediabetes   Relevant Orders   Hemoglobin A1c   Hyperlipidemia LDL goal <70 (Chronic)    Tolerating zetia and statin medications without concerns LDL is  Lab Results  Component Value Date   LDLCALC 76 08/12/2021  with a goal of < 55 Current dose will be adjusted if needed.       Relevant Orders   Lipid panel   Other Visit Diagnoses     Annual physical exam    -  Primary   Prostate cancer screening       Relevant Orders   PSA   Dilated aortic root ([place])   (Chronic)  followed by Cardiology        Partially dictated using Dragon software. Any errors are unintentional.  Halina Maidens, MD [facility]  11/06/2022

## 2022-11-07 LAB — COMPREHENSIVE METABOLIC PANEL
ALT: 31 IU/L (ref 0–44)
AST: 30 IU/L (ref 0–40)
Albumin/Globulin Ratio: 1.3 (ref 1.2–2.2)
Albumin: 4.3 g/dL (ref 3.8–4.8)
Alkaline Phosphatase: 87 IU/L (ref 44–121)
BUN/Creatinine Ratio: 11 (ref 10–24)
BUN: 11 mg/dL (ref 8–27)
Bilirubin Total: 0.6 mg/dL (ref 0.0–1.2)
CO2: 21 mmol/L (ref 20–29)
Calcium: 9.7 mg/dL (ref 8.6–10.2)
Chloride: 101 mmol/L (ref 96–106)
Creatinine, Ser: 1.02 mg/dL (ref 0.76–1.27)
Globulin, Total: 3.4 g/dL (ref 1.5–4.5)
Glucose: 100 mg/dL — ABNORMAL HIGH (ref 70–99)
Potassium: 4.6 mmol/L (ref 3.5–5.2)
Sodium: 137 mmol/L (ref 134–144)
Total Protein: 7.7 g/dL (ref 6.0–8.5)
eGFR: 77 mL/min/{1.73_m2} (ref >59)

## 2022-11-07 LAB — CBC WITH DIFFERENTIAL/PLATELET
Basophils Absolute: 0 10*3/uL (ref 0.0–0.2)
Basos: 1 %
EOS (ABSOLUTE): 0.3 10*3/uL (ref 0.0–0.4)
Eos: 5 %
Hematocrit: 41.3 % (ref 37.5–51.0)
Hemoglobin: 14.2 g/dL (ref 13.0–17.7)
Immature Grans (Abs): 0.1 10*3/uL (ref 0.0–0.1)
Immature Granulocytes: 1 %
Lymphocytes Absolute: 1.6 10*3/uL (ref 0.7–3.1)
Lymphs: 28 %
MCH: 32.2 pg (ref 26.6–33.0)
MCHC: 34.4 g/dL (ref 31.5–35.7)
MCV: 94 fL (ref 79–97)
Monocytes Absolute: 0.9 10*3/uL (ref 0.1–0.9)
Monocytes: 15 %
Neutrophils Absolute: 2.8 10*3/uL (ref 1.4–7.0)
Neutrophils: 50 %
Platelets: 255 10*3/uL (ref 150–450)
RBC: 4.41 x10E6/uL (ref 4.14–5.80)
RDW: 13.3 % (ref 11.6–15.4)
WBC: 5.6 10*3/uL (ref 3.4–10.8)

## 2022-11-07 LAB — URINALYSIS, ROUTINE W REFLEX MICROSCOPIC
Bilirubin, UA: NEGATIVE
Glucose, UA: NEGATIVE
Ketones, UA: NEGATIVE
Leukocytes,UA: NEGATIVE
Nitrite, UA: NEGATIVE
Protein,UA: NEGATIVE
RBC, UA: NEGATIVE
Specific Gravity, UA: 1.01 (ref 1.005–1.030)
Urobilinogen, Ur: 0.2 mg/dL (ref 0.2–1.0)
pH, UA: 6 (ref 5.0–7.5)

## 2022-11-07 LAB — LIPID PANEL
Chol/HDL Ratio: 3.2 ratio (ref 0.0–5.0)
Cholesterol, Total: 149 mg/dL (ref 100–199)
HDL: 46 mg/dL (ref >39)
LDL Chol Calc (NIH): 74 mg/dL (ref 0–99)
Triglycerides: 169 mg/dL — ABNORMAL HIGH (ref 0–149)
VLDL Cholesterol Cal: 29 mg/dL (ref 5–40)

## 2022-11-07 LAB — HEMOGLOBIN A1C
Est. average glucose Bld gHb Est-mCnc: 123 mg/dL
Hgb A1c MFr Bld: 5.9 % — ABNORMAL HIGH (ref 4.8–5.6)

## 2022-11-07 LAB — PSA: Prostate Specific Ag, Serum: 3.4 ng/mL (ref 0.0–4.0)

## 2022-11-13 ENCOUNTER — Encounter: Payer: Self-pay | Admitting: Cardiovascular Disease

## 2022-11-13 ENCOUNTER — Telehealth: Payer: Self-pay | Admitting: Cardiovascular Disease

## 2022-11-13 NOTE — Telephone Encounter (Signed)
Spoke with the patient and he stated he used his Kardiamobile device because he was feeling a "little bit" lightheaded and saw on his 2 lead EKG device that he had inverted T-waves. Patient denies angina, dyspnea, syncope, nausea, vomiting and uncontrolled pain. Patient would like any and all advise in the matter, patient also stated that he wanted to avoid going to the ED if at all possible.

## 2022-11-13 NOTE — Telephone Encounter (Signed)
STAT if patient feels like he/she is going to faint   Are you dizzy now? No  Do you feel faint or have you passed out? No  Do you have any other symptoms? Just dizziness and light headed  Have you checked your HR and BP (record if available)? No   Pt called stating they noticed a Inverted T Wave on their EKG and felt a little bit of dizziness this morning. Pt would like to speak with someone about this.

## 2022-11-13 NOTE — Telephone Encounter (Signed)
Called and spoke with the patient. He will send the EKG reading through MyChart for review.

## 2022-11-14 NOTE — Telephone Encounter (Signed)
The Kardia mobile is good for rhythm detection but not for evaluation of ST or T wave changes.  If he is having cardiac symptoms, he will need to come for an office visit with a twelve-lead EKG.

## 2022-11-14 NOTE — Telephone Encounter (Signed)
Patient is calling back for an update. Please advise  

## 2022-11-14 NOTE — Telephone Encounter (Signed)
Patient is following up, requesting a call back due to not hearing back from anyone regarding EKG.

## 2022-11-14 NOTE — Telephone Encounter (Signed)
Patient made aware. He denies any symptoms such as chest pain and shortness of breath but would feel better having an appointment. Appointment made for 3/19 with Dr. Fletcher Anon.

## 2022-11-18 ENCOUNTER — Ambulatory Visit: Payer: Medicare HMO | Attending: Cardiovascular Disease | Admitting: Cardiovascular Disease

## 2022-11-18 ENCOUNTER — Encounter: Payer: Self-pay | Admitting: Cardiovascular Disease

## 2022-11-18 VITALS — BP 148/88 | HR 62 | Ht 69.0 in | Wt 225.5 lb

## 2022-11-18 DIAGNOSIS — R002 Palpitations: Secondary | ICD-10-CM

## 2022-11-18 DIAGNOSIS — I25118 Atherosclerotic heart disease of native coronary artery with other forms of angina pectoris: Secondary | ICD-10-CM | POA: Diagnosis not present

## 2022-11-18 DIAGNOSIS — E785 Hyperlipidemia, unspecified: Secondary | ICD-10-CM

## 2022-11-18 DIAGNOSIS — I10 Essential (primary) hypertension: Secondary | ICD-10-CM | POA: Diagnosis not present

## 2022-11-18 DIAGNOSIS — R072 Precordial pain: Secondary | ICD-10-CM | POA: Diagnosis not present

## 2022-11-18 MED ORDER — ATORVASTATIN CALCIUM 20 MG PO TABS: 20.0000 mg | ORAL_TABLET | Freq: Every day | ORAL | 3 refills | Status: DC

## 2022-11-18 MED ORDER — NITROSTAT 0.4 MG SL SUBL: SUBLINGUAL_TABLET | SUBLINGUAL | 0 refills | Status: AC

## 2022-11-18 NOTE — H&P (View-Only) (Signed)
Cardiology Office Note   Date:  11/20/2022   ID:  Jonathan Escobar [birth year], MRN [id number]  PCP:  Glean Hess, MD  Cardiologist:   Kathlyn Sacramento, MD   Chief Complaint  Patient presents with   Other    Chest pressure with exertion, fatigue, lightheadedness and weakness. Meds reviewed verbally with pt.      History of Present Illness: Jonathan Escobar is a 77 y.o. male who presents for a follow up visit regarding coronary artery disease. He is a former CT surgical PA with extensive cardiac history:  Initial cardiac event was in 1999 with bare-metal stent to the proximal RCA -> had brachytherapy in 2000 and 2003 then redo PCI with a Taxus DES in 2005 for ISR. Inferior STEMI 04/25/2012 - 95% ISR followed by 5% stenosis post stent with lesions in the PL and PDA. Also noted 70% mid LAD; after aspiration thrombectomy, balloon angioplasty performed and he was sent for urgent CABG  (LIMA-LAD, SVG-PDA and PLA, SVG-Diag)  Inferior STEMI in November 2014 with occluded SVG-PDA --> extensive PCI (full metal jacket) of SVG followed by staged PCI of anastomotic SVG-diagonal lesion. No cardiac events since then.   He is known to have short runs of SVT and PACs.   He has known history of essential hypertension with poor tolerance to medications.  He tried small dose amlodipine and small dose hydrochlorothiazide but did not feel well with both medications.  During last visit, he reported mild exertional chest pain and shortness of breath.  Stress test was requested but has not been done yet as the patient wanted to have it done at the [place].  He was switched from small dose metoprolol to carvedilol due to borderline bradycardia.  He had some recent episodes of dizziness.  He has Kardia mobile at home and checked his rhythm which was sinus rhythm but he was worried about T wave inversion.  Past Medical History:  Diagnosis Date   CAD (coronary artery disease), autologous vein bypass graft  Nov 2014   Inf STEMI - PCI to SVG-RPDA:    CAD in native artery - prior Inferior MI - RCA PCI; 04/2012 - Inferior STEMI - RCA occluded (unable to dilate lesion adequately) + LAD & D1 disease --> referred for CABG 04/07/2012   CAD S/P percutaneous coronary angioplasty 1999   BMS to prox RCA 1999; Brachytherapy ~2003, followed by DES  PCI (Taxus) for ISR.   Coronary stent restenosis due to progression of disease 2000; 2005   (While Still In [place]) Status post Brachytherapy to RCA ISR - 2000; Redo PCI with DES 2005   Diverticulitis    Essential hypertension    CONTROLLED ON MEDS   GERD (gastroesophageal reflux disease)    History of GI bleed     while on Plavix   History of viral meningitis    following GI bleed    History of: ST elevation myocardial infarction (STEMI) of inferior wall, subsequent episode of care April 25, 2012;   RCA: 95% ISR followed by 100% stenosis post-stent with significant RPL lesions; LAD: Tandem 80 and 70% lesions involving D1 (1, 1, 1) --> initial angioplasty to RCA aspiration thrombectomy --> urgent CABG;   Hyperlipidemia LDL goal <70    Non-recurrent unilateral inguinal hernia without obstruction or gangrene    Obesity (BMI 30.0-34.9)    Right inguinal hernia 07/02/2016   S/P CABG x 4: LIMA-LAD, seq SVG-PD-PLA; SVG-diag. 04/28/2012   S/P coronary artery stent  placement Nov-Dec 2014   a) Inf STEMIL 11/28/'14 --> extensive (full metal Jacket) of SVG-RPDA Promus DES - 3 mm x 38 mm, 3 mm x 38 mm, 3 mm x 20 mm, 2.75 mm x 38 mm -- all postdilated to ~3.3 mm;; b) 12/1/'14: staged PCI to distal SVG-diagonal: Xience Xpedition 2.5 mm x 20 mm (2.66 mm)   ST elevation myocardial infarction (STEMI) of inferior wall, initial episode of care - s/p PTCA of 95% ISR to 70% and 100% distal stent occlusion to ~40% (8/25) 04/25/2012   ST elevation myocardial infarction (STEMI) of inferior wall, subsequent episode of care [facility])  Jul 29, 2013   Diffuse near occlusion of SVG-rPDA, 90%  SVG-Diag   STEMI 07/29/13- successful complex full metal jacket stenting of the SVG-PDA/PLA -07/29/2013, staged SVG-Dx PCI 12/01 07/29/2013   Supraventricular tachycardia     Past Surgical History:  Procedure Laterality Date   CARDIAC CATHETERIZATION  04/28/12   RCA: 95% ISR followed by 100% stenosis post-stent with significant RPL lesions; LAD: Tandem 80 and 70% lesions involving D1 (1, 1, 1) --> initial angioplasty to RCA aspiration thrombectomy --> urgent CABG;   CORONARY ARTERY BYPASS GRAFT  04/28/2012   Procedure: CORONARY ARTERY BYPASS GRAFTING (CABG);  Surgeon: Gaye Pollack, MD;  Location: [place];  Service: Open Heart Surgery;  Laterality: N/A;   HERNIA REPAIR Left    INGUINAL HERNIA REPAIR Right 08/06/2016   Procedure: HERNIA REPAIR INGUINAL ADULT;  Surgeon: Clayburn Pert, MD;  Location: [place];  Service: General;  Laterality: Right;   LEFT AND RIGHT HEART CATHETERIZATION WITH CORONARY ANGIOGRAM N/A 04/25/2012   Procedure: LEFT AND RIGHT HEART CATHETERIZATION WITH CORONARY ANGIOGRAM;  Surgeon: Leonie Man, MD;  Location: [facility] CATH LAB;  Service: Cardiovascular;  Laterality: N/A;   LEFT HEART CATHETERIZATION WITH CORONARY/GRAFT ANGIOGRAM  07/29/2013   Procedure: LEFT HEART CATHETERIZATION WITH Beatrix Fetters;  Surgeon: Blane Ohara, MD;  Location: [facility] CATH LAB;  Service: Cardiovascular; ; 95% stenosis with percent mid occlusion of SVG-RPDA; 90% anastomotic SVG-diagonal, moderate 50% disease in the native circumflex with patent OM 1 OM 2. 80-90% proximal LAD with patent LIMA to LAD , EF 50%   MOHS SURGERY     FOREHEAD   NM MYOVIEW LTD  Jan 2015   Moderate Inferior infarct (as expected), but no ischemia & EF ~45-50%   PERCUTANEOUS CORONARY STENT INTERVENTION (PCI-S)  07/29/2013   Procedure: PERCUTANEOUS CORONARY STENT INTERVENTION (PCI-S);  Surgeon: Blane Ohara, MD;  Location: [facility] CATH LAB;  Service: Cardiovascular;; PCI to SVG-RPDA: Promus DES - 3 mm x 38  mm, 3 mm x 38 mm, 3 mm x 20 mm, 2.75 mm x 38 mm -- all postdilated to ~3.3 mm;   PERCUTANEOUS CORONARY STENT INTERVENTION (PCI-S) N/A 08/01/2013   Procedure: PERCUTANEOUS CORONARY STENT INTERVENTION (PCI-S);  Surgeon: Leonie Man, MD;  Location: [facility] CATH LAB;  Service: Cardiovascular;staged PCI to distal SVG-diagonal: Xience Xpedition 2.5 mm x 20 mm (2.66 mm)     TRANSTHORACIC ECHOCARDIOGRAM  12/'13; 07/30/2013   a) Post CABG: EF 50-55%, moderate HK of anteroseptal wall, septal dyskinesis/dyssynergy due to poststernotomy;  grade 1 diastolic dysfunction. Mildly dilated LA, mildly reduced RV function-likely due to septal dyssynergy.; b) 11/'14 Post Inferior STEMI: EF 50-55% mild LVH. Mild hypokinesis of the distal lateral, and basal to mid inferior lateral and inferior walls -- consistent with RCA infarct     Current Outpatient Medications  Medication Sig Dispense Refill   acetaminophen (TYLENOL)  325 MG tablet Take 2 tablets (650 mg total) by mouth every 4 (four) hours as needed for headache or mild pain.     Ascorbic Acid (VITAMIN C) 1000 MG tablet Take 1,000 mg by mouth daily.     aspirin 81 MG tablet Take 81 mg by mouth daily.     carvedilol (COREG) 6.25 MG tablet Take 1 tablet (6.25 mg total) by mouth 2 (two) times daily. 180 tablet 3   cetirizine (ZYRTEC) 10 MG tablet Take 10 mg by mouth daily as needed for allergies. PM     clopidogrel (PLAVIX) 75 MG tablet TAKE 1 TABLET (75 MG TOTAL) BY MOUTH DAILY WITH BREAKFAST. 90 tablet 0   ezetimibe (ZETIA) 10 MG tablet Take 1 tablet (10 mg total) by mouth daily. 90 tablet 3   hydrocortisone 2.5 % cream Apply topically 2 (two) times daily as needed (Rash). Use up to 2 weeks 30 g 11   ketoconazole (NIZORAL) 2 % cream Apply 1 Application topically 2 (two) times daily. 60 g 11   pantoprazole (PROTONIX) 40 MG tablet Take 1 tablet (40 mg total) by mouth daily at 6 (six) AM. 90 tablet 3   sildenafil (VIAGRA) 100 MG tablet Take by mouth.     traZODone  (DESYREL) 50 MG tablet Take 50 mg by mouth at bedtime.     valsartan (DIOVAN) 320 MG tablet TAKE 1 TABLET BY MOUTH EVERY DAY 90 tablet 2   atorvastatin (LIPITOR) 20 MG tablet Take 1 tablet (20 mg total) by mouth daily. 90 tablet 3   NITROSTAT 0.4 MG SL tablet PLACE 1 TABLET UNDER THE TONGUE EVERY 5 MINUTES X 3 DOSES AS NEEDED FOR CHEST PAIN. 25 tablet 0   No current facility-administered medications for this visit.    Allergies:   Norvasc [amlodipine besylate], Ace inhibitors, and Crestor [rosuvastatin]    Social History:  The patient  reports that he quit smoking about 46 years ago. His smoking use included cigarettes. He has a 10.00 pack-year smoking history. He has never used smokeless tobacco. He reports current alcohol use of about 2.0 standard drinks of alcohol per week. He reports that he does not use drugs.   Family History:  The patient's family history includes Alzheimer's disease in his mother; Coronary artery disease in his father; Heart failure in his father; Valvular heart disease in his sister.    ROS:  Please see the history of present illness.   Otherwise, review of systems are positive for none.   All other systems are reviewed and negative.    PHYSICAL EXAM: VS:  BP (!) 148/88 (BP Location: Left Arm, Patient Position: Sitting)   Pulse 62   Ht 5\' 9"  (1.753 m)   Wt 225 lb 8 oz (102.3 kg)   SpO2 98%   BMI 33.30 kg/m  , BMI Body mass index is 33.3 kg/m. GEN: Well nourished, well developed, in no acute distress  HEENT: normal  Neck: no JVD,  or masses. Faint bilateral carotid bruits Cardiac: RRR; no murmurs, rubs, or gallops,no edema  Respiratory:  clear to auscultation bilaterally, normal work of breathing GI: soft, nontender, nondistended, + BS MS: no deformity or atrophy  Skin: warm and dry, no rash Neuro:  Strength and sensation are intact Psych: euthymic mood, full affect   EKG:  EKG is ordered today. The ekg ordered today demonstrates normal sinus rhythm  with a heart rate of 62 bpm.  Nonspecific T wave changes.  Recent Labs: 11/06/2022: ALT 31; BUN  11; Creatinine, Ser 1.02; Hemoglobin 14.2; Platelets 255; Potassium 4.6; Sodium 137    Lipid Panel    Component Value Date/Time   CHOL 149 11/06/2022 1127   CHOL 153 09/07/2012 1201   TRIG 169 (H) 11/06/2022 1127   TRIG 185 09/07/2012 1201   HDL 46 11/06/2022 1127   HDL 31 (L) 09/07/2012 1201   CHOLHDL 3.2 11/06/2022 1127   CHOLHDL 2.9 03/12/2020 0920   VLDL 20 03/12/2020 0920   VLDL 37 09/07/2012 1201   LDLCALC 74 11/06/2022 1127   LDLCALC 85 09/07/2012 1201      Wt Readings from Last 3 Encounters:  11/18/22 225 lb 8 oz (102.3 kg)  11/06/22 222 lb (100.7 kg)  10/07/22 223 lb 2 oz (101.2 kg)          No data to display            ASSESSMENT AND PLAN:  1.  Palpitations: These were thought to be due to short runs of SVT as well as PACs.  His symptoms are reasonably controlled with carvedilol.  I wonder if some of his dizziness might be related to the recent switch of metoprolol to carvedilol.  Will continue to monitor his symptoms for now.  2. Coronary artery disease involving bypass graft with other forms of angina: He has symptoms that are suggestive of class II angina.  I requested a treadmill nuclear stress test.  3. Hyperlipidemia: He did not tolerate with high-dose rosuvastatin in the past but has been doing well with low-dose atorvastatin and ezetimibe.  Recent lipid profile showed an LDL of 74.  I elected to increase atorvastatin to 20 mg daily.  4. Essential hypertension: His blood pressure is still mildly elevated.  Continue carvedilol and valsartan.  If blood pressure remains elevated, small dose amlodipine can be added.     Disposition:   FU with me in 6 months  Signed,  Kathlyn Sacramento, MD  11/20/2022 7:45 AM    Jonathan Escobar

## 2022-11-18 NOTE — Progress Notes (Unsigned)
Cardiology Office Note   Date:  11/20/2022   ID:  Jonathan Escobar, Jonathan Escobar 01/03/1946, MRN [id number]  PCP:  Glean Hess, MD  Cardiologist:   Kathlyn Sacramento, MD   Chief Complaint  Patient presents with   Other    Chest pressure with exertion, fatigue, lightheadedness and weakness. Meds reviewed verbally with pt.      History of Present Illness: Jonathan Escobar is a 77 y.o. male who presents for a follow up visit regarding coronary artery disease. He is a former CT surgical PA with extensive cardiac history:  Initial cardiac event was in 1999 with bare-metal stent to the proximal RCA -> had brachytherapy in 2000 and 2003 then redo PCI with a Taxus DES in 2005 for ISR. Inferior STEMI 04/25/2012 - 95% ISR followed by 5% stenosis post stent with lesions in the PL and PDA. Also noted 70% mid LAD; after aspiration thrombectomy, balloon angioplasty performed and he was sent for urgent CABG  (LIMA-LAD, SVG-PDA and PLA, SVG-Diag)  Inferior STEMI in November 2014 with occluded SVG-PDA --> extensive PCI (full metal jacket) of SVG followed by staged PCI of anastomotic SVG-diagonal lesion. No cardiac events since then.   He is known to have short runs of SVT and PACs.   He has known history of essential hypertension with poor tolerance to medications.  He tried small dose amlodipine and small dose hydrochlorothiazide but did not feel well with both medications.  During last visit, he reported mild exertional chest pain and shortness of breath.  Stress test was requested but has not been done yet as the patient wanted to have it done at the [place].  He was switched from small dose metoprolol to carvedilol due to borderline bradycardia.  He had some recent episodes of dizziness.  He has Kardia mobile at home and checked his rhythm which was sinus rhythm but he was worried about T wave inversion.  Past Medical History:  Diagnosis Date   CAD (coronary artery disease), autologous vein bypass graft  Nov 2014   Inf STEMI - PCI to SVG-RPDA:    CAD in native artery - prior Inferior MI - RCA PCI; 04/2012 - Inferior STEMI - RCA occluded (unable to dilate lesion adequately) + LAD & D1 disease --> referred for CABG 04/07/2012   CAD S/P percutaneous coronary angioplasty 1999   BMS to prox RCA 1999; Brachytherapy ~2003, followed by DES  PCI (Taxus) for ISR.   Coronary stent restenosis due to progression of disease 2000; 2005   (While Still In [place]) Status post Brachytherapy to RCA ISR - 2000; Redo PCI with DES 2005   Diverticulitis    Essential hypertension    CONTROLLED ON MEDS   GERD (gastroesophageal reflux disease)    History of GI bleed     while on Plavix   History of viral meningitis    following GI bleed    History of: ST elevation myocardial infarction (STEMI) of inferior wall, subsequent episode of care April 25, 2012;   RCA: 95% ISR followed by 100% stenosis post-stent with significant RPL lesions; LAD: Tandem 80 and 70% lesions involving D1 (1, 1, 1) --> initial angioplasty to RCA aspiration thrombectomy --> urgent CABG;   Hyperlipidemia LDL goal <70    Non-recurrent unilateral inguinal hernia without obstruction or gangrene    Obesity (BMI 30.0-34.9)    Right inguinal hernia 07/02/2016   S/P CABG x 4: LIMA-LAD, seq SVG-PD-PLA; SVG-diag. 04/28/2012   S/P coronary artery stent  placement Nov-Dec 2014   a) Inf STEMIL 11/28/'14 --> extensive (full metal Jacket) of SVG-RPDA Promus DES - 3 mm x 38 mm, 3 mm x 38 mm, 3 mm x 20 mm, 2.75 mm x 38 mm -- all postdilated to ~3.3 mm;; b) 12/1/'14: staged PCI to distal SVG-diagonal: Xience Xpedition 2.5 mm x 20 mm (2.66 mm)   ST elevation myocardial infarction (STEMI) of inferior wall, initial episode of care - s/p PTCA of 95% ISR to 70% and 100% distal stent occlusion to ~40% (8/25) 04/25/2012   ST elevation myocardial infarction (STEMI) of inferior wall, subsequent episode of care [facility])  Jul 29, 2013   Diffuse near occlusion of SVG-rPDA, 90%  SVG-Diag   STEMI 07/29/13- successful complex full metal jacket stenting of the SVG-PDA/PLA -07/29/2013, staged SVG-Dx PCI 12/01 07/29/2013   Supraventricular tachycardia     Past Surgical History:  Procedure Laterality Date   CARDIAC CATHETERIZATION  04/28/12   RCA: 95% ISR followed by 100% stenosis post-stent with significant RPL lesions; LAD: Tandem 80 and 70% lesions involving D1 (1, 1, 1) --> initial angioplasty to RCA aspiration thrombectomy --> urgent CABG;   CORONARY ARTERY BYPASS GRAFT  04/28/2012   Procedure: CORONARY ARTERY BYPASS GRAFTING (CABG);  Surgeon: Gaye Pollack, MD;  Location: [place];  Service: Open Heart Surgery;  Laterality: N/A;   HERNIA REPAIR Left    INGUINAL HERNIA REPAIR Right 08/06/2016   Procedure: HERNIA REPAIR INGUINAL ADULT;  Surgeon: Clayburn Pert, MD;  Location: [place];  Service: General;  Laterality: Right;   LEFT AND RIGHT HEART CATHETERIZATION WITH CORONARY ANGIOGRAM N/A 04/25/2012   Procedure: LEFT AND RIGHT HEART CATHETERIZATION WITH CORONARY ANGIOGRAM;  Surgeon: Leonie Man, MD;  Location: [facility] CATH LAB;  Service: Cardiovascular;  Laterality: N/A;   LEFT HEART CATHETERIZATION WITH CORONARY/GRAFT ANGIOGRAM  07/29/2013   Procedure: LEFT HEART CATHETERIZATION WITH Beatrix Fetters;  Surgeon: Blane Ohara, MD;  Location: [facility] CATH LAB;  Service: Cardiovascular; ; 95% stenosis with percent mid occlusion of SVG-RPDA; 90% anastomotic SVG-diagonal, moderate 50% disease in the native circumflex with patent OM 1 OM 2. 80-90% proximal LAD with patent LIMA to LAD , EF 50%   MOHS SURGERY     FOREHEAD   NM MYOVIEW LTD  Jan 2015   Moderate Inferior infarct (as expected), but no ischemia & EF ~45-50%   PERCUTANEOUS CORONARY STENT INTERVENTION (PCI-S)  07/29/2013   Procedure: PERCUTANEOUS CORONARY STENT INTERVENTION (PCI-S);  Surgeon: Blane Ohara, MD;  Location: [facility] CATH LAB;  Service: Cardiovascular;; PCI to SVG-RPDA: Promus DES - 3 mm x 38  mm, 3 mm x 38 mm, 3 mm x 20 mm, 2.75 mm x 38 mm -- all postdilated to ~3.3 mm;   PERCUTANEOUS CORONARY STENT INTERVENTION (PCI-S) N/A 08/01/2013   Procedure: PERCUTANEOUS CORONARY STENT INTERVENTION (PCI-S);  Surgeon: Leonie Man, MD;  Location: [facility] CATH LAB;  Service: Cardiovascular;staged PCI to distal SVG-diagonal: Xience Xpedition 2.5 mm x 20 mm (2.66 mm)     TRANSTHORACIC ECHOCARDIOGRAM  12/'13; 07/30/2013   a) Post CABG: EF 50-55%, moderate HK of anteroseptal wall, septal dyskinesis/dyssynergy due to poststernotomy;  grade 1 diastolic dysfunction. Mildly dilated LA, mildly reduced RV function-likely due to septal dyssynergy.; b) 11/'14 Post Inferior STEMI: EF 50-55% mild LVH. Mild hypokinesis of the distal lateral, and basal to mid inferior lateral and inferior walls -- consistent with RCA infarct     Current Outpatient Medications  Medication Sig Dispense Refill   acetaminophen (TYLENOL)  325 MG tablet Take 2 tablets (650 mg total) by mouth every 4 (four) hours as needed for headache or mild pain.     Ascorbic Acid (VITAMIN C) 1000 MG tablet Take 1,000 mg by mouth daily.     aspirin 81 MG tablet Take 81 mg by mouth daily.     carvedilol (COREG) 6.25 MG tablet Take 1 tablet (6.25 mg total) by mouth 2 (two) times daily. 180 tablet 3   cetirizine (ZYRTEC) 10 MG tablet Take 10 mg by mouth daily as needed for allergies. PM     clopidogrel (PLAVIX) 75 MG tablet TAKE 1 TABLET (75 MG TOTAL) BY MOUTH DAILY WITH BREAKFAST. 90 tablet 0   ezetimibe (ZETIA) 10 MG tablet Take 1 tablet (10 mg total) by mouth daily. 90 tablet 3   hydrocortisone 2.5 % cream Apply topically 2 (two) times daily as needed (Rash). Use up to 2 weeks 30 g 11   ketoconazole (NIZORAL) 2 % cream Apply 1 Application topically 2 (two) times daily. 60 g 11   pantoprazole (PROTONIX) 40 MG tablet Take 1 tablet (40 mg total) by mouth daily at 6 (six) AM. 90 tablet 3   sildenafil (VIAGRA) 100 MG tablet Take by mouth.     traZODone  (DESYREL) 50 MG tablet Take 50 mg by mouth at bedtime.     valsartan (DIOVAN) 320 MG tablet TAKE 1 TABLET BY MOUTH EVERY DAY 90 tablet 2   atorvastatin (LIPITOR) 20 MG tablet Take 1 tablet (20 mg total) by mouth daily. 90 tablet 3   NITROSTAT 0.4 MG SL tablet PLACE 1 TABLET UNDER THE TONGUE EVERY 5 MINUTES X 3 DOSES AS NEEDED FOR CHEST PAIN. 25 tablet 0   No current facility-administered medications for this visit.    Allergies:   Norvasc [amlodipine besylate], Ace inhibitors, and Crestor [rosuvastatin]    Social History:  The patient  reports that he quit smoking about 46 years ago. His smoking use included cigarettes. He has a 10.00 pack-year smoking history. He has never used smokeless tobacco. He reports current alcohol use of about 2.0 standard drinks of alcohol per week. He reports that he does not use drugs.   Family History:  The patient's family history includes Alzheimer's disease in his mother; Coronary artery disease in his father; Heart failure in his father; Valvular heart disease in his sister.    ROS:  Please see the history of present illness.   Otherwise, review of systems are positive for none.   All other systems are reviewed and negative.    PHYSICAL EXAM: VS:  BP (!) 148/88 (BP Location: Left Arm, Patient Position: Sitting)   Pulse 62   Ht 5\' 9"  (1.753 m)   Wt 225 lb 8 oz (102.3 kg)   SpO2 98%   BMI 33.30 kg/m  , BMI Body mass index is 33.3 kg/m. GEN: Well nourished, well developed, in no acute distress  HEENT: normal  Neck: no JVD,  or masses. Faint bilateral carotid bruits Cardiac: RRR; no murmurs, rubs, or gallops,no edema  Respiratory:  clear to auscultation bilaterally, normal work of breathing GI: soft, nontender, nondistended, + BS MS: no deformity or atrophy  Skin: warm and dry, no rash Neuro:  Strength and sensation are intact Psych: euthymic mood, full affect   EKG:  EKG is ordered today. The ekg ordered today demonstrates normal sinus rhythm  with a heart rate of 62 bpm.  Nonspecific T wave changes.  Recent Labs: 11/06/2022: ALT 31; BUN  11; Creatinine, Ser 1.02; Hemoglobin 14.2; Platelets 255; Potassium 4.6; Sodium 137    Lipid Panel    Component Value Date/Time   CHOL 149 11/06/2022 1127   CHOL 153 09/07/2012 1201   TRIG 169 (H) 11/06/2022 1127   TRIG 185 09/07/2012 1201   HDL 46 11/06/2022 1127   HDL 31 (L) 09/07/2012 1201   CHOLHDL 3.2 11/06/2022 1127   CHOLHDL 2.9 03/12/2020 0920   VLDL 20 03/12/2020 0920   VLDL 37 09/07/2012 1201   LDLCALC 74 11/06/2022 1127   LDLCALC 85 09/07/2012 1201      Wt Readings from Last 3 Encounters:  11/18/22 225 lb 8 oz (102.3 kg)  11/06/22 222 lb (100.7 kg)  10/07/22 223 lb 2 oz (101.2 kg)          No data to display            ASSESSMENT AND PLAN:  1.  Palpitations: These were thought to be due to short runs of SVT as well as PACs.  His symptoms are reasonably controlled with carvedilol.  I wonder if some of his dizziness might be related to the recent switch of metoprolol to carvedilol.  Will continue to monitor his symptoms for now.  2. Coronary artery disease involving bypass graft with other forms of angina: He has symptoms that are suggestive of class II angina.  I requested a treadmill nuclear stress test.  3. Hyperlipidemia: He did not tolerate with high-dose rosuvastatin in the past but has been doing well with low-dose atorvastatin and ezetimibe.  Recent lipid profile showed an LDL of 74.  I elected to increase atorvastatin to 20 mg daily.  4. Essential hypertension: His blood pressure is still mildly elevated.  Continue carvedilol and valsartan.  If blood pressure remains elevated, small dose amlodipine can be added.     Disposition:   FU with me in 6 months  Signed,  Kathlyn Sacramento, MD  11/20/2022 7:45 AM    [place]

## 2022-11-18 NOTE — Patient Instructions (Signed)
Medication Instructions:  INCREASE the Atorvastatin to 20 mg once daily  *If you need a refill on your cardiac medications before your next appointment, please call your pharmacy*   Lab Work: None ordered If you have labs (blood work) drawn today and your tests are completely normal, you will receive your results only by: [place] (if you have MyChart) OR A paper copy in the mail If you have any lab test that is abnormal or we need to change your treatment, we will call you to review the results.   Testing/Procedures: Your provider has ordered a Exercise Myoview Stress test. This will take place at [facility]. Please report to the [facility] medical mall entrance. The volunteers at the first desk will direct you where to go.  [place]  Your provider has ordered a Stress Test with nuclear imaging. The purpose of this test is to evaluate the blood supply to your heart muscle. This procedure is referred to as a "Non-Invasive Stress Test." This is because other than having an IV started in your vein, nothing is inserted or "invades" your body. Cardiac stress tests are done to find areas of poor blood flow to the heart by determining the extent of coronary artery disease (CAD). Some patients exercise on a treadmill, which naturally increases the blood flow to your heart, while others who are unable to walk on a treadmill due to physical limitations will have a pharmacologic/chemical stress agent called Lexiscan . This medicine will mimic walking on a treadmill by temporarily increasing your coronary blood flow.   Please note: these test may take anywhere between 2-4 hours to complete  How to prepare for your Myoview test:  Nothing to eat for 6 hours prior to the test No caffeine for 24 hours prior to test No smoking 24 hours prior to test. Your medication may be taken with water.  If your doctor stopped a medication because of this test, do not take that medication-hold the Carvedilol the  morning of the test Ladies, please do not wear dresses.  Skirts or pants are appropriate. Please wear a short sleeve shirt. No perfume, cologne or lotion. Wear comfortable walking shoes. No heels!   PLEASE NOTIFY THE OFFICE AT LEAST 34 HOURS IN ADVANCE IF YOU ARE UNABLE TO KEEP YOUR APPOINTMENT.  [phone] AND  PLEASE NOTIFY NUCLEAR MEDICINE AT [facility] AT LEAST 24 HOURS IN ADVANCE IF YOU ARE UNABLE TO KEEP YOUR APPOINTMENT. [phone]    Follow-Up: At [facility], you and your health needs are our priority.  As part of our continuing mission to provide you with exceptional heart care, we have created designated Provider Care Teams.  These Care Teams include your primary Cardiologist (physician) and Advanced Practice Providers (APPs -  Physician Assistants and Nurse Practitioners) who all work together to provide you with the care you need, when you need it.  We recommend signing up for the patient portal called "MyChart".  Sign up information is provided on this After Visit Summary.  MyChart is used to connect with patients for Virtual Visits (Telemedicine).  Patients are able to view lab/test results, encounter notes, upcoming appointments, etc.  Non-urgent messages can be sent to your provider as well.   To learn more about what you can do with MyChart, go to [email or web address].    Your next appointment:   6 month(s)  Provider:   You may see Kathlyn Sacramento, MD or one of the following Advanced Practice Providers on your designated Care Team:  Murray Hodgkins, NP Christell Faith, PA-C Cadence Kathlen Mody, PA-C Gerrie Nordmann, NP

## 2022-11-21 ENCOUNTER — Encounter
Admission: RE | Admit: 2022-11-21 | Discharge: 2022-11-21 | Disposition: A | Discharge status: Home / Self Care | Payer: Medicare HMO | Source: Ambulatory Visit | Attending: Cardiovascular Disease | Admitting: Cardiovascular Disease

## 2022-11-21 DIAGNOSIS — R072 Precordial pain: Secondary | ICD-10-CM

## 2022-11-21 MED ORDER — TECHNETIUM TC 99M TETROFOSMIN IV KIT
10.4400 | PACK | Freq: Once | INTRAVENOUS | Status: AC | PRN
  Administered 2022-11-21: 10.44 via INTRAVENOUS

## 2022-11-21 MED ORDER — TECHNETIUM TC 99M TETROFOSMIN IV KIT
32.1000 | PACK | Freq: Once | INTRAVENOUS | Status: AC | PRN
  Administered 2022-11-21: 32.1 via INTRAVENOUS

## 2022-11-22 LAB — NM MYOCAR MULTI W/SPECT W/WALL MOTION / EF
Angina Index: 1
Estimated workload: 7
Exercise duration (min): 6 min
Exercise duration (sec): 0 s
LV dias vol: 118 mL (ref 62–150)
LV sys vol: 34 mL
MPHR: 145 {beats}/min
Nuc Stress EF: 71 %
Peak HR: 133 {beats}/min
Percent HR: 91 %
Rest HR: 58 {beats}/min
Rest Nuclear Isotope Dose: 10.4 mCi
SDS: 8
SRS: 2
SSS: 8
Stress Nuclear Isotope Dose: 32.1 mCi
TID: 0.95

## 2022-11-24 ENCOUNTER — Telehealth: Payer: Self-pay | Admitting: *Deleted

## 2022-11-24 NOTE — Telephone Encounter (Signed)
Patient made aware of the need for a cardiac cath and is agreeable. He will discuss this with his wife and call back with a date.

## 2022-11-24 NOTE — Telephone Encounter (Signed)
Cath instructions given and sent to MyChart as well.    [facility] [facility], [place] [address] Dept: [phone] Loc: [phone]  Jonathan Escobar  11/24/2022  You are scheduled for a Cardiac Catheterization on Tuesday, March 26 with Dr. Kathlyn Sacramento.  1. Please arrive at the [facility], [place], [place] at 6:30 am (This is 1 hour prior to your procedure time).  Proceed to the Check-In Desk directly inside the entrance.  Procedure Parking: Use the entrance off of the [place] side of the hospital. Turn right upon entering and follow the driveway to parking that is directly in front of the [place]. There is no valet parking available at this entrance, however there is an awning directly in front of the [place] for drop off/ pick up for patients.  Special note: Every effort is made to have your procedure done on time. Please understand that emergencies sometimes delay scheduled procedures.  2. Diet: Do not eat solid foods after midnight.  The patient may have clear liquids until 5am upon the day of the procedure.  3. Labs: You will need to have blood drawn: completed on 11/06/22  4. Medication instructions in preparation for your procedure: Nothing to hold  On the morning of your procedure, take your Aspirin 81 mg and Plavix/Clopidogrel and any morning medicines NOT listed above.  You may use sips of water.  5. Plan for one night stay--bring personal belongings. 6. Bring a current list of your medications and current insurance cards. 7. You MUST have a responsible person to drive you home. 8. Someone MUST be with you the first 24 hours after you arrive home or your discharge will be delayed. 9. Please wear clothes that are easy to get on and off and wear slip-on  shoes.  Thank you for allowing [place] to care for you!   -- [place] [facility]

## 2022-11-24 NOTE — Telephone Encounter (Signed)
-----   Message from Wellington Hampshire, MD sent at 11/24/2022  8:05 AM EDT ----- Inform patient that  stress test was abnormal with evidence of ischemia in the inferior lateral region.  Given his times and abnormal stress test, recommend proceeding with left heart catheterization with coronary and graft angiography and possible percutaneous coronary intervention. I can do the case tomorrow at 730 or Thursday the 28th at 12:30 PM or Monday, April 1.

## 2022-11-25 ENCOUNTER — Encounter: Payer: Self-pay | Admitting: Cardiovascular Disease

## 2022-11-25 ENCOUNTER — Ambulatory Visit
Admission: RE | Admit: 2022-11-25 | Discharge: 2022-11-25 | Disposition: A | Discharge status: Home / Self Care | Payer: Medicare HMO | Attending: Cardiovascular Disease | Admitting: Cardiovascular Disease

## 2022-11-25 ENCOUNTER — Encounter
Admission: RE | Disposition: A | Discharge status: Home / Self Care | Payer: Self-pay | Source: Home / Self Care | Attending: Cardiovascular Disease

## 2022-11-25 ENCOUNTER — Other Ambulatory Visit: Payer: Self-pay

## 2022-11-25 DIAGNOSIS — Z87891 Personal history of nicotine dependence: Secondary | ICD-10-CM | POA: Diagnosis not present

## 2022-11-25 DIAGNOSIS — I2582 Chronic total occlusion of coronary artery: Secondary | ICD-10-CM | POA: Diagnosis not present

## 2022-11-25 DIAGNOSIS — E785 Hyperlipidemia, unspecified: Secondary | ICD-10-CM | POA: Diagnosis not present

## 2022-11-25 DIAGNOSIS — I252 Old myocardial infarction: Secondary | ICD-10-CM | POA: Diagnosis not present

## 2022-11-25 DIAGNOSIS — I2510 Atherosclerotic heart disease of native coronary artery without angina pectoris: Secondary | ICD-10-CM | POA: Diagnosis not present

## 2022-11-25 DIAGNOSIS — Z79899 Other long term (current) drug therapy: Secondary | ICD-10-CM | POA: Diagnosis not present

## 2022-11-25 DIAGNOSIS — I10 Essential (primary) hypertension: Secondary | ICD-10-CM | POA: Diagnosis not present

## 2022-11-25 DIAGNOSIS — I25110 Atherosclerotic heart disease of native coronary artery with unstable angina pectoris: Secondary | ICD-10-CM | POA: Diagnosis present

## 2022-11-25 DIAGNOSIS — I25708 Atherosclerosis of coronary artery bypass graft(s), unspecified, with other forms of angina pectoris: Secondary | ICD-10-CM | POA: Insufficient documentation

## 2022-11-25 DIAGNOSIS — R9439 Abnormal result of other cardiovascular function study: Secondary | ICD-10-CM | POA: Diagnosis not present

## 2022-11-25 DIAGNOSIS — Z955 Presence of coronary angioplasty implant and graft: Secondary | ICD-10-CM | POA: Insufficient documentation

## 2022-11-25 HISTORY — PX: LEFT HEART CATH AND CORONARY ANGIOGRAPHY: CATH118249

## 2022-11-25 SURGERY — LEFT HEART CATH AND CORONARY ANGIOGRAPHY
Anesthesia: Moderate Sedation | Laterality: Left

## 2022-11-25 MED ORDER — HEPARIN SODIUM (PORCINE) 1000 UNIT/ML IJ SOLN
INTRAMUSCULAR | Status: AC
  Filled 2022-11-25: qty 10

## 2022-11-25 MED ORDER — SODIUM CHLORIDE 0.9 % IV SOLN: 250.0000 mL | INTRAVENOUS | Status: DC | PRN

## 2022-11-25 MED ORDER — SODIUM CHLORIDE 0.9 % WEIGHT BASED INFUSION: 1.0000 mL/kg/h | INTRAVENOUS | Status: DC

## 2022-11-25 MED ORDER — ONDANSETRON HCL 4 MG/2ML IJ SOLN: 4.0000 mg | Freq: Four times a day (QID) | INTRAMUSCULAR | Status: DC | PRN

## 2022-11-25 MED ORDER — MIDAZOLAM HCL 2 MG/2ML IJ SOLN
INTRAMUSCULAR | Status: AC
  Filled 2022-11-25: qty 2

## 2022-11-25 MED ORDER — SODIUM CHLORIDE 0.9 % IV SOLN: INTRAVENOUS | Status: DC

## 2022-11-25 MED ORDER — ASPIRIN 81 MG PO CHEW: 81.0000 mg | CHEWABLE_TABLET | ORAL | Status: DC

## 2022-11-25 MED ORDER — HEPARIN (PORCINE) IN NACL 1000-0.9 UT/500ML-% IV SOLN
INTRAVENOUS | Status: AC
  Filled 2022-11-25: qty 1000

## 2022-11-25 MED ORDER — SODIUM CHLORIDE 0.9% FLUSH: 3.0000 mL | Freq: Two times a day (BID) | INTRAVENOUS | Status: DC

## 2022-11-25 MED ORDER — FENTANYL CITRATE (PF) 100 MCG/2ML IJ SOLN
INTRAMUSCULAR | Status: DC | PRN
  Administered 2022-11-25: 50 ug via INTRAVENOUS

## 2022-11-25 MED ORDER — FENTANYL CITRATE (PF) 100 MCG/2ML IJ SOLN
INTRAMUSCULAR | Status: AC
  Filled 2022-11-25: qty 2

## 2022-11-25 MED ORDER — SODIUM CHLORIDE 0.9% FLUSH: 3.0000 mL | INTRAVENOUS | Status: DC | PRN

## 2022-11-25 MED ORDER — IOHEXOL 300 MG/ML  SOLN
INTRAMUSCULAR | Status: DC | PRN
  Administered 2022-11-25: 87 mL

## 2022-11-25 MED ORDER — HEPARIN (PORCINE) IN NACL 2000-0.9 UNIT/L-% IV SOLN
INTRAVENOUS | Status: DC | PRN
  Administered 2022-11-25: 1000 mL

## 2022-11-25 MED ORDER — ACETAMINOPHEN 325 MG PO TABS: 650.0000 mg | ORAL_TABLET | ORAL | Status: DC | PRN

## 2022-11-25 MED ORDER — RANOLAZINE ER 500 MG PO TB12: 500.0000 mg | ORAL_TABLET | Freq: Two times a day (BID) | ORAL | 6 refills | Status: DC

## 2022-11-25 MED ORDER — SODIUM CHLORIDE 0.9 % WEIGHT BASED INFUSION
3.0000 mL/kg/h | INTRAVENOUS | Status: AC
  Administered 2022-11-25: 3 mL/kg/h via INTRAVENOUS

## 2022-11-25 MED ORDER — MIDAZOLAM HCL 2 MG/2ML IJ SOLN
INTRAMUSCULAR | Status: DC | PRN
  Administered 2022-11-25: 1 mg via INTRAVENOUS

## 2022-11-25 MED ORDER — VERAPAMIL HCL 2.5 MG/ML IV SOLN
INTRAVENOUS | Status: AC
  Filled 2022-11-25: qty 2

## 2022-11-25 SURGICAL SUPPLY — 17 items
CANNULA 5F STIFF (CANNULA) IMPLANT
CATH INFINITI 5 FR IM (CATHETERS) IMPLANT
CATH INFINITI 5 FR MPA2 (CATHETERS) IMPLANT
CATH INFINITI 5FR MULTPACK ANG (CATHETERS) IMPLANT
DEVICE CLOSURE MYNXGRIP 5F (Vascular Products) IMPLANT
DRAPE BRACHIAL (DRAPES) IMPLANT
GLIDESHEATH SLEND SS 6F .021 (SHEATH) IMPLANT
GUIDEWIRE INQWIRE 1.5J.035X260 (WIRE) IMPLANT
INQWIRE 1.5J .035X260CM (WIRE)
KIT SYRINGE INJ CVI SPIKEX1 (MISCELLANEOUS) IMPLANT
PACK CARDIAC CATH (CUSTOM PROCEDURE TRAY) ×1 IMPLANT
PROTECTION STATION PRESSURIZED (MISCELLANEOUS) ×1
SET ATX-X65L (MISCELLANEOUS) IMPLANT
SHEATH AVANTI 5FR X 11CM (SHEATH) IMPLANT
STATION PROTECTION PRESSURIZED (MISCELLANEOUS) IMPLANT
WIRE GUIDERIGHT .035X150 (WIRE) IMPLANT
WIRE NITINOL .018 (WIRE) IMPLANT

## 2022-11-25 NOTE — Interval H&P Note (Signed)
Cath Lab Visit (complete for each Cath Lab visit)  Clinical Evaluation Leading to the Procedure:   ACS: No.  Non-ACS:    Anginal Classification: CCS III  Anti-ischemic medical therapy: Minimal Therapy (1 class of medications)  Non-Invasive Test Results: High-risk stress test findings: cardiac mortality >3%/year  Prior CABG: Previous CABG      History and Physical Interval Note:  11/25/2022 7:52 AM  Jonathan Escobar  has presented today for surgery, with the diagnosis of L Cath   Abnormal stress test.  The various methods of treatment have been discussed with the patient and family. After consideration of risks, benefits and other options for treatment, the patient has consented to  Procedure(s): LEFT HEART CATH AND CORONARY ANGIOGRAPHY (Left) as a surgical intervention.  The patient's history has been reviewed, patient examined, no change in status, stable for surgery.  I have reviewed the patient's chart and labs.  Questions were answered to the patient's satisfaction.     Kathlyn Sacramento

## 2022-11-26 ENCOUNTER — Encounter: Payer: Self-pay | Admitting: Cardiovascular Disease

## 2022-11-27 NOTE — Progress Notes (Signed)
Cardiology Office Note   Date:  11/28/2022   ID:  Jonathan Escobar, Jonathan Escobar [birth year], MRN [id number]  PCP:  Glean Hess, MD  Cardiologist:   Kathlyn Sacramento, MD   Chief Complaint  Patient presents with   Other    Post cath no complaints today' Pt hasn't started Ranexa. Meds reviewed verbally with pt.      History of Present Illness: Jonathan Escobar is a 77 y.o. male who presents for a follow up visit regarding coronary artery disease. He is a former CT surgical PA with extensive cardiac history:  Initial cardiac event was in 1999 with bare-metal stent to the proximal RCA -> had brachytherapy in 2000 and 2003 then redo PCI with a Taxus DES in 2005 for ISR. Inferior STEMI 04/25/2012 - 95% ISR followed by 5% stenosis post stent with lesions in the PL and PDA. Also noted 70% mid LAD; after aspiration thrombectomy, balloon angioplasty performed and he was sent for urgent CABG  (LIMA-LAD, SVG-PDA and PLA, SVG-Diag)  Inferior STEMI in November 2014 with occluded SVG-PDA --> extensive PCI (full metal jacket) of SVG followed by staged PCI of anastomotic SVG-diagonal lesion. No cardiac events since then.   He is known to have short runs of SVT and PACs.   He has known history of essential hypertension with poor tolerance to medications.  He tried small dose amlodipine and small dose hydrochlorothiazide but did not feel well with both medications.  He recently started having anginal symptoms.  He underwent a treadmill nuclear stress test and was able to exercise for 6 minutes.  He had 1 mm of ST depression on EKG.  Nuclear imaging showed moderate size ischemia in the basal to mid inferolateral region.  I proceeded with cardiac catheterization which showed patent LIMA to LAD, occluded SVG to diagonal patent SVG to right PDA/PL.  The SVG to the RCA is known to be a full metal jacket from the ostium all the way distally.  Minimal stenosis was noted in the mid to distal area but there was 40%  eccentric restenosis in the proximal stent.  There was severe disease in the right posterior AV groove and PL branches as well.  I recommended medical therapy and started Ranexa 500 mg twice daily.  He has been doing reasonably well overall with no chest pain since cardiac cath but he has not done much physical activities or exercise.   Past Medical History:  Diagnosis Date   CAD (coronary artery disease), autologous vein bypass graft Nov 2014   Inf STEMI - PCI to SVG-RPDA:    CAD in native artery - prior Inferior MI - RCA PCI; 04/2012 - Inferior STEMI - RCA occluded (unable to dilate lesion adequately) + LAD & D1 disease --> referred for CABG 04/07/2012   CAD S/P percutaneous coronary angioplasty 1999   BMS to prox RCA 1999; Brachytherapy ~2003, followed by DES  PCI (Taxus) for ISR.   Coronary stent restenosis due to progression of disease 2000; 2005   (While Still In [place]) Status post Brachytherapy to RCA ISR - 2000; Redo PCI with DES 2005   Diverticulitis    Essential hypertension    CONTROLLED ON MEDS   GERD (gastroesophageal reflux disease)    History of GI bleed     while on Plavix   History of viral meningitis    following GI bleed    History of: ST elevation myocardial infarction (STEMI) of inferior wall, subsequent episode of care April 25, 2012;   RCA: 95% ISR followed by 100% stenosis post-stent with significant RPL lesions; LAD: Tandem 80 and 70% lesions involving D1 (1, 1, 1) --> initial angioplasty to RCA aspiration thrombectomy --> urgent CABG;   Hyperlipidemia LDL goal <70    Non-recurrent unilateral inguinal hernia without obstruction or gangrene    Obesity (BMI 30.0-34.9)    Right inguinal hernia 07/02/2016   S/P CABG x 4: LIMA-LAD, seq SVG-PD-PLA; SVG-diag. 04/28/2012   S/P coronary artery stent placement Nov-Dec 2014   a) Inf STEMIL 11/28/'14 --> extensive (full metal Jacket) of SVG-RPDA Promus DES - 3 mm x 38 mm, 3 mm x 38 mm, 3 mm x 20 mm, 2.75 mm x 38 mm -- all  postdilated to ~3.3 mm;; b) 12/1/'14: staged PCI to distal SVG-diagonal: Xience Xpedition 2.5 mm x 20 mm (2.66 mm)   ST elevation myocardial infarction (STEMI) of inferior wall, initial episode of care - s/p PTCA of 95% ISR to 70% and 100% distal stent occlusion to ~40% (8/25) 04/25/2012   ST elevation myocardial infarction (STEMI) of inferior wall, subsequent episode of care [facility])  Jul 29, 2013   Diffuse near occlusion of SVG-rPDA, 90% SVG-Diag   STEMI 07/29/13- successful complex full metal jacket stenting of the SVG-PDA/PLA -07/29/2013, staged SVG-Dx PCI 12/01 07/29/2013   Supraventricular tachycardia     Past Surgical History:  Procedure Laterality Date   CARDIAC CATHETERIZATION  04/28/12   RCA: 95% ISR followed by 100% stenosis post-stent with significant RPL lesions; LAD: Tandem 80 and 70% lesions involving D1 (1, 1, 1) --> initial angioplasty to RCA aspiration thrombectomy --> urgent CABG;   CORONARY ARTERY BYPASS GRAFT  04/28/2012   Procedure: CORONARY ARTERY BYPASS GRAFTING (CABG);  Surgeon: Gaye Pollack, MD;  Location: [place];  Service: Open Heart Surgery;  Laterality: N/A;   HERNIA REPAIR Left    INGUINAL HERNIA REPAIR Right 08/06/2016   Procedure: HERNIA REPAIR INGUINAL ADULT;  Surgeon: Clayburn Pert, MD;  Location: [place];  Service: General;  Laterality: Right;   LEFT AND RIGHT HEART CATHETERIZATION WITH CORONARY ANGIOGRAM N/A 04/25/2012   Procedure: LEFT AND RIGHT HEART CATHETERIZATION WITH CORONARY ANGIOGRAM;  Surgeon: Leonie Man, MD;  [facility] C[facility] LAB;  Service: Cardiovascular;  Laterality: N/A;   LEFT HEART CATH AND CORONARY ANGIOGRAPHY Left 11/25/2022   Procedure: LEFT HEART CATH AND CORONARY ANGIOGRAPHY;  Surgeon: Wellington Hampshire, MD;  Location: [place] CV LAB;  Service: Cardiovascular;  Laterality: Left;   LEFT HEART CATHETERIZATION WITH CORONARY/GRAFT ANGIOGRAM  07/29/2013   Procedure: LEFT HEART CATHETERIZATION WITH Beatrix Fetters;   Surgeon: Blane Ohara, MD;  Location: [facility] CATH LAB;  Service: Cardiovascular; ; 95% stenosis with percent mid occlusion of SVG-RPDA; 90% anastomotic SVG-diagonal, moderate 50% disease in the native circumflex with patent OM 1 OM 2. 80-90% proximal LAD with patent LIMA to LAD , EF 50%   MOHS SURGERY     FOREHEAD   NM MYOVIEW LTD  Jan 2015   Moderate Inferior infarct (as expected), but no ischemia & EF ~45-50%   PERCUTANEOUS CORONARY STENT INTERVENTION (PCI-S)  07/29/2013   Procedure: PERCUTANEOUS CORONARY STENT INTERVENTION (PCI-S);  Surgeon: Blane Ohara, MD;  Location: [facility] CATH LAB;  Service: Cardiovascular;; PCI to SVG-RPDA: Promus DES - 3 mm x 38 mm, 3 mm x 38 mm, 3 mm x 20 mm, 2.75 mm x 38 mm -- all postdilated to ~3.3 mm;   PERCUTANEOUS CORONARY STENT INTERVENTION (PCI-S) N/A 08/01/2013  Procedure: PERCUTANEOUS CORONARY STENT INTERVENTION (PCI-S);  Surgeon: Leonie Man, MD;  Location: [facility] CATH LAB;  Service: Cardiovascular;staged PCI to distal SVG-diagonal: Xience Xpedition 2.5 mm x 20 mm (2.66 mm)     TRANSTHORACIC ECHOCARDIOGRAM  12/'13; 07/30/2013   a) Post CABG: EF 50-55%, moderate HK of anteroseptal wall, septal dyskinesis/dyssynergy due to poststernotomy;  grade 1 diastolic dysfunction. Mildly dilated LA, mildly reduced RV function-likely due to septal dyssynergy.; b) 11/'14 Post Inferior STEMI: EF 50-55% mild LVH. Mild hypokinesis of the distal lateral, and basal to mid inferior lateral and inferior walls -- consistent with RCA infarct     Current Outpatient Medications  Medication Sig Dispense Refill   acetaminophen (TYLENOL) 325 MG tablet Take 2 tablets (650 mg total) by mouth every 4 (four) hours as needed for headache or mild pain.     Ascorbic Acid (VITAMIN C) 1000 MG tablet Take 1,000 mg by mouth daily.     aspirin 81 MG tablet Take 81 mg by mouth daily.     atorvastatin (LIPITOR) 20 MG tablet Take 1 tablet (20 mg total) by mouth daily. 90 tablet 3   carvedilol (COREG)  6.25 MG tablet Take 1 tablet (6.25 mg total) by mouth 2 (two) times daily. 180 tablet 3   cetirizine (ZYRTEC) 10 MG tablet Take 10 mg by mouth daily as needed for allergies. PM     clopidogrel (PLAVIX) 75 MG tablet TAKE 1 TABLET (75 MG TOTAL) BY MOUTH DAILY WITH BREAKFAST. 90 tablet 0   ezetimibe (ZETIA) 10 MG tablet Take 1 tablet (10 mg total) by mouth daily. 90 tablet 3   hydrocortisone 2.5 % cream Apply topically 2 (two) times daily as needed (Rash). Use up to 2 weeks 30 g 11   ketoconazole (NIZORAL) 2 % cream Apply 1 Application topically 2 (two) times daily. 60 g 11   NITROSTAT 0.4 MG SL tablet PLACE 1 TABLET UNDER THE TONGUE EVERY 5 MINUTES X 3 DOSES AS NEEDED FOR CHEST PAIN. 25 tablet 0   pantoprazole (PROTONIX) 40 MG tablet Take 1 tablet (40 mg total) by mouth daily at 6 (six) AM. 90 tablet 3   sildenafil (VIAGRA) 100 MG tablet Take by mouth.     traZODone (DESYREL) 50 MG tablet Take 50 mg by mouth at bedtime.     valsartan (DIOVAN) 320 MG tablet TAKE 1 TABLET BY MOUTH EVERY DAY 90 tablet 2   ranolazine (RANEXA) 500 MG 12 hr tablet Take 1 tablet (500 mg total) by mouth 2 (two) times daily. (Patient not taking: Reported on 11/28/2022) 60 tablet 6   No current facility-administered medications for this visit.    Allergies:   Norvasc [amlodipine besylate], Ace inhibitors, and Crestor [rosuvastatin]    Social History:  The patient  reports that he quit smoking about 46 years ago. His smoking use included cigarettes. He has a 10.00 pack-year smoking history. He has never used smokeless tobacco. He reports current alcohol use of about 2.0 standard drinks of alcohol per week. He reports that he does not use drugs.   Family History:  The patient's family history includes Alzheimer's disease in his mother; Coronary artery disease in his father; Heart failure in his father; Valvular heart disease in his sister.    ROS:  Please see the history of present illness.   Otherwise, review of systems  are positive for none.   All other systems are reviewed and negative.    PHYSICAL EXAM: VS:  BP 130/88 (BP Location: Left  Arm, Patient Position: Sitting, Cuff Size: Large)   Pulse (!) 54   Ht 5\' 9"  (1.753 m)   Wt 223 lb (101.2 kg)   SpO2 98%   BMI 32.93 kg/m  , BMI Body mass index is 32.93 kg/m. GEN: Well nourished, well developed, in no acute distress  HEENT: normal  Neck: no JVD,  or masses. Faint bilateral carotid bruits Cardiac: RRR; no murmurs, rubs, or gallops,no edema  Respiratory:  clear to auscultation bilaterally, normal work of breathing GI: soft, nontender, nondistended, + BS MS: no deformity or atrophy  Skin: warm and dry, no rash Neuro:  Strength and sensation are intact Psych: euthymic mood, full affect Right groin is intact with no hematoma.  EKG:  EKG is ordered today. The ekg ordered today demonstrates sinus bradycardia with sinus arrhythmia with anterolateral T wave changes suggestive of ischemia.  Recent Labs: 11/06/2022: ALT 31; BUN 11; Creatinine, Ser 1.02; Hemoglobin 14.2; Platelets 255; Potassium 4.6; Sodium 137    Lipid Panel    Component Value Date/Time   CHOL 149 11/06/2022 1127   CHOL 153 09/07/2012 1201   TRIG 169 (H) 11/06/2022 1127   TRIG 185 09/07/2012 1201   HDL 46 11/06/2022 1127   HDL 31 (L) 09/07/2012 1201   CHOLHDL 3.2 11/06/2022 1127   CHOLHDL 2.9 03/12/2020 0920   VLDL 20 03/12/2020 0920   VLDL 37 09/07/2012 1201   LDLCALC 74 11/06/2022 1127   LDLCALC 85 09/07/2012 1201      Wt Readings from Last 3 Encounters:  11/28/22 223 lb (101.2 kg)  11/25/22 217 lb (98.4 kg)  11/18/22 225 lb 8 oz (102.3 kg)          No data to display            ASSESSMENT AND PLAN:  1.   Coronary artery disease involving bypass graft with stable class II angina: I reviewed the images of cardiac catheterization with him.  SVG to diagonal is now occluded.  SVG to RCA is patent with moderate in-stent restenosis proximally and small vessel  disease distally affecting the posterior AV groove and PL branches which likely accounts for the abnormal stress test.  The vessels are too small to stent.  The proximal in-stent restenosis can be treated with drug-coated balloon angioplasty if it gets worse in the future. Continue long-term dual antiplatelet therapy. He was started on Ranexa 500 mg twice daily to help with angina. I asked him to start an exercise program.  2. Palpitations: These were thought to be due to short runs of SVT as well as PACs.  His symptoms are reasonably controlled with carvedilol.  I   3. Hyperlipidemia: He did not tolerate with high-dose rosuvastatin in the past but has been doing well with low-dose atorvastatin and ezetimibe.  Recent LDL was 74 and thus atorvastatin was increased to 20 mg daily.  4. Essential hypertension: Blood pressure is reasonably controlled now.  If his blood pressure increases in the future, recommend switching valsartan to [facility] given that his ejection fraction was in the low normal range..   Disposition:   FU with me in 3 months  Signed,  Kathlyn Sacramento, MD  11/28/2022 9:47 AM    [place]

## 2022-11-28 ENCOUNTER — Encounter: Payer: Self-pay | Admitting: Cardiovascular Disease

## 2022-11-28 ENCOUNTER — Ambulatory Visit: Payer: Medicare HMO | Attending: Cardiovascular Disease | Admitting: Cardiovascular Disease

## 2022-11-28 VITALS — BP 130/88 | HR 54 | Ht 69.0 in | Wt 223.0 lb

## 2022-11-28 DIAGNOSIS — E785 Hyperlipidemia, unspecified: Secondary | ICD-10-CM | POA: Diagnosis not present

## 2022-11-28 DIAGNOSIS — I10 Essential (primary) hypertension: Secondary | ICD-10-CM | POA: Diagnosis not present

## 2022-11-28 DIAGNOSIS — R002 Palpitations: Secondary | ICD-10-CM | POA: Diagnosis not present

## 2022-11-28 DIAGNOSIS — I25718 Atherosclerosis of autologous vein coronary artery bypass graft(s) with other forms of angina pectoris: Secondary | ICD-10-CM

## 2022-11-28 NOTE — Patient Instructions (Signed)
Medication Instructions:  No changes *If you need a refill on your cardiac medications before your next appointment, please call your pharmacy*   Lab Work: None ordered If you have labs (blood work) drawn today and your tests are completely normal, you will receive your results only by: MyChart Message (if you have MyChart) OR A paper copy in the mail If you have any lab test that is abnormal or we need to change your treatment, we will call you to review the results.   Testing/Procedures: None ordered   Follow-Up: At [facility], you and your health needs are our priority.  As part of our continuing mission to provide you with exceptional heart care, we have created designated Provider Care Teams.  These Care Teams include your primary Cardiologist (physician) and Advanced Practice Providers (APPs -  Physician Assistants and Nurse Practitioners) who all work together to provide you with the care you need, when you need it.  We recommend signing up for the patient portal called "MyChart".  Sign up information is provided on this After Visit Summary.  MyChart is used to connect with patients for Virtual Visits (Telemedicine).  Patients are able to view lab/test results, encounter notes, upcoming appointments, etc.  Non-urgent messages can be sent to your provider as well.   To learn more about what you can do with MyChart, go to [email or web address].    Your next appointment:   3 month(s)  Provider:   You may see Muhammad Arida, MD or one of the following Advanced Practice Providers on your designated Care Team:   Christopher Berge, NP Ryan Dunn, PA-C Cadence Furth, PA-C Sheri Hammock, NP     

## 2023-01-12 ENCOUNTER — Encounter: Payer: Self-pay | Admitting: Internal Medicine

## 2023-01-12 ENCOUNTER — Ambulatory Visit (INDEPENDENT_AMBULATORY_CARE_PROVIDER_SITE_OTHER): Payer: Medicare HMO | Admitting: Internal Medicine

## 2023-01-12 VITALS — BP 122/78 | HR 55 | Ht 69.0 in | Wt 219.0 lb

## 2023-01-12 DIAGNOSIS — K5732 Diverticulitis of large intestine without perforation or abscess without bleeding: Secondary | ICD-10-CM | POA: Diagnosis not present

## 2023-01-12 MED ORDER — AMOXICILLIN-POT CLAVULANATE 875-125 MG PO TABS: 1.0000 | ORAL_TABLET | Freq: Two times a day (BID) | ORAL | 0 refills | Status: AC

## 2023-01-12 NOTE — Patient Instructions (Signed)
-  It was a pleasure to see you today! Please review your visit summary for helpful information. -Lab results are usually available within 1-2 days and we will call once reviewed. -I would encourage you to follow your care via MyChart where you can access lab results, notes, messages, and more. -If you feel that we did a nice job today, please complete your after-visit survey and leave us a Google review! Your CMA today was Laren Whaling and your provider was Dr Laura Berglund, MD.  

## 2023-01-12 NOTE — Progress Notes (Signed)
Date:  01/12/2023   Name:  Jonathan Escobar   DOB:  1945/11/23   MRN:  [id number]   Chief Complaint: Abdominal Pain (Started on Friday. Patient has hx of diverticulitis. Constipation. No blood in stool, fever, or nausea. Patient believes he is having a diverticulitis flare up, and needs abx.)  Abdominal Pain This is a new problem. The current episode started in the past 7 days. The onset quality is gradual. The problem occurs constantly. The problem has been unchanged. The pain is located in the LLQ. The pain is mild. The quality of the pain is cramping and aching. The abdominal pain does not radiate. Associated symptoms include constipation. Pertinent negatives include no fever, headaches, nausea, vomiting or weight loss.  Pain is very similar to previous diverticulitis episodes.  He took 2 augmentin doses on Saturday and feels somewhat better today.  He is still constipated and has not addressed this.  Lab Results  Component Value Date   NA 137 11/06/2022   K 4.6 11/06/2022   CO2 21 11/06/2022   GLUCOSE 100 (H) 11/06/2022   BUN 11 11/06/2022   CREATININE 1.02 11/06/2022   CALCIUM 9.7 11/06/2022   EGFR 77 11/06/2022   GFRNONAA 74 06/18/2020   Lab Results  Component Value Date   CHOL 149 11/06/2022   HDL 46 11/06/2022   LDLCALC 74 11/06/2022   TRIG 169 (H) 11/06/2022   CHOLHDL 3.2 11/06/2022   Lab Results  Component Value Date   TSH 1.300 06/18/2020   Lab Results  Component Value Date   HGBA1C 5.9 (H) 11/06/2022   Lab Results  Component Value Date   WBC 5.6 11/06/2022   HGB 14.2 11/06/2022   HCT 41.3 11/06/2022   MCV 94 11/06/2022   PLT 255 11/06/2022   Lab Results  Component Value Date   ALT 31 11/06/2022   AST 30 11/06/2022   ALKPHOS 87 11/06/2022   BILITOT 0.6 11/06/2022   No results found for: "25OHVITD2", "25OHVITD3", "VD25OH"   Review of Systems  Constitutional:  Negative for appetite change, chills, fatigue, fever and weight loss.  Respiratory:   Negative for shortness of breath.   Cardiovascular:  Negative for chest pain and palpitations.  Gastrointestinal:  Positive for abdominal pain and constipation. Negative for blood in stool, nausea and vomiting.  Neurological:  Negative for headaches.  Psychiatric/Behavioral:  Negative for dysphoric mood and sleep disturbance. The patient is not nervous/anxious.     Patient Active Problem List   Diagnosis Date Noted   Abnormal stress test 11/25/2022   Prediabetes 11/06/2022   Acquired thrombophilia (HCC) 11/06/2022   Tinnitus 08/12/2021   Trigger thumb, left thumb 08/12/2021   Benign essential microscopic hematuria 03/12/2020   Pain in left knee 08/23/2019   Atherosclerosis of aorta (HCC) 03/07/2019   GERD (gastroesophageal reflux disease) 01/19/2018   Personal history of other malignant neoplasm of skin 10/08/2015   Essential hypertension 07/19/2014   Fatigue 01/05/2014   Heart palpitations 09/10/2013   Obesity (BMI 30-39.9) 08/09/2013   CAD (coronary artery disease), autologous vein bypass graft --> full metal jacket PCI of SVG-RCA; PCI of SVG-D1 anastomosis 07/02/2013   Hyperlipidemia LDL goal <70    S/P CABG x 4, 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 (HCC) 04/07/2012    Allergies  Allergen Reactions   Norvasc [Amlodipine Besylate]  Dizzy   Ace Inhibitors Other (See Comments)    Cough    Crestor [Rosuvastatin] Other (See Comments)    Muscle pain    Past Surgical History:  Procedure Laterality Date   CARDIAC CATHETERIZATION  04/28/12   RCA: 95% ISR followed by 100% stenosis post-stent with significant RPL lesions; LAD: Tandem 80 and 70% lesions involving D1 (1, 1, 1) --> initial angioplasty to RCA aspiration thrombectomy --> urgent CABG;   CORONARY ARTERY BYPASS GRAFT  04/28/2012   Procedure: CORONARY ARTERY BYPASS GRAFTING  (CABG);  Surgeon: Alleen Borne, MD;  Location: [facility];  Service: Open Heart Surgery;  Laterality: N/A;   HERNIA REPAIR Left    INGUINAL HERNIA REPAIR Right 08/06/2016   Procedure: HERNIA REPAIR INGUINAL ADULT;  Surgeon: Ricarda Frame, MD;  Location: [facility];  Service: General;  Laterality: Right;   LEFT AND RIGHT HEART CATHETERIZATION WITH CORONARY ANGIOGRAM N/A 04/25/2012   Procedure: LEFT AND RIGHT HEART CATHETERIZATION WITH CORONARY ANGIOGRAM;  Surgeon: Marykay Lex, MD;  Location: [facility] CATH LAB;  Service: Cardiovascular;  Laterality: N/A;   LEFT HEART CATH AND CORONARY ANGIOGRAPHY Left 11/25/2022   Procedure: LEFT HEART CATH AND CORONARY ANGIOGRAPHY;  Surgeon: Iran Ouch, MD;  Location: [facility] INVASIVE CV LAB;  Service: Cardiovascular;  Laterality: Left;   LEFT HEART CATHETERIZATION WITH CORONARY/GRAFT ANGIOGRAM  07/29/2013   Procedure: LEFT HEART CATHETERIZATION WITH Isabel Caprice;  Surgeon: Micheline Chapman, MD;  Location: [facility] CATH LAB;  Service: Cardiovascular; ; 95% stenosis with percent mid occlusion of SVG-RPDA; 90% anastomotic SVG-diagonal, moderate 50% disease in the native circumflex with patent OM 1 OM 2. 80-90% proximal LAD with patent LIMA to LAD , EF 50%   MOHS SURGERY     FOREHEAD   NM MYOVIEW LTD  Jan 2015   Moderate Inferior infarct (as expected), but no ischemia & EF ~45-50%   PERCUTANEOUS CORONARY STENT INTERVENTION (PCI-S)  07/29/2013   Procedure: PERCUTANEOUS CORONARY STENT INTERVENTION (PCI-S);  Surgeon: Micheline Chapman, MD;  Location: [facility] CATH LAB;  Service: Cardiovascular;; PCI to SVG-RPDA: Promus DES - 3 mm x 38 mm, 3 mm x 38 mm, 3 mm x 20 mm, 2.75 mm x 38 mm -- all postdilated to ~3.3 mm;   PERCUTANEOUS CORONARY STENT INTERVENTION (PCI-S) N/A 08/01/2013   Procedure: PERCUTANEOUS CORONARY STENT INTERVENTION (PCI-S);  Surgeon: Marykay Lex, MD;  Location: [facility] CATH LAB;  Service: Cardiovascular;staged PCI to distal SVG-diagonal: Xience Xpedition  2.5 mm x 20 mm (2.66 mm)     TRANSTHORACIC ECHOCARDIOGRAM  12/'13; 07/30/2013   a) Post CABG: EF 50-55%, moderate HK of anteroseptal wall, septal dyskinesis/dyssynergy due to poststernotomy;  grade 1 diastolic dysfunction. Mildly dilated LA, mildly reduced RV function-likely due to septal dyssynergy.; b) 11/'14 Post Inferior STEMI: EF 50-55% mild LVH. Mild hypokinesis of the distal lateral, and basal to mid inferior lateral and inferior walls -- consistent with RCA infarct    Social History   Tobacco Use   Smoking status: Former    Packs/day: 1.00    Years: 10.00    Additional pack years: 0.00    Total pack years: 10.00    Types: Cigarettes    Quit date: 09/01/1976    Years since quitting: 46.3   Smokeless tobacco: Never  Vaping Use   Vaping Use: Never used  Substance Use Topics   Alcohol use: Yes    Alcohol/week: 2.0 standard drinks of alcohol    Types: 2 Shots of liquor per week  Comment: 1-2 shots of jack daniels a day.   Drug use: No     Medication list has been reviewed and updated.  Current Meds  Medication Sig   acetaminophen (TYLENOL) 325 MG tablet Take 2 tablets (650 mg total) by mouth every 4 (four) hours as needed for headache or mild pain.   amoxicillin-clavulanate (AUGMENTIN) 875-125 MG tablet Take 1 tablet by mouth 2 (two) times daily for 10 days.   Ascorbic Acid (VITAMIN C) 1000 MG tablet Take 1,000 mg by mouth daily.   aspirin 81 MG tablet Take 81 mg by mouth daily.   atorvastatin (LIPITOR) 20 MG tablet Take 1 tablet (20 mg total) by mouth daily.   carvedilol (COREG) 6.25 MG tablet Take 1 tablet (6.25 mg total) by mouth 2 (two) times daily.   cetirizine (ZYRTEC) 10 MG tablet Take 10 mg by mouth daily as needed for allergies. PM   clopidogrel (PLAVIX) 75 MG tablet TAKE 1 TABLET (75 MG TOTAL) BY MOUTH DAILY WITH BREAKFAST.   ezetimibe (ZETIA) 10 MG tablet Take 1 tablet (10 mg total) by mouth daily.   hydrocortisone 2.5 % cream Apply topically 2 (two) times daily  as needed (Rash). Use up to 2 weeks   ketoconazole (NIZORAL) 2 % cream Apply 1 Application topically 2 (two) times daily.   NITROSTAT 0.4 MG SL tablet PLACE 1 TABLET UNDER THE TONGUE EVERY 5 MINUTES X 3 DOSES AS NEEDED FOR CHEST PAIN.   pantoprazole (PROTONIX) 40 MG tablet Take 1 tablet (40 mg total) by mouth daily at 6 (six) AM.   ranolazine (RANEXA) 500 MG 12 hr tablet Take 500 mg by mouth 2 (two) times daily.   sildenafil (VIAGRA) 100 MG tablet Take by mouth.   traZODone (DESYREL) 50 MG tablet Take 25 mg by mouth at bedtime.   valsartan (DIOVAN) 320 MG tablet TAKE 1 TABLET BY MOUTH EVERY DAY       01/12/2023   10:22 AM 11/06/2022   10:58 AM 08/12/2021   10:43 AM 03/12/2020    8:06 AM  GAD 7 : Generalized Anxiety Score  Nervous, Anxious, on Edge 0 0 0 0  Control/stop worrying 0 0 0 0  Worry too much - different things 0 0 0 0  Trouble relaxing 0 0 0 0  Restless 0 0 0 0  Easily annoyed or irritable 0 0 0 0  Afraid - awful might happen 0 0 0 0  Total GAD 7 Score 0 0 0 0  Anxiety Difficulty Not difficult at all Not difficult at all Not difficult at all Not difficult at all       01/12/2023   10:21 AM 11/06/2022   10:57 AM 08/12/2021   10:42 AM  Depression screen PHQ 2/9  Decreased Interest 0 0 0  Down, Depressed, Hopeless 0 0 0  PHQ - 2 Score 0 0 0  Altered sleeping 1 0 0  Tired, decreased energy 1 0 0  Change in appetite 0 0 0  Feeling bad or failure about yourself  0 0 0  Trouble concentrating 0 0 0  Moving slowly or fidgety/restless 0 0 0  Suicidal thoughts 0 0 0  PHQ-9 Score 2 0 0  Difficult doing work/chores Not difficult at all Not difficult at all Not difficult at all    BP Readings from Last 3 Encounters:  01/12/23 122/78  11/28/22 130/88  11/25/22 129/86    Physical Exam Vitals and nursing note reviewed.  Constitutional:  General: He is not in acute distress.    Appearance: He is well-developed.  HENT:     Head: Normocephalic and atraumatic.   Pulmonary:     Effort: Pulmonary effort is normal. No respiratory distress.  Abdominal:     General: Abdomen is flat. Bowel sounds are increased.     Palpations: Abdomen is soft.     Tenderness: There is abdominal tenderness in the left lower quadrant. There is no right CVA tenderness, left CVA tenderness, guarding or rebound.  Skin:    General: Skin is warm and dry.     Findings: No rash.  Neurological:     Mental Status: He is alert and oriented to person, place, and time.  Psychiatric:        Mood and Affect: Mood normal.        Behavior: Behavior normal.     Wt Readings from Last 3 Encounters:  01/12/23 219 lb (99.3 kg)  11/28/22 223 lb (101.2 kg)  11/25/22 217 lb (98.4 kg)    BP 122/78 (BP Location: Right Arm, Cuff Size: Large)   Pulse (!) 55   Ht 5\' 9"  (1.753 m)   Wt 219 lb (99.3 kg)   SpO2 98%   BMI 32.34 kg/m   Assessment and Plan:  Problem List Items Addressed This Visit   None Visit Diagnoses     Diverticulitis large intestine w/o perforation or abscess w/o bleeding    -  Primary   Recommend bland diet with sufficient fluid intake colace or Miralax for constipation follow up if symptoms worsen   Relevant Medications   amoxicillin-clavulanate (AUGMENTIN) 875-125 MG tablet       No follow-ups on file.   Partially dictated using Dragon software, any errors are not intentional.  Reubin Milan, MD [facility] [place], [place]

## 2023-02-25 ENCOUNTER — Ambulatory Visit (INDEPENDENT_AMBULATORY_CARE_PROVIDER_SITE_OTHER): Payer: Medicare HMO

## 2023-02-25 VITALS — Ht 69.0 in | Wt 216.0 lb

## 2023-02-25 DIAGNOSIS — Z0000 Encounter for general adult medical examination without abnormal findings: Secondary | ICD-10-CM

## 2023-02-25 NOTE — Patient Instructions (Signed)
[name] , Thank you for taking time to come for your Medicare Wellness Visit. I appreciate your ongoing commitment to your health goals. Please review the following plan we discussed and let me know if I can assist you in the future.   These are the goals we discussed:  Goals       Exercise 3x per week (30 min per time) (pt-stated)      " Lose weight"        This is a list of the screening recommended for you and due dates:  Health Maintenance  Topic Date Due   Zoster (Shingles) Vaccine (1 of 2) Never done   COVID-19 Vaccine (3 - Pfizer risk series) 10/11/2019   Flu Shot  04/02/2023   Colon Cancer Screening  10/26/2023   DTaP/Tdap/Td vaccine (4 - Td or Tdap) 12/30/2024   Pneumonia Vaccine  Completed   Hepatitis C Screening  Completed   HPV Vaccine  Aged Out   Health Maintenance After Age 78 After age 42, you are at a higher risk for certain long-term diseases and infections as well as injuries from falls. Falls are a major cause of broken bones and head injuries in people who are older than age 73. Getting regular preventive care can help to keep you healthy and well. Preventive care includes getting regular testing and making lifestyle changes as recommended by your health care provider. Talk with your health care provider about: Which screenings and tests you should have. A screening is a test that checks for a disease when you have no symptoms. A diet and exercise plan that is right for you. What should I know about screenings and tests to prevent falls? Screening and testing are the best ways to find a health problem early. Early diagnosis and treatment give you the best chance of managing medical conditions that are common after age 18. Certain conditions and lifestyle choices may make you more likely to have a fall. Your health care provider may recommend: Regular vision checks. Poor vision and conditions such as cataracts can make you more likely to have a fall. If you wear  glasses, make sure to get your prescription updated if your vision changes. Medicine review. Work with your health care provider to regularly review all of the medicines you are taking, including over-the-counter medicines. Ask your health care provider about any side effects that may make you more likely to have a fall. Tell your health care provider if any medicines that you take make you feel dizzy or sleepy. Strength and balance checks. Your health care provider may recommend certain tests to check your strength and balance while standing, walking, or changing positions. Foot health exam. Foot pain and numbness, as well as not wearing proper footwear, can make you more likely to have a fall. Screenings, including: Osteoporosis screening. Osteoporosis is a condition that causes the bones to get weaker and break more easily. Blood pressure screening. Blood pressure changes and medicines to control blood pressure can make you feel dizzy. Depression screening. You may be more likely to have a fall if you have a fear of falling, feel depressed, or feel unable to do activities that you used to do. Alcohol use screening. Using too much alcohol can affect your balance and may make you more likely to have a fall. Follow these instructions at home: Lifestyle Do not drink alcohol if: Your health care provider tells you not to drink. If you drink alcohol: Limit how much you have  to: 0-1 drink a day for women. 0-2 drinks a day for men. Know how much alcohol is in your drink. In the [place] one drink equals one 12 oz bottle of beer (355 mL), one 5 oz glass of wine (148 mL), or one 1 oz glass of hard liquor (44 mL). Do not use any products that contain nicotine or tobacco. These products include cigarettes, chewing tobacco, and vaping devices, such as e-cigarettes. If you need help quitting, ask your health care provider. Activity  Follow a regular exercise program to stay fit. This will help you maintain  your balance. Ask your health care provider what types of exercise are appropriate for you. If you need a cane or walker, use it as recommended by your health care provider. Wear supportive shoes that have nonskid soles. Safety  Remove any tripping hazards, such as rugs, cords, and clutter. Install safety equipment such as grab bars in bathrooms and safety rails on stairs. Keep rooms and walkways well-lit. General instructions Talk with your health care provider about your risks for falling. Tell your health care provider if: You fall. Be sure to tell your health care provider about all falls, even ones that seem minor. You feel dizzy, tiredness (fatigue), or off-balance. Take over-the-counter and prescription medicines only as told by your health care provider. These include supplements. Eat a healthy diet and maintain a healthy weight. A healthy diet includes low-fat dairy products, low-fat (lean) meats, and fiber from whole grains, beans, and lots of fruits and vegetables. Stay current with your vaccines. Schedule regular health, dental, and eye exams. Summary Having a healthy lifestyle and getting preventive care can help to protect your health and wellness after age 82. Screening and testing are the best way to find a health problem early and help you avoid having a fall. Early diagnosis and treatment give you the best chance for managing medical conditions that are more common for people who are older than age 66. Falls are a major cause of broken bones and head injuries in people who are older than age 6. Take precautions to prevent a fall at home. Work with your health care provider to learn what changes you can make to improve your health and wellness and to prevent falls. This information is not intended to replace advice given to you by your health care provider. Make sure you discuss any questions you have with your health care provider. Document Revised: 01/07/2021 Document  Reviewed: 01/07/2021 Elsevier Patient Education  2024 [employer].

## 2023-02-25 NOTE — Progress Notes (Signed)
Subjective:   Jonathan Escobar is a 77 y.o. male who presents for Medicare Annual/Subsequent preventive examination.  Visit Complete: Virtual  I connected with  Jonathan Escobar on 02/25/23 by a audio enabled telemedicine application and verified that I am speaking with the correct person using two identifiers.  Patient Location: Home  Provider Location: Office/Clinic  I discussed the limitations of evaluation and management by telemedicine. The patient expressed understanding and agreed to proceed.     Cardiac Risk Factors include: advanced age (>6men, >21 women);dyslipidemia;hypertension;obesity (BMI >30kg/m2);male gender     Objective:    Today's Vitals   02/25/23 1047  Weight: 216 lb (98 kg)  Height: 5\' 9"  (1.753 m)   Body mass index is 31.9 kg/m.     02/25/2023   10:54 AM 01/19/2018   10:51 PM 01/19/2018    8:15 PM 10/02/2016    1:17 PM 08/06/2016   12:23 PM 07/29/2013    5:11 PM 04/25/2012    5:00 PM  Advanced Directives  Does Patient Have a Medical Advance Directive? Yes Yes Yes Yes Yes Patient does not have advance directive;Patient would not like information Patient does not have advance directive  Type of Advance Directive Healthcare Power of [place];Living will Healthcare Power of [place];Living will Healthcare Power of [place];Living will Healthcare Power of [place];Living will Healthcare Power of [place];Living will    Does patient want to make changes to medical advance directive? No - Patient declined No - Patient declined  No - Patient declined     Copy of Healthcare Power of Attorney in Chart? No - copy requested No - copy requested  No - copy requested No - copy requested    Pre-existing out of facility DNR order (yellow form or pink MOST form)      No No    Current Medications (verified) Outpatient Encounter Medications as of 02/25/2023  Medication Sig   acetaminophen (TYLENOL) 325 MG tablet Take 2 tablets (650 mg total) by mouth every 4 (four)  hours as needed for headache or mild pain.   Ascorbic Acid (VITAMIN C) 1000 MG tablet Take 1,000 mg by mouth daily.   aspirin 81 MG tablet Take 81 mg by mouth daily.   atorvastatin (LIPITOR) 20 MG tablet Take 1 tablet (20 mg total) by mouth daily.   carvedilol (COREG) 6.25 MG tablet Take 1 tablet (6.25 mg total) by mouth 2 (two) times daily.   cetirizine (ZYRTEC) 10 MG tablet Take 10 mg by mouth daily as needed for allergies. PM   clopidogrel (PLAVIX) 75 MG tablet TAKE 1 TABLET (75 MG TOTAL) BY MOUTH DAILY WITH BREAKFAST.   hydrocortisone 2.5 % cream Apply topically 2 (two) times daily as needed (Rash). Use up to 2 weeks   ketoconazole (NIZORAL) 2 % cream Apply 1 Application topically 2 (two) times daily.   NITROSTAT 0.4 MG SL tablet PLACE 1 TABLET UNDER THE TONGUE EVERY 5 MINUTES X 3 DOSES AS NEEDED FOR CHEST PAIN.   pantoprazole (PROTONIX) 40 MG tablet Take 1 tablet (40 mg total) by mouth daily at 6 (six) AM.   ranolazine (RANEXA) 500 MG 12 hr tablet Take 500 mg by mouth 2 (two) times daily.   sildenafil (VIAGRA) 100 MG tablet Take by mouth.   traZODone (DESYREL) 50 MG tablet Take 25 mg by mouth at bedtime.   valsartan (DIOVAN) 320 MG tablet TAKE 1 TABLET BY MOUTH EVERY DAY   ezetimibe (ZETIA) 10 MG tablet Take 1 tablet (10 mg total) by  mouth daily.   No facility-administered encounter medications on file as of 02/25/2023.    Allergies (verified) Norvasc [amlodipine besylate], Ace inhibitors, and Crestor [rosuvastatin]   History: Past Medical History:  Diagnosis Date   CAD (coronary artery disease), autologous vein bypass graft Nov 2014   Inf STEMI - PCI to SVG-RPDA:    CAD in native artery - prior Inferior MI - RCA PCI; 04/2012 - Inferior STEMI - RCA occluded (unable to dilate lesion adequately) + LAD & D1 disease --> referred for CABG 04/07/2012   CAD S/P percutaneous coronary angioplasty 1999   BMS to prox RCA 1999; Brachytherapy ~2003, followed by DES  PCI ([facility]) for ISR.   Coronary  stent restenosis due to progression of disease 2000; 2005   (While Still In [place]) Status post Brachytherapy to RCA ISR - 2000; Redo PCI with DES 2005   Diverticulitis    Essential hypertension    CONTROLLED ON MEDS   GERD (gastroesophageal reflux disease)    History of GI bleed     while on Plavix   History of viral meningitis    following GI bleed    History of: ST elevation myocardial infarction (STEMI) of inferior wall, subsequent episode of care April 25, 2012;   RCA: 95% ISR followed by 100% stenosis post-stent with significant RPL lesions; LAD: Tandem 80 and 70% lesions involving D1 (1, 1, 1) --> initial angioplasty to RCA aspiration thrombectomy --> urgent CABG;   Hyperlipidemia LDL goal <70    Non-recurrent unilateral inguinal hernia without obstruction or gangrene    Obesity (BMI 30.0-34.9)    Right inguinal hernia 07/02/2016   S/P CABG x 4: LIMA-LAD, seq SVG-PD-PLA; SVG-diag. 04/28/2012   S/P coronary artery stent placement Nov-Dec 2014   a) Inf STEMIL 11/28/'14 --> extensive (full metal Jacket) of SVG-RPDA Promus DES - 3 mm x 38 mm, 3 mm x 38 mm, 3 mm x 20 mm, 2.75 mm x 38 mm -- all postdilated to ~3.3 mm;; b) 12/1/'14: staged PCI to distal SVG-diagonal: Xience Xpedition 2.5 mm x 20 mm (2.66 mm)   ST elevation myocardial infarction (STEMI) of inferior wall, initial episode of care - s/p PTCA of 95% ISR to 70% and 100% distal stent occlusion to ~40% (8/25) 04/25/2012   ST elevation myocardial infarction (STEMI) of inferior wall, subsequent episode of care [facility])  Jul 29, 2013   Diffuse near occlusion of SVG-rPDA, 90% SVG-Diag   STEMI 07/29/13- successful complex full metal jacket stenting of the SVG-PDA/PLA -07/29/2013, staged SVG-Dx PCI 12/01 07/29/2013   Supraventricular tachycardia    Past Surgical History:  Procedure Laterality Date   CARDIAC CATHETERIZATION  04/28/12   RCA: 95% ISR followed by 100% stenosis post-stent with significant RPL lesions; LAD: Tandem 80 and 70%  lesions involving D1 (1, 1, 1) --> initial angioplasty to RCA aspiration thrombectomy --> urgent CABG;   CORONARY ARTERY BYPASS GRAFT  04/28/2012   Procedure: CORONARY ARTERY BYPASS GRAFTING (CABG);  Surgeon: Alleen Borne, MD;  Location: [facility] OR;  Service: Open Heart Surgery;  Laterality: N/A;   HERNIA REPAIR Left    INGUINAL HERNIA REPAIR Right 08/06/2016   Procedure: HERNIA REPAIR INGUINAL ADULT;  Surgeon: Ricarda Frame, MD;  Location: [facility];  Service: General;  Laterality: Right;   LEFT AND RIGHT HEART CATHETERIZATION WITH CORONARY ANGIOGRAM N/A 04/25/2012   Procedure: LEFT AND RIGHT HEART CATHETERIZATION WITH CORONARY ANGIOGRAM;  Surgeon: Marykay Lex, MD;  Location: [facility] CATH LAB;  Service: Cardiovascular;  Laterality:  N/A;   LEFT HEART CATH AND CORONARY ANGIOGRAPHY Left 11/25/2022   Procedure: LEFT HEART CATH AND CORONARY ANGIOGRAPHY;  Surgeon: Iran Ouch, MD;  Location: [facility] INVASIVE CV LAB;  Service: Cardiovascular;  Laterality: Left;   LEFT HEART CATHETERIZATION WITH CORONARY/GRAFT ANGIOGRAM  07/29/2013   Procedure: LEFT HEART CATHETERIZATION WITH Isabel Caprice;  Surgeon: Micheline Chapman, MD;  Location: [facility] CATH LAB;  Service: Cardiovascular; ; 95% stenosis with percent mid occlusion of SVG-RPDA; 90% anastomotic SVG-diagonal, moderate 50% disease in the native circumflex with patent OM 1 OM 2. 80-90% proximal LAD with patent LIMA to LAD , EF 50%   MOHS SURGERY     FOREHEAD   NM MYOVIEW LTD  Jan 2015   Moderate Inferior infarct (as expected), but no ischemia & EF ~45-50%   PERCUTANEOUS CORONARY STENT INTERVENTION (PCI-S)  07/29/2013   Procedure: PERCUTANEOUS CORONARY STENT INTERVENTION (PCI-S);  Surgeon: Micheline Chapman, MD;  Location: [facility] CATH LAB;  Service: Cardiovascular;; PCI to SVG-RPDA: Promus DES - 3 mm x 38 mm, 3 mm x 38 mm, 3 mm x 20 mm, 2.75 mm x 38 mm -- all postdilated to ~3.3 mm;   PERCUTANEOUS CORONARY STENT INTERVENTION (PCI-S) N/A 08/01/2013    Procedure: PERCUTANEOUS CORONARY STENT INTERVENTION (PCI-S);  Surgeon: Marykay Lex, MD;  Location: [facility] CATH LAB;  Service: Cardiovascular;staged PCI to distal SVG-diagonal: Xience Xpedition 2.5 mm x 20 mm (2.66 mm)     TRANSTHORACIC ECHOCARDIOGRAM  12/'13; 07/30/2013   a) Post CABG: EF 50-55%, moderate HK of anteroseptal wall, septal dyskinesis/dyssynergy due to poststernotomy;  grade 1 diastolic dysfunction. Mildly dilated LA, mildly reduced RV function-likely due to septal dyssynergy.; b) 11/'14 Post Inferior STEMI: EF 50-55% mild LVH. Mild hypokinesis of the distal lateral, and basal to mid inferior lateral and inferior walls -- consistent with RCA infarct   Family History  Problem Relation Age of Onset   Alzheimer's disease Mother    Heart failure Father    Coronary artery disease Father    Valvular heart disease Sister    Social History   Socioeconomic History   Marital status: Married    Spouse name: Not on file   Number of children: Not on file   Years of education: Not on file   Highest education level: Not on file  Occupational History   Not on file  Tobacco Use   Smoking status: Former    Packs/day: 1.00    Years: 10.00    Additional pack years: 0.00    Total pack years: 10.00    Types: Cigarettes    Quit date: 09/01/1976    Years since quitting: 46.5   Smokeless tobacco: Never  Vaping Use   Vaping Use: Never used  Substance and Sexual Activity   Alcohol use: Yes    Alcohol/week: 2.0 standard drinks of alcohol    Types: 2 Shots of liquor per week    Comment: 1-2 shots of jack daniels a day.   Drug use: No   Sexual activity: Yes  Other Topics Concern   Not on file  Social History Narrative   He is a married father of 3, grandfather and 2.   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   He completed cardiac rehabilitation  but as Nedra Hai got out of the habit of exercising.   Social Determinants of Health   Financial Resource Strain: Low Risk  (02/25/2023)   Overall Financial Resource Strain (CARDIA)    Difficulty of Paying Living Expenses: Not hard at all  Food Insecurity: No Food Insecurity (02/25/2023)   Hunger Vital Sign    Worried About Running Out of Food in the Last Year: Never true    Ran Out of Food in the Last Year: Never true  Transportation Needs: No Transportation Needs (02/25/2023)   PRAPARE - Administrator, Civil Service (Medical): No    Lack of Transportation (Non-Medical): No  Physical Activity: Insufficiently Active (02/25/2023)   Exercise Vital Sign    Days of Exercise per Week: 2 days    Minutes of Exercise per Session: 20 min  Stress: No Stress Concern Present (02/25/2023)   [employer] of Occupational Health - Occupational Stress Questionnaire    Feeling of Stress : Not at all  Social Connections: Socially Integrated (02/25/2023)   Social Connection and Isolation Panel [NHANES]    Frequency of Communication with Friends and Family: More than three times a week    Frequency of Social Gatherings with Friends and Family: More than three times a week    Attends Religious Services: More than 4 times per year    Active Member of [employer] or Organizations: Yes    Attends Banker Meetings: Never    Marital Status: Married    Tobacco Counseling Counseling given: Yes   Clinical Intake:  Pre-visit preparation completed: No  Pain : No/denies pain     BMI - recorded: 31.9 Nutritional Status: BMI > 30  Obese Nutritional Risks: None Diabetes: No  How often do you need to have someone help you when you read instructions, pamphlets, or other written materials from your doctor or pharmacy?: 1 - Never  Interpreter Needed?: No  Information entered by :: Arthur Holms   Activities of Daily Living    02/25/2023   10:48 AM 02/24/2023    2:41 PM  In your present state of health, do you have any difficulty performing the following activities:  Hearing? 0 0  Vision? 0 0  Difficulty concentrating or making decisions? 0 0  Walking or climbing stairs? 0 0  Dressing or bathing? 0 0  Doing errands, shopping? 0 0  Preparing Food and eating ? N N  Using the Toilet? N N  In the past six months, have you accidently leaked urine? N N  Do you have problems with loss of bowel control? N N  Managing your Medications? N N  Managing your Finances? N N  Housekeeping or managing your Housekeeping? N N    Patient Care Team: Reubin Milan, MD as PCP - General (Internal Medicine) Iran Ouch, MD as PCP - Cardiology (Cardiology) Iran Ouch, MD as Consulting Physician (Cardiology) Deirdre Evener, MD (Dermatology)  Indicate any recent Medical Services you may have received from other than [facility] providers in the past year (date may be approximate).     Assessment:   This is a routine wellness examination for Jonathan Escobar.  Hearing/Vision screen Hearing Screening - Comments:: Televisit- no problems hearing  Dietary issues and exercise activities discussed:     Goals Addressed               This Visit's Progress     Exercise 3x per week (30 min per time) (pt-stated)        "  Lose weight"       Depression Screen    02/25/2023   10:56 AM 02/25/2023   10:53 AM 01/12/2023   10:21 AM 11/06/2022   10:57 AM 08/12/2021   10:42 AM 03/12/2020    8:06 AM 03/07/2019    8:06 AM  PHQ 2/9 Scores  PHQ - 2 Score 0 0 0 0 0 0 0  PHQ- 9 Score 0 0 2 0 0 0 0    Fall Risk    02/25/2023   10:55 AM 02/24/2023    2:41 PM 01/12/2023   10:21 AM 11/06/2022   10:58 AM 08/12/2021   10:43 AM  Fall Risk   Falls in the past year? 1 0 0 1 0  Number falls in past yr: 0 0 0 0 0  Injury with Fall? 0 0 0 0 0  Risk for fall due to : No Fall Risks  No Fall Risks  History of fall(s) No Fall Risks  Follow up Falls evaluation completed;Falls prevention discussed  Falls evaluation completed Falls evaluation completed Falls evaluation completed    MEDICARE RISK AT HOME:  Medicare Risk at Home - 02/25/23 1056     Any stairs in or around the home? Yes    If so, are there any without handrails? Yes    Home free of loose throw rugs in walkways, pet beds, electrical cords, etc? Yes    Adequate lighting in your home to reduce risk of falls? Yes    Life alert? No    Use of a cane, walker or w/c? No    Grab bars in the bathroom? Yes    Shower chair or bench in shower? No    Elevated toilet seat or a handicapped toilet? No               Cognitive Function:        02/25/2023   10:57 AM  6CIT Screen  What Year? 0 points  What month? 0 points  What time? 0 points  Count back from 20 0 points  Months in reverse 0 points  Repeat phrase 0 points  Total Score 0 points    Immunizations Immunization History  Administered Date(s) Administered   Influenza-Unspecified 07/02/2009, 07/26/2009, 06/01/2010, 07/14/2011, 06/01/2013, 06/30/2013, 06/30/2014, 06/23/2015, 06/16/2016, 06/08/2017, 05/02/2018, 06/01/2020, 06/01/2021, 06/12/2022   PFIZER(Purple Top)SARS-COV-2 Vaccination 08/23/2019, 09/13/2019   Pneumococcal Conjugate-13 11/17/2014   Pneumococcal Polysaccharide-23 12/31/2010, 03/26/2016   Pneumococcal-Unspecified 08/12/2011   Tdap 02/06/2008, 11/28/2009, 12/31/2014    TDAP status: Up to date  Flu Vaccine status: Up to date  Pneumococcal vaccine status: Due, Education has been provided regarding the importance of this vaccine. Advised may receive this vaccine at local pharmacy or Health Dept. Aware to provide a copy of the vaccination record if obtained from local pharmacy or Health Dept. Verbalized acceptance and understanding.  Covid-19 vaccine status: Completed vaccines  Qualifies for Shingles Vaccine? Yes   Zostavax completed Yes    Shingrix Completed?: No.    Education has been provided regarding the importance of this vaccine. Patient has been advised to call insurance company to determine out of pocket expense if they have not yet received this vaccine. Advised may also receive vaccine at local pharmacy or Health Dept. Verbalized acceptance and understanding.  Screening Tests Health Maintenance  Topic Date Due   Zoster Vaccines- Shingrix (1 of 2) Never done   COVID-19 Vaccine (3 - Pfizer risk series) 10/11/2019   INFLUENZA VACCINE  04/02/2023   Colonoscopy  10/26/2023  DTaP/Tdap/Td (4 - Td or Tdap) 12/30/2024   Pneumonia Vaccine 74+ Years old  Completed   Hepatitis C Screening  Completed   HPV VACCINES  Aged Out    Health Maintenance  Health Maintenance Due  Topic Date Due   Zoster Vaccines- Shingrix (1 of 2) Never done   COVID-19 Vaccine (3 - Pfizer risk series) 10/11/2019    Colorectal cancer screening: Type of screening: Colonoscopy. Completed 10/25/2018. Repeat every 5 years  Lung Cancer Screening: (Low Dose CT Chest recommended if Age 60-80 years, 20 pack-year currently smoking OR have quit w/in 15years.) does/ will speak to Dr B at next appt    Additional Screening:  Hepatitis C Screening: does not qualify; Completed 03/07/2019  Vision Screening: Recommended annual ophthalmology exams for early detection of glaucoma and other disorders of the eye. Is the patient up to date with their annual eye exam?  Yes  Who is the provider or what is the name of the office in which the patient attends annual eye exams? Dr Clearance Coots   Dental Screening: Recommended annual dental exams for proper oral hygiene         Plan:     I have personally reviewed and noted the following in the patient's chart:   Medical and social history Use of alcohol, tobacco or illicit drugs  Current medications and supplements including opioid prescriptions.  Functional ability and status Nutritional status Physical  activity Advanced directives List of other physicians Hospitalizations, surgeries, and ER visits in previous 12 months Vitals Screenings to include cognitive, depression, and falls Referrals and appointments  In addition, I have reviewed and discussed with patient certain preventive protocols, quality metrics, and best practice recommendations. A written personalized care plan for preventive services as well as general preventive health recommendations were provided to patient.     Everitt Amber   02/25/2023   After Visit Summary: (MyChart) Due to this being a telephonic visit, the after visit summary with patients personalized plan was offered to patient via MyChart   Nurse Notes: Discuss lung screening at next visit

## 2023-03-12 ENCOUNTER — Ambulatory Visit: Payer: Medicare HMO | Attending: Cardiovascular Disease | Admitting: Cardiovascular Disease

## 2023-03-12 ENCOUNTER — Encounter: Payer: Self-pay | Admitting: Cardiovascular Disease

## 2023-03-12 VITALS — BP 124/90 | HR 58 | Ht 69.0 in | Wt 223.5 lb

## 2023-03-12 DIAGNOSIS — E785 Hyperlipidemia, unspecified: Secondary | ICD-10-CM | POA: Diagnosis not present

## 2023-03-12 DIAGNOSIS — I10 Essential (primary) hypertension: Secondary | ICD-10-CM

## 2023-03-12 DIAGNOSIS — R002 Palpitations: Secondary | ICD-10-CM | POA: Diagnosis not present

## 2023-03-12 DIAGNOSIS — I25118 Atherosclerotic heart disease of native coronary artery with other forms of angina pectoris: Secondary | ICD-10-CM | POA: Diagnosis not present

## 2023-03-12 MED ORDER — ATORVASTATIN CALCIUM 40 MG PO TABS: 40.0000 mg | ORAL_TABLET | Freq: Every day | ORAL | 1 refills | Status: DC

## 2023-03-12 NOTE — Patient Instructions (Signed)
Medication Instructions:  INCREASE the Atorvastatin to 40 mg once daily  *If you need a refill on your cardiac medications before your next appointment, please call your pharmacy*   Lab Work: Your provider would like for you to return in 2 months to have the following labs drawn: lipid and liver.   Please go to the [facility] entrance and check in at the front desk.  You do not need an appointment.  They are open from 7am-6 pm.  You will need to be fasting.  If you have labs (blood work) drawn today and your tests are completely normal, you will receive your results only by: MyChart Message (if you have MyChart) OR A paper copy in the mail If you have any lab test that is abnormal or we need to change your treatment, we will call you to review the results.   Testing/Procedures: None ordered   Follow-Up: At [facility], you and your health needs are our priority.  As part of our continuing mission to provide you with exceptional heart care, we have created designated Provider Care Teams.  These Care Teams include your primary Cardiologist (physician) and Advanced Practice Providers (APPs -  Physician Assistants and Nurse Practitioners) who all work together to provide you with the care you need, when you need it.  We recommend signing up for the patient portal called "MyChart".  Sign up information is provided on this After Visit Summary.  MyChart is used to connect with patients for Virtual Visits (Telemedicine).  Patients are able to view lab/test results, encounter notes, upcoming appointments, etc.  Non-urgent messages can be sent to your provider as well.   To learn more about what you can do with MyChart, go to [email or web address].    Your next appointment:   6 month(s)  Provider:   You may see Lorine Bears, MD or one of the following Advanced Practice Providers on your designated Care Team:   Nicolasa Ducking, NP Eula Listen, PA-C Cadence Fransico Michael,  PA-C Charlsie Quest, NP

## 2023-03-12 NOTE — Progress Notes (Signed)
Cardiology Office Note   Date:  03/12/2023   ID:  Freedom, Peddy Feb 24, 1946, MRN [id number]  PCP:  Reubin Milan, MD  Cardiologist:   Lorine Bears, MD   Chief Complaint  Patient presents with   Follow-up    3 month f/u no complaints today. Meds reviewed verbally with pt.      History of Present Illness: Jonathan Escobar is a 77 y.o. male who presents for a follow up visit regarding coronary artery disease. He is a former CT surgical PA with extensive cardiac history:  Initial cardiac event was in 1999 with bare-metal stent to the proximal RCA -> had brachytherapy in 2000 and 2003 then redo PCI with a Taxus DES in 2005 for ISR. Inferior STEMI 04/25/2012 - 95% ISR followed by 5% stenosis post stent with lesions in the PL and PDA. Also noted 70% mid LAD; after aspiration thrombectomy, balloon angioplasty performed and he was sent for urgent CABG  (LIMA-LAD, SVG-PDA and PLA, SVG-Diag)  Inferior STEMI in November 2014 with occluded SVG-PDA --> extensive PCI (full metal jacket) of SVG followed by staged PCI of anastomotic SVG-diagonal lesion. No cardiac events since then.   He is known to have short runs of SVT and PACs.   He has known history of essential hypertension with poor tolerance to medications.  He tried small dose amlodipine and small dose hydrochlorothiazide but did not feel well with both medications.  He had worsening angina earlier this year. He underwent a treadmill nuclear stress test and was able to exercise for 6 minutes.  He had 1 mm of ST depression on EKG.  Nuclear imaging showed moderate size ischemia in the basal to mid inferolateral region.  Cardiac catheterization in March of this year showed patent LIMA to LAD, occluded SVG to diagonal and patent SVG to right PDA/PL.  The SVG to the RCA is known to be a full metal jacket from the ostium all the way distally.  Minimal restenosis was noted in the mid to distal area but there was 40% eccentric  restenosis in the proximal stent.  There was severe disease in the right posterior AV groove and PL branches as well.  I recommended medical therapy and started Ranexa 500 mg twice daily.  He reports significant  improvement in symptoms with Ranexa with minimal chest discomfort at the present time. No shortness of breath or palpitations.  Past Medical History:  Diagnosis Date   CAD (coronary artery disease), autologous vein bypass graft Nov 2014   Inf STEMI - PCI to SVG-RPDA:    CAD in native artery - prior Inferior MI - RCA PCI; 04/2012 - Inferior STEMI - RCA occluded (unable to dilate lesion adequately) + LAD & D1 disease --> referred for CABG 04/07/2012   CAD S/P percutaneous coronary angioplasty 1999   BMS to prox RCA 1999; Brachytherapy ~2003, followed by DES  PCI (Taxus) for ISR.   Coronary stent restenosis due to progression of disease 2000; 2005   (While Still In [place]) Status post Brachytherapy to RCA ISR - 2000; Redo PCI with DES 2005   Diverticulitis    Essential hypertension    CONTROLLED ON MEDS   GERD (gastroesophageal reflux disease)    History of GI bleed     while on Plavix   History of viral meningitis    following GI bleed    History of: ST elevation myocardial infarction (STEMI) of inferior wall, subsequent episode of care April 25, 2012;  RCA: 95% ISR followed by 100% stenosis post-stent with significant RPL lesions; LAD: Tandem 80 and 70% lesions involving D1 (1, 1, 1) --> initial angioplasty to RCA aspiration thrombectomy --> urgent CABG;   Hyperlipidemia LDL goal <70    Non-recurrent unilateral inguinal hernia without obstruction or gangrene    Obesity (BMI 30.0-34.9)    Right inguinal hernia 07/02/2016   S/P CABG x 4: LIMA-LAD, seq SVG-PD-PLA; SVG-diag. 04/28/2012   S/P coronary artery stent placement Nov-Dec 2014   a) Inf STEMIL 11/28/'14 --> extensive (full metal Jacket) of SVG-RPDA Promus DES - 3 mm x 38 mm, 3 mm x 38 mm, 3 mm x 20 mm, 2.75 mm x 38 mm -- all  postdilated to ~3.3 mm;; b) 12/1/'14: staged PCI to distal SVG-diagonal: Xience Xpedition 2.5 mm x 20 mm (2.66 mm)   ST elevation myocardial infarction (STEMI) of inferior wall, initial episode of care - s/p PTCA of 95% ISR to 70% and 100% distal stent occlusion to ~40% (8/25) 04/25/2012   ST elevation myocardial infarction (STEMI) of inferior wall, subsequent episode of care [facility])  Jul 29, 2013   Diffuse near occlusion of SVG-rPDA, 90% SVG-Diag   STEMI 07/29/13- successful complex full metal jacket stenting of the SVG-PDA/PLA -07/29/2013, staged SVG-Dx PCI 12/01 07/29/2013   Supraventricular tachycardia     Past Surgical History:  Procedure Laterality Date   CARDIAC CATHETERIZATION  04/28/12   RCA: 95% ISR followed by 100% stenosis post-stent with significant RPL lesions; LAD: Tandem 80 and 70% lesions involving D1 (1, 1, 1) --> initial angioplasty to RCA aspiration thrombectomy --> urgent CABG;   CORONARY ARTERY BYPASS GRAFT  04/28/2012   Procedure: CORONARY ARTERY BYPASS GRAFTING (CABG);  Surgeon: Alleen Borne, MD;  [facility] OR;  Service: Open Heart Surgery;  Laterality: N/A;   HERNIA REPAIR Left    INGUINAL HERNIA REPAIR Right 08/06/2016   Procedure: HERNIA REPAIR INGUINAL ADULT;  Surgeon: Ricarda Frame, MD;  Location: [facility];  Service: General;  Laterality: Right;   LEFT AND RIGHT HEART CATHETERIZATION WITH CORONARY ANGIOGRAM N/A 04/25/2012   Procedure: LEFT AND RIGHT HEART CATHETERIZATION WITH CORONARY ANGIOGRAM;  Surgeon: Marykay Lex, MD;  Location: [facility] CATH LAB;  Service: Cardiovascular;  Laterality: N/A;   LEFT HEART CATH AND CORONARY ANGIOGRAPHY Left 11/25/2022   Procedure: LEFT HEART CATH AND CORONARY ANGIOGRAPHY;  Surgeon: Iran Ouch, MD;  Location: [facility] INVASIVE CV LAB;  Service: Cardiovascular;  Laterality: Left;   LEFT HEART CATHETERIZATION WITH CORONARY/GRAFT ANGIOGRAM  07/29/2013   Procedure: LEFT HEART CATHETERIZATION WITH Isabel Caprice;   Surgeon: Micheline Chapman, MD;  Location: [facility] CATH LAB;  Service: Cardiovascular; ; 95% stenosis with percent mid occlusion of SVG-RPDA; 90% anastomotic SVG-diagonal, moderate 50% disease in the native circumflex with patent OM 1 OM 2. 80-90% proximal LAD with patent LIMA to LAD , EF 50%   MOHS SURGERY     FOREHEAD   NM MYOVIEW LTD  Jan 2015   Moderate Inferior infarct (as expected), but no ischemia & EF ~45-50%   PERCUTANEOUS CORONARY STENT INTERVENTION (PCI-S)  07/29/2013   Procedure: PERCUTANEOUS CORONARY STENT INTERVENTION (PCI-S);  Surgeon: Micheline Chapman, MD;  Location: [facility] CATH LAB;  Service: Cardiovascular;; PCI to SVG-RPDA: Promus DES - 3 mm x 38 mm, 3 mm x 38 mm, 3 mm x 20 mm, 2.75 mm x 38 mm -- all postdilated to ~3.3 mm;   PERCUTANEOUS CORONARY STENT INTERVENTION (PCI-S) N/A 08/01/2013   Procedure: PERCUTANEOUS CORONARY  STENT INTERVENTION (PCI-S);  Surgeon: Marykay Lex, MD;  Location: [facility] CATH LAB;  Service: Cardiovascular;staged PCI to distal SVG-diagonal: Xience Xpedition 2.5 mm x 20 mm (2.66 mm)     TRANSTHORACIC ECHOCARDIOGRAM  12/'13; 07/30/2013   a) Post CABG: EF 50-55%, moderate HK of anteroseptal wall, septal dyskinesis/dyssynergy due to poststernotomy;  grade 1 diastolic dysfunction. Mildly dilated LA, mildly reduced RV function-likely due to septal dyssynergy.; b) 11/'14 Post Inferior STEMI: EF 50-55% mild LVH. Mild hypokinesis of the distal lateral, and basal to mid inferior lateral and inferior walls -- consistent with RCA infarct     Current Outpatient Medications  Medication Sig Dispense Refill   acetaminophen (TYLENOL) 325 MG tablet Take 2 tablets (650 mg total) by mouth every 4 (four) hours as needed for headache or mild pain.     Ascorbic Acid (VITAMIN C) 1000 MG tablet Take 1,000 mg by mouth daily.     aspirin 81 MG tablet Take 81 mg by mouth daily.     atorvastatin (LIPITOR) 20 MG tablet Take 1 tablet (20 mg total) by mouth daily. 90 tablet 3   carvedilol (COREG)  6.25 MG tablet Take 1 tablet (6.25 mg total) by mouth 2 (two) times daily. 180 tablet 3   cetirizine (ZYRTEC) 10 MG tablet Take 10 mg by mouth daily as needed for allergies. PM     clopidogrel (PLAVIX) 75 MG tablet TAKE 1 TABLET (75 MG TOTAL) BY MOUTH DAILY WITH BREAKFAST. 90 tablet 0   ezetimibe (ZETIA) 10 MG tablet Take 1 tablet (10 mg total) by mouth daily. 90 tablet 3   hydrocortisone 2.5 % cream Apply topically 2 (two) times daily as needed (Rash). Use up to 2 weeks 30 g 11   ketoconazole (NIZORAL) 2 % cream Apply 1 Application topically 2 (two) times daily. 60 g 11   NITROSTAT 0.4 MG SL tablet PLACE 1 TABLET UNDER THE TONGUE EVERY 5 MINUTES X 3 DOSES AS NEEDED FOR CHEST PAIN. 25 tablet 0   pantoprazole (PROTONIX) 40 MG tablet Take 1 tablet (40 mg total) by mouth daily at 6 (six) AM. 90 tablet 3   ranolazine (RANEXA) 500 MG 12 hr tablet Take 500 mg by mouth 2 (two) times daily.     sildenafil (VIAGRA) 100 MG tablet Take by mouth.     traZODone (DESYREL) 50 MG tablet Take 25 mg by mouth at bedtime.     valsartan (DIOVAN) 320 MG tablet TAKE 1 TABLET BY MOUTH EVERY DAY 90 tablet 2   No current facility-administered medications for this visit.    Allergies:   Norvasc [amlodipine besylate], Ace inhibitors, and Crestor [rosuvastatin]    Social History:  The patient  reports that he quit smoking about 46 years ago. His smoking use included cigarettes. He started smoking about 56 years ago. He has a 10 pack-year smoking history. He has never used smokeless tobacco. He reports current alcohol use of about 2.0 standard drinks of alcohol per week. He reports that he does not use drugs.   Family History:  The patient's family history includes Alzheimer's disease in his mother; Coronary artery disease in his father; Heart failure in his father; Valvular heart disease in his sister.    ROS:  Please see the history of present illness.   Otherwise, review of systems are positive for none.   All other  systems are reviewed and negative.    PHYSICAL EXAM: VS:  BP (!) 124/90 (BP Location: Left Arm, Patient Position: Sitting, Cuff  Size: Normal)   Pulse (!) 58   Ht 5\' 9"  (1.753 m)   Wt 223 lb 8 oz (101.4 kg)   SpO2 98%   BMI 33.01 kg/m  , BMI Body mass index is 33.01 kg/m. GEN: Well nourished, well developed, in no acute distress  HEENT: normal  Neck: no JVD,  or masses. Faint bilateral carotid bruits Cardiac: RRR; no murmurs, rubs, or gallops,no edema  Respiratory:  clear to auscultation bilaterally, normal work of breathing GI: soft, nontender, nondistended, + BS MS: no deformity or atrophy  Skin: warm and dry, no rash Neuro:  Strength and sensation are intact Psych: euthymic mood, full affect   EKG:  EKG is not ordered today.    Recent Labs: 11/06/2022: ALT 31; BUN 11; Creatinine, Ser 1.02; Hemoglobin 14.2; Platelets 255; Potassium 4.6; Sodium 137    Lipid Panel    Component Value Date/Time   CHOL 149 11/06/2022 1127   CHOL 153 09/07/2012 1201   TRIG 169 (H) 11/06/2022 1127   TRIG 185 09/07/2012 1201   HDL 46 11/06/2022 1127   HDL 31 (L) 09/07/2012 1201   CHOLHDL 3.2 11/06/2022 1127   CHOLHDL 2.9 03/12/2020 0920   VLDL 20 03/12/2020 0920   VLDL 37 09/07/2012 1201   LDLCALC 74 11/06/2022 1127   LDLCALC 85 09/07/2012 1201      Wt Readings from Last 3 Encounters:  03/12/23 223 lb 8 oz (101.4 kg)  02/25/23 216 lb (98 kg)  01/12/23 219 lb (99.3 kg)          No data to display            ASSESSMENT AND PLAN:  1. Coronary artery disease involving bypass graft with stable class II angina: Continue long-term dual antiplatelet therapy. Significant improvement in angina with Ranexa which will be continued.  2. Palpitations: These were thought to be due to short runs of SVT as well as PACs.  His symptoms are reasonably controlled with carvedilol.     3. Hyperlipidemia: He did not tolerate with high-dose rosuvastatin in the past but has been doing well with   atorvastatin and ezetimibe.  Most recent LDL was 74.  I elected to increase atorvastatin to 40 mg once daily and will recheck lipid and liver profile in 2 months.  Recommend getting his LDL below 55 and I did discuss with him the option of PCSK9 inhibitors today.  4. Essential hypertension: Blood pressure is controlled on current medications.   Disposition:   FU with me in 6 months  Signed,  Lorine Bears, MD  03/12/2023 9:58 AM    [facility]

## 2023-05-20 ENCOUNTER — Other Ambulatory Visit: Payer: Self-pay | Admitting: *Deleted

## 2023-05-20 MED ORDER — ATORVASTATIN CALCIUM 20 MG PO TABS: 20.0000 mg | ORAL_TABLET | Freq: Every day | ORAL | Status: AC

## 2023-05-20 NOTE — Progress Notes (Signed)
Patient stated that he was unable to tolerate the Atorvastatin 40 mg once daily He has switched back to the 20 mg and Zetia.

## 2023-05-26 ENCOUNTER — Ambulatory Visit: Payer: Medicare HMO | Admitting: Cardiovascular Disease

## 2023-06-25 DIAGNOSIS — M3501 Sicca syndrome with keratoconjunctivitis: Secondary | ICD-10-CM | POA: Diagnosis not present

## 2023-06-25 DIAGNOSIS — H02889 Meibomian gland dysfunction of unspecified eye, unspecified eyelid: Secondary | ICD-10-CM | POA: Diagnosis not present

## 2023-06-25 DIAGNOSIS — D3131 Benign neoplasm of right choroid: Secondary | ICD-10-CM | POA: Diagnosis not present

## 2023-06-25 DIAGNOSIS — H2513 Age-related nuclear cataract, bilateral: Secondary | ICD-10-CM | POA: Diagnosis not present

## 2023-07-15 ENCOUNTER — Encounter: Payer: Self-pay | Admitting: Ophthalmology

## 2023-07-20 NOTE — Discharge Instructions (Signed)

## 2023-07-21 ENCOUNTER — Encounter
Admission: RE | Disposition: A | Discharge status: Home / Self Care | Payer: Self-pay | Source: Home / Self Care | Attending: Ophthalmology

## 2023-07-21 ENCOUNTER — Ambulatory Visit
Admission: RE | Admit: 2023-07-21 | Discharge: 2023-07-21 | Disposition: A | Discharge status: Home / Self Care | Payer: Medicare HMO | Attending: Ophthalmology | Admitting: Ophthalmology

## 2023-07-21 ENCOUNTER — Ambulatory Visit: Payer: Medicare HMO | Admitting: Anesthesiology

## 2023-07-21 ENCOUNTER — Other Ambulatory Visit: Payer: Self-pay

## 2023-07-21 ENCOUNTER — Encounter: Payer: Self-pay | Admitting: Ophthalmology

## 2023-07-21 DIAGNOSIS — K219 Gastro-esophageal reflux disease without esophagitis: Secondary | ICD-10-CM | POA: Diagnosis not present

## 2023-07-21 DIAGNOSIS — Z87891 Personal history of nicotine dependence: Secondary | ICD-10-CM | POA: Insufficient documentation

## 2023-07-21 DIAGNOSIS — I252 Old myocardial infarction: Secondary | ICD-10-CM | POA: Insufficient documentation

## 2023-07-21 DIAGNOSIS — I10 Essential (primary) hypertension: Secondary | ICD-10-CM | POA: Diagnosis not present

## 2023-07-21 DIAGNOSIS — H2511 Age-related nuclear cataract, right eye: Secondary | ICD-10-CM | POA: Diagnosis not present

## 2023-07-21 DIAGNOSIS — I2510 Atherosclerotic heart disease of native coronary artery without angina pectoris: Secondary | ICD-10-CM | POA: Diagnosis not present

## 2023-07-21 DIAGNOSIS — I25119 Atherosclerotic heart disease of native coronary artery with unspecified angina pectoris: Secondary | ICD-10-CM | POA: Insufficient documentation

## 2023-07-21 HISTORY — DX: Plantar fascial fibromatosis: M72.2

## 2023-07-21 HISTORY — PX: CATARACT EXTRACTION W/PHACO: SHX586

## 2023-07-21 HISTORY — DX: Trigger finger, left little finger: M65.352

## 2023-07-21 HISTORY — DX: Other ill-defined heart diseases: I51.89

## 2023-07-21 SURGERY — PHACOEMULSIFICATION, CATARACT, WITH IOL INSERTION
Anesthesia: Monitor Anesthesia Care | Site: Eye | Laterality: Right

## 2023-07-21 MED ORDER — ARMC OPHTHALMIC DILATING DROPS
OPHTHALMIC | Status: AC
  Filled 2023-07-21: qty 0.5

## 2023-07-21 MED ORDER — TETRACAINE HCL 0.5 % OP SOLN
1.0000 [drp] | OPHTHALMIC | Status: DC | PRN
  Administered 2023-07-21 (×3): 1 [drp] via OPHTHALMIC

## 2023-07-21 MED ORDER — MIDAZOLAM HCL 2 MG/2ML IJ SOLN
INTRAMUSCULAR | Status: AC
  Filled 2023-07-21: qty 2

## 2023-07-21 MED ORDER — MIDAZOLAM HCL 2 MG/2ML IJ SOLN
INTRAMUSCULAR | Status: DC | PRN
  Administered 2023-07-21: 1 mg via INTRAVENOUS

## 2023-07-21 MED ORDER — ARMC OPHTHALMIC DILATING DROPS
1.0000 | OPHTHALMIC | Status: DC | PRN
  Administered 2023-07-21 (×3): 1 via OPHTHALMIC

## 2023-07-21 MED ORDER — SIGHTPATH DOSE#1 BSS IO SOLN
INTRAOCULAR | Status: DC | PRN
  Administered 2023-07-21: 15 mL

## 2023-07-21 MED ORDER — FENTANYL CITRATE (PF) 100 MCG/2ML IJ SOLN
INTRAMUSCULAR | Status: AC
  Filled 2023-07-21: qty 2

## 2023-07-21 MED ORDER — SIGHTPATH DOSE#1 NA CHONDROIT SULF-NA HYALURON 40-17 MG/ML IO SOLN
INTRAOCULAR | Status: DC | PRN
  Administered 2023-07-21: 1 mL via INTRAOCULAR

## 2023-07-21 MED ORDER — MOXIFLOXACIN HCL 0.5 % OP SOLN
OPHTHALMIC | Status: DC | PRN
  Administered 2023-07-21: .2 mL via OPHTHALMIC

## 2023-07-21 MED ORDER — SIGHTPATH DOSE#1 BSS IO SOLN
INTRAOCULAR | Status: DC | PRN
  Administered 2023-07-21: 1 mL via INTRAMUSCULAR

## 2023-07-21 MED ORDER — SIGHTPATH DOSE#1 BSS IO SOLN
INTRAOCULAR | Status: DC | PRN
  Administered 2023-07-21: 46 mL via OPHTHALMIC

## 2023-07-21 MED ORDER — BRIMONIDINE TARTRATE-TIMOLOL 0.2-0.5 % OP SOLN
OPHTHALMIC | Status: DC | PRN
  Administered 2023-07-21: 1 [drp] via OPHTHALMIC

## 2023-07-21 MED ORDER — TETRACAINE HCL 0.5 % OP SOLN
OPHTHALMIC | Status: AC
  Filled 2023-07-21: qty 4

## 2023-07-21 MED ORDER — FENTANYL CITRATE (PF) 100 MCG/2ML IJ SOLN
INTRAMUSCULAR | Status: DC | PRN
  Administered 2023-07-21: 50 ug via INTRAVENOUS

## 2023-07-21 SURGICAL SUPPLY — 12 items
ANGLE REVERSE CUT SHRT 25GA (CUTTER) ×1
CATARACT SUITE SIGHTPATH (MISCELLANEOUS) ×1
CYSTOTOME ANGL RVRS SHRT 25G (CUTTER) ×1 IMPLANT
FEE CATARACT SUITE SIGHTPATH (MISCELLANEOUS) ×1 IMPLANT
GLOVE BIOGEL PI IND STRL 8 (GLOVE) ×1 IMPLANT
GLOVE SURG LX STRL 8.0 MICRO (GLOVE) ×1 IMPLANT
LENS CLAREON VIVITY TORIC 22.0 ×1 IMPLANT
LENS CLRN VIVITY TORIC 3 22.0 ×1 IMPLANT
LENS IOL CLRN VT TRC 3 22.0 IMPLANT
NDL FILTER BLUNT 18X1 1/2 (NEEDLE) ×1 IMPLANT
NEEDLE FILTER BLUNT 18X1 1/2 (NEEDLE) ×1
SYR 3ML LL SCALE MARK (SYRINGE) ×1 IMPLANT

## 2023-07-21 NOTE — Transfer of Care (Signed)
Immediate Anesthesia Transfer of Care Note  Patient: Jonathan Escobar  Procedure(s) Performed: CATARACT EXTRACTION PHACO AND INTRAOCULAR LENS PLACEMENT (IOC) RIGHT  CLAREON VIVITY TORIC  6.70  00:40.5 (Right: Eye)  Patient Location: PACU  Anesthesia Type:MAC  Level of Consciousness: awake, alert , and oriented  Airway & Oxygen Therapy: Patient Spontanous Breathing and Patient connected to nasal cannula oxygen  Post-op Assessment: Report given to RN, Post -op Vital signs reviewed and stable, and Patient moving all extremities X 4  Post vital signs: Reviewed and stable  Last Vitals:  Vitals Value Taken Time  BP    Temp    Pulse 45 07/21/23 0909  Resp 13 07/21/23 0909  SpO2 96 % 07/21/23 0909  Vitals shown include unfiled device data.  Last Pain:  Vitals:   07/21/23 0759  TempSrc: Temporal  PainSc: 0-No pain         Complications: No notable events documented.

## 2023-07-21 NOTE — Op Note (Signed)
PREOPERATIVE DIAGNOSIS:  Nuclear sclerotic cataract of the right eye.   POSTOPERATIVE DIAGNOSIS:  Nuclear sclerotic cataract of the right eye.   OPERATIVE PROCEDURE: Procedure(s): CATARACT EXTRACTION PHACO AND INTRAOCULAR LENS PLACEMENT (IOC) RIGHT  CLAREON VIVITY TORIC  6.70  00:40.5   SURGEON:  Galen Manila, MD.   ANESTHESIA: 1.      Managed anesthesia care. 2.     0.69ml of Shugarcaine was instilled following the paracentesis  Anesthesiologist: Marisue Humble, MD CRNA: Lanell Matar, CRNA  COMPLICATIONS:  None.   TECHNIQUE:   Stop and chop    DESCRIPTION OF PROCEDURE:  The patient was examined and consented in the preoperative holding area where the aforementioned topical anesthesia was applied to the right eye.  The patient was brought back to the Operating Room where he was sat upright on the gurney and given a target to fixate upon while the eye was marked at the 3:00 and 9:00 position.  The patient was then reclined on the operating table.  The eye was prepped and draped in the usual sterile ophthalmic fashion and a lid speculum was placed. A paracentesis was created with the side port blade and the anterior chamber was filled with viscoelastic. A near clear corneal incision was performed with the steel keratome. A continuous curvilinear capsulorrhexis was performed with a cystotome followed by the capsulorrhexis forceps. Hydrodissection and hydrodelineation were carried out with BSS on a blunt cannula. The lens was removed in a stop and chop technique and the remaining cortical material was removed with the irrigation-aspiration handpiece. The eye was inflated with viscoelastic and the CNWET  lens  was placed in the eye and rotated to within a few degrees of the predetermined orientation.  The remaining viscoelastic was removed from the eye.  The Sinskey hook was used to rotate the toric lens into its final resting place at 180 degrees.  0. The eye was inflated to a physiologic  pressure and found to be watertight. 0.47ml of Vigamox was placed in the anterior chamber.  The eye was dressed with Vigamox. The patient was given protective glasses to wear throughout the day and a shield with which to sleep tonight. The patient was also given drops with which to begin a drop regimen today and will follow-up with me in one day. Implant Name Type Inv. Item Serial No. Manufacturer Lot No. LRB No. Used Action  LENS CLAREON VIVITY TORIC 22.0 - [id number]  LENS CLAREON VIVITY TORIC 22.0 [id number] [employer]  Right 1 Implanted   Procedure(s): CATARACT EXTRACTION PHACO AND INTRAOCULAR LENS PLACEMENT (IOC) RIGHT  CLAREON VIVITY TORIC  6.70  00:40.5 (Right)  Electronically signed: Kilen Boblitt 07/21/2023 9:07 AM

## 2023-07-21 NOTE — Anesthesia Preprocedure Evaluation (Addendum)
Anesthesia Evaluation  Patient identified by MRN, date of birth, ID band Patient awake    Reviewed: Allergy & Precautions, H&P , NPO status , Patient's Chart, lab work & pertinent test results  Airway Mallampati: III  TM Distance: >3 FB Neck ROM: Full    Dental no notable dental hx.    Pulmonary former smoker   Pulmonary exam normal breath sounds clear to auscultation       Cardiovascular hypertension, + angina  + CAD and + Past MI  Normal cardiovascular exam Rhythm:Regular Rate:Normal   ST elevation myocardial infarction (STEMI) of inferior wall, initial episode of care - s/p PTCA of 95% ISR to 70% and 100% distal stent occlusion to ~40% (8/25)   STEMI 07/29/13- successful complex full metal jacket stenting of the SVG-PDA/PLA -07/29/2013, staged SVG-Dx PCI 12/01  08-30-21  1. Left ventricular ejection fraction, by estimation, is 55%. The left  ventricle has normal function. The left ventricle has no regional wall  motion abnormalities. Left ventricular diastolic parameters are consistent  with Grade I diastolic dysfunction  (impaired relaxation).   2. Left atrial size was mildly dilated.   3. The aortic valve is tricuspid. Aortic valve regurgitation is not  visualized.   4. Aortic dilatation noted. There is mild dilatation of the aortic root,  measuring 42 mm.   11-25-22   Prox LAD to Mid LAD lesion is 90% stenosed.   Mid LAD lesion is 100% stenosed.   2nd Diag lesion is 100% stenosed.   Prox RCA to Mid RCA lesion is 100% stenosed.   Ost RCA to Prox RCA lesion is 80% stenosed.   Origin lesion is 100% stenosed.   Prox Graft to Dist Graft lesion is 10% stenosed.   Origin to Prox Graft lesion is 40% stenosed.   RPAV-1 lesion is 60% stenosed.   RPAV-2 lesion is 90% stenosed.   SVG graft was visualized by angiography.   LIMA graft was visualized by angiography and is normal in caliber.   The graft exhibits no  disease.   The left ventricular systolic function is normal.   LV end diastolic pressure is moderately elevated.   The left ventricular ejection fraction is 50-55% by visual estimate.   1.  Significant underlying two-vessel coronary artery disease with patent LIMA to LAD, occluded SVG to diagonal and patent SVG to right PDA/PL. the native right coronary artery stent is chronically occluded for many years.  The SVG to right PDA/PL is a full metal jacket from the ostium all the way distally.  The stents are patent with minimal restenosis except in the proximal portion that has 40% eccentric restenosis.  There is also small vessel disease involving PL branches which might account for the ischemia on nuclear stress test. 2.  Low normal LV systolic function with an EF of 50% with moderately elevated left ventricular end-diastolic pressure at 22 mmHg. 3.  Avoid catheterization via the left radial artery in the future due to small vessel size and inability to advance the wire.  The procedure was done successfully via the right femoral artery.   Recommendations: Recommend maximizing antianginal therapy.  I elected to add Ranexa 500 mg twice daily. Given elevated LVEDP and low normal ejection fraction, consider switching ARB to Entresto.    Neuro/Psych negative neurological ROS  negative psych ROS   GI/Hepatic Neg liver ROS,GERD  ,,  Endo/Other  negative endocrine ROS    Renal/GU negative Renal ROS  negative genitourinary   Musculoskeletal negative musculoskeletal ROS (+)  Abdominal   Peds negative pediatric ROS (+)  Hematology negative hematology ROS (+)   Anesthesia Other Findings SVT Previous  inferior wall STEMI CABG, stent GERD GI bleed Grade I diastolic dysfunciton   Reproductive/Obstetrics negative OB ROS                             Anesthesia Physical Anesthesia Plan  ASA: 3  Anesthesia Plan: MAC   Post-op Pain Management:     Induction: Intravenous  PONV Risk Score and Plan:   Airway Management Planned: Natural Airway and Nasal Cannula  Additional Equipment:   Intra-op Plan:   Post-operative Plan:   Informed Consent: I have reviewed the patients History and Physical, chart, labs and discussed the procedure including the risks, benefits and alternatives for the proposed anesthesia with the patient or authorized representative who has indicated his/her understanding and acceptance.     Dental Advisory Given  Plan Discussed with: Anesthesiologist, CRNA and Surgeon  Anesthesia Plan Comments: (Patient consented for risks of anesthesia including but not limited to:  - adverse reactions to medications - damage to eyes, teeth, lips or other oral mucosa - nerve damage due to positioning  - sore throat or hoarseness - Damage to heart, brain, nerves, lungs, other parts of body or loss of life  Patient voiced understanding and assent.)        Anesthesia Quick Evaluation

## 2023-07-21 NOTE — Anesthesia Postprocedure Evaluation (Signed)
Anesthesia Post Note  Patient: Jonathan Escobar  Procedure(s) Performed: CATARACT EXTRACTION PHACO AND INTRAOCULAR LENS PLACEMENT (IOC) RIGHT  CLAREON VIVITY TORIC  6.70  00:40.5 (Right: Eye)  Patient location during evaluation: PACU Anesthesia Type: MAC Level of consciousness: awake and alert Pain management: pain level controlled Vital Signs Assessment: post-procedure vital signs reviewed and stable Respiratory status: spontaneous breathing, nonlabored ventilation, respiratory function stable and patient connected to nasal cannula oxygen Cardiovascular status: stable and blood pressure returned to baseline Postop Assessment: no apparent nausea or vomiting Anesthetic complications: no   No notable events documented.   Last Vitals:  Vitals:   07/21/23 0909 07/21/23 0911  BP: (!) 125/93 121/80  Pulse: (!) 45 (!) 55  Resp: 13 12  Temp: 36.8 C   SpO2: 96% (!) 87%    Last Pain:  Vitals:   07/21/23 0911  TempSrc:   PainSc: 0-No pain                 Clois Treanor C Magnolia Mattila

## 2023-07-21 NOTE — H&P (Signed)
[facility]   Primary Care Physician:  Reubin Milan, MD Ophthalmologist: Dr. Maren Reamer  Pre-Procedure History & Physical: HPI:  Jonathan Escobar is a 77 y.o. male here for cataract surgery.   Past Medical History:  Diagnosis Date   CAD (coronary artery disease), autologous vein bypass graft 07/2013   Inf STEMI - PCI to SVG-RPDA:    CAD in native artery - prior Inferior MI - RCA PCI; 04/2012 - Inferior STEMI - RCA occluded (unable to dilate lesion adequately) + LAD & D1 disease --> referred for CABG 04/07/2012   CAD S/P percutaneous coronary angioplasty 1999   BMS to prox RCA 1999; Brachytherapy ~2003, followed by DES  PCI ([facility]) for ISR.   Coronary stent restenosis due to progression of disease 2000; 2005   (While Still In [place]) Status post Brachytherapy to RCA ISR - 2000; Redo PCI with DES 2005   Diverticulitis    Essential hypertension    CONTROLLED ON MEDS   GERD (gastroesophageal reflux disease)    Grade I diastolic dysfunction    History of GI bleed     while on Plavix   History of viral meningitis    following GI bleed    History of: ST elevation myocardial infarction (STEMI) of inferior wall, subsequent episode of care 04/25/2012   RCA: 95% ISR followed by 100% stenosis post-stent with significant RPL lesions; LAD: Tandem 80 and 70% lesions involving D1 (1, 1, 1) --> initial angioplasty to RCA aspiration thrombectomy --> urgent CABG;   Hyperlipidemia LDL goal <70    Non-recurrent unilateral inguinal hernia without obstruction or gangrene    Obesity (BMI 30.0-34.9)    Plantar fasciitis    left heel   Right inguinal hernia 07/02/2016   S/P CABG x 4: LIMA-LAD, seq SVG-PD-PLA; SVG-diag. 04/28/2012   S/P coronary artery stent placement Nov-Dec 2014   a) Inf STEMIL 11/28/'14 --> extensive (full metal Jacket) of SVG-RPDA Promus DES - 3 mm x 38 mm, 3 mm x 38 mm, 3 mm x 20 mm, 2.75 mm x 38 mm -- all postdilated to ~3.3 mm;; b) 12/1/'14: staged PCI to distal  SVG-diagonal: Xience Xpedition 2.5 mm x 20 mm (2.66 mm)   ST elevation myocardial infarction (STEMI) of inferior wall, initial episode of care - s/p PTCA of 95% ISR to 70% and 100% distal stent occlusion to ~40% (8/25) 04/25/2012   ST elevation myocardial infarction (STEMI) of inferior wall, subsequent episode of care (HCC) 07/29/2013   Diffuse near occlusion of SVG-rPDA, 90% SVG-Diag   STEMI 07/29/13- successful complex full metal jacket stenting of the SVG-PDA/PLA -07/29/2013, staged SVG-Dx PCI 12/01 07/29/2013   Supraventricular tachycardia (HCC)    Trigger finger, left little finger     Past Surgical History:  Procedure Laterality Date   CARDIAC CATHETERIZATION  04/28/12   RCA: 95% ISR followed by 100% stenosis post-stent with significant RPL lesions; LAD: Tandem 80 and 70% lesions involving D1 (1, 1, 1) --> initial angioplasty to RCA aspiration thrombectomy --> urgent CABG;   CORONARY ARTERY BYPASS GRAFT  04/28/2012   Procedure: CORONARY ARTERY BYPASS GRAFTING (CABG);  Surgeon: Alleen Borne, MD;  Location: [facility] OR;  Service: Open Heart Surgery;  Laterality: N/A;   HERNIA REPAIR Left    INGUINAL HERNIA REPAIR Right 08/06/2016   Procedure: HERNIA REPAIR INGUINAL ADULT;  Surgeon: Ricarda Frame, MD;  Location: [facility];  Service: General;  Laterality: Right;   LEFT AND RIGHT HEART CATHETERIZATION WITH CORONARY ANGIOGRAM N/A 04/25/2012  Procedure: LEFT AND RIGHT HEART CATHETERIZATION WITH CORONARY ANGIOGRAM;  Surgeon: Marykay Lex, MD;  Location: [facility] CATH LAB;  Service: Cardiovascular;  Laterality: N/A;   LEFT HEART CATH AND CORONARY ANGIOGRAPHY Left 11/25/2022   Procedure: LEFT HEART CATH AND CORONARY ANGIOGRAPHY;  Surgeon: Iran Ouch, MD;  Location: [facility] INVASIVE CV LAB;  Service: Cardiovascular;  Laterality: Left;   LEFT HEART CATHETERIZATION WITH CORONARY/GRAFT ANGIOGRAM  07/29/2013   Procedure: LEFT HEART CATHETERIZATION WITH Isabel Caprice;  Surgeon: Micheline Chapman, MD;  Location: [facility] CATH LAB;  Service: Cardiovascular; ; 95% stenosis with percent mid occlusion of SVG-RPDA; 90% anastomotic SVG-diagonal, moderate 50% disease in the native circumflex with patent OM 1 OM 2. 80-90% proximal LAD with patent LIMA to LAD , EF 50%   MOHS SURGERY     FOREHEAD   NM MYOVIEW LTD  Jan 2015   Moderate Inferior infarct (as expected), but no ischemia & EF ~45-50%   PERCUTANEOUS CORONARY STENT INTERVENTION (PCI-S)  07/29/2013   Procedure: PERCUTANEOUS CORONARY STENT INTERVENTION (PCI-S);  Surgeon: Micheline Chapman, MD;  Location: [facility] CATH LAB;  Service: Cardiovascular;; PCI to SVG-RPDA: Promus DES - 3 mm x 38 mm, 3 mm x 38 mm, 3 mm x 20 mm, 2.75 mm x 38 mm -- all postdilated to ~3.3 mm;   PERCUTANEOUS CORONARY STENT INTERVENTION (PCI-S) N/A 08/01/2013   Procedure: PERCUTANEOUS CORONARY STENT INTERVENTION (PCI-S);  Surgeon: Marykay Lex, MD;  Location: [facility] CATH LAB;  Service: Cardiovascular;staged PCI to distal SVG-diagonal: Xience Xpedition 2.5 mm x 20 mm (2.66 mm)     TRANSTHORACIC ECHOCARDIOGRAM  12/'13; 07/30/2013   a) Post CABG: EF 50-55%, moderate HK of anteroseptal wall, septal dyskinesis/dyssynergy due to poststernotomy;  grade 1 diastolic dysfunction. Mildly dilated LA, mildly reduced RV function-likely due to septal dyssynergy.; b) 11/'14 Post Inferior STEMI: EF 50-55% mild LVH. Mild hypokinesis of the distal lateral, and basal to mid inferior lateral and inferior walls -- consistent with RCA infarct    Prior to Admission medications   Medication Sig Start Date End Date Taking? Authorizing Provider  acetaminophen (TYLENOL) 325 MG tablet Take 2 tablets (650 mg total) by mouth every 4 (four) hours as needed for headache or mild pain. 08/02/13  Yes Kilroy, Eda Paschal, PA-C  Ascorbic Acid (VITAMIN C) 1000 MG tablet Take 1,000 mg by mouth daily.   Yes [provider]  aspirin 81 MG tablet Take 81 mg by mouth daily.   Yes [provider]  atorvastatin  (LIPITOR) 20 MG tablet Take 1 tablet (20 mg total) by mouth daily. 05/20/23  Yes Iran Ouch, MD  carvedilol (COREG) 6.25 MG tablet Take 1 tablet (6.25 mg total) by mouth 2 (two) times daily. Patient taking differently: Take 6.25 mg by mouth 2 (two) times daily. 6.25 mg AM, 12.5 mg PM 10/07/22  Yes Iran Ouch, MD  cetirizine (ZYRTEC) 10 MG tablet Take 10 mg by mouth daily as needed for allergies. PM   Yes [provider]  clopidogrel (PLAVIX) 75 MG tablet TAKE 1 TABLET (75 MG TOTAL) BY MOUTH DAILY WITH BREAKFAST. 01/12/20  Yes Iran Ouch, MD  ezetimibe (ZETIA) 10 MG tablet Take 1 tablet (10 mg total) by mouth daily. 08/09/19 07/15/23 Yes Iran Ouch, MD  ketoconazole (NIZORAL) 2 % cream Apply 1 Application topically 2 (two) times daily. 10/22/22  Yes Willeen Niece, MD  pantoprazole (PROTONIX) 40 MG tablet Take 1 tablet (40 mg total) by mouth daily at 6 (  six) AM. 08/02/13  Yes Kilroy, Eda Paschal, PA-C  ranolazine (RANEXA) 500 MG 12 hr tablet Take 500 mg by mouth 2 (two) times daily.   Yes [provider]  sildenafil (VIAGRA) 100 MG tablet Take by mouth. 09/19/20  Yes [provider]  traZODone (DESYREL) 50 MG tablet Take 25 mg by mouth at bedtime.   Yes [provider]  valsartan (DIOVAN) 320 MG tablet TAKE 1 TABLET BY MOUTH EVERY DAY 09/03/21  Yes Iran Ouch, MD  hydrocortisone 2.5 % cream Apply topically 2 (two) times daily as needed (Rash). Use up to 2 weeks Patient not taking: Reported on 07/15/2023 10/22/22   Willeen Niece, MD  NITROSTAT 0.4 MG SL tablet PLACE 1 TABLET UNDER THE TONGUE EVERY 5 MINUTES X 3 DOSES AS NEEDED FOR CHEST PAIN. 11/18/22   Iran Ouch, MD    Allergies as of 06/29/2023 - Review Complete 03/12/2023  Allergen Reaction Noted   Norvasc [amlodipine besylate]  08/02/2013   Ace inhibitors Other (See Comments) 04/30/2012   Crestor [rosuvastatin] Other (See Comments) 11/06/2022    Family History  Problem Relation  Age of Onset   Alzheimer's disease Mother    Heart failure Father    Coronary artery disease Father    Valvular heart disease Sister     Social History   Socioeconomic History   Marital status: Married    Spouse name: Not on file   Number of children: Not on file   Years of education: Not on file   Highest education level: Not on file  Occupational History   Not on file  Tobacco Use   Smoking status: Former    Current packs/day: 0.00    Average packs/day: 1 pack/day for 10.0 years (10.0 ttl pk-yrs)    Types: Cigarettes    Start date: 09/01/1966    Quit date: 09/01/1976    Years since quitting: 46.9   Smokeless tobacco: Never  Vaping Use   Vaping status: Never Used  Substance and Sexual Activity   Alcohol use: Yes    Alcohol/week: 10.0 standard drinks of alcohol    Types: 10 Shots of liquor per week    Comment: 1-2 shots of jack daniels a day.   Drug use: No   Sexual activity: Yes  Other Topics Concern   Not on file  Social History Narrative   He is a married father of 3, grandfather and 2.   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   He completed cardiac rehabilitation but as Nedra Hai got out of the habit of exercising.   Social Determinants of Health   Financial Resource Strain: Low Risk  (02/25/2023)   Overall Financial Resource Strain (CARDIA)    Difficulty of Paying Living Expenses: Not hard at all  Food Insecurity: No Food Insecurity (02/25/2023)   Hunger Vital Sign    Worried About Running Out of Food in the Last Year: Never true    Ran Out of Food in the Last Year: Never true  Transportation Needs: No Transportation Needs (02/25/2023)   PRAPARE - Administrator, Civil Service (Medical): No    Lack of Transportation (Non-Medical): No   Physical Activity: Insufficiently Active (02/25/2023)   Exercise Vital Sign    Days  of Exercise per Week: 2 days    Minutes of Exercise per Session: 20 min  Stress: No Stress Concern Present (02/25/2023)   [employer] of Occupational Health - Occupational Stress Questionnaire    Feeling of Stress : Not at all  Social Connections: Socially Integrated (02/25/2023)   Social Connection and Isolation Panel [NHANES]    Frequency of Communication with Friends and Family: More than three times a week    Frequency of Social Gatherings with Friends and Family: More than three times a week    Attends Religious Services: More than 4 times per year    Active Member of [employer] or Organizations: Yes    Attends Banker Meetings: Never    Marital Status: Married  Catering manager Violence: Not At Risk (02/25/2023)   Humiliation, Afraid, Rape, and Kick questionnaire    Fear of Current or Ex-Partner: No    Emotionally Abused: No    Physically Abused: No    Sexually Abused: No    Review of Systems: See HPI, otherwise negative ROS  Physical Exam: BP (!) 150/91   Temp 97.6 F (36.4 C) (Temporal)   Resp 14   Ht 5' 9.02" (1.753 m)   Wt 101.6 kg   SpO2 99%   BMI 33.05 kg/m  General:   Alert, cooperative in NAD Head:  Normocephalic and atraumatic. Respiratory:  Normal work of breathing. Cardiovascular:  RRR  Impression/Plan: [name] is here for cataract surgery.  Risks, benefits, limitations, and alternatives regarding cataract surgery have been reviewed with the patient.  Questions have been answered.  All parties agreeable.   Galen Manila, MD  07/21/2023, 8:40 AM

## 2023-07-22 ENCOUNTER — Encounter: Payer: Self-pay | Admitting: Ophthalmology

## 2023-07-22 DIAGNOSIS — H2512 Age-related nuclear cataract, left eye: Secondary | ICD-10-CM | POA: Diagnosis not present

## 2023-07-29 NOTE — Discharge Instructions (Signed)

## 2023-08-03 ENCOUNTER — Ambulatory Visit (INDEPENDENT_AMBULATORY_CARE_PROVIDER_SITE_OTHER): Payer: Medicare HMO | Admitting: Internal Medicine

## 2023-08-03 ENCOUNTER — Encounter: Payer: Self-pay | Admitting: Internal Medicine

## 2023-08-03 ENCOUNTER — Ambulatory Visit: Payer: Self-pay

## 2023-08-03 VITALS — BP 126/78 | HR 51 | Temp 98.4°F | Ht 69.02 in | Wt 226.0 lb

## 2023-08-03 DIAGNOSIS — J40 Bronchitis, not specified as acute or chronic: Secondary | ICD-10-CM | POA: Diagnosis not present

## 2023-08-03 MED ORDER — DOXYCYCLINE HYCLATE 100 MG PO TABS: 100.0000 mg | ORAL_TABLET | Freq: Two times a day (BID) | ORAL | 0 refills | Status: AC

## 2023-08-03 MED ORDER — PROMETHAZINE-DM 6.25-15 MG/5ML PO SYRP: 5.0000 mL | ORAL_SOLUTION | Freq: Four times a day (QID) | ORAL | 0 refills | Status: AC | PRN

## 2023-08-03 NOTE — Telephone Encounter (Signed)
     Chief Complaint: Productive cough with green mucus, sinus drainage, SOB with exertion, fatigue Symptoms: Above Frequency: 1 week ago Pertinent Negatives: Patient denies fever Disposition: [] ED /[] Urgent Care (no appt availability in office) / [x] Appointment(In office/virtual)/ []  [facility]/ [] Home Care/ [] Refused Recommended Disposition /[] [facility]/ []  Follow-up with PCP Additional Notes: Agrees with appointment.  Reason for Disposition  [1] MILD difficulty breathing (e.g., minimal/no SOB at rest, SOB with walking, pulse <100) AND [2] still present when not coughing  Answer Assessment - Initial Assessment Questions 1. ONSET: "When did the cough begin?"      1 week ago 2. SEVERITY: "How bad is the cough today?"      Severe 3. SPUTUM: "Describe the color of your sputum" (none, dry cough; clear, white, yellow, green)     Green-yellow 4. HEMOPTYSIS: "Are you coughing up any blood?" If so ask: "How much?" (flecks, streaks, tablespoons, etc.)     No 5. DIFFICULTY BREATHING: "Are you having difficulty breathing?" If Yes, ask: "How bad is it?" (e.g., mild, moderate, severe)    - MILD: No SOB at rest, mild SOB with walking, speaks normally in sentences, can lie down, no retractions, pulse < 100.    - MODERATE: SOB at rest, SOB with minimal exertion and prefers to sit, cannot lie down flat, speaks in phrases, mild retractions, audible wheezing, pulse 100-120.    - SEVERE: Very SOB at rest, speaks in single words, struggling to breathe, sitting hunched forward, retractions, pulse > 120      Mild 6. FEVER: "Do you have a fever?" If Yes, ask: "What is your temperature, how was it measured, and when did it start?"     No 7. CARDIAC HISTORY: "Do you have any history of heart disease?" (e.g., heart attack, congestive heart failure)      Yes 8. LUNG HISTORY: "Do you have any history of lung disease?"  (e.g., pulmonary embolus, asthma, emphysema)     No 9. PE RISK  FACTORS: "Do you have a history of blood clots?" (or: recent major surgery, recent prolonged travel, bedridden)     No 10. OTHER SYMPTOMS: "Do you have any other symptoms?" (e.g., runny nose, wheezing, chest pain)       Sinus, 11. PREGNANCY: "Is there any chance you are pregnant?" "When was your last menstrual period?"       N/a 12. TRAVEL: "Have you traveled out of the country in the last month?" (e.g., travel history, exposures)       No  Protocols used: Cough - Acute Productive-A-AH

## 2023-08-03 NOTE — Progress Notes (Signed)
Date:  08/03/2023   Name:  Jonathan Escobar   DOB:  May 14, 1946   MRN:  [id number]   Chief Complaint: Cough (Started a week ago. Cough- green production, sneezing, shortness of breath. )  Cough This is a new problem. The current episode started in the past 7 days. The problem has been gradually worsening. The problem occurs every few minutes. The cough is Productive of sputum. Associated symptoms include nasal congestion, postnasal drip and a sore throat. Pertinent negatives include no chest pain, chills, ear congestion, ear pain, fever, headaches or shortness of breath. He has tried OTC cough suppressant for the symptoms. The treatment provided mild relief.    Review of Systems  Constitutional:  Positive for fatigue. Negative for chills and fever.  HENT:  Positive for postnasal drip and sore throat. Negative for ear pain and trouble swallowing.   Respiratory:  Positive for cough. Negative for chest tightness and shortness of breath.   Cardiovascular:  Negative for chest pain, palpitations and leg swelling.  Neurological:  Negative for dizziness, light-headedness and headaches.  Psychiatric/Behavioral:  Positive for sleep disturbance. Negative for dysphoric mood. The patient is not nervous/anxious.      Lab Results  Component Value Date   NA 137 11/06/2022   K 4.6 11/06/2022   CO2 21 11/06/2022   GLUCOSE 100 (H) 11/06/2022   BUN 11 11/06/2022   CREATININE 1.02 11/06/2022   CALCIUM 9.7 11/06/2022   EGFR 77 11/06/2022   GFRNONAA 74 06/18/2020   Lab Results  Component Value Date   CHOL 149 11/06/2022   HDL 46 11/06/2022   LDLCALC 74 11/06/2022   TRIG 169 (H) 11/06/2022   CHOLHDL 3.2 11/06/2022   Lab Results  Component Value Date   TSH 1.300 06/18/2020   Lab Results  Component Value Date   HGBA1C 5.9 (H) 11/06/2022   Lab Results  Component Value Date   WBC 5.6 11/06/2022   HGB 14.2 11/06/2022   HCT 41.3 11/06/2022   MCV 94 11/06/2022   PLT 255 11/06/2022   Lab  Results  Component Value Date   ALT 31 11/06/2022   AST 30 11/06/2022   ALKPHOS 87 11/06/2022   BILITOT 0.6 11/06/2022   No results found for: "25OHVITD2", "25OHVITD3", "VD25OH"   Patient Active Problem List   Diagnosis Date Noted   Abnormal stress test 11/25/2022   Prediabetes 11/06/2022   Acquired thrombophilia (HCC) 11/06/2022   Tinnitus 08/12/2021   Trigger thumb, left thumb 08/12/2021   Benign essential microscopic hematuria 03/12/2020   Pain in left knee 08/23/2019   Atherosclerosis of aorta (HCC) 03/07/2019   GERD (gastroesophageal reflux disease) 01/19/2018   Personal history of other malignant neoplasm of skin 10/08/2015   Essential hypertension 07/19/2014   Fatigue 01/05/2014   Heart palpitations 09/10/2013   Obesity (BMI 30-39.9) 08/09/2013   CAD (coronary artery disease), autologous vein bypass graft --> full metal jacket PCI of SVG-RCA; PCI of SVG-D1 anastomosis 07/02/2013   Hyperlipidemia LDL goal <70    S/P CABG x 4, 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 (HCC) 04/07/2012    Allergies  Allergen Reactions   Norvasc [Amlodipine Besylate]     Dizzy   Ace Inhibitors Other (See Comments)    Cough    Crestor [Rosuvastatin] Other (See Comments)    Muscle pain    Past  Surgical History:  Procedure Laterality Date   CARDIAC CATHETERIZATION  04/28/12   RCA: 95% ISR followed by 100% stenosis post-stent with significant RPL lesions; LAD: Tandem 80 and 70% lesions involving D1 (1, 1, 1) --> initial angioplasty to RCA aspiration thrombectomy --> urgent CABG;   CATARACT EXTRACTION W/PHACO Right 07/21/2023   Procedure: CATARACT EXTRACTION PHACO AND INTRAOCULAR LENS PLACEMENT (IOC) RIGHT  CLAREON VIVITY TORIC  6.70  00:40.5;  Surgeon: Galen Manila, MD;  Location: [facility];  Service: Ophthalmology;  Laterality:  Right;   CORONARY ARTERY BYPASS GRAFT  04/28/2012   Procedure: CORONARY ARTERY BYPASS GRAFTING (CABG);  Surgeon: Alleen Borne, MD;  Location: [facility] OR;  Service: Open Heart Surgery;  Laterality: N/A;   HERNIA REPAIR Left    INGUINAL HERNIA REPAIR Right 08/06/2016   Procedure: HERNIA REPAIR INGUINAL ADULT;  Surgeon: Ricarda Frame, MD;  Location: [facility];  Service: General;  Laterality: Right;   LEFT AND RIGHT HEART CATHETERIZATION WITH CORONARY ANGIOGRAM N/A 04/25/2012   Procedure: LEFT AND RIGHT HEART CATHETERIZATION WITH CORONARY ANGIOGRAM;  Surgeon: Marykay Lex, MD;  Location: [facility] CATH LAB;  Service: Cardiovascular;  Laterality: N/A;   LEFT HEART CATH AND CORONARY ANGIOGRAPHY Left 11/25/2022   Procedure: LEFT HEART CATH AND CORONARY ANGIOGRAPHY;  Surgeon: Iran Ouch, MD;  Location: [facility] INVASIVE CV LAB;  Service: Cardiovascular;  Laterality: Left;   LEFT HEART CATHETERIZATION WITH CORONARY/GRAFT ANGIOGRAM  07/29/2013   Procedure: LEFT HEART CATHETERIZATION WITH Isabel Caprice;  Surgeon: Micheline Chapman, MD;  Location: [facility] CATH LAB;  Service: Cardiovascular; ; 95% stenosis with percent mid occlusion of SVG-RPDA; 90% anastomotic SVG-diagonal, moderate 50% disease in the native circumflex with patent OM 1 OM 2. 80-90% proximal LAD with patent LIMA to LAD , EF 50%   MOHS SURGERY     FOREHEAD   NM MYOVIEW LTD  Jan 2015   Moderate Inferior infarct (as expected), but no ischemia & EF ~45-50%   PERCUTANEOUS CORONARY STENT INTERVENTION (PCI-S)  07/29/2013   Procedure: PERCUTANEOUS CORONARY STENT INTERVENTION (PCI-S);  Surgeon: Micheline Chapman, MD;  Location: [facility] CATH LAB;  Service: Cardiovascular;; PCI to SVG-RPDA: Promus DES - 3 mm x 38 mm, 3 mm x 38 mm, 3 mm x 20 mm, 2.75 mm x 38 mm -- all postdilated to ~3.3 mm;   PERCUTANEOUS CORONARY STENT INTERVENTION (PCI-S) N/A 08/01/2013   Procedure: PERCUTANEOUS CORONARY STENT INTERVENTION (PCI-S);  Surgeon: Marykay Lex, MD;   Location: [facility] CATH LAB;  Service: Cardiovascular;staged PCI to distal SVG-diagonal: Xience Xpedition 2.5 mm x 20 mm (2.66 mm)     TRANSTHORACIC ECHOCARDIOGRAM  12/'13; 07/30/2013   a) Post CABG: EF 50-55%, moderate HK of anteroseptal wall, septal dyskinesis/dyssynergy due to poststernotomy;  grade 1 diastolic dysfunction. Mildly dilated LA, mildly reduced RV function-likely due to septal dyssynergy.; b) 11/'14 Post Inferior STEMI: EF 50-55% mild LVH. Mild hypokinesis of the distal lateral, and basal to mid inferior lateral and inferior walls -- consistent with RCA infarct    Social History   Tobacco Use   Smoking status: Former    Current packs/day: 0.00    Average packs/day: 1 pack/day for 10.0 years (10.0 ttl pk-yrs)    Types: Cigarettes    Start date: 09/01/1966    Quit date: 09/01/1976    Years since quitting: 46.9   Smokeless tobacco: Never  Vaping Use   Vaping status: Never Used  Substance Use Topics   Alcohol use: Yes    Alcohol/week:  10.0 standard drinks of alcohol    Types: 10 Shots of liquor per week    Comment: 1-2 shots of jack daniels a day.   Drug use: No     Medication list has been reviewed and updated.  Current Meds  Medication Sig   acetaminophen (TYLENOL) 325 MG tablet Take 2 tablets (650 mg total) by mouth every 4 (four) hours as needed for headache or mild pain.   Ascorbic Acid (VITAMIN C) 1000 MG tablet Take 1,000 mg by mouth daily.   aspirin 81 MG tablet Take 81 mg by mouth daily.   atorvastatin (LIPITOR) 20 MG tablet Take 1 tablet (20 mg total) by mouth daily.   carvedilol (COREG) 6.25 MG tablet Take 1 tablet (6.25 mg total) by mouth 2 (two) times daily. (Patient taking differently: Take 6.25 mg by mouth 2 (two) times daily. 6.25 mg AM, 12.5 mg PM)   cetirizine (ZYRTEC) 10 MG tablet Take 10 mg by mouth daily as needed for allergies. PM   clopidogrel (PLAVIX) 75 MG tablet TAKE 1 TABLET (75 MG TOTAL) BY MOUTH DAILY WITH BREAKFAST.   doxycycline (VIBRA-TABS) 100  MG tablet Take 1 tablet (100 mg total) by mouth 2 (two) times daily for 10 days.   ketoconazole (NIZORAL) 2 % cream Apply 1 Application topically 2 (two) times daily.   NITROSTAT 0.4 MG SL tablet PLACE 1 TABLET UNDER THE TONGUE EVERY 5 MINUTES X 3 DOSES AS NEEDED FOR CHEST PAIN.   pantoprazole (PROTONIX) 40 MG tablet Take 1 tablet (40 mg total) by mouth daily at 6 (six) AM.   promethazine-dextromethorphan (PROMETHAZINE-DM) 6.25-15 MG/5ML syrup Take 5 mLs by mouth 4 (four) times daily as needed for up to 9 days for cough.   ranolazine (RANEXA) 500 MG 12 hr tablet Take 500 mg by mouth 2 (two) times daily.   sildenafil (VIAGRA) 100 MG tablet Take by mouth.   traZODone (DESYREL) 50 MG tablet Take 25 mg by mouth at bedtime.   valsartan (DIOVAN) 320 MG tablet TAKE 1 TABLET BY MOUTH EVERY DAY       08/03/2023   11:25 AM 01/12/2023   10:22 AM 11/06/2022   10:58 AM 08/12/2021   10:43 AM  GAD 7 : Generalized Anxiety Score  Nervous, Anxious, on Edge 0 0 0 0  Control/stop worrying 0 0 0 0  Worry too much - different things 0 0 0 0  Trouble relaxing 0 0 0 0  Restless 0 0 0 0  Easily annoyed or irritable 0 0 0 0  Afraid - awful might happen 0 0 0 0  Total GAD 7 Score 0 0 0 0  Anxiety Difficulty Not difficult at all Not difficult at all Not difficult at all Not difficult at all       08/03/2023   11:25 AM 02/25/2023   10:56 AM 02/25/2023   10:53 AM  Depression screen PHQ 2/9  Decreased Interest 0 0 0  Down, Depressed, Hopeless 0 0 0  PHQ - 2 Score 0 0 0  Altered sleeping 0 0 0  Tired, decreased energy 3 0 0  Change in appetite 0 0 0  Feeling bad or failure about yourself  0 0 0  Trouble concentrating 0 0 0  Moving slowly or fidgety/restless 0 0 0  Suicidal thoughts 0 0 0  PHQ-9 Score 3 0 0  Difficult doing work/chores Not difficult at all Not difficult at all Not difficult at all    BP Readings from Last  3 Encounters:  08/03/23 126/78  07/21/23 121/80  03/12/23 (!) 124/90    Physical  Exam Vitals and nursing note reviewed.  Constitutional:      General: He is not in acute distress.    Appearance: Normal appearance. He is well-developed.  HENT:     Head: Normocephalic and atraumatic.     Right Ear: No middle ear effusion. Tympanic membrane is not erythematous or retracted.     Left Ear:  No middle ear effusion. Tympanic membrane is not erythematous or retracted.     Nose:     Right Sinus: No maxillary sinus tenderness or frontal sinus tenderness.     Left Sinus: No maxillary sinus tenderness or frontal sinus tenderness.     Mouth/Throat:     Pharynx: Posterior oropharyngeal erythema and postnasal drip present. No oropharyngeal exudate.  Cardiovascular:     Rate and Rhythm: Normal rate and regular rhythm.  Pulmonary:     Effort: Pulmonary effort is normal. No respiratory distress.     Breath sounds: No wheezing or rhonchi.  Musculoskeletal:     Cervical back: Normal range of motion.  Lymphadenopathy:     Cervical: No cervical adenopathy.  Skin:    General: Skin is warm and dry.     Findings: No rash.  Neurological:     Mental Status: He is alert and oriented to person, place, and time.  Psychiatric:        Mood and Affect: Mood normal.        Behavior: Behavior normal.     Wt Readings from Last 3 Encounters:  08/03/23 226 lb (102.5 kg)  07/21/23 223 lb 14.4 oz (101.6 kg)  03/12/23 223 lb 8 oz (101.4 kg)    BP 126/78   Pulse (!) 51   Temp 98.4 F (36.9 C) (Oral)   Ht 5' 9.02" (1.753 m)   Wt 226 lb (102.5 kg)   SpO2 98%   BMI 33.36 kg/m   Assessment and Plan:  Problem List Items Addressed This Visit   None Visit Diagnoses     Bronchitis    -  Primary   continue fluids and rest. Robitussin during the day - prescription Rx at bedtime follow up if needed   Relevant Medications   doxycycline (VIBRA-TABS) 100 MG tablet   promethazine-dextromethorphan (PROMETHAZINE-DM) 6.25-15 MG/5ML syrup       No follow-ups on file.    Reubin Milan, MD [facility] [place]

## 2023-08-11 ENCOUNTER — Other Ambulatory Visit: Payer: Self-pay

## 2023-08-11 ENCOUNTER — Ambulatory Visit
Admission: RE | Admit: 2023-08-11 | Discharge: 2023-08-11 | Disposition: A | Discharge status: Home / Self Care | Payer: Medicare HMO | Attending: Ophthalmology | Admitting: Ophthalmology

## 2023-08-11 ENCOUNTER — Ambulatory Visit: Payer: Medicare HMO | Admitting: Anesthesiology

## 2023-08-11 ENCOUNTER — Encounter
Admission: RE | Disposition: A | Discharge status: Home / Self Care | Payer: Self-pay | Source: Home / Self Care | Attending: Ophthalmology

## 2023-08-11 DIAGNOSIS — K219 Gastro-esophageal reflux disease without esophagitis: Secondary | ICD-10-CM | POA: Diagnosis not present

## 2023-08-11 DIAGNOSIS — Z955 Presence of coronary angioplasty implant and graft: Secondary | ICD-10-CM | POA: Diagnosis not present

## 2023-08-11 DIAGNOSIS — Z87891 Personal history of nicotine dependence: Secondary | ICD-10-CM | POA: Insufficient documentation

## 2023-08-11 DIAGNOSIS — I2510 Atherosclerotic heart disease of native coronary artery without angina pectoris: Secondary | ICD-10-CM | POA: Diagnosis not present

## 2023-08-11 DIAGNOSIS — E785 Hyperlipidemia, unspecified: Secondary | ICD-10-CM | POA: Diagnosis not present

## 2023-08-11 DIAGNOSIS — I10 Essential (primary) hypertension: Secondary | ICD-10-CM | POA: Diagnosis not present

## 2023-08-11 DIAGNOSIS — I252 Old myocardial infarction: Secondary | ICD-10-CM | POA: Insufficient documentation

## 2023-08-11 DIAGNOSIS — I25119 Atherosclerotic heart disease of native coronary artery with unspecified angina pectoris: Secondary | ICD-10-CM | POA: Diagnosis not present

## 2023-08-11 DIAGNOSIS — H2512 Age-related nuclear cataract, left eye: Secondary | ICD-10-CM | POA: Insufficient documentation

## 2023-08-11 DIAGNOSIS — Z951 Presence of aortocoronary bypass graft: Secondary | ICD-10-CM | POA: Insufficient documentation

## 2023-08-11 HISTORY — PX: CATARACT EXTRACTION W/PHACO: SHX586

## 2023-08-11 SURGERY — PHACOEMULSIFICATION, CATARACT, WITH IOL INSERTION
Anesthesia: Monitor Anesthesia Care | Site: Eye | Laterality: Left

## 2023-08-11 MED ORDER — TETRACAINE HCL 0.5 % OP SOLN
OPHTHALMIC | Status: AC
  Filled 2023-08-11: qty 4

## 2023-08-11 MED ORDER — FENTANYL CITRATE (PF) 100 MCG/2ML IJ SOLN
INTRAMUSCULAR | Status: DC | PRN
  Administered 2023-08-11 (×2): 50 ug via INTRAVENOUS

## 2023-08-11 MED ORDER — SIGHTPATH DOSE#1 BSS IO SOLN
INTRAOCULAR | Status: DC | PRN
  Administered 2023-08-11: 1 mL via INTRAMUSCULAR

## 2023-08-11 MED ORDER — BRIMONIDINE TARTRATE-TIMOLOL 0.2-0.5 % OP SOLN
OPHTHALMIC | Status: DC | PRN
  Administered 2023-08-11: 1 [drp] via OPHTHALMIC

## 2023-08-11 MED ORDER — SIGHTPATH DOSE#1 BSS IO SOLN
INTRAOCULAR | Status: DC | PRN
  Administered 2023-08-11: 51 mL via OPHTHALMIC

## 2023-08-11 MED ORDER — SIGHTPATH DOSE#1 NA CHONDROIT SULF-NA HYALURON 40-17 MG/ML IO SOLN
INTRAOCULAR | Status: DC | PRN
  Administered 2023-08-11: 1 mL via INTRAOCULAR

## 2023-08-11 MED ORDER — TETRACAINE HCL 0.5 % OP SOLN
1.0000 [drp] | OPHTHALMIC | Status: DC | PRN
  Administered 2023-08-11 (×3): 1 [drp] via OPHTHALMIC

## 2023-08-11 MED ORDER — ARMC OPHTHALMIC DILATING DROPS
1.0000 | OPHTHALMIC | Status: DC | PRN
Start: 2023-08-11 — End: 2023-08-11
  Administered 2023-08-11 (×3): 1 via OPHTHALMIC

## 2023-08-11 MED ORDER — SIGHTPATH DOSE#1 BSS IO SOLN
INTRAOCULAR | Status: DC | PRN
  Administered 2023-08-11: 15 mL

## 2023-08-11 MED ORDER — FENTANYL CITRATE (PF) 100 MCG/2ML IJ SOLN
INTRAMUSCULAR | Status: AC
  Filled 2023-08-11: qty 2

## 2023-08-11 MED ORDER — MIDAZOLAM HCL 2 MG/2ML IJ SOLN
INTRAMUSCULAR | Status: DC | PRN
  Administered 2023-08-11: 2 mg via INTRAVENOUS

## 2023-08-11 MED ORDER — MIDAZOLAM HCL 2 MG/2ML IJ SOLN
INTRAMUSCULAR | Status: AC
  Filled 2023-08-11: qty 2

## 2023-08-11 MED ORDER — MOXIFLOXACIN HCL 0.5 % OP SOLN
OPHTHALMIC | Status: DC | PRN
  Administered 2023-08-11: .2 mL via OPHTHALMIC

## 2023-08-11 SURGICAL SUPPLY — 11 items
ANGLE REVERSE CUT SHRT 25GA (CUTTER) ×1
CATARACT SUITE SIGHTPATH (MISCELLANEOUS) ×1
CYSTOTOME ANGL RVRS SHRT 25G (CUTTER) ×1 IMPLANT
FEE CATARACT SUITE SIGHTPATH (MISCELLANEOUS) ×1 IMPLANT
GLOVE BIOGEL PI IND STRL 8 (GLOVE) ×1 IMPLANT
GLOVE SURG LX STRL 8.0 MICRO (GLOVE) ×1 IMPLANT
LENS CLRN VIVITY TORIC 3 22.0 ×1 IMPLANT
LENS IOL CLRN VT TRC 3 22.0 IMPLANT
NDL FILTER BLUNT 18X1 1/2 (NEEDLE) ×1 IMPLANT
NEEDLE FILTER BLUNT 18X1 1/2 (NEEDLE) ×1
SYR 3ML LL SCALE MARK (SYRINGE) ×1 IMPLANT

## 2023-08-11 NOTE — Transfer of Care (Signed)
Immediate Anesthesia Transfer of Care Note  Patient: Jonathan Escobar  Procedure(s) Performed: CATARACT EXTRACTION PHACO AND INTRAOCULAR LENS PLACEMENT (IOC) LEFT CLAREON VIVITY TORIC  5.16  00:38.5 (Left: Eye)  Patient Location: PACU  Anesthesia Type: MAC  Level of Consciousness: awake, alert  and patient cooperative  Airway and Oxygen Therapy: Patient Spontanous Breathing and Patient connected to supplemental oxygen  Post-op Assessment: Post-op Vital signs reviewed, Patient's Cardiovascular Status Stable, Respiratory Function Stable, Patent Airway and No signs of Nausea or vomiting  Post-op Vital Signs: Reviewed and stable  Complications: No notable events documented.

## 2023-08-11 NOTE — H&P (Signed)
[facility]   Primary Care Physician:  Reubin Milan, MD Ophthalmologist: Dr. Druscilla Brownie  Pre-Procedure History & Physical: HPI:  Jonathan Escobar is a 77 y.o. male here for cataract surgery.   Past Medical History:  Diagnosis Date   CAD (coronary artery disease), autologous vein bypass graft 07/2013   Inf STEMI - PCI to SVG-RPDA:    CAD in native artery - prior Inferior MI - RCA PCI; 04/2012 - Inferior STEMI - RCA occluded (unable to dilate lesion adequately) + LAD & D1 disease --> referred for CABG 04/07/2012   CAD S/P percutaneous coronary angioplasty 1999   BMS to prox RCA 1999; Brachytherapy ~2003, followed by DES  PCI ([facility]) for ISR.   Coronary stent restenosis due to progression of disease 2000; 2005   (While Still In [place]) Status post Brachytherapy to RCA ISR - 2000; Redo PCI with DES 2005   Diverticulitis    Essential hypertension    CONTROLLED ON MEDS   GERD (gastroesophageal reflux disease)    Grade I diastolic dysfunction    History of GI bleed     while on Plavix   History of viral meningitis    following GI bleed    History of: ST elevation myocardial infarction (STEMI) of inferior wall, subsequent episode of care 04/25/2012   RCA: 95% ISR followed by 100% stenosis post-stent with significant RPL lesions; LAD: Tandem 80 and 70% lesions involving D1 (1, 1, 1) --> initial angioplasty to RCA aspiration thrombectomy --> urgent CABG;   Hyperlipidemia LDL goal <70    Non-recurrent unilateral inguinal hernia without obstruction or gangrene    Obesity (BMI 30.0-34.9)    Plantar fasciitis    left heel   Right inguinal hernia 07/02/2016   S/P CABG x 4: LIMA-LAD, seq SVG-PD-PLA; SVG-diag. 04/28/2012   S/P coronary artery stent placement Nov-Dec 2014   a) Inf STEMIL 11/28/'14 --> extensive (full metal Jacket) of SVG-RPDA Promus DES - 3 mm x 38 mm, 3 mm x 38 mm, 3 mm x 20 mm, 2.75 mm x 38 mm -- all postdilated to ~3.3 mm;; b) 12/1/'14: staged PCI to distal  SVG-diagonal: Xience Xpedition 2.5 mm x 20 mm (2.66 mm)   ST elevation myocardial infarction (STEMI) of inferior wall, initial episode of care - s/p PTCA of 95% ISR to 70% and 100% distal stent occlusion to ~40% (8/25) 04/25/2012   ST elevation myocardial infarction (STEMI) of inferior wall, subsequent episode of care (HCC) 07/29/2013   Diffuse near occlusion of SVG-rPDA, 90% SVG-Diag   STEMI 07/29/13- successful complex full metal jacket stenting of the SVG-PDA/PLA -07/29/2013, staged SVG-Dx PCI 12/01 07/29/2013   Supraventricular tachycardia (HCC)    Trigger finger, left little finger     Past Surgical History:  Procedure Laterality Date   CARDIAC CATHETERIZATION  04/28/12   RCA: 95% ISR followed by 100% stenosis post-stent with significant RPL lesions; LAD: Tandem 80 and 70% lesions involving D1 (1, 1, 1) --> initial angioplasty to RCA aspiration thrombectomy --> urgent CABG;   CATARACT EXTRACTION W/PHACO Right 07/21/2023   Procedure: CATARACT EXTRACTION PHACO AND INTRAOCULAR LENS PLACEMENT (IOC) RIGHT  CLAREON VIVITY TORIC  6.70  00:40.5;  Surgeon: Galen Manila, MD;  Location: [facility];  Service: Ophthalmology;  Laterality: Right;   CORONARY ARTERY BYPASS GRAFT  04/28/2012   Procedure: CORONARY ARTERY BYPASS GRAFTING (CABG);  Surgeon: Alleen Borne, MD;  Location: [facility] OR;  Service: Open Heart Surgery;  Laterality: N/A;   HERNIA REPAIR Left  INGUINAL HERNIA REPAIR Right 08/06/2016   Procedure: HERNIA REPAIR INGUINAL ADULT;  Surgeon: Ricarda Frame, MD;  Location: [facility];  Service: General;  Laterality: Right;   LEFT AND RIGHT HEART CATHETERIZATION WITH CORONARY ANGIOGRAM N/A 04/25/2012   Procedure: LEFT AND RIGHT HEART CATHETERIZATION WITH CORONARY ANGIOGRAM;  Surgeon: Marykay Lex, MD;  Location: [facility] CATH LAB;  Service: Cardiovascular;  Laterality: N/A;   LEFT HEART CATH AND CORONARY ANGIOGRAPHY Left 11/25/2022   Procedure: LEFT HEART CATH AND CORONARY  ANGIOGRAPHY;  Surgeon: Iran Ouch, MD;  Location: [facility] INVASIVE CV LAB;  Service: Cardiovascular;  Laterality: Left;   LEFT HEART CATHETERIZATION WITH CORONARY/GRAFT ANGIOGRAM  07/29/2013   Procedure: LEFT HEART CATHETERIZATION WITH Isabel Caprice;  Surgeon: Micheline Chapman, MD;  Location: [facility] CATH LAB;  Service: Cardiovascular; ; 95% stenosis with percent mid occlusion of SVG-RPDA; 90% anastomotic SVG-diagonal, moderate 50% disease in the native circumflex with patent OM 1 OM 2. 80-90% proximal LAD with patent LIMA to LAD , EF 50%   MOHS SURGERY     FOREHEAD   NM MYOVIEW LTD  Jan 2015   Moderate Inferior infarct (as expected), but no ischemia & EF ~45-50%   PERCUTANEOUS CORONARY STENT INTERVENTION (PCI-S)  07/29/2013   Procedure: PERCUTANEOUS CORONARY STENT INTERVENTION (PCI-S);  Surgeon: Micheline Chapman, MD;  Location: [facility] CATH LAB;  Service: Cardiovascular;; PCI to SVG-RPDA: Promus DES - 3 mm x 38 mm, 3 mm x 38 mm, 3 mm x 20 mm, 2.75 mm x 38 mm -- all postdilated to ~3.3 mm;   PERCUTANEOUS CORONARY STENT INTERVENTION (PCI-S) N/A 08/01/2013   Procedure: PERCUTANEOUS CORONARY STENT INTERVENTION (PCI-S);  Surgeon: Marykay Lex, MD;  Location: [facility] CATH LAB;  Service: Cardiovascular;staged PCI to distal SVG-diagonal: Xience Xpedition 2.5 mm x 20 mm (2.66 mm)     TRANSTHORACIC ECHOCARDIOGRAM  12/'13; 07/30/2013   a) Post CABG: EF 50-55%, moderate HK of anteroseptal wall, septal dyskinesis/dyssynergy due to poststernotomy;  grade 1 diastolic dysfunction. Mildly dilated LA, mildly reduced RV function-likely due to septal dyssynergy.; b) 11/'14 Post Inferior STEMI: EF 50-55% mild LVH. Mild hypokinesis of the distal lateral, and basal to mid inferior lateral and inferior walls -- consistent with RCA infarct    Prior to Admission medications   Medication Sig Start Date End Date Taking? Authorizing Provider  acetaminophen (TYLENOL) 325 MG tablet Take 2 tablets (650 mg total) by mouth every  4 (four) hours as needed for headache or mild pain. 08/02/13  Yes Kilroy, Eda Paschal, PA-C  Ascorbic Acid (VITAMIN C) 1000 MG tablet Take 1,000 mg by mouth daily.   Yes [provider]  aspirin 81 MG tablet Take 81 mg by mouth daily.   Yes [provider]  atorvastatin (LIPITOR) 20 MG tablet Take 1 tablet (20 mg total) by mouth daily. 05/20/23  Yes Iran Ouch, MD  carvedilol (COREG) 6.25 MG tablet Take 1 tablet (6.25 mg total) by mouth 2 (two) times daily. Patient taking differently: Take 6.25 mg by mouth 2 (two) times daily. 6.25 mg AM, 12.5 mg PM 10/07/22  Yes Iran Ouch, MD  cetirizine (ZYRTEC) 10 MG tablet Take 10 mg by mouth daily as needed for allergies. PM   Yes [provider]  clopidogrel (PLAVIX) 75 MG tablet TAKE 1 TABLET (75 MG TOTAL) BY MOUTH DAILY WITH BREAKFAST. 01/12/20  Yes Iran Ouch, MD  doxycycline (VIBRA-TABS) 100 MG tablet Take 1 tablet (100 mg total) by mouth 2 (two) times  daily for 10 days. 08/03/23 08/13/23 Yes Reubin Milan, MD  ketoconazole (NIZORAL) 2 % cream Apply 1 Application topically 2 (two) times daily. 10/22/22  Yes Willeen Niece, MD  NITROSTAT 0.4 MG SL tablet PLACE 1 TABLET UNDER THE TONGUE EVERY 5 MINUTES X 3 DOSES AS NEEDED FOR CHEST PAIN. 11/18/22  Yes Iran Ouch, MD  pantoprazole (PROTONIX) 40 MG tablet Take 1 tablet (40 mg total) by mouth daily at 6 (six) AM. 08/02/13  Yes Kilroy, Eda Paschal, PA-C  promethazine-dextromethorphan (PROMETHAZINE-DM) 6.25-15 MG/5ML syrup Take 5 mLs by mouth 4 (four) times daily as needed for up to 9 days for cough. 08/03/23 08/12/23 Yes Reubin Milan, MD  ranolazine (RANEXA) 500 MG 12 hr tablet Take 500 mg by mouth 2 (two) times daily.   Yes [provider]  sildenafil (VIAGRA) 100 MG tablet Take by mouth. 09/19/20  Yes [provider]  traZODone (DESYREL) 50 MG tablet Take 25 mg by mouth at bedtime.   Yes [provider]  valsartan (DIOVAN) 320 MG tablet TAKE  1 TABLET BY MOUTH EVERY DAY 09/03/21  Yes Iran Ouch, MD  ezetimibe (ZETIA) 10 MG tablet Take 1 tablet (10 mg total) by mouth daily. 08/09/19 07/15/23  Iran Ouch, MD    Allergies as of 06/29/2023 - Review Complete 03/12/2023  Allergen Reaction Noted   Norvasc [amlodipine besylate]  08/02/2013   Ace inhibitors Other (See Comments) 04/30/2012   Crestor [rosuvastatin] Other (See Comments) 11/06/2022    Family History  Problem Relation Age of Onset   Alzheimer's disease Mother    Heart failure Father    Coronary artery disease Father    Valvular heart disease Sister     Social History   Socioeconomic History   Marital status: Married    Spouse name: Not on file   Number of children: Not on file   Years of education: Not on file   Highest education level: Not on file  Occupational History   Not on file  Tobacco Use   Smoking status: Former    Current packs/day: 0.00    Average packs/day: 1 pack/day for 10.0 years (10.0 ttl pk-yrs)    Types: Cigarettes    Start date: 09/01/1966    Quit date: 09/01/1976    Years since quitting: 46.9   Smokeless tobacco: Never  Vaping Use   Vaping status: Never Used  Substance and Sexual Activity   Alcohol use: Yes    Alcohol/week: 10.0 standard drinks of alcohol    Types: 10 Shots of liquor per week    Comment: 1-2 shots of jack daniels a day.   Drug use: No   Sexual activity: Yes  Other Topics Concern   Not on file  Social History Narrative   He is a married father of 3, grandfather and 2.   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   He completed cardiac rehabilitation but as Nedra Hai got out of the habit of exercising.   Social Determinants of Health   Financial Resource Strain: Low Risk  (02/25/2023)    Overall Financial Resource Strain (CARDIA)    Difficulty of Paying Living Expenses: Not hard at all  Food Insecurity: No Food Insecurity (02/25/2023)   Hunger Vital Sign  Worried About Programme researcher, broadcasting/film/video in the Last Year: Never true    Ran Out of Food in the Last Year: Never true  Transportation Needs: No Transportation Needs (02/25/2023)   PRAPARE - Administrator, Civil Service (Medical): No    Lack of Transportation (Non-Medical): No  Physical Activity: Insufficiently Active (02/25/2023)   Exercise Vital Sign    Days of Exercise per Week: 2 days    Minutes of Exercise per Session: 20 min  Stress: No Stress Concern Present (02/25/2023)   [employer] of Occupational Health - Occupational Stress Questionnaire    Feeling of Stress : Not at all  Social Connections: Socially Integrated (02/25/2023)   Social Connection and Isolation Panel [NHANES]    Frequency of Communication with Friends and Family: More than three times a week    Frequency of Social Gatherings with Friends and Family: More than three times a week    Attends Religious Services: More than 4 times per year    Active Member of [employer] or Organizations: Yes    Attends Banker Meetings: Never    Marital Status: Married  Catering manager Violence: Not At Risk (02/25/2023)   Humiliation, Afraid, Rape, and Kick questionnaire    Fear of Current or Ex-Partner: No    Emotionally Abused: No    Physically Abused: No    Sexually Abused: No    Review of Systems: See HPI, otherwise negative ROS  Physical Exam: BP (!) 147/91   Pulse (!) 55   Temp 98.2 F (36.8 C) (Temporal)   Ht 5' 9.02" (1.753 m)   Wt 99.8 kg   SpO2 96%   BMI 32.47 kg/m  General:   Alert, cooperative in NAD Head:  Normocephalic and atraumatic. Respiratory:  Normal work of breathing. Cardiovascular:  RRR  Impression/Plan: [name] is here for cataract surgery.  Risks, benefits, limitations, and alternatives  regarding cataract surgery have been reviewed with the patient.  Questions have been answered.  All parties agreeable.   Galen Manila, MD  08/11/2023, 10:00 AM

## 2023-08-11 NOTE — Anesthesia Preprocedure Evaluation (Addendum)
Anesthesia Evaluation  Patient identified by MRN, date of birth, ID band Patient awake    Reviewed: Allergy & Precautions, H&P , NPO status , Patient's Chart, lab work & pertinent test results  Airway Mallampati: III  TM Distance: >3 FB Neck ROM: Full    Dental no notable dental hx.    Pulmonary neg pulmonary ROS, former smoker   Pulmonary exam normal breath sounds clear to auscultation       Cardiovascular hypertension, + angina  + CAD and + Past MI  negative cardio ROS Normal cardiovascular exam Rhythm:Regular Rate:Normal     ST elevation myocardial infarction (STEMI) of inferior wall, initial episode of care - s/p PTCA of 95% ISR to 70% and 100% distal stent occlusion to ~40% (8/25)               STEMI 07/29/13- successful complex full metal jacket stenting of the SVG-PDA/PLA -07/29/2013, staged SVG-Dx PCI 12/01   08-30-21  1. Left ventricular ejection fraction, by estimation, is 55%. The left  ventricle has normal function. The left ventricle has no regional wall  motion abnormalities. Left ventricular diastolic parameters are consistent  with Grade I diastolic dysfunction  (impaired relaxation).   2. Left atrial size was mildly dilated.   3. The aortic valve is tricuspid. Aortic valve regurgitation is not  visualized.   4. Aortic dilatation noted. There is mild dilatation of the aortic root,  measuring 42 mm.    11-25-22   Prox LAD to Mid LAD lesion is 90% stenosed.   Mid LAD lesion is 100% stenosed.   2nd Diag lesion is 100% stenosed.   Prox RCA to Mid RCA lesion is 100% stenosed.   Ost RCA to Prox RCA lesion is 80% stenosed.   Origin lesion is 100% stenosed.   Prox Graft to Dist Graft lesion is 10% stenosed.   Origin to Prox Graft lesion is 40% stenosed.   RPAV-1 lesion is 60% stenosed.   RPAV-2 lesion is 90% stenosed.   SVG graft was visualized by angiography.   LIMA graft was visualized by angiography  and is normal in caliber.   The graft exhibits no disease.   The left ventricular systolic function is normal.   LV end diastolic pressure is moderately elevated.   The left ventricular ejection fraction is 50-55% by visual estimate.   1.  Significant underlying two-vessel coronary artery disease with patent LIMA to LAD, occluded SVG to diagonal and patent SVG to right PDA/PL. the native right coronary artery stent is chronically occluded for many years.  The SVG to right PDA/PL is a full metal jacket from the ostium all the way distally.  The stents are patent with minimal restenosis except in the proximal portion that has 40% eccentric restenosis.  There is also small vessel disease involving PL branches which might account for the ischemia on nuclear stress test. 2.  Low normal LV systolic function with an EF of 50% with moderately elevated left ventricular end-diastolic pressure at 22 mmHg. 3.  Avoid catheterization via the left radial artery in the future due to small vessel size and inability to advance the wire.  The procedure was done successfully via the right femoral artery.   Recommendations: Recommend maximizing antianginal therapy.  I elected to add Ranexa 500 mg twice daily. Given elevated LVEDP and low normal ejection fraction, consider switching ARB to Entresto.      CAD S/P percutaneous coronary angioplasty Coronary stent restenosis due to progression of disease S/P  CABG x 4: LIMA-LAD, seq SVG-PD-PLA; SVG-diag. CAD (coronary artery disease), autologous vein bypass graft S/P coronary artery stent placement Hyperlipidemia LDL goal <70 Obesity (BMI 78.2-95.9) History of GI bleed History of viral meningitis History of: ST elevation myocardial infarction (STEMI) of inferior wall, subsequent episode of care ST elevation myocardial infarction (STEMI) of inferior wall, subsequent episode of care [facility])  Essential hypertension Diverticulitis Supraventricular tachycardia  (HCC) STEMI 07/29/13- successful complex full metal jacket stenting of the SVG-PDA/PLA -07/29/2013, staged SVG-Dx PCI 12/01 CAD in native artery - prior Inferior MI - RCA PCI; 04/2012 - Inferior STEMI - RCA occluded (unable to dilate lesion adequately) + LAD & D1 disease --> referred for CABG   ST elevation myocardial infarction (STEMI) of inferior wall, initial episode of care - s/p PTCA of 95% ISR to 70% and 100% distal stent occlusion to ~40% (8/25) Grade I diastolic dysfunction       Neuro/Psych negative neurological ROS  negative psych ROS   GI/Hepatic negative GI ROS, Neg liver ROS,GERD  ,,  Endo/Other  negative endocrine ROS    Renal/GU negative Renal ROS  negative genitourinary   Musculoskeletal negative musculoskeletal ROS (+)    Abdominal   Peds negative pediatric ROS (+)  Hematology negative hematology ROS (+)   Anesthesia Other Findings GERD,   HTN Cardiac issues Previous cataract surgery Nov 2024  Reproductive/Obstetrics negative OB ROS                              Anesthesia Physical Anesthesia Plan  ASA: 3  Anesthesia Plan: MAC   Post-op Pain Management:    Induction: Intravenous  PONV Risk Score and Plan:   Airway Management Planned: Natural Airway and Nasal Cannula  Additional Equipment:   Intra-op Plan:   Post-operative Plan:   Informed Consent: I have reviewed the patients History and Physical, chart, labs and discussed the procedure including the risks, benefits and alternatives for the proposed anesthesia with the patient or authorized representative who has indicated his/her understanding and acceptance.     Dental Advisory Given  Plan Discussed with: Anesthesiologist, CRNA and Surgeon  Anesthesia Plan Comments: (Patient consented for risks of anesthesia including but not limited to:  - adverse reactions to medications - damage to eyes, teeth, lips or other oral mucosa - nerve damage due to  positioning  - sore throat or hoarseness - Damage to heart, brain, nerves, lungs, other parts of body or loss of life  Patient voiced understanding and assent.)         Anesthesia Quick Evaluation

## 2023-08-11 NOTE — Anesthesia Postprocedure Evaluation (Signed)
Anesthesia Post Note  Patient: Jonathan Escobar  Procedure(s) Performed: CATARACT EXTRACTION PHACO AND INTRAOCULAR LENS PLACEMENT (IOC) LEFT CLAREON VIVITY TORIC  5.16  00:38.5 (Left: Eye)  Patient location during evaluation: PACU Anesthesia Type: MAC Level of consciousness: awake and alert Pain management: pain level controlled Vital Signs Assessment: post-procedure vital signs reviewed and stable Respiratory status: spontaneous breathing, nonlabored ventilation, respiratory function stable and patient connected to nasal cannula oxygen Cardiovascular status: stable and blood pressure returned to baseline Postop Assessment: no apparent nausea or vomiting Anesthetic complications: no   No notable events documented.   Last Vitals:  Vitals:   08/11/23 1023 08/11/23 1028  BP: 101/72 119/77  Pulse: 64 63  Resp: 15 17  Temp: 36.5 C 36.5 C  SpO2: 95% 96%    Last Pain:  Vitals:   08/11/23 1028  TempSrc:   PainSc: 0-No pain                 Claudell Wohler C Jaimi Belle

## 2023-08-11 NOTE — Op Note (Signed)
PREOPERATIVE DIAGNOSIS:  Nuclear sclerotic cataract of the left eye.   POSTOPERATIVE DIAGNOSIS:  Nuclear sclerotic cataract of the left eye.   OPERATIVE PROCEDURE: Procedure(s): CATARACT EXTRACTION PHACO AND INTRAOCULAR LENS PLACEMENT (IOC) LEFT CLAREON VIVITY TORIC  5.16  00:38.5   SURGEON:  Galen Manila, MD.   ANESTHESIA: 1.      Managed anesthesia care. 2.     0.20ml os Shugarcaine was instilled following the paracentesis [email or web address]   COMPLICATIONS:  None.   TECHNIQUE:   Stop and chop    DESCRIPTION OF PROCEDURE:  The patient was examined and consented in the preoperative holding area where the aforementioned topical anesthesia was applied to the left eye.  The patient was brought back to the Operating Room where he was sat upright on the gurney and given a target to fixate upon while the eye was marked at the 3:00 and 9:00 position.  The patient was then reclined on the operating table.  The eye was prepped and draped in the usual sterile ophthalmic fashion and a lid speculum was placed. A paracentesis was created with the side port blade and the anterior chamber was filled with viscoelastic. A near clear corneal incision was performed with the steel keratome. A continuous curvilinear capsulorrhexis was performed with a cystotome followed by the capsulorrhexis forceps. Hydrodissection and hydrodelineation were carried out with BSS on a blunt cannula. The lens was removed in a stop and chop technique and the remaining cortical material was removed with the irrigation-aspiration handpiece. The eye was inflated with viscoelastic and the ZCT lens was placed in the eye and rotated to within a few degrees of the predetermined orientation.  The remaining viscoelastic was removed from the eye.  The Sinskey hook was used to rotate the toric lens into its final resting place at 167 degrees.  0.1 ml of Vigamox was placed in the anterior chamber. The eye was inflated to a physiologic pressure and  found to be watertight.  The eye was dressed with Vigamox and COmbigan. The patient was given protective glasses to wear throughout the day and a shield with which to sleep tonight. The patient was also given drops with which to begin a drop regimen today and will follow-up with me in one day. Implant Name Type Inv. Item Serial No. Manufacturer Lot No. LRB No. Used Action  LENS CLRN VIVITY TORIC 3 22.0 - [id number]  LENS CLRN VIVITY TORIC 3 22.0 [id number] [employer]  Left 1 Implanted   Procedure(s): CATARACT EXTRACTION PHACO AND INTRAOCULAR LENS PLACEMENT (IOC) LEFT CLAREON VIVITY TORIC  5.16  00:38.5 (Left)  Electronically signed: Galen Manila 12/10/202410:25 AM

## 2023-08-12 ENCOUNTER — Encounter: Payer: Self-pay | Admitting: Ophthalmology

## 2023-09-18 ENCOUNTER — Ambulatory Visit: Payer: Medicare HMO | Attending: Cardiovascular Disease | Admitting: Cardiovascular Disease

## 2023-09-18 ENCOUNTER — Encounter: Payer: Self-pay | Admitting: Cardiovascular Disease

## 2023-09-18 VITALS — BP 118/78 | HR 53 | Ht 69.0 in | Wt 227.8 lb

## 2023-09-18 DIAGNOSIS — E785 Hyperlipidemia, unspecified: Secondary | ICD-10-CM

## 2023-09-18 DIAGNOSIS — I25118 Atherosclerotic heart disease of native coronary artery with other forms of angina pectoris: Secondary | ICD-10-CM | POA: Diagnosis not present

## 2023-09-18 DIAGNOSIS — I10 Essential (primary) hypertension: Secondary | ICD-10-CM | POA: Diagnosis not present

## 2023-09-18 DIAGNOSIS — R002 Palpitations: Secondary | ICD-10-CM

## 2023-09-18 MED ORDER — NEXLIZET 180-10 MG PO TABS: 1.0000 | ORAL_TABLET | Freq: Every day | ORAL | 3 refills | Status: DC

## 2023-09-18 NOTE — Progress Notes (Signed)
Cardiology Office Note   Date:  09/18/2023   ID:  Jonathan Escobar 02-Jan-1946, MRN [id number]  PCP:  Jonathan Milan, MD  Cardiologist:   Jonathan Bears, MD   Chief Complaint  Patient presents with   Follow-up    Patient denies new or acute cardiac problems/concerns today.        History of Present Illness: Jonathan Escobar is a 78 y.o. male who presents for a follow up visit regarding coronary artery disease. He is a former CT surgical PA with extensive cardiac history:  Initial cardiac event was in 1999 with bare-metal stent to the proximal RCA -> had brachytherapy in 2000 and 2003 then redo PCI with a Taxus DES in 2005 for ISR. Inferior STEMI 04/25/2012 - 95% ISR followed by 5% stenosis post stent with lesions in the PL and PDA. Also noted 70% mid LAD; after aspiration thrombectomy, balloon angioplasty performed and he was sent for urgent CABG  (LIMA-LAD, SVG-PDA and PLA, SVG-Diag)  Inferior STEMI in November 2014 with occluded SVG-PDA --> extensive PCI (full metal jacket) of SVG followed by staged PCI of anastomotic SVG-diagonal lesion. No cardiac events since then.   He is known to have short runs of SVT and PACs.   He has known history of essential hypertension with poor tolerance to medications.  He tried small dose amlodipine and small dose hydrochlorothiazide but did not feel well with both medications.  He had worsening angina earlier this year. He underwent a treadmill nuclear stress test and was able to exercise for 6 minutes.  He had 1 mm of ST depression on EKG.  Nuclear imaging showed moderate size ischemia in the basal to mid inferolateral region.  Cardiac catheterization in March of 2024 showed patent LIMA to LAD, occluded SVG to diagonal and patent SVG to right PDA/PL.  The SVG to the RCA is known to be a full metal jacket from the ostium all the way distally.  Minimal restenosis was noted in the mid to distal area but there was 40% eccentric restenosis in  the proximal stent.  There was severe disease in the right posterior AV groove and PL branches as well.  I recommended medical therapy and started Ranexa 500 mg twice daily.  His symptoms improved significantly with Ranexa.  He has been doing very well with minimal chest pain at this time and no shortness of breath.  Past Medical History:  Diagnosis Date   CAD (coronary artery disease), autologous vein bypass graft 07/2013   Inf STEMI - PCI to SVG-RPDA:    CAD in native artery - prior Inferior MI - RCA PCI; 04/2012 - Inferior STEMI - RCA occluded (unable to dilate lesion adequately) + LAD & D1 disease --> referred for CABG 04/07/2012   CAD S/P percutaneous coronary angioplasty 1999   BMS to prox RCA 1999; Brachytherapy ~2003, followed by DES  PCI (Taxus) for ISR.   Coronary stent restenosis due to progression of disease 2000; 2005   (While Still In [place]) Status post Brachytherapy to RCA ISR - 2000; Redo PCI with DES 2005   Diverticulitis    Essential hypertension    CONTROLLED ON MEDS   GERD (gastroesophageal reflux disease)    Grade I diastolic dysfunction    History of GI bleed     while on Plavix   History of viral meningitis    following GI bleed    History of: ST elevation myocardial infarction (STEMI) of inferior wall, subsequent episode  of care 04/25/2012   RCA: 95% ISR followed by 100% stenosis post-stent with significant RPL lesions; LAD: Tandem 80 and 70% lesions involving D1 (1, 1, 1) --> initial angioplasty to RCA aspiration thrombectomy --> urgent CABG;   Hyperlipidemia LDL goal <70    Non-recurrent unilateral inguinal hernia without obstruction or gangrene    Obesity (BMI 30.0-34.9)    Plantar fasciitis    left heel   Right inguinal hernia 07/02/2016   S/P CABG x 4: LIMA-LAD, seq SVG-PD-PLA; SVG-diag. 04/28/2012   S/P coronary artery stent placement Nov-Dec 2014   a) Inf STEMIL 11/28/'14 --> extensive (full metal Jacket) of SVG-RPDA Promus DES - 3 mm x 38 mm, 3 mm x 38  mm, 3 mm x 20 mm, 2.75 mm x 38 mm -- all postdilated to ~3.3 mm;; b) 12/1/'14: staged PCI to distal SVG-diagonal: Xience Xpedition 2.5 mm x 20 mm (2.66 mm)   ST elevation myocardial infarction (STEMI) of inferior wall, initial episode of care - s/p PTCA of 95% ISR to 70% and 100% distal stent occlusion to ~40% (8/25) 04/25/2012   ST elevation myocardial infarction (STEMI) of inferior wall, subsequent episode of care (HCC) 07/29/2013   Diffuse near occlusion of SVG-rPDA, 90% SVG-Diag   STEMI 07/29/13- successful complex full metal jacket stenting of the SVG-PDA/PLA -07/29/2013, staged SVG-Dx PCI 12/01 07/29/2013   Supraventricular tachycardia (HCC)    Trigger finger, left little finger     Past Surgical History:  Procedure Laterality Date   CARDIAC CATHETERIZATION  04/28/12   RCA: 95% ISR followed by 100% stenosis post-stent with significant RPL lesions; LAD: Tandem 80 and 70% lesions involving D1 (1, 1, 1) --> initial angioplasty to RCA aspiration thrombectomy --> urgent CABG;   CATARACT EXTRACTION W/PHACO Right 07/21/2023   Procedure: CATARACT EXTRACTION PHACO AND INTRAOCULAR LENS PLACEMENT (IOC) RIGHT  CLAREON VIVITY TORIC  6.70  00:40.5;  Surgeon: Galen Manila, MD;  Location: [facility];  Service: Ophthalmology;  Laterality: Right;   CATARACT EXTRACTION W/PHACO Left 08/11/2023   Procedure: CATARACT EXTRACTION PHACO AND INTRAOCULAR LENS PLACEMENT (IOC) LEFT CLAREON VIVITY TORIC  5.16  00:38.5;  Surgeon: Galen Manila, MD;  Location: [facility];  Service: Ophthalmology;  Laterality: Left;   CORONARY ARTERY BYPASS GRAFT  04/28/2012   Procedure: CORONARY ARTERY BYPASS GRAFTING (CABG);  Surgeon: Alleen Borne, MD;  Location: [facility] OR;  Service: Open Heart Surgery;  Laterality: N/A;   HERNIA REPAIR Left    INGUINAL HERNIA REPAIR Right 08/06/2016   Procedure: HERNIA REPAIR INGUINAL ADULT;  Surgeon: Ricarda Frame, MD;  Location: [facility];  Service: General;   Laterality: Right;   LEFT AND RIGHT HEART CATHETERIZATION WITH CORONARY ANGIOGRAM N/A 04/25/2012   Procedure: LEFT AND RIGHT HEART CATHETERIZATION WITH CORONARY ANGIOGRAM;  Surgeon: Marykay Lex, MD;  Location: [facility] CATH LAB;  Service: Cardiovascular;  Laterality: N/A;   LEFT HEART CATH AND CORONARY ANGIOGRAPHY Left 11/25/2022   Procedure: LEFT HEART CATH AND CORONARY ANGIOGRAPHY;  Surgeon: Iran Ouch, MD;  Location: [facility] INVASIVE CV LAB;  Service: Cardiovascular;  Laterality: Left;   LEFT HEART CATHETERIZATION WITH CORONARY/GRAFT ANGIOGRAM  07/29/2013   Procedure: LEFT HEART CATHETERIZATION WITH Isabel Caprice;  Surgeon: Micheline Chapman, MD;  Location: [facility] C[facility] LAB;  Service: Cardiovascular; ; 95% stenosis with percent mid occlusion of SVG-RPDA; 90% anastomotic SVG-diagonal, moderate 50% disease in the native circumflex with patent OM 1 OM 2. 80-90% proximal LAD with patent LIMA to LAD , EF 50%  MOHS SURGERY     FOREHEAD   NM MYOVIEW LTD  Jan 2015   Moderate Inferior infarct (as expected), but no ischemia & EF ~45-50%   PERCUTANEOUS CORONARY STENT INTERVENTION (PCI-S)  07/29/2013   Procedure: PERCUTANEOUS CORONARY STENT INTERVENTION (PCI-S);  Surgeon: Micheline Chapman, MD;  Location: [facility] CATH LAB;  Service: Cardiovascular;; PCI to SVG-RPDA: Promus DES - 3 mm x 38 mm, 3 mm x 38 mm, 3 mm x 20 mm, 2.75 mm x 38 mm -- all postdilated to ~3.3 mm;   PERCUTANEOUS CORONARY STENT INTERVENTION (PCI-S) N/A 08/01/2013   Procedure: PERCUTANEOUS CORONARY STENT INTERVENTION (PCI-S);  Surgeon: Marykay Lex, MD;  Location: [facility] CATH LAB;  Service: Cardiovascular;staged PCI to distal SVG-diagonal: Xience Xpedition 2.5 mm x 20 mm (2.66 mm)     TRANSTHORACIC ECHOCARDIOGRAM  12/'13; 07/30/2013   a) Post CABG: EF 50-55%, moderate HK of anteroseptal wall, septal dyskinesis/dyssynergy due to poststernotomy;  grade 1 diastolic dysfunction. Mildly dilated LA, mildly reduced RV function-likely due to septal  dyssynergy.; b) 11/'14 Post Inferior STEMI: EF 50-55% mild LVH. Mild hypokinesis of the distal lateral, and basal to mid inferior lateral and inferior walls -- consistent with RCA infarct     Current Outpatient Medications  Medication Sig Dispense Refill   acetaminophen (TYLENOL) 325 MG tablet Take 2 tablets (650 mg total) by mouth every 4 (four) hours as needed for headache or mild pain.     Ascorbic Acid (VITAMIN C) 1000 MG tablet Take 1,000 mg by mouth daily.     aspirin 81 MG tablet Take 81 mg by mouth daily.     atorvastatin (LIPITOR) 20 MG tablet Take 1 tablet (20 mg total) by mouth daily.     Bempedoic Acid-Ezetimibe (NEXLIZET) 180-10 MG TABS Take 1 tablet by mouth daily. 90 tablet 3   carvedilol (COREG) 6.25 MG tablet Take 6.25 mg by mouth 2 (two) times daily with a meal. 6.25 QAM, 12.5 mg QPM     cetirizine (ZYRTEC) 10 MG tablet Take 10 mg by mouth daily as needed for allergies. PM     clopidogrel (PLAVIX) 75 MG tablet TAKE 1 TABLET (75 MG TOTAL) BY MOUTH DAILY WITH BREAKFAST. 90 tablet 0   ketoconazole (NIZORAL) 2 % cream Apply 1 Application topically 2 (two) times daily. 60 g 11   NITROSTAT 0.4 MG SL tablet PLACE 1 TABLET UNDER THE TONGUE EVERY 5 MINUTES X 3 DOSES AS NEEDED FOR CHEST PAIN. 25 tablet 0   pantoprazole (PROTONIX) 40 MG tablet Take 1 tablet (40 mg total) by mouth daily at 6 (six) AM. 90 tablet 3   ranolazine (RANEXA) 500 MG 12 hr tablet Take 500 mg by mouth 2 (two) times daily.     tadalafil (CIALIS) 20 MG tablet Take 10 mg by mouth daily as needed for erectile dysfunction.     traZODone (DESYREL) 50 MG tablet Take 25 mg by mouth at bedtime.     valsartan (DIOVAN) 320 MG tablet TAKE 1 TABLET BY MOUTH EVERY DAY 90 tablet 2   No current facility-administered medications for this visit.    Allergies:   Norvasc [amlodipine besylate], Ace inhibitors, and Crestor [rosuvastatin]    Social History:  The patient  reports that he quit smoking about 47 years ago. His smoking  use included cigarettes. He started smoking about 57 years ago. He has a 10 pack-year smoking history. He has never used smokeless tobacco. He reports current alcohol use of about 10.0 standard drinks of alcohol  per week. He reports that he does not use drugs.   Family History:  The patient's family history includes Alzheimer's disease in his mother; Coronary artery disease in his father; Heart failure in his father; Valvular heart disease in his sister.    ROS:  Please see the history of present illness.   Otherwise, review of systems are positive for none.   All other systems are reviewed and negative.    PHYSICAL EXAM: VS:  BP 118/78 (BP Location: Left Arm, Patient Position: Sitting, Cuff Size: Large)   Pulse (!) 53   Ht 5\' 9"  (1.753 m)   Wt 227 lb 12.8 oz (103.3 kg)   SpO2 98%   BMI 33.64 kg/m  , BMI Body mass index is 33.64 kg/m. GEN: Well nourished, well developed, in no acute distress  HEENT: normal  Neck: no JVD,  or masses. Faint bilateral carotid bruits Cardiac: RRR; no murmurs, rubs, or gallops,no edema  Respiratory:  clear to auscultation bilaterally, normal work of breathing GI: soft, nontender, nondistended, + BS MS: no deformity or atrophy  Skin: warm and dry, no rash Neuro:  Strength and sensation are intact Psych: euthymic mood, full affect   EKG:  EKG is  ordered today. EKG showed:  Sinus bradycardia with Premature atrial complexes T wave abnormality, consider lateral ischemia When compared with ECG of 09-Dec-2018 13:21, Premature atrial complexes are now Present Vent. rate has decreased BY  47 BPM Left anterior fascicular block is no longer Present Nonspecific T wave abnormality, worse in Inferior leads T wave inversion now evident in Anterolateral leads     Recent Labs: 11/06/2022: ALT 31; BUN 11; Creatinine, Ser 1.02; Hemoglobin 14.2; Platelets 255; Potassium 4.6; Sodium 137    Lipid Panel    Component Value Date/Time   CHOL 149 11/06/2022 1127    CHOL 153 09/07/2012 1201   TRIG 169 (H) 11/06/2022 1127   TRIG 185 09/07/2012 1201   HDL 46 11/06/2022 1127   HDL 31 (L) 09/07/2012 1201   CHOLHDL 3.2 11/06/2022 1127   CHOLHDL 2.9 03/12/2020 0920   VLDL 20 03/12/2020 0920   VLDL 37 09/07/2012 1201   LDLCALC 74 11/06/2022 1127   LDLCALC 85 09/07/2012 1201      Wt Readings from Last 3 Encounters:  09/18/23 227 lb 12.8 oz (103.3 kg)  08/11/23 220 lb (99.8 kg)  08/03/23 226 lb (102.5 kg)          No data to display            ASSESSMENT AND PLAN:  1. Coronary artery disease involving bypass graft with stable class II angina: Continue long-term dual antiplatelet therapy. Significant improvement in angina with Ranexa which will be continued.  2. Palpitations: These were thought to be due to short runs of SVT as well as PACs.  His symptoms are reasonably controlled with carvedilol.     3. Hyperlipidemia: He did not tolerate high-dose rosuvastatin or high-dose atorvastatin.  He is tolerating atorvastatin 20 mg once daily and ezetimibe 10 mg daily.  Most recent lipid profile showed an LDL of 74.  We did discuss PCSK9 inhibitors but he is not interested in injection medications. I have recommended switching ezetimibe to Nexlizet for better lowering of LDL.  4. Essential hypertension: Blood pressure is controlled on current medications.   Disposition:   FU with me in 6 months  Signed,  Jonathan Bears, MD  09/18/2023 11:38 AM    [facility]

## 2023-09-18 NOTE — Patient Instructions (Signed)
Medication Instructions:  Your physician recommends the following medication changes.  STOP TAKING: Ezetimibe (Zetia)  START TAKING: Nexlizet 180-10 mg once daily    *If you need a refill on your cardiac medications before your next appointment, please call your pharmacy*   Lab Work: None ordered If you have labs (blood work) drawn today and your tests are completely normal, you will receive your results only by: MyChart Message (if you have MyChart) OR A paper copy in the mail If you have any lab test that is abnormal or we need to change your treatment, we will call you to review the results.   Testing/Procedures: None ordered   Follow-Up: At [facility], you and your health needs are our priority.  As part of our continuing mission to provide you with exceptional heart care, we have created designated Provider Care Teams.  These Care Teams include your primary Cardiologist (physician) and Advanced Practice Providers (APPs -  Physician Assistants and Nurse Practitioners) who all work together to provide you with the care you need, when you need it.  We recommend signing up for the patient portal called "MyChart".  Sign up information is provided on this After Visit Summary.  MyChart is used to connect with patients for Virtual Visits (Telemedicine).  Patients are able to view lab/test results, encounter notes, upcoming appointments, etc.  Non-urgent messages can be sent to your provider as well.   To learn more about what you can do with MyChart, go to [email or web address].    Your next appointment:   6 month(s)  Provider:   You may see Lorine Bears, MD or one of the following Advanced Practice Providers on your designated Care Team:   Nicolasa Ducking, NP Eula Listen, PA-C Cadence Fransico Michael, PA-C Charlsie Quest, NP Carlos Levering, NP

## 2023-11-09 ENCOUNTER — Ambulatory Visit (INDEPENDENT_AMBULATORY_CARE_PROVIDER_SITE_OTHER): Payer: Medicare HMO | Admitting: Internal Medicine

## 2023-11-09 ENCOUNTER — Encounter: Payer: Self-pay | Admitting: Internal Medicine

## 2023-11-09 VITALS — BP 122/72 | HR 49 | Ht 69.0 in | Wt 227.2 lb

## 2023-11-09 DIAGNOSIS — E785 Hyperlipidemia, unspecified: Secondary | ICD-10-CM

## 2023-11-09 DIAGNOSIS — I25708 Atherosclerosis of coronary artery bypass graft(s), unspecified, with other forms of angina pectoris: Secondary | ICD-10-CM

## 2023-11-09 DIAGNOSIS — Z125 Encounter for screening for malignant neoplasm of prostate: Secondary | ICD-10-CM

## 2023-11-09 DIAGNOSIS — Z0000 Encounter for general adult medical examination without abnormal findings: Secondary | ICD-10-CM

## 2023-11-09 DIAGNOSIS — I10 Essential (primary) hypertension: Secondary | ICD-10-CM

## 2023-11-09 DIAGNOSIS — Z860100 Personal history of colon polyps, unspecified: Secondary | ICD-10-CM | POA: Diagnosis not present

## 2023-11-09 DIAGNOSIS — R7303 Prediabetes: Secondary | ICD-10-CM

## 2023-11-09 DIAGNOSIS — K219 Gastro-esophageal reflux disease without esophagitis: Secondary | ICD-10-CM

## 2023-11-09 NOTE — Assessment & Plan Note (Signed)
 Controlled BP with normal exam. Current regimen is Coreg and Diovan. Will continue same medications; encourage continued reduced sodium diet.

## 2023-11-09 NOTE — Assessment & Plan Note (Signed)
Managed with diet only.

## 2023-11-09 NOTE — Assessment & Plan Note (Signed)
 He will call the [facility] to schedule

## 2023-11-09 NOTE — Progress Notes (Signed)
 Date:  11/09/2023   Name:  Jonathan Escobar   DOB:  October 05, 1945   MRN:  [id number]   Chief Complaint: Annual Exam Jonathan Escobar is a 78 y.o. male who presents today for his Complete Annual Exam. He feels well. He reports exercising none. He reports he is sleeping well.   Health Maintenance  Topic Date Due   Colon Cancer Screening  10/26/2023   COVID-19 Vaccine (3 - Pfizer risk series) 11/25/2023*   Zoster (Shingles) Vaccine (1 of 2) 02/09/2024*   Medicare Annual Wellness Visit  02/25/2024   DTaP/Tdap/Td vaccine (4 - Td or Tdap) 12/30/2024   Pneumonia Vaccine  Completed   Flu Shot  Completed   Hepatitis C Screening  Completed   HPV Vaccine  Aged Out  *Topic was postponed. The date shown is not the original due date.    Lab Results  Component Value Date   PSA1 3.4 11/06/2022   PSA1 3.9 08/12/2021   PSA1 2.4 03/07/2019     Hypertension This is a chronic problem. The problem is controlled. Pertinent negatives include no chest pain, headaches, palpitations or shortness of breath. Past treatments include angiotensin blockers and beta blockers. Hypertensive end-organ damage includes CAD/MI. There is no history of kidney disease or CVA.  Hyperlipidemia This is a chronic problem. Pertinent negatives include no chest pain or shortness of breath. Current antihyperlipidemic treatment includes statins (starting Praluent).  Diabetes He presents for his follow-up diabetic visit. Diabetes type: prediabetes. His disease course has been stable. Pertinent negatives for hypoglycemia include no dizziness or headaches. Pertinent negatives for diabetes include no chest pain, no fatigue and no weakness. Pertinent negatives for diabetic complications include no CVA.    Review of Systems  Constitutional:  Negative for fatigue and unexpected weight change.  HENT:  Negative for nosebleeds.   Eyes:  Negative for visual disturbance.  Respiratory:  Negative for cough, chest tightness, shortness of  breath and wheezing.   Cardiovascular:  Negative for chest pain, palpitations and leg swelling.  Gastrointestinal:  Negative for abdominal pain, constipation and diarrhea.  Neurological:  Negative for dizziness, weakness, light-headedness and headaches.     Lab Results  Component Value Date   NA 137 11/06/2022   K 4.6 11/06/2022   CO2 21 11/06/2022   GLUCOSE 100 (H) 11/06/2022   BUN 11 11/06/2022   CREATININE 1.02 11/06/2022   CALCIUM 9.7 11/06/2022   EGFR 77 11/06/2022   GFRNONAA 74 06/18/2020   Lab Results  Component Value Date   CHOL 149 11/06/2022   HDL 46 11/06/2022   LDLCALC 74 11/06/2022   TRIG 169 (H) 11/06/2022   CHOLHDL 3.2 11/06/2022   Lab Results  Component Value Date   TSH 1.300 06/18/2020   Lab Results  Component Value Date   HGBA1C 5.9 (H) 11/06/2022   Lab Results  Component Value Date   WBC 5.6 11/06/2022   HGB 14.2 11/06/2022   HCT 41.3 11/06/2022   MCV 94 11/06/2022   PLT 255 11/06/2022   Lab Results  Component Value Date   ALT 31 11/06/2022   AST 30 11/06/2022   ALKPHOS 87 11/06/2022   BILITOT 0.6 11/06/2022   No results found for: "25OHVITD2", "25OHVITD3", "VD25OH"   Patient Active Problem List   Diagnosis Date Noted   History of colonic polyps 11/09/2023   Abnormal stress test 11/25/2022   Prediabetes 11/06/2022   Acquired thrombophilia (HCC) 11/06/2022   Tinnitus 08/12/2021   Trigger thumb, left thumb 08/12/2021  Benign essential microscopic hematuria 03/12/2020   Pain in left knee 08/23/2019   Atherosclerosis of aorta (HCC) 03/07/2019   GERD (gastroesophageal reflux disease) 01/19/2018   Personal history of other malignant neoplasm of skin 10/08/2015   Essential hypertension 07/19/2014   Fatigue 01/05/2014   Heart palpitations 09/10/2013   Obesity (BMI 30-39.9) 08/09/2013   CAD (coronary artery disease), autologous vein bypass graft --> full metal jacket PCI of SVG-RCA; PCI of SVG-D1 anastomosis 07/02/2013   Hyperlipidemia  LDL goal <70    S/P CABG x 4, 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 (HCC) 04/07/2012    Allergies  Allergen Reactions   Norvasc [Amlodipine Besylate]     Dizzy   Ace Inhibitors Other (See Comments)    Cough    Crestor [Rosuvastatin] Other (See Comments)    Muscle pain    Past Surgical History:  Procedure Laterality Date   CARDIAC CATHETERIZATION  04/28/12   RCA: 95% ISR followed by 100% stenosis post-stent with significant RPL lesions; LAD: Tandem 80 and 70% lesions involving D1 (1, 1, 1) --> initial angioplasty to RCA aspiration thrombectomy --> urgent CABG;   CATARACT EXTRACTION W/PHACO Right 07/21/2023   Procedure: CATARACT EXTRACTION PHACO AND INTRAOCULAR LENS PLACEMENT (IOC) RIGHT  CLAREON VIVITY TORIC  6.70  00:40.5;  Surgeon: Galen Manila, MD;  Location: [facility];  Service: Ophthalmology;  Laterality: Right;   CATARACT EXTRACTION W/PHACO Left 08/11/2023   Procedure: CATARACT EXTRACTION PHACO AND INTRAOCULAR LENS PLACEMENT (IOC) LEFT CLAREON VIVITY TORIC  5.16  00:38.5;  Surgeon: Galen Manila, MD;  Location: [facility];  Service: Ophthalmology;  Laterality: Left;   CORONARY ARTERY BYPASS GRAFT  04/28/2012   Procedure: CORONARY ARTERY BYPASS GRAFTING (CABG);  Surgeon: Alleen Borne, MD;  Location[facility];  Service: Open Heart Surgery;  Laterality: N/A;   HERNIA REPAIR Left    INGUINAL HERNIA REPAIR Right 08/06/2016   Procedure: HERNIA REPAIR INGUINAL ADULT;  Surgeon: Ricarda Frame, MD;  Location: [facility];  Service: General;  Laterality: Right;   LEFT AND RIGHT HEART CATHETERIZATION WITH CORONARY ANGIOGRAM N/A 04/25/2012   Procedure: LEFT AND RIGHT HEART CATHETERIZATION WITH CORONARY ANGIOGRAM;  Surgeon: Marykay Lex, MD;  Location: [facility] CATH LAB;  Service: Cardiovascular;  Laterality: N/A;    LEFT HEART CATH AND CORONARY ANGIOGRAPHY Left 11/25/2022   Procedure: LEFT HEART CATH AND CORONARY ANGIOGRAPHY;  Surgeon: Iran Ouch, MD;  Location: [facility] INVASIVE CV LAB;  Service: Cardiovascular;  Laterality: Left;   LEFT HEART CATHETERIZATION WITH CORONARY/GRAFT ANGIOGRAM  07/29/2013   Procedure: LEFT HEART CATHETERIZATION WITH Isabel Caprice;  Surgeon: Micheline Chapman, MD;  Location: [facility];  Service: Cardiovascular; ; 95% stenosis with percent mid occlusion of SVG-RPDA; 90% anastomotic SVG-diagonal, moderate 50% disease in the native circumflex with patent OM 1 OM 2. 80-90% proximal LAD with patent LIMA to LAD , EF 50%   MOHS SURGERY     FOREHEAD   NM MYOVIEW LTD  Jan 2015   Moderate Inferior infarct (as expected), but no ischemia & EF ~45-50%   PERCUTANEOUS CORONARY STENT INTERVENTION (PCI-S)  07/29/2013   Procedure: PERCUTANEOUS CORONARY STENT INTERVENTION (PCI-S);  Surgeon: Micheline Chapman, MD;  Location: [facility] CATH LAB;  Service: Cardiovascular;; PCI to SVG-RPDA: Promus DES - 3 mm x 38 mm, 3 mm x 38 mm, 3 mm x  20 mm, 2.75 mm x 38 mm -- all postdilated to ~3.3 mm;   PERCUTANEOUS CORONARY STENT INTERVENTION (PCI-S) N/A 08/01/2013   Procedure: PERCUTANEOUS CORONARY STENT INTERVENTION (PCI-S);  Surgeon: Marykay Lex, MD;  Location: [facility] CATH LAB;  Service: Cardiovascular;staged PCI to distal SVG-diagonal: Xience Xpedition 2.5 mm x 20 mm (2.66 mm)     TRANSTHORACIC ECHOCARDIOGRAM  12/'13; 07/30/2013   a) Post CABG: EF 50-55%, moderate HK of anteroseptal wall, septal dyskinesis/dyssynergy due to poststernotomy;  grade 1 diastolic dysfunction. Mildly dilated LA, mildly reduced RV function-likely due to septal dyssynergy.; b) 11/'14 Post Inferior STEMI: EF 50-55% mild LVH. Mild hypokinesis of the distal lateral, and basal to mid inferior lateral and inferior walls -- consistent with RCA infarct    Social History   Tobacco Use   Smoking status: Former    Current packs/day: 0.00     Average packs/day: 1 pack/day for 10.0 years (10.0 ttl pk-yrs)    Types: Cigarettes    Start date: 09/01/1966    Quit date: 09/01/1976    Years since quitting: 47.2   Smokeless tobacco: Never  Vaping Use   Vaping status: Never Used  Substance Use Topics   Alcohol use: Yes    Alcohol/week: 10.0 standard drinks of alcohol    Types: 10 Shots of liquor per week    Comment: 1-2 shots of jack daniels a day.   Drug use: No     Medication list has been reviewed and updated.  Current Meds  Medication Sig   acetaminophen (TYLENOL) 325 MG tablet Take 2 tablets (650 mg total) by mouth every 4 (four) hours as needed for headache or mild pain.   Alirocumab (PRALUENT) 75 MG/ML SOAJ Inject 75 mg into the skin every 14 (fourteen) days.   Ascorbic Acid (VITAMIN C) 1000 MG tablet Take 1,000 mg by mouth daily.   aspirin 81 MG tablet Take 81 mg by mouth daily.   atorvastatin (LIPITOR) 20 MG tablet Take 1 tablet (20 mg total) by mouth daily.   carvedilol (COREG) 6.25 MG tablet Take 6.25 mg by mouth 2 (two) times daily with a meal. 6.25 QAM, 12.5 mg QPM   cetirizine (ZYRTEC) 10 MG tablet Take 10 mg by mouth daily as needed for allergies. PM   clopidogrel (PLAVIX) 75 MG tablet TAKE 1 TABLET (75 MG TOTAL) BY MOUTH DAILY WITH BREAKFAST.   ketoconazole (NIZORAL) 2 % cream Apply 1 Application topically 2 (two) times daily.   NITROSTAT 0.4 MG SL tablet PLACE 1 TABLET UNDER THE TONGUE EVERY 5 MINUTES X 3 DOSES AS NEEDED FOR CHEST PAIN.   pantoprazole (PROTONIX) 40 MG tablet Take 1 tablet (40 mg total) by mouth daily at 6 (six) AM.   ranolazine (RANEXA) 500 MG 12 hr tablet Take 500 mg by mouth 2 (two) times daily.   tadalafil (CIALIS) 20 MG tablet Take 10 mg by mouth daily as needed for erectile dysfunction.   traZODone (DESYREL) 50 MG tablet Take 25 mg by mouth at bedtime.   valsartan (DIOVAN) 320 MG tablet TAKE 1 TABLET BY MOUTH EVERY DAY       11/09/2023    9:34 AM 08/03/2023   11:25 AM 01/12/2023   10:22  AM 11/06/2022   10:58 AM  GAD 7 : Generalized Anxiety Score  Nervous, Anxious, on Edge 0 0 0 0  Control/stop worrying 0 0 0 0  Worry too much - different things 0 0 0 0  Trouble relaxing 0 0 0 0  Restless 0 0 0 0  Easily annoyed or irritable 0 0 0 0  Afraid - awful might happen 0 0 0 0  Total GAD 7 Score 0 0 0 0  Anxiety Difficulty  Not difficult at all Not difficult at all Not difficult at all       11/09/2023    9:33 AM 08/03/2023   11:25 AM 02/25/2023   10:56 AM  Depression screen PHQ 2/9  Decreased Interest 0 0 0  Down, Depressed, Hopeless 0 0 0  PHQ - 2 Score 0 0 0  Altered sleeping 0 0 0  Tired, decreased energy 0 3 0  Change in appetite 0 0 0  Feeling bad or failure about yourself  0 0 0  Trouble concentrating 0 0 0  Moving slowly or fidgety/restless 0 0 0  Suicidal thoughts 0 0 0  PHQ-9 Score 0 3 0  Difficult doing work/chores Not difficult at all Not difficult at all Not difficult at all    BP Readings from Last 3 Encounters:  11/09/23 122/72  09/18/23 118/78  08/11/23 119/77    Physical Exam Vitals and nursing note reviewed.  Constitutional:      Appearance: Normal appearance. He is well-developed.  HENT:     Head: Normocephalic.     Right Ear: Tympanic membrane, ear canal and external ear normal.     Left Ear: Tympanic membrane, ear canal and external ear normal.     Nose: Nose normal.  Eyes:     Conjunctiva/sclera: Conjunctivae normal.     Pupils: Pupils are equal, round, and reactive to light.  Neck:     Thyroid: No thyromegaly.     Vascular: No carotid bruit.  Cardiovascular:     Rate and Rhythm: Normal rate and regular rhythm.     Heart sounds: Normal heart sounds.  Pulmonary:     Effort: Pulmonary effort is normal.     Breath sounds: Normal breath sounds. No wheezing.  Chest:  Breasts:    Right: No mass.     Left: No mass.  Abdominal:     General: Bowel sounds are normal.     Palpations: Abdomen is soft.     Tenderness: There is no  abdominal tenderness.  Musculoskeletal:        General: Normal range of motion.     Cervical back: Normal range of motion and neck supple.     Right lower leg: No edema.     Left lower leg: No edema.  Lymphadenopathy:     Cervical: No cervical adenopathy.  Skin:    General: Skin is warm and dry.     Capillary Refill: Capillary refill takes less than 2 seconds.  Neurological:     Mental Status: He is alert and oriented to person, place, and time.     Deep Tendon Reflexes: Reflexes are normal and symmetric.  Psychiatric:        Attention and Perception: Attention normal.        Mood and Affect: Mood normal.        Thought Content: Thought content normal.     Wt Readings from Last 3 Encounters:  11/09/23 227 lb 4 oz (103.1 kg)  09/18/23 227 lb 12.8 oz (103.3 kg)  08/11/23 220 lb (99.8 kg)    BP 122/72   Pulse (!) 49   Ht 5\' 9"  (1.753 m)   Wt 227 lb 4 oz (103.1 kg)   SpO2 98%   BMI 33.56 kg/m  Assessment and Plan:  Problem List Items Addressed This Visit       Unprioritized   Hyperlipidemia LDL goal <70 (Chronic)   LDL is  Lab Results  Component Value Date   LDLCALC 74 11/06/2022   Current regimen is atorvastatin. Starting Praluent today.  Tolerating medications well without issues.       Relevant Medications   Alirocumab (PRALUENT) 75 MG/ML SOAJ   Essential hypertension (Chronic)   Controlled BP with normal exam. Current regimen is Coreg and Diovan. Will continue same medications; encourage continued reduced sodium diet.       Relevant Medications   Alirocumab (PRALUENT) 75 MG/ML SOAJ   Other Relevant Orders   CBC with Differential/Platelet   Comprehensive metabolic panel   TSH   Urinalysis, Routine w reflex microscopic   Coronary artery disease of bypass graft of native heart with stable angina pectoris (HCC)   Relevant Medications   Alirocumab (PRALUENT) 75 MG/ML SOAJ   GERD (gastroesophageal reflux disease)   Reflux symptoms are minimal on  current therapy - PPI daily. No red flag signs such as weight loss, n/v, melena       Prediabetes   Managed with diet only.      Relevant Orders   Hemoglobin A1c   History of colonic polyps   He will call the [facility] to schedule      Other Visit Diagnoses       Annual physical exam    -  Primary   up to date on screenings and immunizations     Prostate cancer screening       Relevant Orders   PSA       No follow-ups on file.    Reubin Milan, MD [facility] [place]

## 2023-11-09 NOTE — Assessment & Plan Note (Signed)
 Reflux symptoms are minimal on current therapy - PPI daily. No red flag signs such as weight loss, n/v, melena

## 2023-11-09 NOTE — Assessment & Plan Note (Addendum)
 LDL is  Lab Results  Component Value Date   LDLCALC 74 11/06/2022   Current regimen is atorvastatin. Starting Praluent today.  Tolerating medications well without issues.

## 2023-11-10 ENCOUNTER — Encounter: Payer: Self-pay | Admitting: Internal Medicine

## 2023-11-10 LAB — URINALYSIS, ROUTINE W REFLEX MICROSCOPIC
Bilirubin, UA: NEGATIVE
Glucose, UA: NEGATIVE
Ketones, UA: NEGATIVE
Leukocytes,UA: NEGATIVE
Nitrite, UA: NEGATIVE
Protein,UA: NEGATIVE
RBC, UA: NEGATIVE
Specific Gravity, UA: 1.014 (ref 1.005–1.030)
Urobilinogen, Ur: 0.2 mg/dL (ref 0.2–1.0)
pH, UA: 6.5 (ref 5.0–7.5)

## 2023-11-10 LAB — COMPREHENSIVE METABOLIC PANEL
ALT: 28 IU/L (ref 0–44)
AST: 25 IU/L (ref 0–40)
Albumin: 4.1 g/dL (ref 3.8–4.8)
Alkaline Phosphatase: 79 IU/L (ref 44–121)
BUN/Creatinine Ratio: 13 (ref 10–24)
BUN: 13 mg/dL (ref 8–27)
Bilirubin Total: 0.6 mg/dL (ref 0.0–1.2)
CO2: 23 mmol/L (ref 20–29)
Calcium: 9.4 mg/dL (ref 8.6–10.2)
Chloride: 99 mmol/L (ref 96–106)
Creatinine, Ser: 1.04 mg/dL (ref 0.76–1.27)
Globulin, Total: 2.8 g/dL (ref 1.5–4.5)
Glucose: 104 mg/dL — ABNORMAL HIGH (ref 70–99)
Potassium: 4.9 mmol/L (ref 3.5–5.2)
Sodium: 133 mmol/L — ABNORMAL LOW (ref 134–144)
Total Protein: 6.9 g/dL (ref 6.0–8.5)
eGFR: 74 mL/min/{1.73_m2} (ref >59)

## 2023-11-10 LAB — CBC WITH DIFFERENTIAL/PLATELET
Basophils Absolute: 0 10*3/uL (ref 0.0–0.2)
Basos: 1 %
EOS (ABSOLUTE): 0.2 10*3/uL (ref 0.0–0.4)
Eos: 4 %
Hematocrit: 39 % (ref 37.5–51.0)
Hemoglobin: 13.1 g/dL (ref 13.0–17.7)
Immature Grans (Abs): 0.1 10*3/uL (ref 0.0–0.1)
Immature Granulocytes: 1 %
Lymphocytes Absolute: 1 10*3/uL (ref 0.7–3.1)
Lymphs: 20 %
MCH: 31.6 pg (ref 26.6–33.0)
MCHC: 33.6 g/dL (ref 31.5–35.7)
MCV: 94 fL (ref 79–97)
Monocytes Absolute: 1 10*3/uL — ABNORMAL HIGH (ref 0.1–0.9)
Monocytes: 20 %
Neutrophils Absolute: 2.7 10*3/uL (ref 1.4–7.0)
Neutrophils: 54 %
Platelets: 224 10*3/uL (ref 150–450)
RBC: 4.14 x10E6/uL (ref 4.14–5.80)
RDW: 12.1 % (ref 11.6–15.4)
WBC: 5.1 10*3/uL (ref 3.4–10.8)

## 2023-11-10 LAB — PSA: Prostate Specific Ag, Serum: 2.9 ng/mL (ref 0.0–4.0)

## 2023-11-10 LAB — TSH: TSH: 2.19 u[IU]/mL (ref 0.450–4.500)

## 2023-11-10 LAB — HEMOGLOBIN A1C
Est. average glucose Bld gHb Est-mCnc: 123 mg/dL
Hgb A1c MFr Bld: 5.9 % — ABNORMAL HIGH (ref 4.8–5.6)

## 2023-12-09 ENCOUNTER — Encounter: Payer: Self-pay | Admitting: Dermatology

## 2023-12-09 ENCOUNTER — Ambulatory Visit: Admitting: Dermatology

## 2023-12-09 DIAGNOSIS — L578 Other skin changes due to chronic exposure to nonionizing radiation: Secondary | ICD-10-CM

## 2023-12-09 DIAGNOSIS — W908XXA Exposure to other nonionizing radiation, initial encounter: Secondary | ICD-10-CM | POA: Diagnosis not present

## 2023-12-09 DIAGNOSIS — Z5111 Encounter for antineoplastic chemotherapy: Secondary | ICD-10-CM

## 2023-12-09 DIAGNOSIS — L570 Actinic keratosis: Secondary | ICD-10-CM | POA: Diagnosis not present

## 2023-12-09 DIAGNOSIS — L820 Inflamed seborrheic keratosis: Secondary | ICD-10-CM | POA: Diagnosis not present

## 2023-12-09 DIAGNOSIS — Z85828 Personal history of other malignant neoplasm of skin: Secondary | ICD-10-CM

## 2023-12-09 DIAGNOSIS — Z7189 Other specified counseling: Secondary | ICD-10-CM

## 2023-12-09 DIAGNOSIS — Z79899 Other long term (current) drug therapy: Secondary | ICD-10-CM

## 2023-12-09 MED ORDER — FLUOROURACIL 5 % EX CREA: TOPICAL_CREAM | CUTANEOUS | 2 refills | Status: DC

## 2023-12-09 NOTE — Progress Notes (Signed)
 Follow-Up Visit   Subjective  Jonathan Escobar is a 79 y.o. male who presents for the following: Spots of concern on nose and scalp. Scaly, red. Raised, irritated. Dur: ~2 years. Lesion on nose has been treated in the past at [facility] clinic. Has used 5FU in the past on entire face, states he did not tolerated it well.   Hx of BCC. Mohs with Dr. Adriana Simas. Mid forehead.   The patient has spots, moles and lesions to be evaluated, some may be new or changing and the patient may have concern these could be cancer.    The following portions of the chart were reviewed this encounter and updated as appropriate: medications, allergies, medical history  Review of Systems:  No other skin or systemic complaints except as noted in HPI or Assessment and Plan.  Objective  Well appearing patient in no apparent distress; mood and affect are within normal limits.  A focused examination was performed of the following areas: Face, nose  Relevant physical exam findings are noted in the Assessment and Plan.  R temporal scalp x1, Left frontal scalp x1 (2) Erythematous keratotic or waxy stuck-on papule Nose x 2 (2) Erythematous thin papules/macules with gritty scale.   Assessment & Plan   ACTINIC DAMAGE WITH PRECANCEROUS ACTINIC KERATOSES Counseling for Topical Chemotherapy Management: Patient exhibits: - Severe, confluent actinic changes with pre-cancerous actinic keratoses that is secondary to cumulative UV radiation exposure over time - Condition that is severe; chronic, not at goal. - diffuse scaly erythematous macules and papules with underlying dyspigmentation - Discussed Prescription "Field Treatment" topical Chemotherapy for Severe, Chronic Confluent Actinic Changes with Pre-Cancerous Actinic Keratoses Field treatment involves treatment of an entire area of skin that has confluent Actinic Changes (Sun/ Ultraviolet light damage) and PreCancerous Actinic Keratoses by method of PhotoDynamic Therapy (PDT)  and/or prescription Topical Chemotherapy agents such as 5-fluorouracil, 5-fluorouracil/calcipotriene, and/or imiquimod.  The purpose is to decrease the number of clinically evident and subclinical PreCancerous lesions to prevent progression to development of skin cancer by chemically destroying early precancer changes that may or may not be visible.  It has been shown to reduce the risk of developing skin cancer in the treated area. As a result of treatment, redness, scaling, crusting, and open sores may occur during treatment course. One or more than one of these methods may be used and may have to be used several times to control, suppress and eliminate the PreCancerous changes. Discussed treatment course, expected reaction, and possible side effects. - Recommend daily broad spectrum sunscreen SPF 30+ to sun-exposed areas, reapply every 2 hours as needed.  - Staying in the shade or wearing long sleeves, sun glasses (UVA+UVB protection) and wide brim hats (4-inch brim around the entire circumference of the hat) are also recommended. - Call for new or changing lesions.  - Start 5-fluorouracil/calcipotriene cream twice a day for 5-7 days to affected areas including nose. Also treat forehead and cheeks once nose has healed. Prescription sent to [facility]. Patient advised they will receive an email to purchase the medication online and have it sent to their home. Patient provided with handout reviewing treatment course and side effects and advised to call or message [place] on MyChart with any concerns.  Reviewed course of treatment and expected reaction.  Patient advised to expect inflammation and crusting and advised that erosions are possible.  Patient advised to be diligent with sun protection during and after treatment. Counseled to keep medication out of reach of  children and pets.   HISTORY OF BASAL CELL CARCINOMA OF THE SKIN. Mid forehead. Mohs 10/08/2015. Dr. Adriana Simas. - No evidence of  recurrence today - Recommend regular full body skin exams - Recommend daily broad spectrum sunscreen SPF 30+ to sun-exposed areas, reapply every 2 hours as needed.  - Call if any new or changing lesions are noted between office visits   INFLAMED SEBORRHEIC KERATOSIS (2) R temporal scalp x1, Left frontal scalp x1 (2) Symptomatic, irritating, patient would like treated. Destruction of lesion - R temporal scalp x1, Left frontal scalp x1 (2)  Destruction method: cryotherapy   Informed consent: discussed and consent obtained   Lesion destroyed using liquid nitrogen: Yes   Region frozen until ice ball extended beyond lesion: Yes   Outcome: patient tolerated procedure well with no complications   Post-procedure details: wound care instructions given   Additional details:  Prior to procedure, discussed risks of blister formation, small wound, skin dyspigmentation, or rare scar following cryotherapy. Recommend Vaseline ointment to treated areas while healing.  AK (ACTINIC KERATOSIS) (2) Nose x 2 (2) Actinic keratoses are precancerous spots that appear secondary to cumulative UV radiation exposure/sun exposure over time. They are chronic with expected duration over 1 year. A portion of actinic keratoses will progress to squamous cell carcinoma of the skin. It is not possible to reliably predict which spots will progress to skin cancer and so treatment is recommended to prevent development of skin cancer.  Recommend daily broad spectrum sunscreen SPF 30+ to sun-exposed areas, reapply every 2 hours as needed.  Recommend staying in the shade or wearing long sleeves, sun glasses (UVA+UVB protection) and wide brim hats (4-inch brim around the entire circumference of the hat). Call for new or changing lesions. Destruction of lesion - Nose x 2 (2)  Destruction method: cryotherapy   Informed consent: discussed and consent obtained   Lesion destroyed using liquid nitrogen: Yes   Region frozen until ice  ball extended beyond lesion: Yes   Outcome: patient tolerated procedure well with no complications   Post-procedure details: wound care instructions given   Additional details:  Prior to procedure, discussed risks of blister formation, small wound, skin dyspigmentation, or rare scar following cryotherapy. Recommend Vaseline ointment to treated areas while healing.    Return in about 6 months (around 06/09/2024) for AK Follow Up, ISK Follow Up, UBSE, HxBCC.  I, Lawson Radar, CMA, am acting as scribe for Willeen Niece, MD.   Documentation: I have reviewed the above documentation for accuracy and completeness, and I agree with the above.  Willeen Niece, MD

## 2023-12-09 NOTE — Patient Instructions (Addendum)
 Cryotherapy Aftercare  Wash gently with soap and water everyday.   Apply Vaseline Jelly daily until healed.   - Start 5-fluorouracil/calcipotriene cream twice a day for 5-7 days to affected areas including nose. Treat forehead and cheeks once nose has healed. Prescription sent to [facility]. Patient advised they will receive an email to purchase the medication online and have it sent to their home. Patient provided with handout reviewing treatment course and side effects and advised to call or message [place] on MyChart with any concerns.  Reviewed course of treatment and expected reaction.  Patient advised to expect inflammation and crusting and advised that erosions are possible.  Patient advised to be diligent with sun protection during and after treatment. Counseled to keep medication out of reach of children and pets.   Instructions for [facility]  One or more of your medications was sent to the [facility]. You will receive an email from them and can purchase the medicine through that link. It will then be mailed to your home at the address you confirmed. If for any reason you do not receive an email from them, please check your spam folder. If you still do not find the email, please let us know. [facility] phone number is [phone].   5-fluorouracil/calcipotriene cream is is a type of field treatment used to treat precancers, thin skin cancers, and areas of sun damage. Expected reaction includes irritation and mild inflammation potentially progressing to more severe inflammation including redness, scaling, crusting and open sores/erosions.  If too much irritation occurs, ensure application of only a thin layer and decrease frequency of use to achieve a tolerable level of inflammation. Recommend applying Vaseline ointment to open sores as needed.  Minimize sun exposure while under treatment. Recommend daily broad  spectrum sunscreen SPF 30+ to sun-exposed areas, reapply every 2 hours as needed.       Recommend daily broad spectrum sunscreen SPF 30+ to sun-exposed areas, reapply every 2 hours as needed. Call for new or changing lesions.  Staying in the shade or wearing long sleeves, sun glasses (UVA+UVB protection) and wide brim hats (4-inch brim around the entire circumference of the hat) are also recommended for sun protection.       Due to recent changes in healthcare laws, you may see results of your pathology and/or laboratory studies on MyChart before the doctors have had a chance to review them. We understand that in some cases there may be results that are confusing or concerning to you. Please understand that not all results are received at the same time and often the doctors may need to interpret multiple results in order to provide you with the best plan of care or course of treatment. Therefore, we ask that you please give [place] 2 business days to thoroughly review all your results before contacting the office for clarification. Should we see a critical lab result, you will be contacted sooner.   If You Need Anything After Your Visit  If you have any questions or concerns for your doctor, please call our main line at [phone] and press option 4 to reach your doctor's medical assistant. If no one answers, please leave a voicemail as directed and we will return your call as soon as possible. Messages left after 4 pm will be answered the following business day.   You may also send [place] a message via MyChart. We typically respond to MyChart messages within 1-2 business days.  For prescription refills, please ask your pharmacy to contact our office. Our fax number is [phone].  If you have an urgent issue when the clinic is closed that cannot wait until the next business day, you can page your doctor at the number below.    Please note that while we do our best to be available for urgent  issues outside of office hours, we are not available 24/7.   If you have an urgent issue and are unable to reach [place], you may choose to seek medical care at your doctor's office, retail clinic, urgent care center, or emergency room.  If you have a medical emergency, please immediately call 911 or go to the emergency department.  Pager Numbers  - Dr. Gwen Pounds: [phone]  - Dr. Roseanne Reno: [phone]  - Dr. Katrinka Blazing: [phone]   In the event of inclement weather, please call our main line at [phone] for an update on the status of any delays or closures.  Dermatology Medication Tips: Please keep the boxes that topical medications come in in order to help keep track of the instructions about where and how to use these. Pharmacies typically print the medication instructions only on the boxes and not directly on the medication tubes.   If your medication is too expensive, please contact our office at [phone] option 4 or send [place] a message through MyChart.   We are unable to tell what your co-pay for medications will be in advance as this is different depending on your insurance coverage. However, we may be able to find a substitute medication at lower cost or fill out paperwork to get insurance to cover a needed medication.   If a prior authorization is required to get your medication covered by your insurance company, please allow [place] 1-2 business days to complete this process.  Drug prices often vary depending on where the prescription is filled and some pharmacies may offer cheaper prices.  The website www.goodrx.com contains coupons for medications through different pharmacies. The prices here do not account for what the cost may be with help from insurance (it may be cheaper with your insurance), but the website can give you the price if you did not use any insurance.  - You can print the associated coupon and take it with your prescription to the pharmacy.  - You may also stop  by our office during regular business hours and pick up a GoodRx coupon card.  - If you need your prescription sent electronically to a different pharmacy, notify our office through [facility] or by phone at [phone] option 4.     Si Usted Necesita Algo Despus de Su Visita  Tambin puede enviarnos un mensaje a travs de Clinical cytogeneticist. Por lo general respondemos a los mensajes de MyChart en el transcurso de 1 a 2 das hbiles.  Para renovar recetas, por favor pida a su farmacia que se ponga en contacto con nuestra oficina. Annie Sable de fax es [place] [phone].  Si tiene un asunto urgente cuando la clnica est cerrada y que no puede esperar hasta el siguiente da hbil, puede llamar/localizar a su doctor(a) al nmero que aparece a continuacin.   Por favor, tenga en cuenta que aunque hacemos todo lo posible para estar disponibles para asuntos urgentes fuera del horario de [place], no estamos disponibles las 24 horas del da, los 7 [address]  [address] de la [place].   Si tiene un problema urgente y no puede comunicarse con nosotros, puede optar por buscar atencin mdica  en el consultorio de su doctor(a), en una clnica privada, en un centro de atencin urgente o en una sala de emergencias.  Si tiene Engineer, drilling, por favor llame inmediatamente al 911 o vaya a la sala de emergencias.  Nmeros de bper  - Dr. Gwen Pounds: [phone]  - Dra. Roseanne Reno: [phone]  - Dr. Katrinka Blazing: [phone]   En caso de inclemencias del tiempo, por favor llame a Lacy Duverney principal al [phone] para una actualizacin sobre el [place] de cualquier retraso o cierre.  Consejos para la medicacin en dermatologa: Por favor, guarde las cajas en las que vienen los medicamentos de uso tpico para ayudarle a seguir las instrucciones sobre dnde y cmo usarlos. Las farmacias generalmente imprimen las instrucciones del medicamento slo en las cajas y no directamente en los tubos del [place].   Si su  medicamento es muy caro, por favor, pngase en contacto con Rolm Gala llamando al [phone] y presione la opcin 4 o envenos un mensaje a travs de Clinical cytogeneticist.   No podemos decirle cul ser su copago por los medicamentos por adelantado ya que esto es diferente dependiendo de la cobertura de su seguro. Sin embargo, es posible que podamos encontrar un medicamento sustituto a Audiological scientist un formulario para que el seguro cubra el medicamento que se considera necesario.   Si se requiere una autorizacin previa para que su compaa de seguros [place] su medicamento, por favor permtanos de 1 a 2 das hbiles para completar [address]  Los precios de los medicamentos varan con frecuencia dependiendo del Environmental consultant de dnde se surte la receta y alguna farmacias pueden ofrecer precios ms baratos.  El sitio web www.goodrx.com tiene cupones para medicamentos de Health and safety inspector. Los precios aqu no tienen en cuenta lo que podra costar con la ayuda del seguro (puede ser ms barato con su seguro), pero el sitio web puede darle el precio si no utiliz Tourist information centre manager.  - Puede imprimir el cupn correspondiente y llevarlo con su receta a la farmacia.  - Tambin puede pasar por nuestra oficina durante el horario de atencin regular y Education officer, museum una tarjeta de cupones de GoodRx.  - Si necesita que su receta se enve electrnicamente a una farmacia diferente, informe a nuestra oficina a travs de MyChart de [place] o por telfono llamando al [phone] y presione la opcin 4.

## 2024-03-02 DIAGNOSIS — D3131 Benign neoplasm of right choroid: Secondary | ICD-10-CM | POA: Diagnosis not present

## 2024-03-02 DIAGNOSIS — M3501 Sicca syndrome with keratoconjunctivitis: Secondary | ICD-10-CM | POA: Diagnosis not present

## 2024-03-02 DIAGNOSIS — Z961 Presence of intraocular lens: Secondary | ICD-10-CM | POA: Diagnosis not present

## 2024-04-01 DIAGNOSIS — Z955 Presence of coronary angioplasty implant and graft: Secondary | ICD-10-CM

## 2024-04-01 HISTORY — DX: Presence of coronary angioplasty implant and graft: Z95.5

## 2024-04-08 ENCOUNTER — Encounter: Payer: Self-pay | Admitting: Nurse Practitioner

## 2024-04-08 ENCOUNTER — Ambulatory Visit: Attending: Nurse Practitioner | Admitting: Nurse Practitioner

## 2024-04-08 VITALS — BP 130/90 | HR 69 | Ht 69.0 in | Wt 223.8 lb

## 2024-04-08 DIAGNOSIS — I25110 Atherosclerotic heart disease of native coronary artery with unstable angina pectoris: Secondary | ICD-10-CM

## 2024-04-08 DIAGNOSIS — I10 Essential (primary) hypertension: Secondary | ICD-10-CM

## 2024-04-08 DIAGNOSIS — E785 Hyperlipidemia, unspecified: Secondary | ICD-10-CM

## 2024-04-08 DIAGNOSIS — I471 Supraventricular tachycardia, unspecified: Secondary | ICD-10-CM | POA: Diagnosis not present

## 2024-04-08 DIAGNOSIS — I25118 Atherosclerotic heart disease of native coronary artery with other forms of angina pectoris: Secondary | ICD-10-CM | POA: Diagnosis not present

## 2024-04-08 NOTE — Progress Notes (Signed)
 Office Visit    Patient Name: Jonathan Escobar Date of Encounter: 04/08/2024  Primary Care Provider:  Justus Leita DEL, MD Primary Cardiologist:  Deatrice Cage, MD  Cardiology APP:  Vivienne Lonni Ingle, NP   Chief Complaint    78 y.o. male with a history of CAD status post prior PCI and CABG (2013), hypertension, hyperlipidemia, diastolic dysfunction, obesity, palpitations, and PSVT, who presents for follow-up related to unstable angina.  Past Medical History   Subjective   Past Medical History:  Diagnosis Date   CAD (coronary artery disease), autologous vein bypass graft 07/2013   Inf STEMI - PCI to SVG-RPDA:    CAD in native artery - prior Inferior MI - RCA PCI; 04/2012 - Inferior STEMI - RCA occluded (unable to dilate lesion adequately) + LAD & D1 disease --> referred for CABG 04/07/2012   CAD S/P percutaneous coronary angioplasty 1999   BMS to prox RCA 1999; Brachytherapy ~2003, followed by DES  PCI (Taxus) for ISR.   Coronary stent restenosis due to progression of disease 2000; 2005   (While Still In [place] ) Status post Brachytherapy to RCA ISR - 2000; Redo PCI with DES 2005   Diverticulitis    Essential hypertension    CONTROLLED ON MEDS   GERD (gastroesophageal reflux disease)    Grade I diastolic dysfunction    History of GI bleed     while on Plavix    History of viral meningitis    following GI bleed    History of: ST elevation myocardial infarction (STEMI) of inferior wall, subsequent episode of care 04/25/2012   RCA: 95% ISR followed by 100% stenosis post-stent with significant RPL lesions; LAD: Tandem 80 and 70% lesions involving D1 (1, 1, 1) --> initial angioplasty to RCA aspiration thrombectomy --> urgent CABG;   Hyperlipidemia LDL goal <70    Non-recurrent unilateral inguinal hernia without obstruction or gangrene    Obesity (BMI 30.0-34.9)    Plantar fasciitis    left heel   Right inguinal hernia 07/02/2016   S/P CABG x 4: LIMA-LAD, seq  SVG-PD-PLA; SVG-diag. 04/28/2012   S/P coronary artery stent placement Nov-Dec 2014   a) Inf STEMIL 11/28/'14 --> extensive (full metal Jacket) of SVG-RPDA Promus DES - 3 mm x 38 mm, 3 mm x 38 mm, 3 mm x 20 mm, 2.75 mm x 38 mm -- all postdilated to ~3.3 mm;; b) 12/1/'14: staged PCI to distal SVG-diagonal: Xience Xpedition 2.5 mm x 20 mm (2.66 mm)   ST elevation myocardial infarction (STEMI) of inferior wall, initial episode of care - s/p PTCA of 95% ISR to 70% and 100% distal stent occlusion to ~40% (8/25) 04/25/2012   ST elevation myocardial infarction (STEMI) of inferior wall, subsequent episode of care (HCC) 07/29/2013   Diffuse near occlusion of SVG-rPDA, 90% SVG-Diag   STEMI 07/29/13- successful complex full metal jacket stenting of the SVG-PDA/PLA -07/29/2013, staged SVG-Dx PCI 12/01 07/29/2013   Supraventricular tachycardia (HCC)    Trigger finger, left little finger    Past Surgical History:  Procedure Laterality Date   CARDIAC CATHETERIZATION  04/28/12   RCA: 95% ISR followed by 100% stenosis post-stent with significant RPL lesions; LAD: Tandem 80 and 70% lesions involving D1 (1, 1, 1) --> initial angioplasty to RCA aspiration thrombectomy --> urgent CABG;   CATARACT EXTRACTION W/PHACO Right 07/21/2023   Procedure: CATARACT EXTRACTION PHACO AND INTRAOCULAR LENS PLACEMENT (IOC) RIGHT  CLAREON VIVITY TORIC  6.70  00:40.5;  Surgeon: Jaye Elsie, MD;  Location:  [facility];  Service: Ophthalmology;  Laterality: Right;   CATARACT EXTRACTION W/PHACO Left 08/11/2023   Procedure: CATARACT EXTRACTION PHACO AND INTRAOCULAR LENS PLACEMENT (IOC) LEFT CLAREON VIVITY TORIC  5.16  00:38.5;  Surgeon: Jaye Fallow, MD;  Location: [facility];  Service: Ophthalmology;  Laterality: Left;   CORONARY ARTERY BYPASS GRAFT  04/28/2012   Procedure: CORONARY ARTERY BYPASS GRAFTING (CABG);  Surgeon: Dorise MARLA Fellers, MD;  Location: [facility] OR;  Service: Open Heart Surgery;  Laterality: N/A;    HERNIA REPAIR Left    INGUINAL HERNIA REPAIR Right 08/06/2016   Procedure: HERNIA REPAIR INGUINAL ADULT;  Surgeon: Carlin Pastel, MD;  Location: [facility];  Service: General;  Laterality: Right;   LEFT AND RIGHT HEART CATHETERIZATION WITH CORONARY ANGIOGRAM N/A 04/25/2012   Procedure: LEFT AND RIGHT HEART CATHETERIZATION WITH CORONARY ANGIOGRAM;  Surgeon: Alm LELON Clay, MD;  Location: [facility] CATH LAB;  Service: Cardiovascular;  Laterality: N/A;   LEFT HEART CATH AND CORONARY ANGIOGRAPHY Left 11/25/2022   Procedure: LEFT HEART CATH AND CORONARY ANGIOGRAPHY;  Surgeon: Darron Deatrice LABOR, MD;  Location: [facility] INVASIVE CV LAB;  Service: Cardiovascular;  Laterality: Left;   LEFT HEART CATHETERIZATION WITH CORONARY/GRAFT ANGIOGRAM  07/29/2013   Procedure: LEFT HEART CATHETERIZATION WITH EL BILE;  Surgeon: Ozell JONETTA Fell, MD;  Location: [facility] CATH LAB;  Service: Cardiovascular; ; 95% stenosis with percent mid occlusion of SVG-RPDA; 90% anastomotic SVG-diagonal, moderate 50% disease in the native circumflex with patent OM 1 OM 2. 80-90% proximal LAD with patent LIMA to LAD , EF 50%   MOHS SURGERY     FOREHEAD   NM MYOVIEW  LTD  Jan 2015   Moderate Inferior infarct (as expected), but no ischemia & EF ~45-50%   PERCUTANEOUS CORONARY STENT INTERVENTION (PCI-S)  07/29/2013   Procedure: PERCUTANEOUS CORONARY STENT INTERVENTION (PCI-S);  Surgeon: Ozell JONETTA Fell, MD;  Location: [facility] CATH LAB;  Service: Cardiovascular;; PCI to SVG-RPDA: Promus DES - 3 mm x 38 mm, 3 mm x 38 mm, 3 mm x 20 mm, 2.75 mm x 38 mm -- all postdilated to ~3.3 mm;   PERCUTANEOUS CORONARY STENT INTERVENTION (PCI-S) N/A 08/01/2013   Procedure: PERCUTANEOUS CORONARY STENT INTERVENTION (PCI-S);  Surgeon: Alm LELON Clay, MD;  Location: [facility] CATH LAB;  Service: Cardiovascular;staged PCI to distal SVG-diagonal: Xience Xpedition 2.5 mm x 20 mm (2.66 mm)     TRANSTHORACIC ECHOCARDIOGRAM  12/'13; 07/30/2013   a) Post CABG: EF 50-55%,  moderate HK of anteroseptal wall, septal dyskinesis/dyssynergy due to poststernotomy;  grade 1 diastolic dysfunction. Mildly dilated LA, mildly reduced RV function-likely due to septal dyssynergy.; b) 11/'14 Post Inferior STEMI: EF 50-55% mild LVH. Mild hypokinesis of the distal lateral, and basal to mid inferior lateral and inferior walls -- consistent with RCA infarct    Allergies  Allergies  Allergen Reactions   Norvasc  [Amlodipine  Besylate]     Dizzy   Ace Inhibitors Other (See Comments)    Cough    Crestor  [Rosuvastatin ] Other (See Comments)    Muscle pain       History of Present Illness      78 y.o. y/o male with a history of CAD, hypertension, hyperlipidemia, diastolic dysfunction, obesity, palpitations, and PSVT.  His cardiac history dates back to 1999, when he required bare-metal stenting to the proximal RCA with subsequent brachytherapy in 2000 and 2003 due to in-stent restenosis followed by redo PCI with Taxus drug-eluting stent placement in 2005, again due to in-stent restenosis.  In August 2013,  he suffered an inferior STEMI and was found to have a 95% in-stent restenosis within the RCA as well as 70% mid LAD disease.  After aspiration thrombectomy and balloon angioplasty of the RCA, he underwent urgent CABG with placement of a LIMA to the LAD, sequential vein graft to the PDA and PLA, and a vein graft to the diagonal.  He suffered inferior STEMI again in November 2014 with finding of occluded vein graft to the PDA that required treatment with extensive PCI (full metal jacket) followed by staged intervention of anastomotic lesion and the vein graft to the diagonal.  In early 2024, he had recurrent angina and underwent stress testing notable for 1 mm ST segment depression on ECG during exercise (exercise time of 6 minutes), and moderate ischemia in the basal to mid inferolateral region.  Diagnostic catheterization in March 2024 showed patent LIMA to the LAD, occluded vein graft to  the diagonal, and patent vein graft to the RPDA/PL w/ 40% proximal ISR.  There was severe disease in the right posterior AV groove and PL branches and medical therapy was recommended.  He was placed on Ranexa  500 mg twice daily.   Mr. Bellina was doing well at cardiology visit in January 2025 however, over the past month, he has been experiencing progressive exertional angina, that is now occurring with relatively minimal activity, associated with dyspnea, sometimes radiating down his arms, lasting 5 to 10 minutes, resolving with rest.  He has not had to take any sublingual nitrates.  He had a recurrent episode of substernal chest discomfort with radiation down his arms after eating barbecue ribs last night.  Again, symptoms lasted about 10 minutes and resolved without intervention.  He has not had any recurrent chest pain today.  Symptoms are reminiscent of prior angina.  He denies palpitations, PND, orthopnea, dizziness, syncope, edema, or early satiety. Objective   Home Medications    Current Outpatient Medications  Medication Sig Dispense Refill   acetaminophen  (TYLENOL ) 325 MG tablet Take 2 tablets (650 mg total) by mouth every 4 (four) hours as needed for headache or mild pain.     Alirocumab (PRALUENT) 75 MG/ML SOAJ Inject 75 mg into the skin every 14 (fourteen) days.     Ascorbic Acid (VITAMIN C) 1000 MG tablet Take 1,000 mg by mouth daily.     aspirin  81 MG tablet Take 81 mg by mouth daily.     atorvastatin  (LIPITOR ) 20 MG tablet Take 1 tablet (20 mg total) by mouth daily.     carvedilol  (COREG ) 6.25 MG tablet Take 6.25 mg by mouth 2 (two) times daily with a meal. 6.25 QAM, 12.5 mg QPM     cetirizine (ZYRTEC) 10 MG tablet Take 10 mg by mouth daily as needed for allergies. PM     clopidogrel  (PLAVIX ) 75 MG tablet TAKE 1 TABLET (75 MG TOTAL) BY MOUTH DAILY WITH BREAKFAST. 90 tablet 0   fluorouracil  (EFUDEX ) 5 % cream Apply twice daily to nose for 5-7 days. Treat sections of face twice daily  for 5-7 days. 30 g 2   ketoconazole  (NIZORAL ) 2 % cream Apply 1 Application topically 2 (two) times daily. 60 g 11   NITROSTAT  0.4 MG SL tablet PLACE 1 TABLET UNDER THE TONGUE EVERY 5 MINUTES X 3 DOSES AS NEEDED FOR CHEST PAIN. 25 tablet 0   pantoprazole  (PROTONIX ) 40 MG tablet Take 1 tablet (40 mg total) by mouth daily at 6 (six) AM. 90 tablet 3   ranolazine  (RANEXA ) 500 MG  12 hr tablet Take 500 mg by mouth 2 (two) times daily. (Patient taking differently: Take 1,000 mg by mouth 2 (two) times daily.)     tadalafil (CIALIS) 20 MG tablet Take 10 mg by mouth daily as needed for erectile dysfunction.     traZODone  (DESYREL ) 50 MG tablet Take 25 mg by mouth at bedtime.     valsartan  (DIOVAN ) 320 MG tablet TAKE 1 TABLET BY MOUTH EVERY DAY 90 tablet 2   No current facility-administered medications for this visit.    Family History    Family History  Problem Relation Age of Onset   Alzheimer's disease Mother    Heart failure Father    Coronary artery disease Father    Valvular heart disease Sister     Social History    Social History   Socioeconomic History   Marital status: Married    Spouse name: Not on file   Number of children: Not on file   Years of education: Not on file   Highest education level: Bachelor's degree (e.g., BA, AB, BS)  Occupational History   Not on file  Tobacco Use   Smoking status: Former    Current packs/day: 0.00    Average packs/day: 1 pack/day for 10.0 years (10.0 ttl pk-yrs)    Types: Cigarettes    Start date: 09/01/1966    Quit date: 09/01/1976    Years since quitting: 47.6   Smokeless tobacco: Never  Vaping Use   Vaping status: Never Used  Substance and Sexual Activity   Alcohol use: Yes    Alcohol/week: 10.0 standard drinks of alcohol    Types: 10 Shots of liquor per week    Comment: 1-2 shots of jack daniels a day.   Drug use: No   Sexual activity: Yes  Other Topics Concern   Not on file  Social History Narrative   He is a married father of  3, grandfather and 2.   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   He completed cardiac rehabilitation but as [place] got out of the habit of exercising.   Social Drivers of Corporate investment banker Strain: Low Risk  (03/12/2024)   Overall Financial Resource Strain (CARDIA)    Difficulty of Paying Living Expenses: Not hard at all  Food Insecurity: No Food Insecurity (03/12/2024)   Hunger Vital Sign    Worried About Running Out of Food in the Last Year: Never true    Ran Out of Food in the Last Year: Never true  Transportation Needs: Unknown (03/12/2024)   PRAPARE - Administrator, Civil Service (Medical): No    Lack of Transportation (Non-Medical): Not on file  Physical Activity: Insufficiently Active (03/12/2024)   Exercise Vital Sign    Days of Exercise per Week: 7 days    Minutes of Exercise per Session: 10 min  Stress: No Stress Concern Present (03/12/2024)   [employer] of Occupational Health - Occupational Stress Questionnaire    Feeling of Stress: Not at all  Social Connections: Moderately Integrated (03/12/2024)   Social Connection and Isolation Panel    Frequency of Communication with Friends and Family: More than three times a week    Frequency of Social Gatherings with Friends and Family: Once a  week    Attends Religious Services: More than 4 times per year    Active Member of Clubs or Organizations: No    Attends Banker Meetings: Not on file    Marital Status: Married  Intimate Partner Violence: Not At Risk (02/25/2023)   Humiliation, Afraid, Rape, and Kick questionnaire    Fear of Current or Ex-Partner: No    Emotionally Abused: No    Physically Abused: No    Sexually Abused: No    Review of Systems    Accelerating  exertional angina associated with dyspnea and radiation to his arms as outlined above.  He denies chest pain, palpitations, dyspnea, pnd, orthopnea, n, v, dizziness, syncope, edema, weight gain, early satiety, melena, bright red blood per rectum, hematuria, weakness, or change in mental status.  All other systems reviewed and negative.  Physical Exam    VS:  BP (!) 130/90   Pulse 69   Ht 5' 9 (1.753 m)   Wt 223 lb 12.8 oz (101.5 kg)   SpO2 95%   BMI 33.05 kg/m  , BMI Body mass index is 33.05 kg/m.          GEN: Well nourished, well developed, in no acute distress. HEENT: normal. Neck: Supple, no JVD, carotid bruits, or masses. Cardiac: RRR, no murmurs, rubs, or gallops. No clubbing, cyanosis, edema.  Radials 2+/PT 2+ and equal bilaterally.  Respiratory:  Respirations regular and unlabored, clear to auscultation bilaterally. GI: Soft, nontender, nondistended, BS + x 4. MS: no deformity or atrophy. Skin: warm and dry, no rash. Neuro:  Strength and sensation are intact. Psych: Normal affect.  Accessory Clinical Findings    ECG personally reviewed by me today - EKG Interpretation Date/Time:  Friday April 08 2024 15:19:48 EDT Ventricular Rate:  69 PR Interval:  188 QRS Duration:  112 QT Interval:  420 QTC Calculation: 450 R Axis:   -38  Text Interpretation: Normal sinus rhythm with sinus arrhythmia Left axis deviation Inferior infarct , age undetermined Confirmed by Vivienne Bruckner [phone]) on 04/08/2024 3:23:31 PM  - no acute changes.  Lab Results  Component Value Date   WBC 5.1 11/09/2023   HGB 13.1 11/09/2023   HCT 39.0 11/09/2023   MCV 94 11/09/2023   PLT 224 11/09/2023   Lab Results  Component Value Date   CREATININE 1.04 11/09/2023   BUN 13 11/09/2023   NA 133 (L) 11/09/2023   K 4.9 11/09/2023   CL 99 11/09/2023   CO2 23 11/09/2023   Lab Results  Component Value Date   ALT 28 11/09/2023   AST 25 11/09/2023   ALKPHOS 79 11/09/2023   BILITOT 0.6  11/09/2023   Lab Results  Component Value Date   CHOL 149 11/06/2022   HDL 46 11/06/2022   LDLCALC 74 11/06/2022   TRIG 169 (H) 11/06/2022   CHOLHDL 3.2 11/06/2022    Lab Results  Component Value Date   HGBA1C 5.9 (H) 11/09/2023   Lab Results  Component Value Date   TSH 2.190 11/09/2023       Assessment & Plan    1.  Unstable angina/CAD: Long history of coronary artery disease status postinitial bare-metal stenting in 1999 followed by multiple PCI's and subsequently CABG x 4 following inferior STEMI in August 2013.  He had recurrent inferior STEMI in November 2014 and required extensive stenting in the vein graft to the RPDA/PL.  Most recent catheterization in March 2024 showed relatively stable anatomy with only mild in-stent restenosis  within the vein graft to the RPDA/PL as well as severe disease in the right posterior AV groove and PL branches.  Medical therapy was recommended and he has been on Ranexa  500 mg twice a day since.  Over the past month, he has had accelerating exertional angina and dyspnea.  Symptoms sometimes radiate to his arms and last night he had an episode occurring after eating barbecue ribs.  He has not had to use any sublingual nitrates.  He is chest pain-free currently.  ECG is without acute ST or T changes.  Discussed options for management.  He will continue Ranexa  1000 mg twice daily, which he increased on his own starting yesterday.  Given history and accelerating symptoms, we mutually agreed to pursue diagnostic catheterization (groin access was used in 2024 due to difficulty with left radial access).  The patient understands that risks include but are not limited to stroke (1 in 1000), death (1 in 1000), kidney failure [usually temporary] (1 in 500), bleeding (1 in 200), allergic reaction [possibly serious] (1 in 200), and agrees to proceed.  He is tentatively scheduled for next week with Dr. Darron.  Continue aspirin , statin, beta-blocker, Plavix , ARB, Praluent,  and Ranexa .  2.  Primary hypertension: Diastolic pressure mildly elevated today.  Continue beta-blocker and ARB.  3.  Hyperlipidemia: On atorvastatin  and Praluent, the latter of which was started about 3 months ago.  Previously on Zetia  but this was discontinued after LDL dropped to 33 following initiation of Praluent (labs followed at [facility]).  4.  Palpitations/PSVT: Quiescent on beta-blocker therapy.  5.  Disposition: CBC and basic metabolic panel today.  Plan for diagnostic catheterization next week.  Follow-up in cardiology clinic approximate 2 weeks post catheterization.  Lonni Meager, NP 04/08/2024, 5:35 PM

## 2024-04-08 NOTE — H&P (View-Only) (Signed)
 Office Visit    Patient Name: Jonathan Escobar Date of Encounter: 04/08/2024  Primary Care Provider:  Justus Leita DEL, MD Primary Cardiologist:  Deatrice Cage, MD  Cardiology APP:  Vivienne Lonni Ingle, NP   Chief Complaint    78 y.o. male with a history of CAD status post prior PCI and CABG (2013), hypertension, hyperlipidemia, diastolic dysfunction, obesity, palpitations, and PSVT, who presents for follow-up related to unstable angina.  Past Medical History   Subjective   Past Medical History:  Diagnosis Date   CAD (coronary artery disease), autologous vein bypass graft 07/2013   Inf STEMI - PCI to SVG-RPDA:    CAD in native artery - prior Inferior MI - RCA PCI; 04/2012 - Inferior STEMI - RCA occluded (unable to dilate lesion adequately) + LAD & D1 disease --> referred for CABG 04/07/2012   CAD S/P percutaneous coronary angioplasty 1999   BMS to prox RCA 1999; Brachytherapy ~2003, followed by DES  PCI (Taxus) for ISR.   Coronary stent restenosis due to progression of disease 2000; 2005   (While Still In [place] ) Status post Brachytherapy to RCA ISR - 2000; Redo PCI with DES 2005   Diverticulitis    Essential hypertension    CONTROLLED ON MEDS   GERD (gastroesophageal reflux disease)    Grade I diastolic dysfunction    History of GI bleed     while on Plavix    History of viral meningitis    following GI bleed    History of: ST elevation myocardial infarction (STEMI) of inferior wall, subsequent episode of care 04/25/2012   RCA: 95% ISR followed by 100% stenosis post-stent with significant RPL lesions; LAD: Tandem 80 and 70% lesions involving D1 (1, 1, 1) --> initial angioplasty to RCA aspiration thrombectomy --> urgent CABG;   Hyperlipidemia LDL goal <70    Non-recurrent unilateral inguinal hernia without obstruction or gangrene    Obesity (BMI 30.0-34.9)    Plantar fasciitis    left heel   Right inguinal hernia 07/02/2016   S/P CABG x 4: LIMA-LAD, seq  SVG-PD-PLA; SVG-diag. 04/28/2012   S/P coronary artery stent placement Nov-Dec 2014   a) Inf STEMIL 11/28/'14 --> extensive (full metal Jacket) of SVG-RPDA Promus DES - 3 mm x 38 mm, 3 mm x 38 mm, 3 mm x 20 mm, 2.75 mm x 38 mm -- all postdilated to ~3.3 mm;; b) 12/1/'14: staged PCI to distal SVG-diagonal: Xience Xpedition 2.5 mm x 20 mm (2.66 mm)   ST elevation myocardial infarction (STEMI) of inferior wall, initial episode of care - s/p PTCA of 95% ISR to 70% and 100% distal stent occlusion to ~40% (8/25) 04/25/2012   ST elevation myocardial infarction (STEMI) of inferior wall, subsequent episode of care (HCC) 07/29/2013   Diffuse near occlusion of SVG-rPDA, 90% SVG-Diag   STEMI 07/29/13- successful complex full metal jacket stenting of the SVG-PDA/PLA -07/29/2013, staged SVG-Dx PCI 12/01 07/29/2013   Supraventricular tachycardia (HCC)    Trigger finger, left little finger    Past Surgical History:  Procedure Laterality Date   CARDIAC CATHETERIZATION  04/28/12   RCA: 95% ISR followed by 100% stenosis post-stent with significant RPL lesions; LAD: Tandem 80 and 70% lesions involving D1 (1, 1, 1) --> initial angioplasty to RCA aspiration thrombectomy --> urgent CABG;   CATARACT EXTRACTION W/PHACO Right 07/21/2023   Procedure: CATARACT EXTRACTION PHACO AND INTRAOCULAR LENS PLACEMENT (IOC) RIGHT  CLAREON VIVITY TORIC  6.70  00:40.5;  Surgeon: Jaye Elsie, MD;  Location:  [facility];  Service: Ophthalmology;  Laterality: Right;   CATARACT EXTRACTION W/PHACO Left 08/11/2023   Procedure: CATARACT EXTRACTION PHACO AND INTRAOCULAR LENS PLACEMENT (IOC) LEFT CLAREON VIVITY TORIC  5.16  00:38.5;  Surgeon: Jaye Fallow, MD;  Location: [facility];  Service: Ophthalmology;  Laterality: Left;   CORONARY ARTERY BYPASS GRAFT  04/28/2012   Procedure: CORONARY ARTERY BYPASS GRAFTING (CABG);  Surgeon: Dorise MARLA Fellers, MD;  Location: [facility] OR;  Service: Open Heart Surgery;  Laterality: N/A;    HERNIA REPAIR Left    INGUINAL HERNIA REPAIR Right 08/06/2016   Procedure: HERNIA REPAIR INGUINAL ADULT;  Surgeon: Carlin Pastel, MD;  Location: [facility];  Service: General;  Laterality: Right;   LEFT AND RIGHT HEART CATHETERIZATION WITH CORONARY ANGIOGRAM N/A 04/25/2012   Procedure: LEFT AND RIGHT HEART CATHETERIZATION WITH CORONARY ANGIOGRAM;  Surgeon: Alm LELON Clay, MD;  Location: [facility] CATH LAB;  Service: Cardiovascular;  Laterality: N/A;   LEFT HEART CATH AND CORONARY ANGIOGRAPHY Left 11/25/2022   Procedure: LEFT HEART CATH AND CORONARY ANGIOGRAPHY;  Surgeon: Darron Deatrice LABOR, MD;  Location: [facility] INVASIVE CV LAB;  Service: Cardiovascular;  Laterality: Left;   LEFT HEART CATHETERIZATION WITH CORONARY/GRAFT ANGIOGRAM  07/29/2013   Procedure: LEFT HEART CATHETERIZATION WITH EL BILE;  Surgeon: Ozell JONETTA Fell, MD;  Location: [facility] CATH LAB;  Service: Cardiovascular; ; 95% stenosis with percent mid occlusion of SVG-RPDA; 90% anastomotic SVG-diagonal, moderate 50% disease in the native circumflex with patent OM 1 OM 2. 80-90% proximal LAD with patent LIMA to LAD , EF 50%   MOHS SURGERY     FOREHEAD   NM MYOVIEW  LTD  Jan 2015   Moderate Inferior infarct (as expected), but no ischemia & EF ~45-50%   PERCUTANEOUS CORONARY STENT INTERVENTION (PCI-S)  07/29/2013   Procedure: PERCUTANEOUS CORONARY STENT INTERVENTION (PCI-S);  Surgeon: Ozell JONETTA Fell, MD;  Location: [facility] CATH LAB;  Service: Cardiovascular;; PCI to SVG-RPDA: Promus DES - 3 mm x 38 mm, 3 mm x 38 mm, 3 mm x 20 mm, 2.75 mm x 38 mm -- all postdilated to ~3.3 mm;   PERCUTANEOUS CORONARY STENT INTERVENTION (PCI-S) N/A 08/01/2013   Procedure: PERCUTANEOUS CORONARY STENT INTERVENTION (PCI-S);  Surgeon: Alm LELON Clay, MD;  Location: [facility] CATH LAB;  Service: Cardiovascular;staged PCI to distal SVG-diagonal: Xience Xpedition 2.5 mm x 20 mm (2.66 mm)     TRANSTHORACIC ECHOCARDIOGRAM  12/'13; 07/30/2013   a) Post CABG: EF 50-55%,  moderate HK of anteroseptal wall, septal dyskinesis/dyssynergy due to poststernotomy;  grade 1 diastolic dysfunction. Mildly dilated LA, mildly reduced RV function-likely due to septal dyssynergy.; b) 11/'14 Post Inferior STEMI: EF 50-55% mild LVH. Mild hypokinesis of the distal lateral, and basal to mid inferior lateral and inferior walls -- consistent with RCA infarct    Allergies  Allergies  Allergen Reactions   Norvasc  [Amlodipine  Besylate]     Dizzy   Ace Inhibitors Other (See Comments)    Cough    Crestor  [Rosuvastatin ] Other (See Comments)    Muscle pain       History of Present Illness      78 y.o. y/o male with a history of CAD, hypertension, hyperlipidemia, diastolic dysfunction, obesity, palpitations, and PSVT.  His cardiac history dates back to 1999, when he required bare-metal stenting to the proximal RCA with subsequent brachytherapy in 2000 and 2003 due to in-stent restenosis followed by redo PCI with Taxus drug-eluting stent placement in 2005, again due to in-stent restenosis.  In August 2013,  he suffered an inferior STEMI and was found to have a 95% in-stent restenosis within the RCA as well as 70% mid LAD disease.  After aspiration thrombectomy and balloon angioplasty of the RCA, he underwent urgent CABG with placement of a LIMA to the LAD, sequential vein graft to the PDA and PLA, and a vein graft to the diagonal.  He suffered inferior STEMI again in November 2014 with finding of occluded vein graft to the PDA that required treatment with extensive PCI (full metal jacket) followed by staged intervention of anastomotic lesion and the vein graft to the diagonal.  In early 2024, he had recurrent angina and underwent stress testing notable for 1 mm ST segment depression on ECG during exercise (exercise time of 6 minutes), and moderate ischemia in the basal to mid inferolateral region.  Diagnostic catheterization in March 2024 showed patent LIMA to the LAD, occluded vein graft to  the diagonal, and patent vein graft to the RPDA/PL w/ 40% proximal ISR.  There was severe disease in the right posterior AV groove and PL branches and medical therapy was recommended.  He was placed on Ranexa  500 mg twice daily.   Jonathan Escobar was doing well at cardiology visit in January 2025 however, over the past month, he has been experiencing progressive exertional angina, that is now occurring with relatively minimal activity, associated with dyspnea, sometimes radiating down his arms, lasting 5 to 10 minutes, resolving with rest.  He has not had to take any sublingual nitrates.  He had a recurrent episode of substernal chest discomfort with radiation down his arms after eating barbecue ribs last night.  Again, symptoms lasted about 10 minutes and resolved without intervention.  He has not had any recurrent chest pain today.  Symptoms are reminiscent of prior angina.  He denies palpitations, PND, orthopnea, dizziness, syncope, edema, or early satiety. Objective   Home Medications    Current Outpatient Medications  Medication Sig Dispense Refill   acetaminophen  (TYLENOL ) 325 MG tablet Take 2 tablets (650 mg total) by mouth every 4 (four) hours as needed for headache or mild pain.     Alirocumab (PRALUENT) 75 MG/ML SOAJ Inject 75 mg into the skin every 14 (fourteen) days.     Ascorbic Acid (VITAMIN C) 1000 MG tablet Take 1,000 mg by mouth daily.     aspirin  81 MG tablet Take 81 mg by mouth daily.     atorvastatin  (LIPITOR ) 20 MG tablet Take 1 tablet (20 mg total) by mouth daily.     carvedilol  (COREG ) 6.25 MG tablet Take 6.25 mg by mouth 2 (two) times daily with a meal. 6.25 QAM, 12.5 mg QPM     cetirizine (ZYRTEC) 10 MG tablet Take 10 mg by mouth daily as needed for allergies. PM     clopidogrel  (PLAVIX ) 75 MG tablet TAKE 1 TABLET (75 MG TOTAL) BY MOUTH DAILY WITH BREAKFAST. 90 tablet 0   fluorouracil  (EFUDEX ) 5 % cream Apply twice daily to nose for 5-7 days. Treat sections of face twice daily  for 5-7 days. 30 g 2   ketoconazole  (NIZORAL ) 2 % cream Apply 1 Application topically 2 (two) times daily. 60 g 11   NITROSTAT  0.4 MG SL tablet PLACE 1 TABLET UNDER THE TONGUE EVERY 5 MINUTES X 3 DOSES AS NEEDED FOR CHEST PAIN. 25 tablet 0   pantoprazole  (PROTONIX ) 40 MG tablet Take 1 tablet (40 mg total) by mouth daily at 6 (six) AM. 90 tablet 3   ranolazine  (RANEXA ) 500 MG  12 hr tablet Take 500 mg by mouth 2 (two) times daily. (Patient taking differently: Take 1,000 mg by mouth 2 (two) times daily.)     tadalafil (CIALIS) 20 MG tablet Take 10 mg by mouth daily as needed for erectile dysfunction.     traZODone  (DESYREL ) 50 MG tablet Take 25 mg by mouth at bedtime.     valsartan  (DIOVAN ) 320 MG tablet TAKE 1 TABLET BY MOUTH EVERY DAY 90 tablet 2   No current facility-administered medications for this visit.    Family History    Family History  Problem Relation Age of Onset   Alzheimer's disease Mother    Heart failure Father    Coronary artery disease Father    Valvular heart disease Sister     Social History    Social History   Socioeconomic History   Marital status: Married    Spouse name: Not on file   Number of children: Not on file   Years of education: Not on file   Highest education level: Bachelor's degree (e.g., BA, AB, BS)  Occupational History   Not on file  Tobacco Use   Smoking status: Former    Current packs/day: 0.00    Average packs/day: 1 pack/day for 10.0 years (10.0 ttl pk-yrs)    Types: Cigarettes    Start date: 09/01/1966    Quit date: 09/01/1976    Years since quitting: 47.6   Smokeless tobacco: Never  Vaping Use   Vaping status: Never Used  Substance and Sexual Activity   Alcohol use: Yes    Alcohol/week: 10.0 standard drinks of alcohol    Types: 10 Shots of liquor per week    Comment: 1-2 shots of jack daniels a day.   Drug use: No   Sexual activity: Yes  Other Topics Concern   Not on file  Social History Narrative   He is a married father of  3, grandfather and 2.   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   He completed cardiac rehabilitation but as [place] got out of the habit of exercising.   Social Drivers of Corporate investment banker Strain: Low Risk  (03/12/2024)   Overall Financial Resource Strain (CARDIA)    Difficulty of Paying Living Expenses: Not hard at all  Food Insecurity: No Food Insecurity (03/12/2024)   Hunger Vital Sign    Worried About Running Out of Food in the Last Year: Never true    Ran Out of Food in the Last Year: Never true  Transportation Needs: Unknown (03/12/2024)   PRAPARE - Administrator, Civil Service (Medical): No    Lack of Transportation (Non-Medical): Not on file  Physical Activity: Insufficiently Active (03/12/2024)   Exercise Vital Sign    Days of Exercise per Week: 7 days    Minutes of Exercise per Session: 10 min  Stress: No Stress Concern Present (03/12/2024)   [employer] of Occupational Health - Occupational Stress Questionnaire    Feeling of Stress: Not at all  Social Connections: Moderately Integrated (03/12/2024)   Social Connection and Isolation Panel    Frequency of Communication with Friends and Family: More than three times a week    Frequency of Social Gatherings with Friends and Family: Once a  week    Attends Religious Services: More than 4 times per year    Active Member of Clubs or Organizations: No    Attends Banker Meetings: Not on file    Marital Status: Married  Intimate Partner Violence: Not At Risk (02/25/2023)   Humiliation, Afraid, Rape, and Kick questionnaire    Fear of Current or Ex-Partner: No    Emotionally Abused: No    Physically Abused: No    Sexually Abused: No    Review of Systems    Accelerating  exertional angina associated with dyspnea and radiation to his arms as outlined above.  He denies chest pain, palpitations, dyspnea, pnd, orthopnea, n, v, dizziness, syncope, edema, weight gain, early satiety, melena, bright red blood per rectum, hematuria, weakness, or change in mental status.  All other systems reviewed and negative.  Physical Exam    VS:  BP (!) 130/90   Pulse 69   Ht 5' 9 (1.753 m)   Wt 223 lb 12.8 oz (101.5 kg)   SpO2 95%   BMI 33.05 kg/m  , BMI Body mass index is 33.05 kg/m.          GEN: Well nourished, well developed, in no acute distress. HEENT: normal. Neck: Supple, no JVD, carotid bruits, or masses. Cardiac: RRR, no murmurs, rubs, or gallops. No clubbing, cyanosis, edema.  Radials 2+/PT 2+ and equal bilaterally.  Respiratory:  Respirations regular and unlabored, clear to auscultation bilaterally. GI: Soft, nontender, nondistended, BS + x 4. MS: no deformity or atrophy. Skin: warm and dry, no rash. Neuro:  Strength and sensation are intact. Psych: Normal affect.  Accessory Clinical Findings    ECG personally reviewed by me today - EKG Interpretation Date/Time:  Friday April 08 2024 15:19:48 EDT Ventricular Rate:  69 PR Interval:  188 QRS Duration:  112 QT Interval:  420 QTC Calculation: 450 R Axis:   -38  Text Interpretation: Normal sinus rhythm with sinus arrhythmia Left axis deviation Inferior infarct , age undetermined Confirmed by Vivienne Bruckner [phone]) on 04/08/2024 3:23:31 PM  - no acute changes.  Lab Results  Component Value Date   WBC 5.1 11/09/2023   HGB 13.1 11/09/2023   HCT 39.0 11/09/2023   MCV 94 11/09/2023   PLT 224 11/09/2023   Lab Results  Component Value Date   CREATININE 1.04 11/09/2023   BUN 13 11/09/2023   NA 133 (L) 11/09/2023   K 4.9 11/09/2023   CL 99 11/09/2023   CO2 23 11/09/2023   Lab Results  Component Value Date   ALT 28 11/09/2023   AST 25 11/09/2023   ALKPHOS 79 11/09/2023   BILITOT 0.6  11/09/2023   Lab Results  Component Value Date   CHOL 149 11/06/2022   HDL 46 11/06/2022   LDLCALC 74 11/06/2022   TRIG 169 (H) 11/06/2022   CHOLHDL 3.2 11/06/2022    Lab Results  Component Value Date   HGBA1C 5.9 (H) 11/09/2023   Lab Results  Component Value Date   TSH 2.190 11/09/2023       Assessment & Plan    1.  Unstable angina/CAD: Long history of coronary artery disease status postinitial bare-metal stenting in 1999 followed by multiple PCI's and subsequently CABG x 4 following inferior STEMI in August 2013.  He had recurrent inferior STEMI in November 2014 and required extensive stenting in the vein graft to the RPDA/PL.  Most recent catheterization in March 2024 showed relatively stable anatomy with only mild in-stent restenosis  within the vein graft to the RPDA/PL as well as severe disease in the right posterior AV groove and PL branches.  Medical therapy was recommended and he has been on Ranexa  500 mg twice a day since.  Over the past month, he has had accelerating exertional angina and dyspnea.  Symptoms sometimes radiate to his arms and last night he had an episode occurring after eating barbecue ribs.  He has not had to use any sublingual nitrates.  He is chest pain-free currently.  ECG is without acute ST or T changes.  Discussed options for management.  He will continue Ranexa  1000 mg twice daily, which he increased on his own starting yesterday.  Given history and accelerating symptoms, we mutually agreed to pursue diagnostic catheterization (groin access was used in 2024 due to difficulty with left radial access).  The patient understands that risks include but are not limited to stroke (1 in 1000), death (1 in 1000), kidney failure [usually temporary] (1 in 500), bleeding (1 in 200), allergic reaction [possibly serious] (1 in 200), and agrees to proceed.  He is tentatively scheduled for next week with Dr. Darron.  Continue aspirin , statin, beta-blocker, Plavix , ARB, Praluent,  and Ranexa .  2.  Primary hypertension: Diastolic pressure mildly elevated today.  Continue beta-blocker and ARB.  3.  Hyperlipidemia: On atorvastatin  and Praluent, the latter of which was started about 3 months ago.  Previously on Zetia  but this was discontinued after LDL dropped to 33 following initiation of Praluent (labs followed at [facility]).  4.  Palpitations/PSVT: Quiescent on beta-blocker therapy.  5.  Disposition: CBC and basic metabolic panel today.  Plan for diagnostic catheterization next week.  Follow-up in cardiology clinic approximate 2 weeks post catheterization.  Lonni Meager, NP 04/08/2024, 5:35 PM

## 2024-04-08 NOTE — Patient Instructions (Signed)
 Medication Instructions:  Your physician recommends that you continue on your current medications as directed. Please refer to the Current Medication list given to you today.  *If you need a refill on your cardiac medications before your next appointment, please call your pharmacy*  Lab Work: Your provider would like for you to have following labs drawn today CBC, BMET.   If you have labs (blood work) drawn today and your tests are completely normal, you will receive your results only by: MyChart Message (if you have MyChart) OR A paper copy in the mail If you have any lab test that is abnormal or we need to change your treatment, we will call you to review the results.  Testing/Procedures:  [place] [facility] DEPT OF [place] [place] [address] [place] [address] Dept: [phone] Loc: [phone]  HUTCH RHETT  04/08/2024  You are scheduled for a Cardiac Catheterization on Friday, August 15 with Dr. Deatrice Cage.  1. Please arrive at the [facility]ascular Center Entrance of [facility], [address], [place] [address] at 8:30 AM (This is 1 hour(s) prior to your procedure time).  Proceed to the Check-In Desk directly inside the entrance.  Procedure Parking: Use the entrance off of the [address] [place] [address] side of the hospital. Turn right upon entering and follow the driveway to parking that is directly in front of the Heart & Vascular Center. There is no valet parking available at this entrance, however there is an awning directly in front of the Heart & Vascular Center for drop off/ pick up for patients.  Special note: Every effort is made to have your procedure done on time. Please understand that emergencies sometimes delay scheduled procedures.  2. Diet: Do not eat solid foods after midnight.  Hydration:  You need to be well hydrated before your procedure time.  You may drink clear liquids (ie  water, black coffee, clear juice) up until you leave for the hospital.  On way to hospital, please drink a 16 oz bottle of water.  3. Labs: Drawn on 04/08/2024  CBC, BMET  4. Medication instructions in preparation for your procedure:   Contrast Allergy: No  On the morning of your procedure, take your Aspirin  81 mg and Plavix /Clopidogrel  and any morning medicines.  Do not take Cialis 24 hours prior to your cardiac catheterization with water.  5. Plan to go home the same day, you will only stay overnight if medically necessary. 6. Bring a current list of your medications and current insurance cards. 7. You MUST have a responsible person to drive you home. 8. Someone MUST be with you the first 24 hours after you arrive home or your discharge will be delayed. 9. Please wear clothes that are easy to get on and off and wear slip-on shoes.  Thank you for allowing us  to care for you!   -- [facility]   Follow-Up: At [facility], you and your health needs are our priority.  As part of our continuing mission to provide you with exceptional heart care, our providers are all part of one team.  This team includes your primary Cardiologist (physician) and Advanced Practice Providers or APPs (Physician Assistants and Nurse Practitioners) who all work together to provide you with the care you need, when you need it.  Your next appointment:   3 week(s) after your cardiac catheterization  Provider:   Deatrice Cage,  MD    We recommend signing up for the patient portal called MyChart.  Sign up information is provided on this After Visit Summary.  MyChart is used to connect with patients for Virtual Visits (Telemedicine).  Patients are able to view lab/test results, encounter notes, upcoming appointments, etc.  Non-urgent messages can be sent to your provider as well.   To learn more about what you can do with MyChart, go to [email or web address].

## 2024-04-09 LAB — CBC
Hematocrit: 39.4 % (ref 37.5–51.0)
Hemoglobin: 13.2 g/dL (ref 13.0–17.7)
MCH: 32.2 pg (ref 26.6–33.0)
MCHC: 33.5 g/dL (ref 31.5–35.7)
MCV: 96 fL (ref 79–97)
Platelets: 230 x10E3/uL (ref 150–450)
RBC: 4.1 x10E6/uL — ABNORMAL LOW (ref 4.14–5.80)
RDW: 12.4 % (ref 11.6–15.4)
WBC: 5.4 x10E3/uL (ref 3.4–10.8)

## 2024-04-09 LAB — BASIC METABOLIC PANEL WITH GFR
BUN/Creatinine Ratio: 17 (ref 10–24)
BUN: 20 mg/dL (ref 8–27)
CO2: 20 mmol/L (ref 20–29)
Calcium: 9.2 mg/dL (ref 8.6–10.2)
Chloride: 98 mmol/L (ref 96–106)
Creatinine, Ser: 1.16 mg/dL (ref 0.76–1.27)
Glucose: 111 mg/dL — ABNORMAL HIGH (ref 70–99)
Potassium: 4.3 mmol/L (ref 3.5–5.2)
Sodium: 132 mmol/L — ABNORMAL LOW (ref 134–144)
eGFR: 65 mL/min/1.73 (ref >59)

## 2024-04-11 ENCOUNTER — Ambulatory Visit: Payer: Self-pay | Admitting: Nurse Practitioner

## 2024-04-11 ENCOUNTER — Ambulatory Visit: Admitting: Medical

## 2024-04-14 ENCOUNTER — Telehealth: Payer: Self-pay | Admitting: *Deleted

## 2024-04-14 NOTE — Telephone Encounter (Signed)
 Called to confirm/remind patient of their procedure.   Scheduled for: Left heart cath  [x]  Date 04/15/24  [x]  Time 09:30 am [x]  Arrival time 08:30 am [x]  Location [facility]   [x]  Designated Driver  [x]  Instructions, time, and location reviewed with patient    [x]  H&P within 30 days  [x]  EKG within 30 days  [x]  Orders  [x]  Labs  [x]  GFR 65  [x]  Diet  [x]  Medication instructions reviewed [x]  Hydration reviewed  [x]  Spoke with patient and reviewed all information []  VM Full/unable to leave message  []  Phone not in service

## 2024-04-15 ENCOUNTER — Other Ambulatory Visit: Payer: Self-pay

## 2024-04-15 ENCOUNTER — Ambulatory Visit
Admission: RE | Admit: 2024-04-15 | Discharge: 2024-04-16 | Disposition: A | Discharge status: Home / Self Care | Attending: Cardiovascular Disease | Admitting: Cardiovascular Disease

## 2024-04-15 ENCOUNTER — Encounter: Payer: Self-pay | Admitting: Cardiovascular Disease

## 2024-04-15 ENCOUNTER — Encounter
Admission: RE | Disposition: A | Discharge status: Home / Self Care | Payer: Self-pay | Source: Home / Self Care | Attending: Cardiovascular Disease

## 2024-04-15 DIAGNOSIS — I471 Supraventricular tachycardia, unspecified: Secondary | ICD-10-CM | POA: Insufficient documentation

## 2024-04-15 DIAGNOSIS — Z7982 Long term (current) use of aspirin: Secondary | ICD-10-CM | POA: Diagnosis not present

## 2024-04-15 DIAGNOSIS — R002 Palpitations: Secondary | ICD-10-CM | POA: Diagnosis not present

## 2024-04-15 DIAGNOSIS — Z7902 Long term (current) use of antithrombotics/antiplatelets: Secondary | ICD-10-CM | POA: Insufficient documentation

## 2024-04-15 DIAGNOSIS — I25810 Atherosclerosis of coronary artery bypass graft(s) without angina pectoris: Secondary | ICD-10-CM | POA: Diagnosis not present

## 2024-04-15 DIAGNOSIS — R079 Chest pain, unspecified: Secondary | ICD-10-CM

## 2024-04-15 DIAGNOSIS — T82855A Stenosis of coronary artery stent, initial encounter: Secondary | ICD-10-CM | POA: Insufficient documentation

## 2024-04-15 DIAGNOSIS — E785 Hyperlipidemia, unspecified: Secondary | ICD-10-CM | POA: Insufficient documentation

## 2024-04-15 DIAGNOSIS — I200 Unstable angina: Secondary | ICD-10-CM | POA: Diagnosis present

## 2024-04-15 DIAGNOSIS — I25110 Atherosclerotic heart disease of native coronary artery with unstable angina pectoris: Secondary | ICD-10-CM | POA: Diagnosis present

## 2024-04-15 DIAGNOSIS — Z87891 Personal history of nicotine dependence: Secondary | ICD-10-CM | POA: Diagnosis not present

## 2024-04-15 DIAGNOSIS — I2582 Chronic total occlusion of coronary artery: Secondary | ICD-10-CM | POA: Insufficient documentation

## 2024-04-15 DIAGNOSIS — I10 Essential (primary) hypertension: Secondary | ICD-10-CM | POA: Diagnosis not present

## 2024-04-15 DIAGNOSIS — Z79899 Other long term (current) drug therapy: Secondary | ICD-10-CM | POA: Insufficient documentation

## 2024-04-15 HISTORY — PX: CORONARY STENT INTERVENTION: CATH118234

## 2024-04-15 HISTORY — PX: LEFT HEART CATH AND CORS/GRAFTS ANGIOGRAPHY: CATH118250

## 2024-04-15 SURGERY — LEFT HEART CATH AND CORS/GRAFTS ANGIOGRAPHY
Anesthesia: Moderate Sedation

## 2024-04-15 MED ORDER — CARVEDILOL 6.25 MG PO TABS
6.2500 mg | ORAL_TABLET | Freq: Every day | ORAL | Status: DC
  Administered 2024-04-16: 6.25 mg via ORAL
  Filled 2024-04-15: qty 1

## 2024-04-15 MED ORDER — MIDAZOLAM HCL 2 MG/2ML IJ SOLN
INTRAMUSCULAR | Status: DC | PRN
Start: 2024-04-15 — End: 2024-04-15
  Administered 2024-04-15 (×2): 1 mg via INTRAVENOUS

## 2024-04-15 MED ORDER — ONDANSETRON HCL 4 MG/2ML IJ SOLN
4.0000 mg | Freq: Four times a day (QID) | INTRAMUSCULAR | Status: DC | PRN
Start: 2024-04-15 — End: 2024-04-16

## 2024-04-15 MED ORDER — CARVEDILOL 6.25 MG PO TABS: 6.2500 mg | ORAL_TABLET | ORAL | Status: DC

## 2024-04-15 MED ORDER — SODIUM CHLORIDE 0.9 % IV SOLN
250.0000 mL | INTRAVENOUS | Status: DC | PRN
  Administered 2024-04-15: 250 mL via INTRAVENOUS

## 2024-04-15 MED ORDER — FREE WATER: 500.0000 mL | Freq: Once | Status: DC

## 2024-04-15 MED ORDER — NITROGLYCERIN 0.4 MG SL SUBL: 0.4000 mg | SUBLINGUAL_TABLET | SUBLINGUAL | Status: DC | PRN

## 2024-04-15 MED ORDER — IRBESARTAN 150 MG PO TABS
300.0000 mg | ORAL_TABLET | Freq: Every day | ORAL | Status: DC
  Administered 2024-04-15: 300 mg via ORAL
  Filled 2024-04-15: qty 2

## 2024-04-15 MED ORDER — SODIUM CHLORIDE 0.9 % IV SOLN
250.0000 mL | INTRAVENOUS | Status: DC | PRN
Start: 2024-04-15 — End: 2024-04-16

## 2024-04-15 MED ORDER — IOHEXOL 300 MG/ML  SOLN
INTRAMUSCULAR | Status: DC | PRN
  Administered 2024-04-15: 171 mL

## 2024-04-15 MED ORDER — SODIUM CHLORIDE 0.9 % IV SOLN: 250.0000 mL | INTRAVENOUS | Status: DC | PRN

## 2024-04-15 MED ORDER — SODIUM CHLORIDE 0.9 % IV SOLN: INTRAVENOUS | Status: AC

## 2024-04-15 MED ORDER — FENTANYL CITRATE (PF) 100 MCG/2ML IJ SOLN
INTRAMUSCULAR | Status: DC | PRN
  Administered 2024-04-15: 25 ug via INTRAVENOUS

## 2024-04-15 MED ORDER — CLOPIDOGREL BISULFATE 75 MG PO TABS
ORAL_TABLET | ORAL | Status: AC
Start: 2024-04-15 — End: 2024-04-15
  Filled 2024-04-15: qty 4

## 2024-04-15 MED ORDER — SODIUM CHLORIDE 0.9% FLUSH: 3.0000 mL | INTRAVENOUS | Status: DC | PRN

## 2024-04-15 MED ORDER — HEPARIN (PORCINE) IN NACL 1000-0.9 UT/500ML-% IV SOLN
INTRAVENOUS | Status: DC | PRN
  Administered 2024-04-15: 500 mL

## 2024-04-15 MED ORDER — ACETAMINOPHEN 325 MG PO TABS: 650.0000 mg | ORAL_TABLET | ORAL | Status: DC | PRN

## 2024-04-15 MED ORDER — HEPARIN SODIUM (PORCINE) 1000 UNIT/ML IJ SOLN
INTRAMUSCULAR | Status: DC | PRN
  Administered 2024-04-15: 2000 [IU] via INTRAVENOUS
  Administered 2024-04-15: 10000 [IU] via INTRAVENOUS

## 2024-04-15 MED ORDER — CLOPIDOGREL BISULFATE 75 MG PO TABS
ORAL_TABLET | ORAL | Status: DC | PRN
  Administered 2024-04-15: 300 mg via ORAL

## 2024-04-15 MED ORDER — SODIUM CHLORIDE 0.9% FLUSH
3.0000 mL | INTRAVENOUS | Status: DC | PRN
Start: 2024-04-15 — End: 2024-04-16

## 2024-04-15 MED ORDER — CLOPIDOGREL BISULFATE 75 MG PO TABS
75.0000 mg | ORAL_TABLET | Freq: Every day | ORAL | Status: DC
  Administered 2024-04-16: 75 mg via ORAL
  Filled 2024-04-15: qty 1

## 2024-04-15 MED ORDER — SODIUM CHLORIDE 0.9% FLUSH: 3.0000 mL | Freq: Two times a day (BID) | INTRAVENOUS | Status: DC

## 2024-04-15 MED ORDER — FENTANYL CITRATE (PF) 100 MCG/2ML IJ SOLN
INTRAMUSCULAR | Status: AC
  Filled 2024-04-15: qty 2

## 2024-04-15 MED ORDER — VITAMIN C 500 MG PO TABS
1000.0000 mg | ORAL_TABLET | Freq: Every day | ORAL | Status: DC
  Administered 2024-04-16: 1000 mg via ORAL
  Filled 2024-04-15: qty 2

## 2024-04-15 MED ORDER — MIDAZOLAM HCL 2 MG/2ML IJ SOLN
INTRAMUSCULAR | Status: AC
  Filled 2024-04-15: qty 2

## 2024-04-15 MED ORDER — SODIUM CHLORIDE 0.9% FLUSH
3.0000 mL | Freq: Two times a day (BID) | INTRAVENOUS | Status: DC
  Administered 2024-04-15 – 2024-04-16 (×3): 3 mL via INTRAVENOUS

## 2024-04-15 MED ORDER — SODIUM CHLORIDE 0.9% FLUSH
3.0000 mL | Freq: Two times a day (BID) | INTRAVENOUS | Status: DC
  Administered 2024-04-15: 3 mL via INTRAVENOUS

## 2024-04-15 MED ORDER — LIDOCAINE HCL 1 % IJ SOLN
INTRAMUSCULAR | Status: AC
Start: 2024-04-15 — End: 2024-04-15
  Filled 2024-04-15: qty 20

## 2024-04-15 MED ORDER — CARVEDILOL 12.5 MG PO TABS
12.5000 mg | ORAL_TABLET | Freq: Every day | ORAL | Status: DC
  Administered 2024-04-15: 12.5 mg via ORAL
  Filled 2024-04-15: qty 1

## 2024-04-15 MED ORDER — PANTOPRAZOLE SODIUM 40 MG PO TBEC
40.0000 mg | DELAYED_RELEASE_TABLET | Freq: Every day | ORAL | Status: DC
  Administered 2024-04-16: 40 mg via ORAL
  Filled 2024-04-15: qty 1

## 2024-04-15 MED ORDER — TRAZODONE HCL 50 MG PO TABS
25.0000 mg | ORAL_TABLET | Freq: Every evening | ORAL | Status: DC | PRN
  Administered 2024-04-15: 25 mg via ORAL
  Filled 2024-04-15: qty 1

## 2024-04-15 MED ORDER — RANOLAZINE ER 500 MG PO TB12: 500.0000 mg | ORAL_TABLET | Freq: Two times a day (BID) | ORAL | Status: DC

## 2024-04-15 MED ORDER — HEPARIN (PORCINE) IN NACL 2000-0.9 UNIT/L-% IV SOLN
INTRAVENOUS | Status: DC | PRN
  Administered 2024-04-15: 1000 mL

## 2024-04-15 MED ORDER — RANOLAZINE ER 500 MG PO TB12: 1000.0000 mg | ORAL_TABLET | Freq: Every day | ORAL | Status: DC

## 2024-04-15 MED ORDER — HEPARIN SODIUM (PORCINE) 1000 UNIT/ML IJ SOLN
INTRAMUSCULAR | Status: AC
  Filled 2024-04-15: qty 10

## 2024-04-15 MED ORDER — ATORVASTATIN CALCIUM 20 MG PO TABS
20.0000 mg | ORAL_TABLET | Freq: Every day | ORAL | Status: DC
  Administered 2024-04-16: 20 mg via ORAL
  Filled 2024-04-15: qty 1

## 2024-04-15 MED ORDER — RANOLAZINE ER 500 MG PO TB12
500.0000 mg | ORAL_TABLET | Freq: Every day | ORAL | Status: DC
  Administered 2024-04-16: 500 mg via ORAL
  Filled 2024-04-15: qty 1

## 2024-04-15 MED ORDER — ASPIRIN 81 MG PO CHEW: 81.0000 mg | CHEWABLE_TABLET | ORAL | Status: DC

## 2024-04-15 MED ORDER — ASPIRIN 81 MG PO TBEC
81.0000 mg | DELAYED_RELEASE_TABLET | Freq: Every day | ORAL | Status: DC
  Administered 2024-04-16: 81 mg via ORAL
  Filled 2024-04-15: qty 1

## 2024-04-15 MED ORDER — HEPARIN (PORCINE) IN NACL 1000-0.9 UT/500ML-% IV SOLN
INTRAVENOUS | Status: AC
  Filled 2024-04-15: qty 500

## 2024-04-15 MED ORDER — NITROGLYCERIN 1 MG/10 ML FOR IR/CATH LAB
INTRA_ARTERIAL | Status: DC | PRN
Start: 2024-04-15 — End: 2024-04-15
  Administered 2024-04-15: 200 ug

## 2024-04-15 MED ORDER — PSYLLIUM 95 % PO PACK
1.0000 | PACK | Freq: Every day | ORAL | Status: DC
  Filled 2024-04-15 (×2): qty 1

## 2024-04-15 SURGICAL SUPPLY — 22 items
BALLOON MINITREK RX 2.0X15 (BALLOONS) IMPLANT
BALLOON ~~LOC~~ EUPHORA RX 3.0X20 (BALLOONS) IMPLANT
CATH INFINITI 5 FR IM (CATHETERS) IMPLANT
CATH INFINITI 5 FR MPA2 (CATHETERS) IMPLANT
CATH INFINITI 5FR ANG PIGTAIL (CATHETERS) IMPLANT
CATH INFINITI 5FR JL4 (CATHETERS) IMPLANT
CATH INFINITI JR4 5F (CATHETERS) IMPLANT
CATH VISTA GUIDE 6FR XB3.5 EPK (CATHETERS) IMPLANT
CATH VISTA GUIDE 6FR XB4 MULPK (CATHETERS) IMPLANT
DEVICE CLOSURE MYNXGRIP 6/7F (Vascular Products) IMPLANT
KIT ENCORE 26 ADVANTAGE (KITS) IMPLANT
KIT MICROPUNCTURE VSI 5F STIFF (SHEATH) IMPLANT
PACK CARDIAC CATH (CUSTOM PROCEDURE TRAY) ×2 IMPLANT
SET ATX-X65L (MISCELLANEOUS) IMPLANT
SHEATH AVANTI 5FR X 11CM (SHEATH) IMPLANT
SHEATH AVANTI 6FR X 11CM (SHEATH) IMPLANT
STATION PROTECTION PRESSURIZED (MISCELLANEOUS) IMPLANT
STENT ONYX FRONTIER 2.75X30 (Permanent Stent) IMPLANT
TUBING CIL FLEX 10 FLL-RA (TUBING) IMPLANT
WIRE EMERALD 3MM-J .035X260CM (WIRE) IMPLANT
WIRE GUIDERIGHT .035X150 (WIRE) IMPLANT
WIRE RUNTHROUGH .014X180CM (WIRE) IMPLANT

## 2024-04-15 NOTE — Progress Notes (Signed)
 Patient stable. Bedside handoff given to [facility].

## 2024-04-15 NOTE — Interval H&P Note (Signed)
 History and Physical Interval Note:  04/15/2024 9:49 AM  Jonathan Escobar  has presented today for surgery, with the diagnosis of L Cath w graft    Chest pain.  The various methods of treatment have been discussed with the patient and family. After consideration of risks, benefits and other options for treatment, the patient has consented to  Procedure(s): LEFT HEART CATH AND CORS/GRAFTS ANGIOGRAPHY (Left) as a surgical intervention.  The patient's history has been reviewed, patient examined, no change in status, stable for surgery.  I have reviewed the patient's chart and labs.  Questions were answered to the patient's satisfaction.     Edge Mauger

## 2024-04-15 NOTE — Discharge Instructions (Signed)
 No driving for 1 week. No lifting over 5 lbs for 1 week. No sexual activity for 1 week. Keep procedure site clean & dry. If you notice increased pain, swelling, bleeding or pus, call/return!  You may shower, but no soaking baths/hot tubs/pools for 1 week.

## 2024-04-16 DIAGNOSIS — T82855A Stenosis of coronary artery stent, initial encounter: Secondary | ICD-10-CM | POA: Diagnosis not present

## 2024-04-16 DIAGNOSIS — I200 Unstable angina: Secondary | ICD-10-CM

## 2024-04-16 NOTE — Discharge Summary (Signed)
 Discharge Summary    Patient ID: Jonathan Escobar  MRN: [id number], DOB/AGE: 1946/08/13 78 y.o.  Admit Date: 04/15/2024 Discharge Date: 04/16/2024  Primary Care Provider: Justus Leita DEL, MD Primary Cardiologist: Dr. Darron, MD  Discharge Diagnoses    Principal Problem:   Unstable angina [facility]) Active Problems:   Coronary artery disease involving native coronary artery of native heart with unstable angina pectoris (HCC)   Allergies Allergies  Allergen Reactions   Norvasc  [Amlodipine  Besylate]     Dizzy   Ace Inhibitors Other (See Comments)    Cough    Crestor  [Rosuvastatin ] Other (See Comments)    Muscle pain     History of Present Illness     78 year old male with history of CAD status post prior PCI and four-vessel CABG in 2013 in the setting of an inferior ST elevation MI with recurrent inferior STEMI in 07/2013 status post PCI to the SVG to PDA with staged PCI to the anastomotic SVG to diagonal, diastolic dysfunction, dilated aortic root, HTN, HLD, obesity, and palpitations/PSVT who presented to [facility] on 04/15/2024 for planned LHC.  He has extensive cardiac history that dates back to 1999 at which time he underwent PCI/BMS to the proximal RCA.  He had brachytherapy in 2000 and in 2003.  This was followed by repeat PCI with Taxus DES in 2005 for ISR.  He was admitted with an inferior STEMI on 04/25/2012 with emergent LHC with 95% ISR followed by 5% stenosis post stent with lesions in the PL and PDA.  There was also 70% stenosis in the mid LAD.  He underwent aspiration thrombectomy and PTCA and was sent for urgent CABG with LIMA to LAD, SVG to diagonal SVG to PDA and PLA.  He was readmitted with an inferior STEMI in 07/2013 with emergent LHC showing an occluded SVG to PDA.  He underwent extensive PCI (full metal jacket) of the SVG to PDA followed by staged PCI of the anastomotic SVG to diagonal lesion.  He has not had any significant ischemic cardiac events since.  He underwent  treadmill nuclear stress testing through the [facility] in 2017 which showed no evidence of significant ischemia with a normal EF.  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  Zio patch in 07/2018 demonstrated normal sinus rhythm with an average heart rate of 61 bpm.  There were 3 episodes of SVT with the fastest interval lasting 5 beats with a maximum rate of 171 bpm.  Frequent PACs were noted with a burden of 16%.  Very rare PVCs.  Repeat Zio patch in 06/2020 showed a predominant rhythm of sinus with an average rate of 59 bpm with 10 runs of SVT lasting up to 20 beats and frequent PACs with a burden of 21%.  Echo in 07/2020 showed an EF of 50 to 55%, no regional wall motion abnormalities, low normal RV systolic function with mildly enlarged ventricular cavity size, normal RVSP, mild biatrial enlargement, mild mitral regurgitation, dilated aortic root measuring 42 mm, and an estimated right atrial pressure of 3 mmHg.  Echo in 08/2021 showed an EF of 55%, no regional wall motion abnormalities, grade 1 diastolic dysfunction, mildly dilated left atrium, and mild dilatation of the aortic root measuring 42 mm.  He underwent stress testing in 10/2022 for recurrent angina that was notable for 1 mm ST segment depression on EKG during exercise and moderate ischemia in the basal to mid inferolateral region.  Subsequent LHC in 10/2022  showed patent LIMA to LAD, occluded vein graft to diagonal, and patent vein graft to R PDA/PL with 40% proximal in-stent restenosis.  There was severe disease in the right posterior AV groove and PL branches with medical therapy recommended.  He was placed on ranolazine  500 mg twice daily.  He was seen earlier this month noting progressive exertional angina that was occurring with minimal activity and associated with dyspnea, lasting 5 to 10 minutes and resolving with rest.  He had not taken any sublingual nitrates.  In this setting diagnostic  cath was recommended.   Hospital Course     Consultants: cardiac rehab   He presented to [facility] on 04/15/2024 for planned LHC that showed severe underlying three-vessel CAD with patent LIMA to LAD and SVG to RCA.  Chronically occluded SVG to OM/diagonal.  Stable moderate in-stent restenosis in the proximal portion of SVG to RCA.  There was new severe disease in the mid LCx with hazy appearance and TIMI II flow highly suggestive of recent plaque rupture.  He underwent successful PCI/DES to the mid LCx.  LVEDP mildly elevated at 15 mmHg.  Lifelong DAPT was recommended.  Continued monitoring of the in-stent restenosis of the SVG to RCA was recommended.  Postprocedure, he remained hemodynamically stable with blood pressure control.  He has ambulated without symptoms of angina.  Angina noted prior to presentation has resolved.   The patient's right femoral arteriotomy site has been examined is healing well without issues at this time. The patient has been seen by Dr. Darliss and felt to be stable for discharge today. All follow up appointments have been made. Discharge medications are listed below. Prescriptions have been reviewed with the patient and sent in to their pharmacy, if indicated.  _____________  Discharge Vitals Blood pressure (!) 140/83, pulse (!) 53, temperature 98 F (36.7 C), temperature source Oral, resp. rate (!) 22, height 5' 9 (1.753 m), weight 100.3 kg, SpO2 100%.  Filed Weights   04/15/24 0908  Weight: 100.3 kg   GEN: No acute distress.   Neck: No JVD.  Cardiac: Mildly bradycardic, no murmurs, rubs, or gallops.  Right femoral arteriotomy site without bleeding, bruising, swelling, warmth, erythema, or TTP. Non bruit. Dressing clean, dry and intact.   Respiratory: Clear to auscultation bilaterally.  GI: Soft, nontender, non-distended.   MS: No edema; No deformity.  Neuro:  Alert and oriented x 3; Nonfocal.  Psych: Normal affect.   EKG: Is overall poor quality and  incomplete demonstrating sinus bradycardia with PACs, 54 bpm and nonspecific ST-T changes Telemetry: sinus bradycardia with occasional isolated PACs, 50s bpm    Multivessel CAD status post CABG status post subsequent PCI with  - Currently, without symptoms of angina or cardiac decompensation  - Status post successful PCI/DES to the native mid LCx  - Continue lifelong DAPT with aspirin  81 mg and clopidogrel  75 mg  - Aggressive risk factor modification and secondary prevention including continuation of atorvastatin  20 mg, PTA Praluent, ranolazine  500 mg in the morning with 1000 mg in the evening   - Post-cath instructions  - Cardiac rehab   HTN:  - Blood pressure is well controlled  - PTA carvedilol  6.25 mg in the morning and 12.5 mg in the evening, valsartan  320 mg daily   HLD:  - LDL previously 33 following the initiation of Praluent leading to ezetimibe  to be discontinued  - PTA atorvastatin  and Praluent  - Followed at the [facility]   Palpitations/PSVT:  - Quiescent on beta-blocker  Dilated aortic root:  - Stable on imaging  - Follow-up in the office   Labs & Radiologic Studies    CBC No results for input(s): WBC, NEUTROABS, HGB, HCT, MCV, PLT in the last 72 hours. Basic Metabolic Panel No results for input(s): NA, K, CL, CO2, GLUCOSE, BUN, CREATININE, CALCIUM , MG, PHOS in the last 72 hours. Liver Function Tests No results for input(s): AST, ALT, ALKPHOS, BILITOT, PROT, ALBUMIN  in the last 72 hours. No results for input(s): LIPASE, AMYLASE in the last 72 hours. Cardiac Enzymes No results for input(s): CKTOTAL, CKMB, CKMBINDEX, TROPONINI in the last 72 hours. BNP Invalid input(s): POCBNP D-Dimer No results for input(s): DDIMER in the last 72 hours. Hemoglobin A1C No results for input(s): HGBA1C in the last 72 hours. Fasting Lipid Panel No results for input(s): CHOL, HDL, LDLCALC, TRIG, CHOLHDL,  LDLDIRECT in the last 72 hours. Thyroid  Function Tests No results for input(s): TSH, T4TOTAL, T3FREE, THYROIDAB in the last 72 hours.  Invalid input(s): FREET3 _____________    Diagnostic Studies/Procedures   LHC 04/15/2024:   Prox LAD to Mid LAD lesion is 90% stenosed.   Mid LAD lesion is 100% stenosed.   Prox RCA to Mid RCA lesion is 100% stenosed.   Ost RCA to Prox RCA lesion is 80% stenosed.   Origin lesion is 100% stenosed.   Prox Graft to Dist Graft lesion is 10% stenosed.   2nd Diag lesion is 100% stenosed.   RPAV-1 lesion is 60% stenosed.   RPAV-2 lesion is 90% stenosed.   Prox Cx to Dist Cx lesion is 99% stenosed.   Dist LAD lesion is 40% stenosed.   Origin to Prox Graft lesion is 50% stenosed.   A drug-eluting stent was successfully placed using a STENT ONYX FRONTIER 2.75X30.   Post intervention, there is a 0% residual stenosis.   SVG.   LIMA and is normal in caliber.   The graft exhibits no disease.   1.  Severe underlying three-vessel coronary artery disease with patent LIMA to LAD and SVG to RCA.  Chronically occluded SVG to OM/diagonal.  Stable moderate in-stent restenosis in the proximal portion of the SVG to RCA.  There is new severe disease in the mid left circumflex with hazy appearance and TIMI II flow highly suggestive of recent plaque rupture. 2.  Left ventricular angiography was not performed.  LVEDP was mildly elevated at 15 mmHg. 3.  Successful angioplasty and drug-eluting stent placement to the mid left circumflex.   Recommendations: Continue lifelong dual antiplatelet therapy. Aggressive treatment of risk factors. Will continue to monitor the in-stent restenosis in the SVG to the RCA for now. _____________  Disposition   Pt is being discharged home today in good condition.  Follow-up Plans & Appointments     Follow-up Information     Vivienne Lonni Ingle, NP Follow up on 05/20/2024.   Specialties: Cardiology, Radiology Why:  Appointment time 1:30 PM. Please arrive 20 minutes prior to appointment start time. Contact information: [address] [place] [address] [phone]                Discharge Instructions     AMB Referral to Cardiac Rehabilitation - Phase II   Complete by: As directed    Diagnosis: Coronary Stents   After initial evaluation and assessments completed: Virtual Based Care may be provided alone or in conjunction with Phase 2 Cardiac Rehab based on patient barriers.: Yes   Intensive Cardiac Rehabilitation (ICR) [facility] location only  OR Traditional Cardiac Rehabilitation (TCR) *If criteria for ICR are not met will enroll in TCR [facility] only): Yes   Diet - low sodium heart healthy   Complete by: As directed    Increase activity slowly   Complete by: As directed        Discharge Medications   Allergies as of 04/16/2024       Reactions   Norvasc  [amlodipine  Besylate]    Dizzy   Ace Inhibitors Other (See Comments)   Cough   Crestor  [rosuvastatin ] Other (See Comments)   Muscle pain        Medication List     STOP taking these medications    fluorouracil  5 % cream Commonly known as: EFUDEX        TAKE these medications    acetaminophen  325 MG tablet Commonly known as: TYLENOL  Take 2 tablets (650 mg total) by mouth every 4 (four) hours as needed for headache or mild pain.   aspirin  81 MG tablet Take 81 mg by mouth daily.   atorvastatin  20 MG tablet Commonly known as: LIPITOR  Take 1 tablet (20 mg total) by mouth daily.   carvedilol  6.25 MG tablet Commonly known as: COREG  Take 6.25-12.5 mg by mouth See admin instructions. Take 6.25mg  (1 tablet) by mouth in the morning and 12.5mg  (2 tablets) in the evening.   cetirizine 10 MG tablet Commonly known as: ZYRTEC Take 10 mg by mouth daily. PM   clopidogrel  75 MG tablet Commonly known as: PLAVIX  TAKE 1 TABLET (75 MG TOTAL) BY MOUTH DAILY WITH BREAKFAST.   ketoconazole  2 % cream Commonly known as:  NIZORAL  Apply 1 Application topically 2 (two) times daily. What changed:  when to take this reasons to take this   MULTIVITAMIN PO Take 1 tablet by mouth daily.   Nitrostat  0.4 MG SL tablet Generic drug: nitroGLYCERIN  PLACE 1 TABLET UNDER THE TONGUE EVERY 5 MINUTES X 3 DOSES AS NEEDED FOR CHEST PAIN.   pantoprazole  40 MG tablet Commonly known as: PROTONIX  Take 1 tablet (40 mg total) by mouth daily at 6 (six) AM.   Praluent 75 MG/ML Soaj Generic drug: Alirocumab Inject 75 mg into the skin every 14 (fourteen) days.   ranolazine  500 MG 12 hr tablet Commonly known as: RANEXA  Take 500 mg by mouth 2 (two) times daily. What changed:  how much to take when to take this additional instructions   tadalafil 20 MG tablet Commonly known as: CIALIS Take 10 mg by mouth daily as needed for erectile dysfunction.   traZODone  50 MG tablet Commonly known as: DESYREL  Take 25 mg by mouth at bedtime as needed for sleep.   valsartan  320 MG tablet Commonly known as: DIOVAN  TAKE 1 TABLET BY MOUTH EVERY DAY   vitamin C  1000 MG tablet Take 1,000 mg by mouth daily.          Aspirin  prescribed at discharge?  Yes High Intensity Statin Prescribed? (Lipitor  40-80mg  or Crestor  20-40mg ): Yes Beta Blocker Prescribed? No: bradycardia For EF <40%, was ACEI/ARB Prescribed? Yes; EF > 40% ADP Receptor Inhibitor Prescribed? (i.e. Plavix  etc.-Includes Medically Managed Patients): Yes For EF <40%, Aldosterone Inhibitor Prescribed? No: EF > 40% Was EF assessed during THIS hospitalization? No: assessed in 2024 Was Cardiac Rehab II ordered? (Included Medically managed Patients): Yes   Outstanding Labs/Studies   None  Duration of Discharge Encounter   Greater than 30 minutes including physician time.  Signed, Bernardino CHRISTELLA Bring, PA-C [facility] Pager: [phone] 04/16/2024, 9:34 AM

## 2024-04-18 ENCOUNTER — Encounter: Payer: Self-pay | Admitting: Cardiovascular Disease

## 2024-04-18 LAB — POCT ACTIVATED CLOTTING TIME
Activated Clotting Time: 239 s
Activated Clotting Time: 291 s

## 2024-04-21 ENCOUNTER — Ambulatory Visit: Admitting: Nurse Practitioner

## 2024-05-20 ENCOUNTER — Encounter: Payer: Self-pay | Admitting: Nurse Practitioner

## 2024-05-20 ENCOUNTER — Ambulatory Visit: Attending: Nurse Practitioner | Admitting: Nurse Practitioner

## 2024-05-20 VITALS — BP 120/70 | HR 57 | Ht 69.0 in | Wt 226.2 lb

## 2024-05-20 DIAGNOSIS — I471 Supraventricular tachycardia, unspecified: Secondary | ICD-10-CM | POA: Diagnosis not present

## 2024-05-20 DIAGNOSIS — E785 Hyperlipidemia, unspecified: Secondary | ICD-10-CM

## 2024-05-20 DIAGNOSIS — I2510 Atherosclerotic heart disease of native coronary artery without angina pectoris: Secondary | ICD-10-CM

## 2024-05-20 DIAGNOSIS — I10 Essential (primary) hypertension: Secondary | ICD-10-CM | POA: Diagnosis not present

## 2024-05-20 NOTE — Progress Notes (Signed)
 Office Visit    Patient Name: Jonathan Escobar Date of Encounter: 05/20/2024  Primary Care Provider:  Justus Leita DEL, MD Primary Cardiologist:  Deatrice Cage, MD  Cardiology APP:  Vivienne Lonni Ingle, NP   Chief Complaint    78 y.o. male with a history of CAD, hypertension, hyperlipidemia, diastolic dysfunction, obesity, palpitations, and PSVT presenting for follow up after elective cath for CAD s/p recent PCI.  Past Medical History   Subjective   Past Medical History:  Diagnosis Date   CAD (coronary artery disease), autologous vein bypass graft 07/2013   Inf STEMI - PCI to SVG-RPDA:    CAD in native artery - prior Inferior MI - RCA PCI; 04/2012 - Inferior STEMI - RCA occluded (unable to dilate lesion adequately) + LAD & D1 disease --> referred for CABG 04/07/2012   CAD S/P percutaneous coronary angioplasty 1999   BMS to prox RCA 1999; Brachytherapy ~2003, followed by DES  PCI (Taxus) for ISR.   Coronary stent restenosis due to progression of disease 2000; 2005   (While Still In [place] ) Status post Brachytherapy to RCA ISR - 2000; Redo PCI with DES 2005   Diverticulitis    Essential hypertension    CONTROLLED ON MEDS   GERD (gastroesophageal reflux disease)    Grade I diastolic dysfunction    History of GI bleed     while on Plavix    History of viral meningitis    following GI bleed    History of: ST elevation myocardial infarction (STEMI) of inferior wall, subsequent episode of care 04/25/2012   RCA: 95% ISR followed by 100% stenosis post-stent with significant RPL lesions; LAD: Tandem 80 and 70% lesions involving D1 (1, 1, 1) --> initial angioplasty to RCA aspiration thrombectomy --> urgent CABG;   Hyperlipidemia LDL goal <70    Non-recurrent unilateral inguinal hernia without obstruction or gangrene    Obesity (BMI 30.0-34.9)    Plantar fasciitis    left heel   Right inguinal hernia 07/02/2016   S/P CABG x 4: LIMA-LAD, seq SVG-PD-PLA; SVG-diag. 04/28/2012    S/P coronary artery stent placement Nov-Dec 2014   a) Inf STEMIL 11/28/'14 --> extensive (full metal Jacket) of SVG-RPDA Promus DES - 3 mm x 38 mm, 3 mm x 38 mm, 3 mm x 20 mm, 2.75 mm x 38 mm -- all postdilated to ~3.3 mm;; b) 12/1/'14: staged PCI to distal SVG-diagonal: Xience Xpedition 2.5 mm x 20 mm (2.66 mm)   S/P drug eluting coronary stent placement 04/2024   a. 04/2024 PCI:  LM min irregs< LAD 90p/m, 160m, 40d, D2 100, LCX 99p/d (2.75x30 Onyx Frontier DES), RCA 80ost/p, 100p/m, RPAV1 60, RPAV2 90, VG-.D2 100, VG->RPDA 50ost/p ISR, 10p/d ISR, Y graft to RPAV ok, LIMA->LAD ok.   ST elevation myocardial infarction (STEMI) of inferior wall, initial episode of care - s/p PTCA of 95% ISR to 70% and 100% distal stent occlusion to ~40% (8/25) 04/25/2012   ST elevation myocardial infarction (STEMI) of inferior wall, subsequent episode of care (HCC) 07/29/2013   Diffuse near occlusion of SVG-rPDA, 90% SVG-Diag   STEMI 07/29/13- successful complex full metal jacket stenting of the SVG-PDA/PLA -07/29/2013, staged SVG-Dx PCI 12/01 07/29/2013   Supraventricular tachycardia (HCC)    Trigger finger, left little finger    Past Surgical History:  Procedure Laterality Date   CARDIAC CATHETERIZATION  04/28/12   RCA: 95% ISR followed by 100% stenosis post-stent with significant RPL lesions; LAD: Tandem 80 and 70% lesions involving  D1 (1, 1, 1) --> initial angioplasty to RCA aspiration thrombectomy --> urgent CABG;   CATARACT EXTRACTION W/PHACO Right 07/21/2023   Procedure: CATARACT EXTRACTION PHACO AND INTRAOCULAR LENS PLACEMENT (IOC) RIGHT  CLAREON VIVITY TORIC  6.70  00:40.5;  Surgeon: Jaye Fallow, MD;  Location: [facility];  Service: Ophthalmology;  Laterality: Right;   CATARACT EXTRACTION W/PHACO Left 08/11/2023   Procedure: CATARACT EXTRACTION PHACO AND INTRAOCULAR LENS PLACEMENT (IOC) LEFT CLAREON VIVITY TORIC  5.16  00:38.5;  Surgeon: Jaye Fallow, MD;  Location: [facility];   Service: Ophthalmology;  Laterality: Left;   CORONARY ARTERY BYPASS GRAFT  04/28/2012   Procedure: CORONARY ARTERY BYPASS GRAFTING (CABG);  Surgeon: Dorise MARLA Fellers, MD;  Location: [facility] OR;  Service: Open Heart Surgery;  Laterality: N/A;   CORONARY STENT INTERVENTION N/A 04/15/2024   Procedure: CORONARY STENT INTERVENTION;  Surgeon: Darron Deatrice LABOR, MD;  Location: [facility];  Service: Cardiovascular;  Laterality: N/A;   HERNIA REPAIR Left    INGUINAL HERNIA REPAIR Right 08/06/2016   Procedure: HERNIA REPAIR INGUINAL ADULT;  Surgeon: Carlin Pastel, MD;  Location: [facility];  Service: General;  Laterality: Right;   LEFT AND RIGHT HEART CATHETERIZATION WITH CORONARY ANGIOGRAM N/A 04/25/2012   Procedure: LEFT AND RIGHT HEART CATHETERIZATION WITH CORONARY ANGIOGRAM;  Surgeon: Alm LELON Clay, MD;  Location: [facility] C[facility] LAB;  Service: Cardiovascular;  Laterality: N/A;   LEFT HEART CATH AND CORONARY ANGIOGRAPHY Left 11/25/2022   Procedure: LEFT HEART CATH AND CORONARY ANGIOGRAPHY;  Surgeon: Darron Deatrice LABOR, MD;  Location: [facility];  Service: Cardiovascular;  Laterality: Left;   LEFT HEART CATH AND CORS/GRAFTS ANGIOGRAPHY Left 04/15/2024   Procedure: LEFT HEART CATH AND CORS/GRAFTS ANGIOGRAPHY;  Surgeon: Darron Deatrice LABOR, MD;  Location: [facility];  Service: Cardiovascular;  Laterality: Left;   LEFT HEART CATHETERIZATION WITH CORONARY/GRAFT ANGIOGRAM  07/29/2013   Procedure: LEFT HEART CATHETERIZATION WITH EL BILE;  Surgeon: Ozell JONETTA Fell, MD;  Location: [facility] CATH LAB;  Service: Cardiovascular; ; 95% stenosis with percent mid occlusion of SVG-RPDA; 90% anastomotic SVG-diagonal, moderate 50% disease in the native circumflex with patent OM 1 OM 2. 80-90% proximal LAD with patent LIMA to LAD , EF 50%   MOHS SURGERY     FOREHEAD   NM MYOVIEW  LTD  Jan 2015   Moderate Inferior infarct (as expected), but no ischemia & EF ~45-50%   PERCUTANEOUS CORONARY  STENT INTERVENTION (PCI-S)  07/29/2013   Procedure: PERCUTANEOUS CORONARY STENT INTERVENTION (PCI-S);  Surgeon: Ozell JONETTA Fell, MD;  Location: [facility] CATH LAB;  Service: Cardiovascular;; PCI to SVG-RPDA: Promus DES - 3 mm x 38 mm, 3 mm x 38 mm, 3 mm x 20 mm, 2.75 mm x 38 mm -- all postdilated to ~3.3 mm;   PERCUTANEOUS CORONARY STENT INTERVENTION (PCI-S) N/A 08/01/2013   Procedure: PERCUTANEOUS CORONARY STENT INTERVENTION (PCI-S);  Surgeon: Alm LELON Clay, MD;  Location: [facility] CATH LAB;  Service: Cardiovascular;staged PCI to distal SVG-diagonal: Xience Xpedition 2.5 mm x 20 mm (2.66 mm)     TRANSTHORACIC ECHOCARDIOGRAM  12/'13; 07/30/2013   a) Post CABG: EF 50-55%, moderate HK of anteroseptal wall, septal dyskinesis/dyssynergy due to poststernotomy;  grade 1 diastolic dysfunction. Mildly dilated LA, mildly reduced RV function-likely due to septal dyssynergy.; b) 11/'14 Post Inferior STEMI: EF 50-55% mild LVH. Mild hypokinesis of the distal lateral, and basal to mid inferior lateral and inferior walls -- consistent with RCA infarct    Allergies  Allergies  Allergen Reactions   Norvasc  [Amlodipine  Besylate]     Dizzy   Ace Inhibitors Other (See Comments)    Cough    Crestor  [Rosuvastatin ] Other (See Comments)    Muscle pain       History of Present Illness      78 y.o. y/o male with a history of CAD, hypertension, hyperlipidemia, diastolic dysfunction, obesity, palpitations, and PSVT.  His cardiac history dates back to 1999, when he required bare-metal stenting to the proximal RCA with subsequent brachytherapy in 2000 and 2003 due to in-stent restenosis followed by redo PCI with Taxus drug-eluting stent placement in 2005, again due to in-stent restenosis. In August 2013, he suffered an inferior STEMI and was found to have a 95% in-stent restenosis within the RCA as well as 70% mid LAD disease.  After aspiration thrombectomy and balloon angioplasty of the RCA, he underwent urgent CABG with placement  of a LIMA to the LAD, sequential vein graft to the PDA and PLA, and a vein graft to the diagonal.  He suffered inferior STEMI again in November 2014 with finding of occluded vein graft to the PDA that required treatment with extensive PCI (full metal jacket) followed by staged intervention of anastomotic lesion and the vein graft to the diagonal.  In early 2024, he had recurrent angina and underwent stress testing notable for 1 mm ST segment depression on ECG during exercise (exercise time of 6 minutes), and moderate ischemia in the basal to mid inferolateral region.  Diagnostic catheterization in March 2024 showed patent LIMA to the LAD, occluded vein graft to the diagonal, and patent vein graft to the RPDA/PL w/ 40% proximal ISR.  There was severe disease in the right posterior AV groove and PL branches and medical therapy was recommended.  He was placed on Ranexa  500 mg twice daily.   In 04/2024, patient was seen for unstable angina with accelerating symptoms.  Ultimately agreed to move forward with an elective cath.  Patient underwent left heart cath showing significant 99% mid left circumflex obstruction, patent LIMA to LAD, patent SVG to RCA, occluded SVG to OM/diagonal. S/p drug-eluting stent to mid left circumflex.  Lifelong DAPT advised.  Since his PCI, he reports feeling well in the weeks after catheterization.  Has been going on half a mile walks 2-3 times a week with his wife. He denies chest pain, palpitations, dyspnea, pnd, orthopnea, n, v, dizziness, syncope, edema, weight gain, or early satiety.    Objective   Home Medications    Current Outpatient Medications  Medication Sig Dispense Refill   acetaminophen  (TYLENOL ) 325 MG tablet Take 2 tablets (650 mg total) by mouth every 4 (four) hours as needed for headache or mild pain.     Alirocumab (PRALUENT) 75 MG/ML SOAJ Inject 75 mg into the skin every 14 (fourteen) days.     Ascorbic Acid  (VITAMIN C ) 1000 MG tablet Take 1,000 mg by mouth  daily.     aspirin  81 MG tablet Take 81 mg by mouth daily.     atorvastatin  (LIPITOR ) 20 MG tablet Take 1 tablet (20 mg total) by mouth daily.     carvedilol  (COREG ) 6.25 MG tablet Take 6.25-12.5 mg by mouth See admin instructions. Take 6.25mg  (1 tablet) by mouth in the morning and 12.5mg  (2 tablets) in the evening.     cetirizine (ZYRTEC) 10 MG tablet Take 10 mg by mouth daily. PM     clopidogrel  (PLAVIX ) 75 MG tablet TAKE 1 TABLET (75 MG TOTAL) BY MOUTH  DAILY WITH BREAKFAST. 90 tablet 0   ketoconazole  (NIZORAL ) 2 % cream Apply 1 Application topically 2 (two) times daily. (Patient taking differently: Apply 1 Application topically 2 (two) times daily as needed for irritation.) 60 g 11   Multiple Vitamin (MULTIVITAMIN PO) Take 1 tablet by mouth daily.     NITROSTAT  0.4 MG SL tablet PLACE 1 TABLET UNDER THE TONGUE EVERY 5 MINUTES X 3 DOSES AS NEEDED FOR CHEST PAIN. 25 tablet 0   pantoprazole  (PROTONIX ) 40 MG tablet Take 1 tablet (40 mg total) by mouth daily at 6 (six) AM. 90 tablet 3   ranolazine  (RANEXA ) 500 MG 12 hr tablet Take 500 mg by mouth 2 (two) times daily. (Patient taking differently: Take 500-1,000 mg by mouth See admin instructions. Take 500mg  (1 tablet) by mouth in the mornings and 1000mg  (2 tablets) in the evening.)     tadalafil (CIALIS) 20 MG tablet Take 10 mg by mouth daily as needed for erectile dysfunction. (Patient taking differently: Take 5 mg by mouth daily as needed for erectile dysfunction.)     traZODone  (DESYREL ) 50 MG tablet Take 25 mg by mouth at bedtime as needed for sleep.     valsartan  (DIOVAN ) 320 MG tablet TAKE 1 TABLET BY MOUTH EVERY DAY 90 tablet 2   No current facility-administered medications for this visit.     Physical Exam    VS:  BP 120/70 (BP Location: Left Arm, Patient Position: Sitting, Cuff Size: Large)   Pulse (!) 57   Ht 5' 9 (1.753 m)   Wt 226 lb 4 oz (102.6 kg)   SpO2 99%   BMI 33.41 kg/m  , BMI Body mass index is 33.41 kg/m.           Cardiac Rehabilitation Eligibility Assessment  The patient is ready to start cardiac rehabilitation from a cardiac standpoint.   GEN: Well nourished, well developed, in no acute distress. HEENT: normal. Neck: Supple, no JVD, carotid bruits, or masses. Cardiac: RRR, no murmurs, rubs, or gallops. No clubbing, cyanosis. Trace pretibial edema in left leg. Radials 2+/PT 2+ and equal bilaterally.  Right groin cath site without bleeding, bruit, hematoma. Respiratory:  Respirations regular and unlabored, clear to auscultation bilaterally. GI: Soft, nontender, nondistended, BS + x 4. MS: no deformity or atrophy. Skin: warm and dry, no rash. Neuro:  Strength and sensation are intact. Psych: Normal affect.  Accessory Clinical Findings    ECG personally reviewed by me today - EKG Interpretation Date/Time:  Friday May 20 2024 13:30:19 EDT Ventricular Rate:  57 PR Interval:  186 QRS Duration:  102 QT Interval:  432 QTC Calculation: 420 R Axis:   -25  Text Interpretation: Sinus bradycardia Inferior infarct , age undetermined Confirmed by Vivienne Bruckner [phone]) on 05/20/2024 1:38:54 PM  - no acute changes.  Lab Results  Component Value Date   WBC 5.4 04/08/2024   HGB 13.2 04/08/2024   HCT 39.4 04/08/2024   MCV 96 04/08/2024   PLT 230 04/08/2024   Lab Results  Component Value Date   CREATININE 1.16 04/08/2024   BUN 20 04/08/2024   NA 132 (L) 04/08/2024   K 4.3 04/08/2024   CL 98 04/08/2024   CO2 20 04/08/2024   Lab Results  Component Value Date   ALT 28 11/09/2023   AST 25 11/09/2023   ALKPHOS 79 11/09/2023   BILITOT 0.6 11/09/2023   Lab Results  Component Value Date   CHOL 149 11/06/2022   HDL 46 11/06/2022  LDLCALC 74 11/06/2022   TRIG 169 (H) 11/06/2022   CHOLHDL 3.2 11/06/2022    Lab Results  Component Value Date   HGBA1C 5.9 (H) 11/09/2023   Lab Results  Component Value Date   TSH 2.190 11/09/2023       Assessment & Plan    1.  CAD Long  history of coronary artery disease status post initial bare-metal stenting in 1999 followed by multiple PCI's and subsequently CABG x 4 following inferior STEMI in August 2013. He had recurrent inferior STEMI in November 2014 and required extensive stenting in the vein graft to the RPDA/PL. Most recent cath in 04/2024, in the setting of unstable angina, showed significant 99% mid left circumflex obstruction, patent LIMA to LAD, patent SVG to RCA (50% prox stenosis), occluded SVG to OM/diagonal. S/p drug-eluting stent to mid left circumflex. DAPT as well as continued monitoring of the in-stent restenosis of the SVG to RCA was recommended.  Patient reports switching from tadalafil as needed to tadalafil daily; cautioned about use of nitrates in conjunction with PDE 5 inhibitors.  Continue aspirin , statin, beta-blocker, Plavix , ARB, Praluent, and Ranexa . Pt is interested in cardiac rehab and thinks he will participate.  2.  Primary hypertension Blood pressure normal at 120/70 today.   Continue beta-blocker and ARB  3.  Hyperlipidemia Previously on Zetia  but this was discontinued after LDL dropped to 33 following initiation of Praluent (labs followed at [facility]).  Continue atorvastatin  and Praluent.  4.  Palpitations/PSVT Quiescent on beta-blocker therapy.   5.  Disposition Follow-up in cardiology clinic in approximately 1 month or sooner if needed.  Lonni Meager, NP 05/20/2024, 4:15 PM

## 2024-05-20 NOTE — Patient Instructions (Signed)
 Medication Instructions:  No changes *If you need a refill on your cardiac medications before your next appointment, please call your pharmacy*  Lab Work: None ordered If you have labs (blood work) drawn today and your tests are completely normal, you will receive your results only by: MyChart Message (if you have MyChart) OR A paper copy in the mail If you have any lab test that is abnormal or we need to change your treatment, we will call you to review the results.  Testing/Procedures: None ordered  Follow-Up: At [facility], you and your health needs are our priority.  As part of our continuing mission to provide you with exceptional heart care, our providers are all part of one team.  This team includes your primary Cardiologist (physician) and Advanced Practice Providers or APPs (Physician Assistants and Nurse Practitioners) who all work together to provide you with the care you need, when you need it.  Your next appointment:   1 month(s)  Provider:   You may see Deatrice Cage, MD or one of the following Advanced Practice Providers on your designated Care Team:   Lonni Meager, NP  We recommend signing up for the patient portal called MyChart.  Sign up information is provided on this After Visit Summary.  MyChart is used to connect with patients for Virtual Visits (Telemedicine).  Patients are able to view lab/test results, encounter notes, upcoming appointments, etc.  Non-urgent messages can be sent to your provider as well.   To learn more about what you can do with MyChart, go to [email or web address].

## 2024-06-16 ENCOUNTER — Ambulatory Visit

## 2024-06-16 DIAGNOSIS — L570 Actinic keratosis: Secondary | ICD-10-CM

## 2024-06-16 DIAGNOSIS — L578 Other skin changes due to chronic exposure to nonionizing radiation: Secondary | ICD-10-CM | POA: Diagnosis not present

## 2024-06-16 DIAGNOSIS — L219 Seborrheic dermatitis, unspecified: Secondary | ICD-10-CM | POA: Diagnosis not present

## 2024-06-16 DIAGNOSIS — W908XXA Exposure to other nonionizing radiation, initial encounter: Secondary | ICD-10-CM

## 2024-06-16 MED ORDER — HYDROCORTISONE 2.5 % EX CREA: TOPICAL_CREAM | CUTANEOUS | 2 refills | Status: AC

## 2024-06-16 MED ORDER — KETOCONAZOLE 2 % EX CREA: TOPICAL_CREAM | CUTANEOUS | 5 refills | Status: AC

## 2024-06-16 NOTE — Patient Instructions (Signed)

## 2024-06-16 NOTE — Progress Notes (Signed)
    Subjective   Jonathan Escobar is a 78 y.o. male who presents for the following: Rash. Patient is established patient   Today patient reports: Peeling and redness at face x3 weeks no itching, started using Vanicream and has helped x3 days. Recent increase of tadalafil, but has discontinued it. HxBCC.   This patient is accompanied in the office by his spouse, Inocente.  Review of Systems:    No other skin or systemic complaints except as noted in HPI or Assessment and Plan.  The following portions of the chart were reviewed this encounter and updated as appropriate: medications, allergies, medical history  Relevant Medical History:  Personal history of non melanoma skin cancer - see medical history for full details   Objective  Well appearing patient in no apparent distress; mood and affect are within normal limits. Examination was performed of the: Focused Exam of: Face   Examination notable for: Actinic Damage/Elastosis: chronic sun damage: dyspigmentation, telangiectasia, and wrinkling, Actinic keratosis: Scaly erythematous macule(s) concentrated on sun exposed areas   Erythema and scale on cheeks and eyebrows  Examination limited by: Undergarments, Shoes or socks , Clothing, and Patient deferred removal       Assessment & Plan   Seborrheic dermatitis Chronic and persistent condition with duration or expected duration over one year. Condition is symptomatic and bothersome to patient. Patient is flaring and not currently at treatment goal.  - Discussed diagnosis, typical course, and treatment options for this condition - Explained to the patient the chronic nature of this diagnosis - Start ketoconazole  2% cream. Apply to affected area of skin twice daily - Start hydrocortisone  2.5% cream. Apply 1 gram twice daily to affected areas of skin. Stop once resolved and restart as needed for flares. Avoid use on face, armpits, groin unless otherwise indicated.  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 This is a chronic with expected duration over 1 year. A portion of actinic keratoses will progress to squamous cell carcinoma of the skin. It is not possible to reliably predict which spots will progress to skin cancer and so treatment is recommended to prevent development of skin cancer. - Reviewed sun avoidance practices, including protective clothing and hat, midday sun avoidance, sunscreen SPF>30 applications Q2-3H when in sun especially after sweating - Reviewed signs/symptoms of skin cancer, patient asked come in sooner if these appear. - Discussed treatment options - After discusison of options for treatment including risks, benefits and alternatives, the patient elected to treat with a course of Efudex  to be applied to affected areas  - Start efudex  5% / calcipotriene cream (5-fluorouracil ) twice daily for 3-5 days (patient does have at already) - Discussed the longer the product is used the more effective it is - Educated on the risk of redness, irritation, pain. Advised to hold treatment if developing side effects.  - Wash hands after use - Advised sun protection and avoidance  Level of service outlined above   Procedures, orders, diagnosis for this visit:    There are no diagnoses linked to this encounter.  Return to clinic: Return for PRN.  I, Jacquelynn V. Wilfred, CMA, am acting as scribe for Lauraine JAYSON Kanaris, MD .   Documentation: I have reviewed the above documentation for accuracy and completeness, and I agree with the above.  Lauraine JAYSON Kanaris, MD

## 2024-06-21 ENCOUNTER — Ambulatory Visit: Attending: Cardiovascular Disease | Admitting: Cardiovascular Disease

## 2024-06-21 ENCOUNTER — Encounter: Payer: Self-pay | Admitting: Cardiovascular Disease

## 2024-06-21 VITALS — BP 144/90 | HR 63 | Ht 69.0 in | Wt 224.4 lb

## 2024-06-21 DIAGNOSIS — I25718 Atherosclerosis of autologous vein coronary artery bypass graft(s) with other forms of angina pectoris: Secondary | ICD-10-CM | POA: Diagnosis not present

## 2024-06-21 DIAGNOSIS — R002 Palpitations: Secondary | ICD-10-CM

## 2024-06-21 DIAGNOSIS — I10 Essential (primary) hypertension: Secondary | ICD-10-CM | POA: Diagnosis not present

## 2024-06-21 DIAGNOSIS — E785 Hyperlipidemia, unspecified: Secondary | ICD-10-CM

## 2024-06-21 NOTE — Patient Instructions (Signed)
 Medication Instructions:  No changes *If you need a refill on your cardiac medications before your next appointment, please call your pharmacy*  Lab Work: Your provider would like for you to have the following labs today: CMET and Lipid  If you have labs (blood work) drawn today and your tests are completely normal, you will receive your results only by: MyChart Message (if you have MyChart) OR A paper copy in the mail If you have any lab test that is abnormal or we need to change your treatment, we will call you to review the results.  Testing/Procedures: None ordered  Follow-Up: At [facility], you and your health needs are our priority.  As part of our continuing mission to provide you with exceptional heart care, our providers are all part of one team.  This team includes your primary Cardiologist (physician) and Advanced Practice Providers or APPs (Physician Assistants and Nurse Practitioners) who all work together to provide you with the care you need, when you need it.  Your next appointment:   6 month(s)  Provider:   You may see Deatrice Cage, MD or one of the following Advanced Practice Providers on your designated Care Team:   Lonni Meager, NP Lesley Maffucci, PA-C Bernardino Bring, PA-C Cadence Oak Level, PA-C Tylene Lunch, NP Barnie Hila, NP    We recommend signing up for the patient portal called MyChart.  Sign up information is provided on this After Visit Summary.  MyChart is used to connect with patients for Virtual Visits (Telemedicine).  Patients are able to view lab/test results, encounter notes, upcoming appointments, etc.  Non-urgent messages can be sent to your provider as well.   To learn more about what you can do with MyChart, go to [email or web address].

## 2024-06-21 NOTE — Progress Notes (Signed)
 Cardiology Office Note   Date:  06/21/2024   ID:  Jonathan, Escobar 07/18/1946, MRN [id number]  PCP:  Justus Leita DEL, MD  Cardiologist:   Deatrice Cage, MD   Chief Complaint  Patient presents with   Follow-up    Post cath pt would like to discuss lab work. Meds reviewed verbally with pt.      History of Present Illness: Jonathan Escobar is a 78 y.o. male who presents for a follow up visit regarding coronary artery disease. He is a former CT surgical PA with extensive cardiac history:  Initial cardiac event was in 1999 with bare-metal stent to the proximal RCA -> had brachytherapy in 2000 and 2003 then redo PCI with a Taxus DES in 2005 for ISR. Inferior STEMI 04/25/2012 - 95% ISR followed by 5% stenosis post stent with lesions in the PL and PDA. Also noted 70% mid LAD; after aspiration thrombectomy, balloon angioplasty performed and he was sent for urgent CABG  (LIMA-LAD, SVG-PDA and PLA, SVG-Diag)  Inferior STEMI in November 2014 with occluded SVG-PDA --> extensive PCI (full metal jacket) of SVG followed by staged PCI of anastomotic SVG-diagonal lesion. No cardiac events since then.   He is known to have short runs of SVT and PACs.   He has known history of essential hypertension with poor tolerance to medications.  He tried small dose amlodipine  and small dose hydrochlorothiazide  but did not feel well with both medications.  He had worsening angina earlier this year. He underwent a treadmill nuclear stress test and was able to exercise for 6 minutes.  He had 1 mm of ST depression on EKG.  Nuclear imaging showed moderate size ischemia in the basal to mid inferolateral region.  Cardiac catheterization in March of 2024 showed patent LIMA to LAD, occluded SVG to diagonal and patent SVG to right PDA/PL.  The SVG to the RCA is known to be a full metal jacket from the ostium all the way distally.  Minimal restenosis was noted in the mid to distal area but there was 40% eccentric  restenosis in the proximal stent.  There was severe disease in the right posterior AV groove and PL branches as well.  I recommended medical therapy and started Ranexa  500 mg twice daily.  He had recurrent severe angina in August of this year.  Repeat cardiac catheterization was performed which showed stable disease with the exception of plaque rupture in the mid left circumflex.  This was treated successfully with PCI and drug-eluting stent placement.  He reports resolution of angina since then.  He has been doing very well.  He reports mild swelling on the left leg at the end of the day.  Past Medical History:  Diagnosis Date   CAD (coronary artery disease), autologous vein bypass graft 07/2013   Inf STEMI - PCI to SVG-RPDA:    CAD in native artery - prior Inferior MI - RCA PCI; 04/2012 - Inferior STEMI - RCA occluded (unable to dilate lesion adequately) + LAD & D1 disease --> referred for CABG 04/07/2012   CAD S/P percutaneous coronary angioplasty 1999   BMS to prox RCA 1999; Brachytherapy ~2003, followed by DES  PCI (Taxus) for ISR.   Coronary stent restenosis due to progression of disease 2000; 2005   (While Still In [place] ) Status post Brachytherapy to RCA ISR - 2000; Redo PCI with DES 2005   Diverticulitis    Essential hypertension    CONTROLLED ON MEDS   GERD (  gastroesophageal reflux disease)    Grade I diastolic dysfunction    History of GI bleed     while on Plavix    History of viral meningitis    following GI bleed    History of: ST elevation myocardial infarction (STEMI) of inferior wall, subsequent episode of care 04/25/2012   RCA: 95% ISR followed by 100% stenosis post-stent with significant RPL lesions; LAD: Tandem 80 and 70% lesions involving D1 (1, 1, 1) --> initial angioplasty to RCA aspiration thrombectomy --> urgent CABG;   Hyperlipidemia LDL goal <70    Non-recurrent unilateral inguinal hernia without obstruction or gangrene    Obesity (BMI 30.0-34.9)    Plantar  fasciitis    left heel   Right inguinal hernia 07/02/2016   S/P CABG x 4: LIMA-LAD, seq SVG-PD-PLA; SVG-diag. 04/28/2012   S/P coronary artery stent placement Nov-Dec 2014   a) Inf STEMIL 11/28/'14 --> extensive (full metal Jacket) of SVG-RPDA Promus DES - 3 mm x 38 mm, 3 mm x 38 mm, 3 mm x 20 mm, 2.75 mm x 38 mm -- all postdilated to ~3.3 mm;; b) 12/1/'14: staged PCI to distal SVG-diagonal: Xience Xpedition 2.5 mm x 20 mm (2.66 mm)   S/P drug eluting coronary stent placement 04/2024   a. 04/2024 PCI:  LM min irregs< LAD 90p/m, 133m, 40d, D2 100, LCX 99p/d (2.75x30 Onyx Frontier DES), RCA 80ost/p, 100p/m, RPAV1 60, RPAV2 90, VG-.D2 100, VG->RPDA 50ost/p ISR, 10p/d ISR, Y graft to RPAV ok, LIMA->LAD ok.   ST elevation myocardial infarction (STEMI) of inferior wall, initial episode of care - s/p PTCA of 95% ISR to 70% and 100% distal stent occlusion to ~40% (8/25) 04/25/2012   ST elevation myocardial infarction (STEMI) of inferior wall, subsequent episode of care (HCC) 07/29/2013   Diffuse near occlusion of SVG-rPDA, 90% SVG-Diag   STEMI 07/29/13- successful complex full metal jacket stenting of the SVG-PDA/PLA -07/29/2013, staged SVG-Dx PCI 12/01 07/29/2013   Supraventricular tachycardia    Trigger finger, left little finger     Past Surgical History:  Procedure Laterality Date   CARDIAC CATHETERIZATION  04/28/12   RCA: 95% ISR followed by 100% stenosis post-stent with significant RPL lesions; LAD: Tandem 80 and 70% lesions involving D1 (1, 1, 1) --> initial angioplasty to RCA aspiration thrombectomy --> urgent CABG;   CATARACT EXTRACTION W/PHACO Right 07/21/2023   Procedure: CATARACT EXTRACTION PHACO AND INTRAOCULAR LENS PLACEMENT (IOC) RIGHT  CLAREON VIVITY TORIC  6.70  00:40.5;  Surgeon: Jaye Fallow, MD;  Location: [facility];  Service: Ophthalmology;  Laterality: Right;   CATARACT EXTRACTION W/PHACO Left 08/11/2023   Procedure: CATARACT EXTRACTION PHACO AND INTRAOCULAR LENS  PLACEMENT (IOC) LEFT CLAREON VIVITY TORIC  5.16  00:38.5;  Surgeon: Jaye Fallow, MD;  Location: [facility];  Service: Ophthalmology;  Laterality: Left;   CORONARY ARTERY BYPASS GRAFT  04/28/2012   Procedure: CORONARY ARTERY BYPASS GRAFTING (CABG);  Surgeon: Dorise MARLA Fellers, MD;  Location: [facility] OR;  Service: Open Heart Surgery;  Laterality: N/A;   CORONARY STENT INTERVENTION N/A 04/15/2024   Procedure: CORONARY STENT INTERVENTION;  Surgeon: Darron Deatrice LABOR, MD;  Location: [facility] IN[facility] CV LAB;  Service: Cardiovascular;  Laterality: N/A;   HERNIA REPAIR Left    INGUINAL HERNIA REPAIR Right 08/06/2016   Procedure: HERNIA REPAIR INGUINAL ADULT;  Surgeon: Carlin Pastel, MD;  Location: [facility];  Service: General;  Laterality: Right;   LEFT AND RIGHT HEART CATHETERIZATION WITH CORONARY ANGIOGRAM N/A 04/25/2012   Procedure: LEFT  AND RIGHT HEART CATHETERIZATION WITH CORONARY ANGIOGRAM;  Surgeon: Alm LELON Clay, MD;  Location: [facility] C[facility] LAB;  Service: Cardiovascular;  Laterality: N/A;   LEFT HEART CATH AND CORONARY ANGIOGRAPHY Left 11/25/2022   Procedure: LEFT HEART CATH AND CORONARY ANGIOGRAPHY;  Surgeon: Darron Deatrice LABOR, MD;  Location: [facility] CV [facility];  Service: Cardiovascular;  Laterality: Left;   LEFT HEART CATH AND CORS/GRAFTS ANGIOGRAPHY Left 04/15/2024   Procedure: LEFT HEART CATH AND CORS/GRAFTS ANGIOGRAPHY;  Surgeon: Darron Deatrice LABOR, MD;  Location: [facility];  Service: Cardiovascular;  Laterality: Left;   LEFT HEART CATHETERIZATION WITH CORONARY/GRAFT ANGIOGRAM  07/29/2013   Procedure: LEFT HEART CATHETERIZATION WITH EL BILE;  Surgeon: Ozell JONETTA Fell, MD;  Location: [facility] CATH LAB;  Service: Cardiovascular; ; 95% stenosis with percent mid occlusion of SVG-RPDA; 90% anastomotic SVG-diagonal, moderate 50% disease in the native circumflex with patent OM 1 OM 2. 80-90% proximal LAD with patent LIMA to LAD , EF 50%   MOHS SURGERY     FOREHEAD   NM  MYOVIEW  LTD  Jan 2015   Moderate Inferior infarct (as expected), but no ischemia & EF ~45-50%   PERCUTANEOUS CORONARY STENT INTERVENTION (PCI-S)  07/29/2013   Procedure: PERCUTANEOUS CORONARY STENT INTERVENTION (PCI-S);  Surgeon: Ozell JONETTA Fell, MD;  Location: [facility] CATH LAB;  Service: Cardiovascular;; PCI to SVG-RPDA: Promus DES - 3 mm x 38 mm, 3 mm x 38 mm, 3 mm x 20 mm, 2.75 mm x 38 mm -- all postdilated to ~3.3 mm;   PERCUTANEOUS CORONARY STENT INTERVENTION (PCI-S) N/A 08/01/2013   Procedure: PERCUTANEOUS CORONARY STENT INTERVENTION (PCI-S);  Surgeon: Alm LELON Clay, MD;  Location: [facility] CATH LAB;  Service: Cardiovascular;staged PCI to distal SVG-diagonal: Xience Xpedition 2.5 mm x 20 mm (2.66 mm)     TRANSTHORACIC ECHOCARDIOGRAM  12/'13; 07/30/2013   a) Post CABG: EF 50-55%, moderate HK of anteroseptal wall, septal dyskinesis/dyssynergy due to poststernotomy;  grade 1 diastolic dysfunction. Mildly dilated LA, mildly reduced RV function-likely due to septal dyssynergy.; b) 11/'14 Post Inferior STEMI: EF 50-55% mild LVH. Mild hypokinesis of the distal lateral, and basal to mid inferior lateral and inferior walls -- consistent with RCA infarct     Current Outpatient Medications  Medication Sig Dispense Refill   acetaminophen  (TYLENOL ) 325 MG tablet Take 2 tablets (650 mg total) by mouth every 4 (four) hours as needed for headache or mild pain.     Alirocumab (PRALUENT) 75 MG/ML SOAJ Inject 75 mg into the skin every 14 (fourteen) days.     Ascorbic Acid  (VITAMIN C ) 1000 MG tablet Take 1,000 mg by mouth daily.     aspirin  81 MG tablet Take 81 mg by mouth daily.     atorvastatin  (LIPITOR ) 20 MG tablet Take 1 tablet (20 mg total) by mouth daily.     carvedilol  (COREG ) 6.25 MG tablet Take 6.25-12.5 mg by mouth See admin instructions. Take 6.25mg  (1 tablet) by mouth in the morning and 12.5mg  (2 tablets) in the evening.     cetirizine (ZYRTEC) 10 MG tablet Take 10 mg by mouth daily. PM     clopidogrel   (PLAVIX ) 75 MG tablet TAKE 1 TABLET (75 MG TOTAL) BY MOUTH DAILY WITH BREAKFAST. 90 tablet 0   hydrocortisone  2.5 % cream Apply 1 gram twice daily to affected areas of skin. Stop once resolved and restart as needed for flares. Avoid use on face, armpits, groin unless otherwise indicated. 60 g 2   ketoconazole  (NIZORAL ) 2 % cream Apply  1 Application topically 2 (two) times daily. (Patient taking differently: Apply 1 Application topically 2 (two) times daily as needed for irritation.) 60 g 11   Multiple Vitamin (MULTIVITAMIN PO) Take 1 tablet by mouth daily.     NITROSTAT  0.4 MG SL tablet PLACE 1 TABLET UNDER THE TONGUE EVERY 5 MINUTES X 3 DOSES AS NEEDED FOR CHEST PAIN. 25 tablet 0   pantoprazole  (PROTONIX ) 40 MG tablet Take 1 tablet (40 mg total) by mouth daily at 6 (six) AM. 90 tablet 3   ranolazine  (RANEXA ) 500 MG 12 hr tablet Take 500 mg by mouth 2 (two) times daily. (Patient taking differently: Take 500-1,000 mg by mouth See admin instructions. Take 500mg  (1 tablet) by mouth in the mornings and 1000mg  (2 tablets) in the evening.)     tadalafil (CIALIS) 20 MG tablet Take 10 mg by mouth daily as needed for erectile dysfunction. (Patient taking differently: Take 5 mg by mouth daily as needed for erectile dysfunction.)     traZODone  (DESYREL ) 50 MG tablet Take 25 mg by mouth at bedtime as needed for sleep.     valsartan  (DIOVAN ) 320 MG tablet TAKE 1 TABLET BY MOUTH EVERY DAY 90 tablet 2   ketoconazole  (NIZORAL ) 2 % cream Apply to affected area of skin twice daily (Patient not taking: Reported on 06/21/2024) 30 g 5   No current facility-administered medications for this visit.    Allergies:   Norvasc  [amlodipine  besylate], Ace inhibitors, and Crestor  [rosuvastatin ]    Social History:  The patient  reports that he quit smoking about 47 years ago. His smoking use included cigarettes. He started smoking about 57 years ago. He has a 10 pack-year smoking history. He has never used smokeless tobacco. He  reports current alcohol use of about 14.0 standard drinks of alcohol per week. He reports that he does not use drugs.   Family History:  The patient's family history includes Alzheimer's disease in his mother; Coronary artery disease in his father; Heart failure in his father; Valvular heart disease in his sister.    ROS:  Please see the history of present illness.   Otherwise, review of systems are positive for none.   All other systems are reviewed and negative.    PHYSICAL EXAM: VS:  BP (!) 144/90 (BP Location: Left Arm, Patient Position: Sitting, Cuff Size: Large)   Pulse 63   Ht 5' 9 (1.753 m)   Wt 224 lb 6 oz (101.8 kg)   SpO2 98%   BMI 33.13 kg/m  , BMI Body mass index is 33.13 kg/m. GEN: Well nourished, well developed, in no acute distress  HEENT: normal  Neck: no JVD,  or masses. Faint bilateral carotid bruits Cardiac: RRR; no murmurs, rubs, or gallops,no edema  Respiratory:  clear to auscultation bilaterally, normal work of breathing GI: soft, nontender, nondistended, + BS MS: no deformity or atrophy  Skin: warm and dry, no rash Neuro:  Strength and sensation are intact Psych: euthymic mood, full affect   EKG:  EKG is  ordered today. EKG showed:  Sinus rhythm with Premature atrial complexes Left axis deviation Inferior infarct (cited on or before 20-May-2024) When compared with ECG of 20-May-2024 13:30, Premature atrial complexes are now Present Nonspecific T wave abnormality now evident in Anterior leads    Recent Labs: 11/09/2023: ALT 28; TSH 2.190 04/08/2024: BUN 20; Creatinine, Ser 1.16; Hemoglobin 13.2; Platelets 230; Potassium 4.3; Sodium 132    Lipid Panel    Component Value Date/Time  CHOL 149 11/06/2022 1127   CHOL 153 09/07/2012 1201   TRIG 169 (H) 11/06/2022 1127   TRIG 185 09/07/2012 1201   HDL 46 11/06/2022 1127   HDL 31 (L) 09/07/2012 1201   CHOLHDL 3.2 11/06/2022 1127   CHOLHDL 2.9 03/12/2020 0920   VLDL 20 03/12/2020 0920   VLDL 37  09/07/2012 1201   LDLCALC 74 11/06/2022 1127   LDLCALC 85 09/07/2012 1201      Wt Readings from Last 3 Encounters:  06/21/24 224 lb 6 oz (101.8 kg)  05/20/24 226 lb 4 oz (102.6 kg)  04/15/24 221 lb 3.2 oz (100.3 kg)          No data to display            ASSESSMENT AND PLAN:  1. Coronary artery disease involving bypass graft with stable class II angina: Continue long-term dual antiplatelet therapy. His symptoms are now well-controlled especially with most recent left circumflex PCI.  The restenosis in the SVG to RCA is still moderate and we will continue to monitor for now.  2. Palpitations: These were thought to be due to short runs of SVT as well as PACs.  His symptoms are reasonably controlled with carvedilol .     3. Hyperlipidemia: He is doing extremely well with Praluent and atorvastatin .  I requested follow-up labs today.  4. Essential hypertension: Blood pressure is mildly elevated but his blood pressure was in the normal range last month during his follow-up visit.  Continue to monitor for now we can consider increasing the dose of carvedilol  if needed.   Disposition:   FU with me in 6 months  Signed,  Deatrice Cage, MD  06/21/2024 1:56 PM    [facility]

## 2024-06-22 LAB — COMPREHENSIVE METABOLIC PANEL WITH GFR
ALT: 28 IU/L (ref 0–44)
AST: 22 IU/L (ref 0–40)
Albumin: 4.1 g/dL (ref 3.8–4.8)
Alkaline Phosphatase: 82 IU/L (ref 47–123)
BUN/Creatinine Ratio: 12 (ref 10–24)
BUN: 13 mg/dL (ref 8–27)
Bilirubin Total: 0.6 mg/dL (ref 0.0–1.2)
CO2: 21 mmol/L (ref 20–29)
Calcium: 9.2 mg/dL (ref 8.6–10.2)
Chloride: 101 mmol/L (ref 96–106)
Creatinine, Ser: 1.05 mg/dL (ref 0.76–1.27)
Globulin, Total: 2.8 g/dL (ref 1.5–4.5)
Glucose: 107 mg/dL — ABNORMAL HIGH (ref 70–99)
Potassium: 4 mmol/L (ref 3.5–5.2)
Sodium: 136 mmol/L (ref 134–144)
Total Protein: 6.9 g/dL (ref 6.0–8.5)
eGFR: 73 mL/min/1.73 (ref >59)

## 2024-06-22 LAB — LIPID PANEL
Chol/HDL Ratio: 2.6 ratio (ref 0.0–5.0)
Cholesterol, Total: 104 mg/dL (ref 100–199)
HDL: 40 mg/dL (ref >39)
LDL Chol Calc (NIH): 35 mg/dL (ref 0–99)
Triglycerides: 178 mg/dL — ABNORMAL HIGH (ref 0–149)
VLDL Cholesterol Cal: 29 mg/dL (ref 5–40)

## 2024-06-23 ENCOUNTER — Ambulatory Visit: Payer: Self-pay | Admitting: Cardiovascular Disease

## 2024-06-27 NOTE — Progress Notes (Addendum)
 HILLEL CARD                                          MRN: [id number]   06/27/2024   The [facility] Quality Team Specialist reviewed this patient medical record for the purposes of chart review for care gap closure. The following were reviewed: chart review for care gap closure-controlling blood pressure.  08/22/2024- no new cbp to close.  09/23/2024- no cbp to close 2025   [facility] Quality Team

## 2024-07-04 ENCOUNTER — Ambulatory Visit: Admitting: Dermatology

## 2024-07-25 ENCOUNTER — Telehealth: Payer: Self-pay | Admitting: Internal Medicine

## 2024-07-25 NOTE — Telephone Encounter (Signed)
 Copied from CRM [id number]. Topic: Medicare AWV >> Jul 25, 2024  9:45 AM Nathanel DEL wrote: Called LVM 07/25/2024 to sched AWV. Please schedule in office or virtual visit.   Nathanel Paschal; Care Guide Ambulatory Clinical Support [facility] Direct Dial: [phone]
